# Patient Record
Sex: Male | Born: 1940 | Race: Asian | Hispanic: No | Marital: Married | Smoking: Never smoker
Health system: Southern US, Community
[De-identification: ages and names within clinical notes are randomized; demographics above are authoritative.]

## PROBLEM LIST (undated history)

## (undated) DIAGNOSIS — I10 Essential (primary) hypertension: Secondary | ICD-10-CM

---

## 2016-06-23 DIAGNOSIS — Z6821 Body mass index (BMI) 21.0-21.9, adult: Secondary | ICD-10-CM | POA: Diagnosis not present

## 2016-06-23 DIAGNOSIS — J309 Allergic rhinitis, unspecified: Secondary | ICD-10-CM | POA: Diagnosis not present

## 2016-06-23 DIAGNOSIS — E1165 Type 2 diabetes mellitus with hyperglycemia: Secondary | ICD-10-CM | POA: Diagnosis not present

## 2016-06-23 DIAGNOSIS — K219 Gastro-esophageal reflux disease without esophagitis: Secondary | ICD-10-CM | POA: Diagnosis not present

## 2016-06-23 DIAGNOSIS — I10 Essential (primary) hypertension: Secondary | ICD-10-CM | POA: Diagnosis not present

## 2016-06-26 DIAGNOSIS — Z1211 Encounter for screening for malignant neoplasm of colon: Secondary | ICD-10-CM | POA: Diagnosis not present

## 2016-06-26 DIAGNOSIS — Z6821 Body mass index (BMI) 21.0-21.9, adult: Secondary | ICD-10-CM | POA: Diagnosis not present

## 2016-06-26 DIAGNOSIS — Z789 Other specified health status: Secondary | ICD-10-CM | POA: Diagnosis not present

## 2016-06-26 DIAGNOSIS — Z7189 Other specified counseling: Secondary | ICD-10-CM | POA: Diagnosis not present

## 2016-06-26 DIAGNOSIS — R5383 Other fatigue: Secondary | ICD-10-CM | POA: Diagnosis not present

## 2016-06-26 DIAGNOSIS — Z0000 Encounter for general adult medical examination without abnormal findings: Secondary | ICD-10-CM | POA: Diagnosis not present

## 2016-06-26 DIAGNOSIS — Z1389 Encounter for screening for other disorder: Secondary | ICD-10-CM | POA: Diagnosis not present

## 2016-06-26 DIAGNOSIS — Z79899 Other long term (current) drug therapy: Secondary | ICD-10-CM | POA: Diagnosis not present

## 2016-06-26 DIAGNOSIS — E7800 Pure hypercholesterolemia, unspecified: Secondary | ICD-10-CM | POA: Diagnosis not present

## 2016-06-26 DIAGNOSIS — Z125 Encounter for screening for malignant neoplasm of prostate: Secondary | ICD-10-CM | POA: Diagnosis not present

## 2016-06-26 DIAGNOSIS — Z299 Encounter for prophylactic measures, unspecified: Secondary | ICD-10-CM | POA: Diagnosis not present

## 2016-12-28 DIAGNOSIS — K219 Gastro-esophageal reflux disease without esophagitis: Secondary | ICD-10-CM | POA: Diagnosis not present

## 2016-12-28 DIAGNOSIS — Z299 Encounter for prophylactic measures, unspecified: Secondary | ICD-10-CM | POA: Diagnosis not present

## 2016-12-28 DIAGNOSIS — I10 Essential (primary) hypertension: Secondary | ICD-10-CM | POA: Diagnosis not present

## 2016-12-28 DIAGNOSIS — D509 Iron deficiency anemia, unspecified: Secondary | ICD-10-CM | POA: Diagnosis not present

## 2016-12-28 DIAGNOSIS — E1165 Type 2 diabetes mellitus with hyperglycemia: Secondary | ICD-10-CM | POA: Diagnosis not present

## 2017-04-20 DIAGNOSIS — I10 Essential (primary) hypertension: Secondary | ICD-10-CM | POA: Diagnosis not present

## 2017-04-20 DIAGNOSIS — Z299 Encounter for prophylactic measures, unspecified: Secondary | ICD-10-CM | POA: Diagnosis not present

## 2017-04-20 DIAGNOSIS — E1165 Type 2 diabetes mellitus with hyperglycemia: Secondary | ICD-10-CM | POA: Diagnosis not present

## 2017-04-20 DIAGNOSIS — Z6822 Body mass index (BMI) 22.0-22.9, adult: Secondary | ICD-10-CM | POA: Diagnosis not present

## 2017-04-20 DIAGNOSIS — K219 Gastro-esophageal reflux disease without esophagitis: Secondary | ICD-10-CM | POA: Diagnosis not present

## 2017-04-20 DIAGNOSIS — E7800 Pure hypercholesterolemia, unspecified: Secondary | ICD-10-CM | POA: Diagnosis not present

## 2017-08-28 DIAGNOSIS — Z125 Encounter for screening for malignant neoplasm of prostate: Secondary | ICD-10-CM | POA: Diagnosis not present

## 2017-08-28 DIAGNOSIS — E1165 Type 2 diabetes mellitus with hyperglycemia: Secondary | ICD-10-CM | POA: Diagnosis not present

## 2017-08-28 DIAGNOSIS — Z299 Encounter for prophylactic measures, unspecified: Secondary | ICD-10-CM | POA: Diagnosis not present

## 2017-08-28 DIAGNOSIS — Z1211 Encounter for screening for malignant neoplasm of colon: Secondary | ICD-10-CM | POA: Diagnosis not present

## 2017-08-28 DIAGNOSIS — I10 Essential (primary) hypertension: Secondary | ICD-10-CM | POA: Diagnosis not present

## 2017-08-28 DIAGNOSIS — Z7189 Other specified counseling: Secondary | ICD-10-CM | POA: Diagnosis not present

## 2017-08-28 DIAGNOSIS — Z1389 Encounter for screening for other disorder: Secondary | ICD-10-CM | POA: Diagnosis not present

## 2017-08-28 DIAGNOSIS — Z0000 Encounter for general adult medical examination without abnormal findings: Secondary | ICD-10-CM | POA: Diagnosis not present

## 2017-08-28 DIAGNOSIS — R5383 Other fatigue: Secondary | ICD-10-CM | POA: Diagnosis not present

## 2017-08-28 DIAGNOSIS — Z79899 Other long term (current) drug therapy: Secondary | ICD-10-CM | POA: Diagnosis not present

## 2017-08-28 DIAGNOSIS — Z681 Body mass index (BMI) 19 or less, adult: Secondary | ICD-10-CM | POA: Diagnosis not present

## 2017-08-28 DIAGNOSIS — E7800 Pure hypercholesterolemia, unspecified: Secondary | ICD-10-CM | POA: Diagnosis not present

## 2018-04-19 DIAGNOSIS — Z299 Encounter for prophylactic measures, unspecified: Secondary | ICD-10-CM | POA: Diagnosis not present

## 2018-04-19 DIAGNOSIS — E1165 Type 2 diabetes mellitus with hyperglycemia: Secondary | ICD-10-CM | POA: Diagnosis not present

## 2018-04-19 DIAGNOSIS — Z681 Body mass index (BMI) 19 or less, adult: Secondary | ICD-10-CM | POA: Diagnosis not present

## 2018-04-19 DIAGNOSIS — Z789 Other specified health status: Secondary | ICD-10-CM | POA: Diagnosis not present

## 2018-04-19 DIAGNOSIS — I10 Essential (primary) hypertension: Secondary | ICD-10-CM | POA: Diagnosis not present

## 2018-04-19 DIAGNOSIS — J069 Acute upper respiratory infection, unspecified: Secondary | ICD-10-CM | POA: Diagnosis not present

## 2018-09-02 DIAGNOSIS — Z1211 Encounter for screening for malignant neoplasm of colon: Secondary | ICD-10-CM | POA: Diagnosis not present

## 2018-09-02 DIAGNOSIS — Z1331 Encounter for screening for depression: Secondary | ICD-10-CM | POA: Diagnosis not present

## 2018-09-02 DIAGNOSIS — Z299 Encounter for prophylactic measures, unspecified: Secondary | ICD-10-CM | POA: Diagnosis not present

## 2018-09-02 DIAGNOSIS — Z789 Other specified health status: Secondary | ICD-10-CM | POA: Diagnosis not present

## 2018-09-02 DIAGNOSIS — E7800 Pure hypercholesterolemia, unspecified: Secondary | ICD-10-CM | POA: Diagnosis not present

## 2018-09-02 DIAGNOSIS — Z1339 Encounter for screening examination for other mental health and behavioral disorders: Secondary | ICD-10-CM | POA: Diagnosis not present

## 2018-09-02 DIAGNOSIS — Z7189 Other specified counseling: Secondary | ICD-10-CM | POA: Diagnosis not present

## 2018-09-02 DIAGNOSIS — I10 Essential (primary) hypertension: Secondary | ICD-10-CM | POA: Diagnosis not present

## 2018-09-02 DIAGNOSIS — Z0000 Encounter for general adult medical examination without abnormal findings: Secondary | ICD-10-CM | POA: Diagnosis not present

## 2018-09-02 DIAGNOSIS — Z6821 Body mass index (BMI) 21.0-21.9, adult: Secondary | ICD-10-CM | POA: Diagnosis not present

## 2018-09-02 DIAGNOSIS — R5383 Other fatigue: Secondary | ICD-10-CM | POA: Diagnosis not present

## 2018-09-02 DIAGNOSIS — E1165 Type 2 diabetes mellitus with hyperglycemia: Secondary | ICD-10-CM | POA: Diagnosis not present

## 2018-09-04 DIAGNOSIS — Z299 Encounter for prophylactic measures, unspecified: Secondary | ICD-10-CM | POA: Diagnosis not present

## 2018-09-04 DIAGNOSIS — Z6820 Body mass index (BMI) 20.0-20.9, adult: Secondary | ICD-10-CM | POA: Diagnosis not present

## 2018-09-04 DIAGNOSIS — I10 Essential (primary) hypertension: Secondary | ICD-10-CM | POA: Diagnosis not present

## 2018-09-04 DIAGNOSIS — Z713 Dietary counseling and surveillance: Secondary | ICD-10-CM | POA: Diagnosis not present

## 2018-10-15 DIAGNOSIS — I10 Essential (primary) hypertension: Secondary | ICD-10-CM | POA: Diagnosis not present

## 2018-10-15 DIAGNOSIS — Z6820 Body mass index (BMI) 20.0-20.9, adult: Secondary | ICD-10-CM | POA: Diagnosis not present

## 2018-10-15 DIAGNOSIS — D509 Iron deficiency anemia, unspecified: Secondary | ICD-10-CM | POA: Diagnosis not present

## 2018-10-15 DIAGNOSIS — Z299 Encounter for prophylactic measures, unspecified: Secondary | ICD-10-CM | POA: Diagnosis not present

## 2018-10-15 DIAGNOSIS — E1165 Type 2 diabetes mellitus with hyperglycemia: Secondary | ICD-10-CM | POA: Diagnosis not present

## 2019-03-03 DIAGNOSIS — Z299 Encounter for prophylactic measures, unspecified: Secondary | ICD-10-CM | POA: Diagnosis not present

## 2019-03-03 DIAGNOSIS — R0600 Dyspnea, unspecified: Secondary | ICD-10-CM | POA: Diagnosis not present

## 2019-03-03 DIAGNOSIS — I10 Essential (primary) hypertension: Secondary | ICD-10-CM | POA: Diagnosis not present

## 2019-03-03 DIAGNOSIS — Z6821 Body mass index (BMI) 21.0-21.9, adult: Secondary | ICD-10-CM | POA: Diagnosis not present

## 2019-03-03 DIAGNOSIS — E1165 Type 2 diabetes mellitus with hyperglycemia: Secondary | ICD-10-CM | POA: Diagnosis not present

## 2019-03-03 DIAGNOSIS — R05 Cough: Secondary | ICD-10-CM | POA: Diagnosis not present

## 2019-03-10 DIAGNOSIS — R0602 Shortness of breath: Secondary | ICD-10-CM | POA: Diagnosis not present

## 2019-09-10 DIAGNOSIS — Z1339 Encounter for screening examination for other mental health and behavioral disorders: Secondary | ICD-10-CM | POA: Diagnosis not present

## 2019-09-10 DIAGNOSIS — I10 Essential (primary) hypertension: Secondary | ICD-10-CM | POA: Diagnosis not present

## 2019-09-10 DIAGNOSIS — Z0000 Encounter for general adult medical examination without abnormal findings: Secondary | ICD-10-CM | POA: Diagnosis not present

## 2019-09-10 DIAGNOSIS — R5383 Other fatigue: Secondary | ICD-10-CM | POA: Diagnosis not present

## 2019-09-10 DIAGNOSIS — Z7189 Other specified counseling: Secondary | ICD-10-CM | POA: Diagnosis not present

## 2019-09-10 DIAGNOSIS — Z6821 Body mass index (BMI) 21.0-21.9, adult: Secondary | ICD-10-CM | POA: Diagnosis not present

## 2019-09-10 DIAGNOSIS — Z125 Encounter for screening for malignant neoplasm of prostate: Secondary | ICD-10-CM | POA: Diagnosis not present

## 2019-09-10 DIAGNOSIS — E1165 Type 2 diabetes mellitus with hyperglycemia: Secondary | ICD-10-CM | POA: Diagnosis not present

## 2019-09-10 DIAGNOSIS — Z299 Encounter for prophylactic measures, unspecified: Secondary | ICD-10-CM | POA: Diagnosis not present

## 2019-09-10 DIAGNOSIS — E7800 Pure hypercholesterolemia, unspecified: Secondary | ICD-10-CM | POA: Diagnosis not present

## 2019-09-10 DIAGNOSIS — Z1331 Encounter for screening for depression: Secondary | ICD-10-CM | POA: Diagnosis not present

## 2019-09-10 DIAGNOSIS — Z1211 Encounter for screening for malignant neoplasm of colon: Secondary | ICD-10-CM | POA: Diagnosis not present

## 2019-09-10 DIAGNOSIS — Z79899 Other long term (current) drug therapy: Secondary | ICD-10-CM | POA: Diagnosis not present

## 2019-10-22 DIAGNOSIS — I10 Essential (primary) hypertension: Secondary | ICD-10-CM | POA: Diagnosis not present

## 2019-12-25 DIAGNOSIS — I10 Essential (primary) hypertension: Secondary | ICD-10-CM | POA: Diagnosis not present

## 2020-01-15 DIAGNOSIS — Z299 Encounter for prophylactic measures, unspecified: Secondary | ICD-10-CM | POA: Diagnosis not present

## 2020-01-15 DIAGNOSIS — R05 Cough: Secondary | ICD-10-CM | POA: Diagnosis not present

## 2020-01-15 DIAGNOSIS — Z6821 Body mass index (BMI) 21.0-21.9, adult: Secondary | ICD-10-CM | POA: Diagnosis not present

## 2020-01-15 DIAGNOSIS — Z20822 Contact with and (suspected) exposure to covid-19: Secondary | ICD-10-CM | POA: Diagnosis not present

## 2020-01-15 DIAGNOSIS — Z713 Dietary counseling and surveillance: Secondary | ICD-10-CM | POA: Diagnosis not present

## 2020-01-17 ENCOUNTER — Other Ambulatory Visit: Payer: Self-pay | Admitting: Physician Assistant

## 2020-01-17 ENCOUNTER — Telehealth: Payer: Self-pay | Admitting: Physician Assistant

## 2020-01-17 DIAGNOSIS — I10 Essential (primary) hypertension: Secondary | ICD-10-CM

## 2020-01-17 DIAGNOSIS — U071 COVID-19: Secondary | ICD-10-CM

## 2020-01-17 NOTE — Telephone Encounter (Signed)
  I connected by phone with Bruce Webb on 01/17/2020 at 3:22 PM to discuss the potential use of an new treatment for mild to moderate COVID-19 viral infection in non-hospitalized patients.  This patient is a 79 y.o. male that meets the FDA criteria for Emergency Use Authorization of bamlanivimab or casirivimab\imdevimab.  Has a (+) direct SARS-CoV-2 viral test result  Has mild or moderate COVID-19   Is ? 79 years of age and weighs ? 40 kg  Is NOT hospitalized due to COVID-19  Is NOT requiring oxygen therapy or requiring an increase in baseline oxygen flow rate due to COVID-19  Is within 10 days of symptom onset  Has at least one of the high risk factor(s) for progression to severe COVID-19 and/or hospitalization as defined in EUA.  Specific high risk criteria : >/= 78 yo  Hx of HTN   I have spoken and communicated the following to the patient or parent/caregiver:  1. FDA has authorized the emergency use of bamlanivimab and casirivimab\imdevimab for the treatment of mild to moderate COVID-19 in adults and pediatric patients with positive results of direct SARS-CoV-2 viral testing who are 14 years of age and older weighing at least 40 kg, and who are at high risk for progressing to severe COVID-19 and/or hospitalization.  2. The significant known and potential risks and benefits of bamlanivimab and casirivimab\imdevimab, and the extent to which such potential risks and benefits are unknown.  3. Information on available alternative treatments and the risks and benefits of those alternatives, including clinical trials.  4. Patients treated with bamlanivimab and casirivimab\imdevimab should continue to self-isolate and use infection control measures (e.g., wear mask, isolate, social distance, avoid sharing personal items, clean and disinfect "high touch" surfaces, and frequent handwashing) according to CDC guidelines.   5. The patient or parent/caregiver has the option to accept or  refuse bamlanivimab or casirivimab\imdevimab .  After reviewing this information with the patient, The patient agreed to proceed with receiving the bamlanimivab infusion and will be provided a copy of the Fact sheet prior to receiving the infusion.Bruce Webb 01/17/2020 3:22 PM

## 2020-01-19 ENCOUNTER — Ambulatory Visit (HOSPITAL_COMMUNITY)
Admission: RE | Admit: 2020-01-19 | Discharge: 2020-01-19 | Disposition: A | Discharge status: Home / Self Care | Payer: Medicare Other | Source: Ambulatory Visit | Attending: Pulmonary Disease | Admitting: Pulmonary Disease

## 2020-01-19 DIAGNOSIS — U071 COVID-19: Secondary | ICD-10-CM | POA: Insufficient documentation

## 2020-01-19 DIAGNOSIS — Z23 Encounter for immunization: Secondary | ICD-10-CM | POA: Insufficient documentation

## 2020-01-19 MED ORDER — ALBUTEROL SULFATE HFA 108 (90 BASE) MCG/ACT IN AERS: 2.0000 | INHALATION_SPRAY | Freq: Once | RESPIRATORY_TRACT | Status: DC | PRN

## 2020-01-19 MED ORDER — SODIUM CHLORIDE 0.9 % IV SOLN
700.0000 mg | Freq: Once | INTRAVENOUS | Status: AC
  Administered 2020-01-19: 700 mg via INTRAVENOUS
  Filled 2020-01-19: qty 20

## 2020-01-19 MED ORDER — FAMOTIDINE IN NACL 20-0.9 MG/50ML-% IV SOLN: 20.0000 mg | Freq: Once | INTRAVENOUS | Status: DC | PRN

## 2020-01-19 MED ORDER — SODIUM CHLORIDE 0.9 % IV SOLN
INTRAVENOUS | Status: DC | PRN
  Administered 2020-01-19: 250 mL via INTRAVENOUS

## 2020-01-19 MED ORDER — DIPHENHYDRAMINE HCL 50 MG/ML IJ SOLN: 50.0000 mg | Freq: Once | INTRAMUSCULAR | Status: DC | PRN

## 2020-01-19 MED ORDER — METHYLPREDNISOLONE SODIUM SUCC 125 MG IJ SOLR: 125.0000 mg | Freq: Once | INTRAMUSCULAR | Status: DC | PRN

## 2020-01-19 MED ORDER — EPINEPHRINE 0.3 MG/0.3ML IJ SOAJ: 0.3000 mg | Freq: Once | INTRAMUSCULAR | Status: DC | PRN

## 2020-01-19 NOTE — Progress Notes (Signed)
  Diagnosis: COVID-19  Physician: Dr. Delford Field  Procedure: [facility] Med: bamlanivimab infusion - Provided patient with bamlanimivab fact sheet for patients, parents and caregivers prior to infusion.  Complications: No immediate complications noted.  Discharge: Discharged home   Bruce Webb 01/19/2020

## 2020-01-19 NOTE — Discharge Instructions (Signed)
COVID-19 COVID-19 is a respiratory infection that is caused by a virus called severe acute respiratory syndrome coronavirus 2 (SARS-CoV-2). The disease is also known as coronavirus disease or novel coronavirus. In some people, the virus may not cause any symptoms. In others, it may cause a serious infection. The infection can get worse quickly and can lead to complications, such as:  Pneumonia, or infection of the lungs.  Acute respiratory distress syndrome or ARDS. This is a condition in which fluid build-up in the lungs prevents the lungs from filling with air and passing oxygen into the blood.  Acute respiratory failure. This is a condition in which there is not enough oxygen passing from the lungs to the body or when carbon dioxide is not passing from the lungs out of the body.  Sepsis or septic shock. This is a serious bodily reaction to an infection.  Blood clotting problems.  Secondary infections due to bacteria or fungus.  Organ failure. This is when your body's organs stop working. The virus that causes COVID-19 is contagious. This means that it can spread from person to person through droplets from coughs and sneezes (respiratory secretions). What are the causes? This illness is caused by a virus. You may catch the virus by:  Breathing in droplets from an infected person. Droplets can be spread by a person breathing, speaking, singing, coughing, or sneezing.  Touching something, like a table or a doorknob, that was exposed to the virus (contaminated) and then touching your mouth, nose, or eyes. What increases the risk? Risk for infection You are more likely to be infected with this virus if you:  Are within 6 feet (2 meters) of a person with COVID-19.  Provide care for or live with a person who is infected with COVID-19.  Spend time in crowded indoor spaces or live in shared housing. Risk for serious illness You are more likely to become seriously ill from the virus if  you:  Are 50 years of age or older. The higher your age, the more you are at risk for serious illness.  Live in a nursing home or long-term care facility.  Have cancer.  Have a long-term (chronic) disease such as: ? Chronic lung disease, including chronic obstructive pulmonary disease or asthma. ? A long-term disease that lowers your body's ability to fight infection (immunocompromised). ? Heart disease, including heart failure, a condition in which the arteries that lead to the heart become narrow or blocked (coronary artery disease), a disease which makes the heart muscle thick, weak, or stiff (cardiomyopathy). ? Diabetes. ? Chronic kidney disease. ? Sickle cell disease, a condition in which red blood cells have an abnormal "sickle" shape. ? Liver disease.  Are obese. What are the signs or symptoms? Symptoms of this condition can range from mild to severe. Symptoms may appear any time from 2 to 14 days after being exposed to the virus. They include:  A fever or chills.  A cough.  Difficulty breathing.  Headaches, body aches, or muscle aches.  Runny or stuffy (congested) nose.  A sore throat.  New loss of taste or smell. Some people may also have stomach problems, such as nausea, vomiting, or diarrhea. Other people may not have any symptoms of COVID-19. How is this diagnosed? This condition may be diagnosed based on:  Your signs and symptoms, especially if: ? You live in an area with a COVID-19 outbreak. ? You recently traveled to or from an area where the virus is common. ? You   provide care for or live with a person who was diagnosed with COVID-19. ? You were exposed to a person who was diagnosed with COVID-19.  A physical exam.  Lab tests, which may include: ? Taking a sample of fluid from the back of your nose and throat (nasopharyngeal fluid), your nose, or your throat using a swab. ? A sample of mucus from your lungs (sputum). ? Blood tests.  Imaging tests,  which may include, X-rays, CT scan, or ultrasound. How is this treated? At present, there is no medicine to treat COVID-19. Medicines that treat other diseases are being used on a trial basis to see if they are effective against COVID-19. Your health care provider will talk with you about ways to treat your symptoms. For most people, the infection is mild and can be managed at home with rest, fluids, and over-the-counter medicines. Treatment for a serious infection usually takes places in a hospital intensive care unit (ICU). It may include one or more of the following treatments. These treatments are given until your symptoms improve.  Receiving fluids and medicines through an IV.  Supplemental oxygen. Extra oxygen is given through a tube in the nose, a face mask, or a hood.  Positioning you to lie on your stomach (prone position). This makes it easier for oxygen to get into the lungs.  Continuous positive airway pressure (CPAP) or bi-level positive airway pressure (BPAP) machine. This treatment uses mild air pressure to keep the airways open. A tube that is connected to a motor delivers oxygen to the body.  Ventilator. This treatment moves air into and out of the lungs by using a tube that is placed in your windpipe.  Tracheostomy. This is a procedure to create a hole in the neck so that a breathing tube can be inserted.  Extracorporeal membrane oxygenation (ECMO). This procedure gives the lungs a chance to recover by taking over the functions of the heart and lungs. It supplies oxygen to the body and removes carbon dioxide. Follow these instructions at home: Lifestyle  If you are sick, stay home except to get medical care. Your health care provider will tell you how long to stay home. Call your health care provider before you go for medical care.  Rest at home as told by your health care provider.  Do not use any products that contain nicotine or tobacco, such as cigarettes,  e-cigarettes, and chewing tobacco. If you need help quitting, ask your health care provider.  Return to your normal activities as told by your health care provider. Ask your health care provider what activities are safe for you. General instructions  Take over-the-counter and prescription medicines only as told by your health care provider.  Drink enough fluid to keep your urine pale yellow.  Keep all follow-up visits as told by your health care provider. This is important. How is this prevented?  There is no vaccine to help prevent COVID-19 infection. However, there are steps you can take to protect yourself and others from this virus. To protect yourself:   Do not travel to areas where COVID-19 is a risk. The areas where COVID-19 is reported change often. To identify high-risk areas and travel restrictions, check the CDC travel website: wwwnc.cdc.gov/travel/notices  If you live in, or must travel to, an area where COVID-19 is a risk, take precautions to avoid infection. ? Stay away from people who are sick. ? Wash your hands often with soap and water for 20 seconds. If soap and water   are not available, use an alcohol-based hand sanitizer. ? Avoid touching your mouth, face, eyes, or nose. ? Avoid going out in public, follow guidance from your state and local health authorities. ? If you must go out in public, wear a cloth face covering or face mask. Make sure your mask covers your nose and mouth. ? Avoid crowded indoor spaces. Stay at least 6 feet (2 meters) away from others. ? Disinfect objects and surfaces that are frequently touched every day. This may include:  Counters and tables.  Doorknobs and light switches.  Sinks and faucets.  Electronics, such as phones, remote controls, keyboards, computers, and tablets. To protect others: If you have symptoms of COVID-19, take steps to prevent the virus from spreading to others.  If you think you have a COVID-19 infection, contact  your health care provider right away. Tell your health care team that you think you may have a COVID-19 infection.  Stay home. Leave your house only to seek medical care. Do not use public transport.  Do not travel while you are sick.  Wash your hands often with soap and water for 20 seconds. If soap and water are not available, use alcohol-based hand sanitizer.  Stay away from other members of your household. Let healthy household members care for children and pets, if possible. If you have to care for children or pets, wash your hands often and wear a mask. If possible, stay in your own room, separate from others. Use a different bathroom.  Make sure that all people in your household wash their hands well and often.  Cough or sneeze into a tissue or your sleeve or elbow. Do not cough or sneeze into your hand or into the air.  Wear a cloth face covering or face mask. Make sure your mask covers your nose and mouth. Where to find more information  Centers for Disease Control and Prevention: www.cdc.gov/coronavirus/2019-ncov/index.html  World Health Organization: www.who.int/health-topics/coronavirus Contact a health care provider if:  You live in or have traveled to an area where COVID-19 is a risk and you have symptoms of the infection.  You have had contact with someone who has COVID-19 and you have symptoms of the infection. Get help right away if:  You have trouble breathing.  You have pain or pressure in your chest.  You have confusion.  You have bluish lips and fingernails.  You have difficulty waking from sleep.  You have symptoms that get worse. These symptoms may represent a serious problem that is an emergency. Do not wait to see if the symptoms will go away. Get medical help right away. Call your local emergency services (911 in the [place] Do not drive yourself to the hospital. Let the emergency medical personnel know if you think you have  COVID-19. Summary  COVID-19 is a respiratory infection that is caused by a virus. It is also known as coronavirus disease or novel coronavirus. It can cause serious infections, such as pneumonia, acute respiratory distress syndrome, acute respiratory failure, or sepsis.  The virus that causes COVID-19 is contagious. This means that it can spread from person to person through droplets from breathing, speaking, singing, coughing, or sneezing.  You are more likely to develop a serious illness if you are 50 years of age or older, have a weak immune system, live in a nursing home, or have chronic disease.  There is no medicine to treat COVID-19. Your health care provider will talk with you about ways to treat your symptoms.    Take steps to protect yourself and others from infection. Wash your hands often and disinfect objects and surfaces that are frequently touched every day. Stay away from people who are sick and wear a mask if you are sick. This information is not intended to replace advice given to you by your health care provider. Make sure you discuss any questions you have with your health care provider. Document Revised: 10/10/2019 Document Reviewed: 01/16/2019 Elsevier Patient Education  2020 Elsevier Inc. What types of side effects do monoclonal antibody drugs cause?  Common side effects  In general, the more common side effects caused by monoclonal antibody drugs include: . Allergic reactions, such as hives or itching . Flu-like signs and symptoms, including chills, fatigue, fever, and muscle aches and pains . Nausea, vomiting . Diarrhea . Skin rashes . Low blood pressure   The CDC is recommending patients who receive monoclonal antibody treatments wait at least 90 days before being vaccinated.  Currently, there are no data on the safety and efficacy of mRNA COVID-19 vaccines in persons who received monoclonal antibodies or convalescent plasma as part of COVID-19 treatment. Based  on the estimated half-life of such therapies as well as evidence suggesting that reinfection is uncommon in the 90 days after initial infection, vaccination should be deferred for at least 90 days, as a precautionary measure until additional information becomes available, to avoid interference of the antibody treatment with vaccine-induced immune responses. 

## 2020-01-20 DIAGNOSIS — I10 Essential (primary) hypertension: Secondary | ICD-10-CM | POA: Diagnosis not present

## 2020-01-20 DIAGNOSIS — Z299 Encounter for prophylactic measures, unspecified: Secondary | ICD-10-CM | POA: Diagnosis not present

## 2020-01-20 DIAGNOSIS — U071 COVID-19: Secondary | ICD-10-CM | POA: Diagnosis not present

## 2020-01-20 DIAGNOSIS — Z6821 Body mass index (BMI) 21.0-21.9, adult: Secondary | ICD-10-CM | POA: Diagnosis not present

## 2020-01-20 DIAGNOSIS — E7800 Pure hypercholesterolemia, unspecified: Secondary | ICD-10-CM | POA: Diagnosis not present

## 2020-01-20 DIAGNOSIS — E1165 Type 2 diabetes mellitus with hyperglycemia: Secondary | ICD-10-CM | POA: Diagnosis not present

## 2020-01-21 ENCOUNTER — Ambulatory Visit (HOSPITAL_COMMUNITY): Payer: Medicare Other

## 2020-01-21 ENCOUNTER — Ambulatory Visit (HOSPITAL_COMMUNITY): Payer: Self-pay

## 2020-01-21 DIAGNOSIS — I10 Essential (primary) hypertension: Secondary | ICD-10-CM | POA: Diagnosis not present

## 2020-01-26 ENCOUNTER — Other Ambulatory Visit: Payer: Self-pay

## 2020-01-26 ENCOUNTER — Encounter (HOSPITAL_COMMUNITY): Payer: Self-pay

## 2020-01-26 ENCOUNTER — Emergency Department (HOSPITAL_COMMUNITY): Payer: Medicare Other

## 2020-01-26 ENCOUNTER — Inpatient Hospital Stay (HOSPITAL_COMMUNITY)
Admission: EM | Admit: 2020-01-26 | Discharge: 2020-01-31 | DRG: 177 | Disposition: A | Discharge status: Home / Self Care | Payer: Medicare Other | Attending: Internal Medicine | Admitting: Internal Medicine

## 2020-01-26 DIAGNOSIS — J9601 Acute respiratory failure with hypoxia: Secondary | ICD-10-CM

## 2020-01-26 DIAGNOSIS — U071 COVID-19: Secondary | ICD-10-CM | POA: Diagnosis not present

## 2020-01-26 DIAGNOSIS — E119 Type 2 diabetes mellitus without complications: Secondary | ICD-10-CM

## 2020-01-26 DIAGNOSIS — I10 Essential (primary) hypertension: Secondary | ICD-10-CM | POA: Diagnosis not present

## 2020-01-26 DIAGNOSIS — I2699 Other pulmonary embolism without acute cor pulmonale: Secondary | ICD-10-CM | POA: Diagnosis not present

## 2020-01-26 DIAGNOSIS — J1282 Pneumonia due to coronavirus disease 2019: Secondary | ICD-10-CM | POA: Diagnosis present

## 2020-01-26 DIAGNOSIS — K5900 Constipation, unspecified: Secondary | ICD-10-CM | POA: Diagnosis present

## 2020-01-26 DIAGNOSIS — Z79899 Other long term (current) drug therapy: Secondary | ICD-10-CM

## 2020-01-26 DIAGNOSIS — E611 Iron deficiency: Secondary | ICD-10-CM | POA: Diagnosis present

## 2020-01-26 DIAGNOSIS — E1165 Type 2 diabetes mellitus with hyperglycemia: Secondary | ICD-10-CM | POA: Diagnosis present

## 2020-01-26 DIAGNOSIS — D519 Vitamin B12 deficiency anemia, unspecified: Secondary | ICD-10-CM | POA: Diagnosis present

## 2020-01-26 DIAGNOSIS — R0602 Shortness of breath: Secondary | ICD-10-CM | POA: Diagnosis not present

## 2020-01-26 HISTORY — DX: Essential (primary) hypertension: I10

## 2020-01-26 LAB — CBC WITH DIFFERENTIAL/PLATELET
Abs Immature Granulocytes: 0.46 10*3/uL — ABNORMAL HIGH (ref 0.00–0.07)
Basophils Absolute: 0 10*3/uL (ref 0.0–0.1)
Basophils Relative: 0 %
Eosinophils Absolute: 0 10*3/uL (ref 0.0–0.5)
Eosinophils Relative: 0 %
HCT: 29.9 % — ABNORMAL LOW (ref 39.0–52.0)
Hemoglobin: 9.3 g/dL — ABNORMAL LOW (ref 13.0–17.0)
Immature Granulocytes: 3 %
Lymphocytes Relative: 4 %
Lymphs Abs: 0.6 10*3/uL — ABNORMAL LOW (ref 0.7–4.0)
MCH: 27.2 pg (ref 26.0–34.0)
MCHC: 31.1 g/dL (ref 30.0–36.0)
MCV: 87.4 fL (ref 80.0–100.0)
Monocytes Absolute: 1.1 10*3/uL — ABNORMAL HIGH (ref 0.1–1.0)
Monocytes Relative: 8 %
Neutro Abs: 11.9 10*3/uL — ABNORMAL HIGH (ref 1.7–7.7)
Neutrophils Relative %: 85 %
Platelets: 151 10*3/uL (ref 150–400)
RBC: 3.42 MIL/uL — ABNORMAL LOW (ref 4.22–5.81)
RDW: 14.3 % (ref 11.5–15.5)
WBC: 14.1 10*3/uL — ABNORMAL HIGH (ref 4.0–10.5)
nRBC: 0 % (ref 0.0–0.2)

## 2020-01-26 LAB — COMPREHENSIVE METABOLIC PANEL
ALT: 57 U/L — ABNORMAL HIGH (ref 0–44)
AST: 37 U/L (ref 15–41)
Albumin: 3 g/dL — ABNORMAL LOW (ref 3.5–5.0)
Alkaline Phosphatase: 79 U/L (ref 38–126)
Anion gap: 9 (ref 5–15)
BUN: 30 mg/dL — ABNORMAL HIGH (ref 8–23)
CO2: 24 mmol/L (ref 22–32)
Calcium: 8.6 mg/dL — ABNORMAL LOW (ref 8.9–10.3)
Chloride: 105 mmol/L (ref 98–111)
Creatinine, Ser: 1.07 mg/dL (ref 0.61–1.24)
GFR calc Af Amer: >60 mL/min (ref >60)
GFR calc non Af Amer: >60 mL/min (ref >60)
Glucose, Bld: 289 mg/dL — ABNORMAL HIGH (ref 70–99)
Potassium: 4.5 mmol/L (ref 3.5–5.1)
Sodium: 138 mmol/L (ref 135–145)
Total Bilirubin: 0.9 mg/dL (ref 0.3–1.2)
Total Protein: 7 g/dL (ref 6.5–8.1)

## 2020-01-26 LAB — FIBRINOGEN: Fibrinogen: 502 mg/dL — ABNORMAL HIGH (ref 210–475)

## 2020-01-26 LAB — POC SARS CORONAVIRUS 2 AG -  ED: SARS Coronavirus 2 Ag: POSITIVE — AB

## 2020-01-26 LAB — LACTATE DEHYDROGENASE: LDH: 314 U/L — ABNORMAL HIGH (ref 98–192)

## 2020-01-26 LAB — TRIGLYCERIDES: Triglycerides: 142 mg/dL (ref <150)

## 2020-01-26 MED ORDER — ACETAMINOPHEN 325 MG PO TABS
650.0000 mg | ORAL_TABLET | Freq: Once | ORAL | Status: DC
  Filled 2020-01-26 (×2): qty 2

## 2020-01-26 MED ORDER — DEXAMETHASONE SODIUM PHOSPHATE 10 MG/ML IJ SOLN
6.0000 mg | Freq: Once | INTRAMUSCULAR | Status: AC
  Administered 2020-01-26: 6 mg via INTRAVENOUS
  Filled 2020-01-26: qty 1

## 2020-01-26 MED ORDER — ALBUTEROL SULFATE HFA 108 (90 BASE) MCG/ACT IN AERS
2.0000 | INHALATION_SPRAY | Freq: Once | RESPIRATORY_TRACT | Status: AC
  Administered 2020-01-26: 2 via RESPIRATORY_TRACT
  Filled 2020-01-26: qty 6.7

## 2020-01-26 NOTE — ED Provider Notes (Signed)
[facility]-EMERGENCY DEPT Provider Note   CSN: [id number] Arrival date & time: 01/26/20  2025     History Chief Complaint  Patient presents with  . Shortness of Breath    Bruce Webb is a 79 y.o. male with a history of hypertension who was diagnosed with COVID 19 01/16/20 presents to the ED with complaints of worsening dyspnea. Patient states he has mostly had cough with fever & chills with his illness until the past few days when he has started to have progressively worsening dyspnea w/ generalized weakness. No alleviating/aggravating factors. 911 was called and noted patient to be hypoxic to 68% on RA improved w/ application of O2 via [place]. Patient denies chest pain, hemoptysis, vomiting, or diarrhea. Patient did receive monoclonal antibody infusion 01/19/2020. translator was utilized throughout Audiological scientist.  Patient son provided additional history. HPI     Past Medical History:  Diagnosis Date  . Hypertension     There are no problems to display for this patient.   History reviewed. No pertinent surgical history.     History reviewed. No pertinent family history.  Social History   Tobacco Use  . Smoking status: Not on file  Substance Use Topics  . Alcohol use: Not on file  . Drug use: Not on file    Home Medications Prior to Admission medications   Medication Sig Start Date End Date Taking? Authorizing Provider  acetaminophen (TYLENOL) 325 MG tablet Take 650 mg by mouth every 6 (six) hours as needed for mild pain or fever.   Yes [provider]  dexamethasone (DECADRON) 4 MG tablet Take 4 mg by mouth daily. x5days 01/23/20  Yes [provider]  HYDROcodone-homatropine (HYCODAN) 5-1.5 MG/5ML syrup Take 5 mLs by mouth 2 (two) times daily as needed for cough. 01/20/20  Yes [provider]  lisinopril (ZESTRIL) 10 MG tablet Take 10-20 mg by mouth See admin instructions. Take 20mg  in am & 10mg  in evening 11/25/19  Yes [provider]  metoprolol tartrate (LOPRESSOR) 50 MG tablet Take 50 mg by mouth daily.   Yes [provider]  omeprazole (PRILOSEC) 40 MG capsule Take 40 mg by mouth daily. 11/25/19  Yes [provider]    Allergies    Patient has no known allergies.  Review of Systems   Review of Systems  Constitutional: Positive for chills, fatigue and fever.  Respiratory: Positive for cough and shortness of breath.   Cardiovascular: Negative for chest pain.  Gastrointestinal: Negative for abdominal pain, diarrhea and vomiting.  Neurological: Positive for weakness (generalized).  All other systems reviewed and are negative.   Physical Exam Updated Vital Signs BP (!) 147/118   Pulse 73   Temp (!) 100.5 F (38.1 C) (Oral)   Resp (!) 21   Ht 5\' 2"  (1.575 m)   Wt 67.1 kg   SpO2 94%   BMI 27.07 kg/m   Physical Exam Vitals and nursing note reviewed.  Constitutional:      Appearance: He is ill-appearing. He is not toxic-appearing.  HENT:     Head: Normocephalic and atraumatic.  Eyes:     General:        Right eye: No discharge.        Left eye: No discharge.     Conjunctiva/sclera: Conjunctivae normal.  Cardiovascular:     Rate and Rhythm: Normal rate and regular rhythm.  Pulmonary:     Effort: Tachypnea present.     Breath sounds: No wheezing, rhonchi or  rales.     Comments: SpO2 88% on RA @ rest, improved w/ application of 2.5 L O2 via [place] Abdominal:     General: There is no distension.     Palpations: Abdomen is soft.     Tenderness: There is no abdominal tenderness.  Musculoskeletal:     Cervical back: Neck supple.     Right lower leg: No tenderness. No edema.     Left lower leg: No tenderness. No edema.  Skin:    General: Skin is warm and dry.     Findings: No rash.  Neurological:     Mental Status: He is alert.     Comments: Clear speech.   Psychiatric:        Behavior: Behavior normal.    ED Results / Procedures / Treatments   Labs (all labs  ordered are listed, but only abnormal results are displayed) Labs Reviewed  POC SARS CORONAVIRUS 2 AG -  ED - Abnormal; Notable for the following components:      Result Value   SARS Coronavirus 2 Ag POSITIVE (*)    All other components within normal limits  CULTURE, BLOOD (ROUTINE X 2)  CULTURE, BLOOD (ROUTINE X 2)  LACTIC ACID, PLASMA  LACTIC ACID, PLASMA  CBC WITH DIFFERENTIAL/PLATELET  COMPREHENSIVE METABOLIC PANEL  D-DIMER, QUANTITATIVE (NOT AT [facility])  PROCALCITONIN  LACTATE DEHYDROGENASE  FERRITIN  TRIGLYCERIDES  FIBRINOGEN  C-REACTIVE PROTEIN    EKG EKG Interpretation  Date/Time:  Monday January 26 2020 22:03:30 EST Ventricular Rate:  74 PR Interval:    QRS Duration: 93 QT Interval:  368 QTC Calculation: 409 R Axis:   17 Text Interpretation: Sinus rhythm RSR' in V1 or V2, right VCD or RVH Baseline wander in lead(s) V3 Confirmed by Virgel Manifold [phone]) on 01/26/2020 11:24:56 PM   Radiology DG Chest Port 1 View  Result Date: 01/26/2020 CLINICAL DATA:  79 year old male with shortness of breath. EXAM: PORTABLE CHEST 1 VIEW COMPARISON:  None. FINDINGS: Bilateral predominantly peripheral and subpleural interstitial coarsening and hazy densities concerning for infiltrate, possibly viral or atypical including COVID-19. Clinical correlation is recommended. No lobar consolidation, pleural effusion, or pneumothorax. The cardiac silhouette is within normal limits. No acute osseous pathology. IMPRESSION: Findings concerning for multifocal pneumonia, possibly viral or atypical in etiology. Clinical correlation and follow-up recommended. Electronically Signed   By: Anner Crete M.D.   On: 01/26/2020 22:17    Procedures .Critical Care Performed by: Amaryllis Dyke, PA-C Authorized by: Amaryllis Dyke, PA-C    CRITICAL CARE Performed by: Kennith Maes   Total critical care time: 35 minutes  Critical care time was exclusive of separately billable  procedures and treating other patients.  Critical care was necessary to treat or prevent imminent or life-threatening deterioration.  Critical care was time spent personally by me on the following activities: development of treatment plan with patient and/or surrogate as well as nursing, discussions with consultants, evaluation of patient's response to treatment, examination of patient, obtaining history from patient or surrogate, ordering and performing treatments and interventions, ordering and review of laboratory studies, ordering and review of radiographic studies, pulse oximetry and re-evaluation of patient's condition.    (including critical care time)  Medications Ordered in ED Medications  acetaminophen (TYLENOL) tablet 650 mg (0 mg Oral Hold 01/26/20 2154)  dexamethasone (DECADRON) injection 6 mg (6 mg Intravenous Given 01/26/20 2155)  albuterol (VENTOLIN HFA) 108 (90 Base) MCG/ACT inhaler 2 puff (2 puffs Inhalation Given 01/26/20 2157)  ED Course  I have reviewed the triage vital signs and the nursing notes.  Pertinent labs & imaging results that were available during my care of the patient were reviewed by me and considered in my medical decision making (see chart for details).    MDM Rules/Calculators/A&P                      Patient with known COVID-19 diagnosed 01/22 presents to the emergency department with worsening dyspnea.  He is ill-appearing, tachypneic, hypoxic on room air improved with 2.5 L of oxygen via nasal cannula,  febrile, BP somewhat elevated- doubt HTN emergency.  Lungs without obvious adventitious sounds.  Decadron, tylenol, albuterol ordered.   COVID test: Positive Chest x-ray: Multifocal pneumonia. EKG: No STEMI.  CBC: Leukocytosis @ 14.1. Anemia- no old labs on record for comparison, will need follow up.   CMP: Hyperglycemia w/o acidosis or anion gap elevation. Mildly elevated ALT. BUN elevated, creatinine WNL. Some inflammatory markers starting to  come back elevated.    Will consult hospitalist service for acute hypoxic respiratory failure in setting of COVID 19.  Patient & family updated on results & plan of care- in agreement.  Findings and plan of care discussed with supervising physician Dr. Juleen China who is in agreement.   00:18: CONSULT: Discussed with hospitalist Dr. Adela Glimpse- accepts admission.   Final Clinical Impression(s) / ED Diagnoses Final diagnoses:  COVID-19  Acute hypoxemic respiratory failure [facility])    Rx / DC Orders ED Discharge Orders    None       Desmond Lope 01/27/20 Maylon Peppers, MD 01/28/20 1510

## 2020-01-26 NOTE — ED Triage Notes (Signed)
Per EMS, patient from home and tested positive for covid 1/22 and his only symptom at the time was a cough. Covid symptoms progressed to fever, sob, however, cough has improved. Family states patient's o2 was 68%. EMS placed pt on 6L nasal cannula and o2 has been stable around 97%. Patient only speaks [place] - son was able to translate for EMS Bruce Webb [phone]).

## 2020-01-27 ENCOUNTER — Inpatient Hospital Stay (HOSPITAL_COMMUNITY): Payer: Medicare Other

## 2020-01-27 ENCOUNTER — Encounter (HOSPITAL_COMMUNITY): Payer: Self-pay | Admitting: Internal Medicine

## 2020-01-27 DIAGNOSIS — J1282 Pneumonia due to coronavirus disease 2019: Secondary | ICD-10-CM

## 2020-01-27 DIAGNOSIS — D519 Vitamin B12 deficiency anemia, unspecified: Secondary | ICD-10-CM | POA: Diagnosis present

## 2020-01-27 DIAGNOSIS — J9601 Acute respiratory failure with hypoxia: Secondary | ICD-10-CM | POA: Diagnosis present

## 2020-01-27 DIAGNOSIS — E119 Type 2 diabetes mellitus without complications: Secondary | ICD-10-CM

## 2020-01-27 DIAGNOSIS — U071 COVID-19: Principal | ICD-10-CM

## 2020-01-27 DIAGNOSIS — K5900 Constipation, unspecified: Secondary | ICD-10-CM | POA: Diagnosis present

## 2020-01-27 DIAGNOSIS — I10 Essential (primary) hypertension: Secondary | ICD-10-CM | POA: Diagnosis present

## 2020-01-27 DIAGNOSIS — J9600 Acute respiratory failure, unspecified whether with hypoxia or hypercapnia: Secondary | ICD-10-CM | POA: Diagnosis not present

## 2020-01-27 DIAGNOSIS — E1165 Type 2 diabetes mellitus with hyperglycemia: Secondary | ICD-10-CM | POA: Diagnosis present

## 2020-01-27 DIAGNOSIS — I2699 Other pulmonary embolism without acute cor pulmonale: Secondary | ICD-10-CM | POA: Diagnosis present

## 2020-01-27 DIAGNOSIS — E611 Iron deficiency: Secondary | ICD-10-CM | POA: Diagnosis present

## 2020-01-27 DIAGNOSIS — Z79899 Other long term (current) drug therapy: Secondary | ICD-10-CM | POA: Diagnosis not present

## 2020-01-27 LAB — COMPREHENSIVE METABOLIC PANEL
ALT: 45 U/L — ABNORMAL HIGH (ref 0–44)
AST: 29 U/L (ref 15–41)
Albumin: 2.6 g/dL — ABNORMAL LOW (ref 3.5–5.0)
Alkaline Phosphatase: 67 U/L (ref 38–126)
Anion gap: 9 (ref 5–15)
BUN: 29 mg/dL — ABNORMAL HIGH (ref 8–23)
CO2: 22 mmol/L (ref 22–32)
Calcium: 8 mg/dL — ABNORMAL LOW (ref 8.9–10.3)
Chloride: 104 mmol/L (ref 98–111)
Creatinine, Ser: 0.95 mg/dL (ref 0.61–1.24)
GFR calc Af Amer: >60 mL/min (ref >60)
GFR calc non Af Amer: >60 mL/min (ref >60)
Glucose, Bld: 283 mg/dL — ABNORMAL HIGH (ref 70–99)
Potassium: 4.4 mmol/L (ref 3.5–5.1)
Sodium: 135 mmol/L (ref 135–145)
Total Bilirubin: 0.5 mg/dL (ref 0.3–1.2)
Total Protein: 6.3 g/dL — ABNORMAL LOW (ref 6.5–8.1)

## 2020-01-27 LAB — TROPONIN I (HIGH SENSITIVITY)
Troponin I (High Sensitivity): 34 ng/L — ABNORMAL HIGH (ref <18)
Troponin I (High Sensitivity): 27 ng/L — ABNORMAL HIGH (ref <18)

## 2020-01-27 LAB — GLUCOSE, CAPILLARY
Glucose-Capillary: 258 mg/dL — ABNORMAL HIGH (ref 70–99)
Glucose-Capillary: 286 mg/dL — ABNORMAL HIGH (ref 70–99)
Glucose-Capillary: 318 mg/dL — ABNORMAL HIGH (ref 70–99)
Glucose-Capillary: 353 mg/dL — ABNORMAL HIGH (ref 70–99)

## 2020-01-27 LAB — RETICULOCYTES
Immature Retic Fract: 15.9 % (ref 2.3–15.9)
RBC.: 3.47 MIL/uL — ABNORMAL LOW (ref 4.22–5.81)
Retic Count, Absolute: 46.2 10*3/uL (ref 19.0–186.0)
Retic Ct Pct: 1.3 % (ref 0.4–3.1)

## 2020-01-27 LAB — CBC WITH DIFFERENTIAL/PLATELET
Abs Immature Granulocytes: 0.39 10*3/uL — ABNORMAL HIGH (ref 0.00–0.07)
Basophils Absolute: 0 10*3/uL (ref 0.0–0.1)
Basophils Relative: 0 %
Eosinophils Absolute: 0 10*3/uL (ref 0.0–0.5)
Eosinophils Relative: 0 %
HCT: 29.5 % — ABNORMAL LOW (ref 39.0–52.0)
Hemoglobin: 9.5 g/dL — ABNORMAL LOW (ref 13.0–17.0)
Immature Granulocytes: 3 %
Lymphocytes Relative: 2 %
Lymphs Abs: 0.3 10*3/uL — ABNORMAL LOW (ref 0.7–4.0)
MCH: 27.8 pg (ref 26.0–34.0)
MCHC: 32.2 g/dL (ref 30.0–36.0)
MCV: 86.3 fL (ref 80.0–100.0)
Monocytes Absolute: 0.4 10*3/uL (ref 0.1–1.0)
Monocytes Relative: 3 %
Neutro Abs: 11.3 10*3/uL — ABNORMAL HIGH (ref 1.7–7.7)
Neutrophils Relative %: 92 %
Platelets: 153 10*3/uL (ref 150–400)
RBC: 3.42 MIL/uL — ABNORMAL LOW (ref 4.22–5.81)
RDW: 14.6 % (ref 11.5–15.5)
WBC: 12.3 10*3/uL — ABNORMAL HIGH (ref 4.0–10.5)
nRBC: 0 % (ref 0.0–0.2)

## 2020-01-27 LAB — IRON AND TIBC
Iron: 17 ug/dL — ABNORMAL LOW (ref 45–182)
Saturation Ratios: 7 % — ABNORMAL LOW (ref 17.9–39.5)
TIBC: 242 ug/dL — ABNORMAL LOW (ref 250–450)
UIBC: 225 ug/dL

## 2020-01-27 LAB — LACTIC ACID, PLASMA
Lactic Acid, Venous: 2.1 mmol/L (ref 0.5–1.9)
Lactic Acid, Venous: 1.7 mmol/L (ref 0.5–1.9)

## 2020-01-27 LAB — D-DIMER, QUANTITATIVE
D-Dimer, Quant: >20 ug/mL-FEU — ABNORMAL HIGH (ref 0.00–0.50)
D-Dimer, Quant: >20 ug/mL-FEU — ABNORMAL HIGH (ref 0.00–0.50)

## 2020-01-27 LAB — PROCALCITONIN: Procalcitonin: <0.1 ng/mL

## 2020-01-27 LAB — VITAMIN B12: Vitamin B-12: 165 pg/mL — ABNORMAL LOW (ref 180–914)

## 2020-01-27 LAB — SAMPLE TO BLOOD BANK

## 2020-01-27 LAB — FERRITIN
Ferritin: 142 ng/mL (ref 24–336)
Ferritin: 134 ng/mL (ref 24–336)

## 2020-01-27 LAB — C-REACTIVE PROTEIN
CRP: 10.6 mg/dL — ABNORMAL HIGH (ref <1.0)
CRP: 14 mg/dL — ABNORMAL HIGH (ref <1.0)

## 2020-01-27 LAB — FOLATE: Folate: 13.5 ng/mL (ref >5.9)

## 2020-01-27 MED ORDER — SODIUM CHLORIDE (PF) 0.9 % IJ SOLN
INTRAMUSCULAR | Status: AC
  Filled 2020-01-27: qty 50

## 2020-01-27 MED ORDER — ENOXAPARIN SODIUM 40 MG/0.4ML ~~LOC~~ SOLN: 40.0000 mg | SUBCUTANEOUS | Status: DC

## 2020-01-27 MED ORDER — INSULIN DETEMIR 100 UNIT/ML ~~LOC~~ SOLN
0.0750 [IU]/kg | Freq: Two times a day (BID) | SUBCUTANEOUS | Status: DC
  Administered 2020-01-27 – 2020-01-31 (×9): 5 [IU] via SUBCUTANEOUS
  Filled 2020-01-27 (×10): qty 0.05

## 2020-01-27 MED ORDER — ENOXAPARIN SODIUM 80 MG/0.8ML ~~LOC~~ SOLN
65.0000 mg | Freq: Two times a day (BID) | SUBCUTANEOUS | Status: DC
  Administered 2020-01-27 – 2020-01-29 (×4): 65 mg via SUBCUTANEOUS
  Filled 2020-01-27 (×4): qty 0.8

## 2020-01-27 MED ORDER — INSULIN ASPART 100 UNIT/ML ~~LOC~~ SOLN
0.0000 [IU] | SUBCUTANEOUS | Status: DC
  Administered 2020-01-27: 09:00:00 8 [IU] via SUBCUTANEOUS
  Administered 2020-01-27: 19:00:00 11 [IU] via SUBCUTANEOUS
  Administered 2020-01-27: 12:00:00 8 [IU] via SUBCUTANEOUS
  Administered 2020-01-27: 11 [IU] via SUBCUTANEOUS
  Administered 2020-01-28: 8 [IU] via SUBCUTANEOUS
  Administered 2020-01-28: 2 [IU] via SUBCUTANEOUS
  Administered 2020-01-28: 11 [IU] via SUBCUTANEOUS
  Administered 2020-01-28 – 2020-01-29 (×2): 5 [IU] via SUBCUTANEOUS
  Administered 2020-01-29 – 2020-01-30 (×3): 8 [IU] via SUBCUTANEOUS
  Administered 2020-01-30: 5 [IU] via SUBCUTANEOUS
  Administered 2020-01-30: 01:00:00 2 [IU] via SUBCUTANEOUS
  Administered 2020-01-30: 12:00:00 5 [IU] via SUBCUTANEOUS
  Administered 2020-01-31: 13:00:00 11 [IU] via SUBCUTANEOUS

## 2020-01-27 MED ORDER — ONDANSETRON HCL 4 MG/2ML IJ SOLN: 4.0000 mg | Freq: Four times a day (QID) | INTRAMUSCULAR | Status: DC | PRN

## 2020-01-27 MED ORDER — SODIUM CHLORIDE 0.9 % IV SOLN: 100.0000 mg | Freq: Every day | INTRAVENOUS | Status: DC

## 2020-01-27 MED ORDER — ASCORBIC ACID 500 MG PO TABS
500.0000 mg | ORAL_TABLET | Freq: Every day | ORAL | Status: DC
  Administered 2020-01-27 – 2020-01-31 (×5): 500 mg via ORAL
  Filled 2020-01-27 (×5): qty 1

## 2020-01-27 MED ORDER — ENOXAPARIN SODIUM 80 MG/0.8ML ~~LOC~~ SOLN
1.0000 mg/kg | SUBCUTANEOUS | Status: AC
  Administered 2020-01-27: 09:00:00 65 mg via SUBCUTANEOUS
  Filled 2020-01-27: qty 0.8

## 2020-01-27 MED ORDER — HYDROCODONE-HOMATROPINE 5-1.5 MG/5ML PO SYRP
5.0000 mL | ORAL_SOLUTION | Freq: Two times a day (BID) | ORAL | Status: DC | PRN
  Administered 2020-01-28: 5 mL via ORAL
  Filled 2020-01-27: qty 5

## 2020-01-27 MED ORDER — ENOXAPARIN SODIUM 80 MG/0.8ML ~~LOC~~ SOLN: 65.0000 mg | SUBCUTANEOUS | Status: DC

## 2020-01-27 MED ORDER — LISINOPRIL 10 MG PO TABS
10.0000 mg | ORAL_TABLET | Freq: Every day | ORAL | Status: DC
  Administered 2020-01-27 – 2020-01-29 (×3): 10 mg via ORAL
  Filled 2020-01-27 (×5): qty 1

## 2020-01-27 MED ORDER — IOHEXOL 350 MG/ML SOLN
100.0000 mL | Freq: Once | INTRAVENOUS | Status: AC | PRN
  Administered 2020-01-27: 100 mL via INTRAVENOUS

## 2020-01-27 MED ORDER — METOPROLOL TARTRATE 25 MG PO TABS
12.5000 mg | ORAL_TABLET | Freq: Two times a day (BID) | ORAL | Status: DC
  Administered 2020-01-27 – 2020-01-30 (×7): 12.5 mg via ORAL
  Filled 2020-01-27 (×9): qty 1

## 2020-01-27 MED ORDER — HYDROCODONE-ACETAMINOPHEN 5-325 MG PO TABS: 1.0000 | ORAL_TABLET | ORAL | Status: DC | PRN

## 2020-01-27 MED ORDER — METHYLPREDNISOLONE SODIUM SUCC 40 MG IJ SOLR: 0.2500 mg/kg | Freq: Two times a day (BID) | INTRAMUSCULAR | Status: DC

## 2020-01-27 MED ORDER — SODIUM CHLORIDE 0.9 % IV SOLN: 200.0000 mg | Freq: Once | INTRAVENOUS | Status: DC

## 2020-01-27 MED ORDER — PANTOPRAZOLE SODIUM 40 MG PO TBEC
40.0000 mg | DELAYED_RELEASE_TABLET | Freq: Every day | ORAL | Status: DC
  Administered 2020-01-27 – 2020-01-31 (×5): 40 mg via ORAL
  Filled 2020-01-27 (×5): qty 1

## 2020-01-27 MED ORDER — SODIUM CHLORIDE 0.9 % IV SOLN
200.0000 mg | Freq: Once | INTRAVENOUS | Status: AC
  Administered 2020-01-27: 02:00:00 200 mg via INTRAVENOUS
  Filled 2020-01-27: qty 40

## 2020-01-27 MED ORDER — SODIUM CHLORIDE 0.9 % IV SOLN: 250.0000 mL | INTRAVENOUS | Status: DC | PRN

## 2020-01-27 MED ORDER — ZINC SULFATE 220 (50 ZN) MG PO CAPS
220.0000 mg | ORAL_CAPSULE | Freq: Every day | ORAL | Status: DC
  Administered 2020-01-27 – 2020-01-31 (×5): 220 mg via ORAL
  Filled 2020-01-27 (×5): qty 1

## 2020-01-27 MED ORDER — ACETAMINOPHEN 325 MG PO TABS: 650.0000 mg | ORAL_TABLET | Freq: Four times a day (QID) | ORAL | Status: DC | PRN

## 2020-01-27 MED ORDER — ONDANSETRON HCL 4 MG PO TABS: 4.0000 mg | ORAL_TABLET | Freq: Four times a day (QID) | ORAL | Status: DC | PRN

## 2020-01-27 MED ORDER — SODIUM CHLORIDE 0.9% FLUSH
3.0000 mL | Freq: Two times a day (BID) | INTRAVENOUS | Status: DC
  Administered 2020-01-27 – 2020-01-31 (×6): 3 mL via INTRAVENOUS

## 2020-01-27 MED ORDER — DEXAMETHASONE SODIUM PHOSPHATE 10 MG/ML IJ SOLN
6.0000 mg | Freq: Every day | INTRAMUSCULAR | Status: DC
  Administered 2020-01-27 – 2020-01-31 (×5): 6 mg via INTRAVENOUS
  Filled 2020-01-27 (×5): qty 1

## 2020-01-27 MED ORDER — SODIUM CHLORIDE 0.9% FLUSH: 3.0000 mL | INTRAVENOUS | Status: DC | PRN

## 2020-01-27 MED ORDER — CYANOCOBALAMIN 1000 MCG/ML IJ SOLN
1000.0000 ug | INTRAMUSCULAR | Status: DC
  Administered 2020-01-30: 1000 ug via INTRAMUSCULAR
  Filled 2020-01-27: qty 1

## 2020-01-27 MED ORDER — SODIUM CHLORIDE 0.9 % IV SOLN
100.0000 mg | Freq: Every day | INTRAVENOUS | Status: AC
  Administered 2020-01-28 – 2020-01-31 (×4): 100 mg via INTRAVENOUS
  Filled 2020-01-27 (×4): qty 20

## 2020-01-27 MED ORDER — SODIUM CHLORIDE 0.9% FLUSH
3.0000 mL | Freq: Two times a day (BID) | INTRAVENOUS | Status: DC
  Administered 2020-01-27 – 2020-01-31 (×9): 3 mL via INTRAVENOUS

## 2020-01-27 MED ORDER — LINAGLIPTIN 5 MG PO TABS
5.0000 mg | ORAL_TABLET | Freq: Every day | ORAL | Status: DC
  Administered 2020-01-27 – 2020-01-31 (×5): 5 mg via ORAL
  Filled 2020-01-27 (×5): qty 1

## 2020-01-27 MED ORDER — CYANOCOBALAMIN 1000 MCG/ML IJ SOLN
1000.0000 ug | Freq: Every day | INTRAMUSCULAR | Status: AC
  Administered 2020-01-27 – 2020-01-29 (×3): 1000 ug via INTRAMUSCULAR
  Filled 2020-01-27 (×3): qty 1

## 2020-01-27 NOTE — Plan of Care (Signed)
  Problem: Education: Goal: Knowledge of risk factors and measures for prevention of condition will improve Outcome: Progressing   

## 2020-01-27 NOTE — Progress Notes (Signed)
PROGRESS NOTE                                                                                                                                                                                                             Patient Demographics:    Bruce Webb, is a 79 y.o. male, DOB - 1941/10/24, EXB:[id number]  Admit date - 01/26/2020   Admitting Physician Therisa Doyne, MD  Outpatient Primary MD for the patient is Ignatius Specking, MD  LOS - 0   Chief Complaint  Patient presents with  . Shortness of Breath       Brief Narrative    This is a no charge note, it was seen and admitted earlier today by Dr. Adela Glimpse, chart, imaging, and labs were reviewed  Bruce Webb is a 79 y.o. male with medical history significant of recent Covid infection DM2, HTN, Presented with  Severe hypoxia, Initially diagnosed on 1/22 with  COVID , bamlanivimab infusion was given at [facility] on 01/23/2020 was started on steroids but continued to have worsening shortness of breath.    Noted to be hypoxic by EMS 68% on room air, was 80% on room air in ED, was admitted to [facility] for further management .   Subjective:    [name] today has complaints of some cough .    Assessment  & Plan :    Active Problems:   Pneumonia due to COVID-19 virus   Acute respiratory failure due to COVID-19 [facility])   Essential hypertension   DM (diabetes mellitus), type 2 (HCC)  Acute hypoxic respiratory failure due to COVID-19 pneumonia: -On 2.5 L nasal cannula, saturating 97%, initially 80% on room air.  Chest x-ray concerning for multifocal opacities. -Patient was encouraged with incentive spirometry, flutter valve, out of bed to chair, and to prone once back in bed. -Continue with IV Decadron. -Continue with IV remdesivir. -Patient received bamlanivimab infusion at [facility] on 01/23/2020(outpatient) -Far I see no indication for Actemra, or convalescent plasma, given his oxygen requirement, but  low threshold to give Actemra especially with elevated CRP at 14.  COVID-19 Labs  Recent Labs    01/26/20 2151 01/27/20 0419  DDIMER >20.00* >20.00*  FERRITIN 142 134  LDH 314*  --   CRP 10.6* 14.0*    No results found for: SARSCOV2NAA  Pulmonary embolism -It  was noted to have significantly elevated D-dimers, CTA chest significant for subacute PE, with no evidence of right heart strain, for now we will continue with Lovenox full treatment dose, will transition to DOAC once more stable.  Anemia/B12 deficiency -Secondary to B12 deficiency. -Noted on B12 supplements  Essential hypertension -Overall blood pressure is acceptable, I will go ahead resume on lower dose metoprolol 12.5 mg p.o. twice daily, and lower dose lisinopril as well.  Diabetes mellitus type II -Does not appear to be on any home medication, he started on Tradjenta given COVID-19 positive status, started on low-dose Levemir, and insulin sliding scale  Code Status : Full  Family Communication  : Cussed with his son, and his nephew(Bruce Webb [phone]), family want nephew to be point of contact for updates.  Disposition Plan  : Home when stable  Barriers For Discharge : he remains on IV steroids, Remdesivir, and still requiring Ixygen  Consults  :  None  Procedures  : None  DVT Prophylaxis  :  Lovenox full dose  Lab Results  Component Value Date   PLT 153 01/27/2020    Antibiotics  :    Anti-infectives (From admission, onward)   Start     Dose/Rate Route Frequency Ordered Stop   01/28/20 1000  remdesivir 100 mg in sodium chloride 0.9 % 100 mL IVPB  Status:  Discontinued     100 mg 200 mL/hr over 30 Minutes Intravenous Daily 01/27/20 0601 01/27/20 0611   01/28/20 1000  remdesivir 100 mg in sodium chloride 0.9 % 100 mL IVPB     100 mg 200 mL/hr over 30 Minutes Intravenous Daily 01/27/20 0048 02/01/20 0959   01/27/20 0601  remdesivir 200 mg in sodium chloride 0.9% 250 mL IVPB  Status:   Discontinued     200 mg 580 mL/hr over 30 Minutes Intravenous Once 01/27/20 0601 01/27/20 0611   01/27/20 0115  remdesivir 200 mg in sodium chloride 0.9% 250 mL IVPB     200 mg 580 mL/hr over 30 Minutes Intravenous Once 01/27/20 0048 01/27/20 0242        Objective:   Vitals:   01/27/20 0400 01/27/20 0500 01/27/20 0600 01/27/20 0718  BP: 134/73   (!) 146/68  Pulse: 61 (!) 49 (!) 49 89  Resp: 20 16 19 20   Temp:    97.8 F (36.6 C)  TempSrc:    Oral  SpO2: 96% 97% 96% 94%  Weight:      Height:        Wt Readings from Last 3 Encounters:  01/26/20 67.1 kg     Intake/Output Summary (Last 24 hours) at 01/27/2020 1141 Last data filed at 01/27/2020 1127 Gross per 24 hour  Intake 250 ml  Output 200 ml  Net 50 ml     Physical Exam  Awake Alert, Oriented X 3, No new F.N deficits, Normal affect Symmetrical Chest wall movement, Good air movement bilaterally, CTAB RRR,No Gallops,Rubs or new Murmurs, No Parasternal Heave +ve B.Sounds, Abd Soft, No tenderness, No rebound - guarding or rigidity. No Cyanosis, Clubbing or edema, No new Rash or bruise      Data Review:    CBC Recent Labs  Lab 01/26/20 2151 01/27/20 0419  WBC 14.1* 12.3*  HGB 9.3* 9.5*  HCT 29.9* 29.5*  PLT 151 153  MCV 87.4 86.3  MCH 27.2 27.8  MCHC 31.1 32.2  RDW 14.3 14.6  LYMPHSABS 0.6* 0.3*  MONOABS 1.1* 0.4  EOSABS 0.0 0.0  BASOSABS 0.0 0.0  Chemistries  Recent Labs  Lab 01/26/20 2151 01/27/20 0419  NA 138 135  K 4.5 4.4  CL 105 104  CO2 24 22  GLUCOSE 289* 283*  BUN 30* 29*  CREATININE 1.07 0.95  CALCIUM 8.6* 8.0*  AST 37 29  ALT 57* 45*  ALKPHOS 79 67  BILITOT 0.9 0.5   ------------------------------------------------------------------------------------------------------------------ Recent Labs    01/26/20 2151  TRIG 142    No results found for: HGBA1C ------------------------------------------------------------------------------------------------------------------ No  results for input(s): TSH, T4TOTAL, T3FREE, THYROIDAB in the last 72 hours.  Invalid input(s): FREET3 ------------------------------------------------------------------------------------------------------------------ Recent Labs    01/26/20 2151 01/27/20 0419  VITAMINB12  --  165*  FOLATE  --  13.5  FERRITIN 142 134  TIBC  --  242*  IRON  --  17*  RETICCTPCT  --  1.3    Coagulation profile No results for input(s): INR, PROTIME in the last 168 hours.  Recent Labs    01/26/20 2151 01/27/20 0419  DDIMER >20.00* >20.00*    Cardiac Enzymes No results for input(s): CKMB, TROPONINI, MYOGLOBIN in the last 168 hours.  Invalid input(s): CK ------------------------------------------------------------------------------------------------------------------ No results found for: BNP  Inpatient Medications  Scheduled Meds: . acetaminophen  650 mg Oral Once  . vitamin C  500 mg Oral Daily  . cyanocobalamin  1,000 mcg Intramuscular Daily   Followed by  . [START ON 01/30/2020] cyanocobalamin  1,000 mcg Intramuscular Weekly  . dexamethasone (DECADRON) injection  6 mg Intravenous Daily  . enoxaparin (LOVENOX) injection  65 mg Subcutaneous Q12H  . insulin aspart  0-15 Units Subcutaneous Q4H  . insulin detemir  0.075 Units/kg Subcutaneous BID  . linagliptin  5 mg Oral Daily  . pantoprazole  40 mg Oral Daily  . sodium chloride (PF)      . sodium chloride flush  3 mL Intravenous Q12H  . sodium chloride flush  3 mL Intravenous Q12H  . zinc sulfate  220 mg Oral Daily   Continuous Infusions: . sodium chloride    . [START ON 01/28/2020] remdesivir 100 mg in NS 100 mL     PRN Meds:.sodium chloride, acetaminophen, HYDROcodone-acetaminophen, HYDROcodone-homatropine, ondansetron **OR** ondansetron (ZOFRAN) IV, sodium chloride flush  Micro Results Recent Results (from the past 240 hour(s))  Blood Culture (routine x 2)     Status: None (Preliminary result)   Collection Time: 01/26/20  9:51 PM     Specimen: BLOOD  Result Value Ref Range Status   Specimen Description BLOOD RIGHT ANTECUBITAL  Final   Special Requests   Final    BOTTLES DRAWN AEROBIC AND ANAEROBIC Blood Culture adequate volume Performed at [facility], [address], [place], [place] [address]    Culture NO GROWTH < 12 HOURS  Final   Report Status PENDING  Incomplete  Blood Culture (routine x 2)     Status: None (Preliminary result)   Collection Time: 01/27/20  1:18 AM   Specimen: BLOOD  Result Value Ref Range Status   Specimen Description BLOOD BLOOD LEFT FOREARM  Final   Special Requests   Final    BOTTLES DRAWN AEROBIC AND ANAEROBIC Blood Culture results may not be optimal due to an excessive volume of blood received in culture bottles Performed at [facility], [address] [place], [place] [address]    Culture NO GROWTH <12 HOURS  Final   Report Status PENDING  Incomplete    Radiology Reports CT ANGIO CHEST PE W OR WO CONTRAST  Result Date: 01/27/2020  CLINICAL DATA:  COVID-19 positivity with increased cough EXAM: CT ANGIOGRAPHY CHEST WITH CONTRAST TECHNIQUE: Multidetector CT imaging of the chest was performed using the standard protocol during bolus administration of intravenous contrast. Multiplanar CT image reconstructions and MIPs were obtained to evaluate the vascular anatomy. CONTRAST:  OMNIPAQUE IOHEXOL 350 MG/ML SOLN COMPARISON:  None. FINDINGS: Cardiovascular: Thoracic aorta is within normal limits within normal branching pattern. No cardiac enlargement is seen. Scattered small filling defects are noted within the pulmonary artery on the right. No right heart strain is noted. 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 Mediastinum/Nodes: Thoracic inlet is within normal limits. No hilar or mediastinal adenopathy is noted. The esophagus as visualized is within limits. Lungs/Pleura: Lungs are well aerated bilaterally with predominantly  peripheral ground-glass opacities consistent with given clinical history of COVID-19 positivity trees no effusion is seen. No parenchymal nodules noted. Upper Abdomen: Visualized upper abdomen is within normal limits. Musculoskeletal: Degenerative changes of the thoracic spine are seen. No acute bony abnormality is noted. Review of the MIP images confirms the above findings. 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 No right heart strain is noted. Bilateral ground-glass opacities consistent with the given clinical history of COVID-19 positivity. Electronically Signed   By: Alcide Clever M.D.   On: 01/27/2020 02:14   DG Chest Port 1 View  Result Date: 01/26/2020 CLINICAL DATA:  79 year old male with shortness of breath. EXAM: PORTABLE CHEST 1 VIEW COMPARISON:  None. FINDINGS: Bilateral predominantly peripheral and subpleural interstitial coarsening and hazy densities concerning for infiltrate, possibly viral or atypical including COVID-19. Clinical correlation is recommended. No lobar consolidation, pleural effusion, or pneumothorax. The cardiac silhouette is within normal limits. No acute osseous pathology. IMPRESSION: Findings concerning for multifocal pneumonia, possibly viral or atypical in etiology. Clinical correlation and follow-up recommended. Electronically Signed   By: Elgie Collard M.D.   On: 01/26/2020 22:17     Huey Bienenstock M.D on 01/27/2020 at 11:41 AM  Between 7am to 7pm - Pager - [phone]  After 7pm go to [email or web address] - password [facility]  [facility] -  Office  [phone]

## 2020-01-27 NOTE — Progress Notes (Signed)
ANTICOAGULATION CONSULT NOTE - Follow Up Consult  Pharmacy Consult for Lovenox Indication: pulmonary embolus  No Known Allergies  Patient Measurements: Height: 5\' 2"  (157.5 cm) Weight: 148 lb (67.1 kg) IBW/kg (Calculated) : 54.6   Vital Signs: Temp: 97.8 F (36.6 C) (02/02 0718) Temp Source: Oral (02/02 0718) BP: 134/73 (02/02 0400) Pulse Rate: 49 (02/02 0600)  Labs: Recent Labs    01/26/20 2151 01/27/20 0118 01/27/20 0419  HGB 9.3*  --   --   HCT 29.9*  --   --   PLT 151  --   --   CREATININE 1.07  --   --   TROPONINIHS  --  34* 27*    Estimated Creatinine Clearance: 48.7 mL/min (by C-G formula based on SCr of 1.07 mg/dL).   Assessment: 79 year old male with COVID found to have bilateral small PEs  Goal of Therapy:  Anti-Xa level 0.6-1 units/ml 4hrs after LMWH dose given Monitor platelets by anticoagulation protocol: Yes   Plan:  Lovenox 65 mg sq Q 12 hours Watch CBC  Thank you 62, PharmD [phone]  01/27/2020,7:52 AM

## 2020-01-27 NOTE — H&P (Signed)
Bruce Webb CLE:[id number] DOB: 06/12/1941 DOA: 01/26/2020     PCP: Glenda Chroman, MD   Outpatient Specialists: NONE    Patient arrived to ER on 01/26/20 at 2025  Patient coming from: home Lives With family    Chief Complaint:  Chief Complaint  Patient presents with  . Shortness of Breath    HPI: Bruce Webb is a 79 y.o. male with medical history significant of recent Covid infection DM2, HTN    Presented with  Severe hypoxia Initially diagnosed on 1/22 with  COVID , initially on the symptoms was cough but progressed to fever shortness of breath monoclonal Ab on 1/25 bamlanivimab infusion was given at [facility] on 01/23/2020 was started on steroids but continued to have worsening shortness of breath.  Today  EMS was called.  According to EMS today oxygen saturation down to 68% on RA Was transported to [facility], ER In ER 80% on Ra started on 2L Patient only speaks Gujarati  Denies chest pain  For the past few days have had hard time ambulating His daughter and son-in law have been sick as well  Initial exposure was from the relative  Infectious risk factors:  Reports  fever, shortness of breath, dry cough  URI symptoms,        KNOWN COVID POSITIVE   In  ER  COVID TEST    POSITIVE,    No results found for: SARSCOV2NAA   Regarding pertinent Chronic problems:        HTN on lisinopril, lopressor   DM 2 -  diet controlled       While in ER: X-ray showing multifocal pneumonia inflammatory markers including D-dimer severely elevated    The following Work up has been ordered so far:  Orders Placed This Encounter  Procedures  . Blood Culture (routine x 2)  . DG Chest Port 1 View  . Lactic acid, plasma  . CBC WITH DIFFERENTIAL  . Comprehensive metabolic panel  . D-dimer, quantitative  . Procalcitonin  . Lactate dehydrogenase  . Ferritin  . Triglycerides  . Fibrinogen  . C-reactive protein  . Diet NPO time specified  . Cardiac monitoring  . Insert  peripheral IV x 2  . Initiate Carrier Fluid Protocol  . Place surgical mask on patient  . Patient to wear surgical mask during transportation  . Assess patient for ability to self-prone. If able (can move self in bed, ambulate) and stable (SpO2 and oxygen requirement):  . RN/NT - Document specific oxygen requirements in CHL  . Notify EDP if new oxygen requirements escalates > 4L per minute [place]  . RN to draw the following extra tubes:  . Consult to hospitalist  ALL PATIENTS BEING ADMITTED/HAVING PROCEDURES NEED COVID-19 SCREENING  . Airborne and Contact precautions  . Pulse oximetry, continuous  . POC SARS Coronavirus 2 Ag-ED - Nasal Swab (BD Veritor Kit)  . ED EKG 12-Lead  . EKG 12-Lead     Following Medications were ordered in ER: Medications  acetaminophen (TYLENOL) tablet 650 mg (0 mg Oral Hold 01/26/20 2154)  dexamethasone (DECADRON) injection 6 mg (6 mg Intravenous Given 01/26/20 2155)  albuterol (VENTOLIN HFA) 108 (90 Base) MCG/ACT inhaler 2 puff (2 puffs Inhalation Given 01/26/20 2157)        Consult Orders  (From admission, onward)         Start     Ordered   01/26/20 2350  Consult to hospitalist  ALL PATIENTS BEING ADMITTED/HAVING PROCEDURES NEED COVID-19  SCREENING  Once    Comments: ALL PATIENTS BEING ADMITTED/HAVING PROCEDURES NEED COVID-19 SCREENING  Provider:  (Not yet assigned)  Question Answer Comment  Place call to: [facility]   Reason for Consult Admit      01/26/20 2349           Significant initial  Findings: Abnormal Labs Reviewed  CBC WITH DIFFERENTIAL/PLATELET - Abnormal; Notable for the following components:      Result Value   WBC 14.1 (*)    RBC 3.42 (*)    Hemoglobin 9.3 (*)    HCT 29.9 (*)    Neutro Abs 11.9 (*)    Lymphs Abs 0.6 (*)    Monocytes Absolute 1.1 (*)    Abs Immature Granulocytes 0.46 (*)    All other components within normal limits  COMPREHENSIVE METABOLIC PANEL - Abnormal; Notable for the following components:    Glucose, Bld 289 (*)    BUN 30 (*)    Calcium 8.6 (*)    Albumin 3.0 (*)    ALT 57 (*)    All other components within normal limits  D-DIMER, QUANTITATIVE (NOT AT [facility]) - Abnormal; Notable for the following components:   D-Dimer, Quant >20.00 (*)    All other components within normal limits  LACTATE DEHYDROGENASE - Abnormal; Notable for the following components:   LDH 314 (*)    All other components within normal limits  FIBRINOGEN - Abnormal; Notable for the following components:   Fibrinogen 502 (*)    All other components within normal limits  C-REACTIVE PROTEIN - Abnormal; Notable for the following components:   CRP 10.6 (*)    All other components within normal limits  POC SARS CORONAVIRUS 2 AG -  ED - Abnormal; Notable for the following components:   SARS Coronavirus 2 Ag POSITIVE (*)    All other components within normal limits     Otherwise labs showing:    Recent Labs  Lab 01/26/20 2151  NA 138  K 4.5  CO2 24  GLUCOSE 289*  BUN 30*  CREATININE 1.07  CALCIUM 8.6*    Cr   stable,    Lab Results  Component Value Date   CREATININE 1.07 01/26/2020    Recent Labs  Lab 01/26/20 2151  AST 37  ALT 57*  ALKPHOS 79  BILITOT 0.9  PROT 7.0  ALBUMIN 3.0*   Lab Results  Component Value Date   CALCIUM 8.6 (L) 01/26/2020     WBC      Component Value Date/Time   WBC 14.1 (H) 01/26/2020 2151   ANC    Component Value Date/Time   NEUTROABS 11.9 (H) 01/26/2020 2151   ALC No components found for: LYMPHAB    Plt: Lab Results  Component Value Date   PLT 151 01/26/2020    Lactic Acid, Venous    Component Value Date/Time   LATICACIDVEN 2.1 (HH) 01/26/2020 2151    Procalcitonin <0.1   COVID-19 Labs  Recent Labs    01/26/20 2151  DDIMER >20.00*  FERRITIN 142  LDH 314*  CRP 10.6*    No results found for: SARSCOV2NAA   HG/HCT      Component Value Date/Time   HGB 9.3 (L) 01/26/2020 2151   HCT 29.9 (L) 01/26/2020 2151     Troponin    ordered Cardiac Panel (last 3 results) No results for input(s): CKTOTAL, CKMB, TROPONINI, RELINDX in the last 72 hours.     ECG: Ordered Personally reviewed by me showing:  HR : 74 Rhythm:  NSR   no evidence of ischemic changes QTC 409     CXR - multifocal infiltrates    CTA chest - Ordered      ED Triage Vitals  Enc Vitals Group     BP 01/26/20 2042 (!) 156/78     Pulse Rate 01/26/20 2042 76     Resp 01/26/20 2042 (!) 25     Temp 01/26/20 2042 (!) 100.5 F (38.1 C)     Temp Source 01/26/20 2042 Oral     SpO2 01/26/20 2029 97 %     Weight 01/26/20 2049 148 lb (67.1 kg)     Height 01/26/20 2049 '5\' 2"'$  (1.575 m)     Head Circumference --      Peak Flow --      Pain Score 01/26/20 2049 0     Pain Loc --      Pain Edu? --      Excl. in [place]? --   TMAX(24)@       Latest  Blood pressure (!) 142/72, pulse 73, temperature (!) 100.5 F (38.1 C), temperature source Oral, resp. rate (!) 25, height '5\' 2"'$  (1.575 m), weight 67.1 kg, SpO2 99 %.     Hospitalist was called for admission for COVID PNA  Review of Systems:    Pertinent positives include: Fevers, chills, fatigue dyspnea on exertion  non-productive cough,  Constitutional:  No weight loss, night sweats,  weight loss  HEENT:  No headaches, Difficulty swallowing,Tooth/dental problems,Sore throat,  No sneezing, itching, ear ache, nasal congestion, post nasal drip,  Cardio-vascular:  No chest pain, Orthopnea, PND, anasarca, dizziness, palpitations.no Bilateral lower extremity swelling  GI:  No heartburn, indigestion, abdominal pain, nausea, vomiting, diarrhea, change in bowel habits, loss of appetite, melena, blood in stool, hematemesis Resp:  no shortness of breath at rest. No, No excess mucus, no productive cough, No No coughing up of blood.No change in color of mucus.No wheezing. Skin:  no rash or lesions. No jaundice GU:  no dysuria, change in color of urine, no urgency or frequency. No straining to urinate.  No  flank pain.  Musculoskeletal:  No joint pain or no joint swelling. No decreased range of motion. No back pain.  Psych:  No change in mood or affect. No depression or anxiety. No memory loss.  Neuro: no localizing neurological complaints, no tingling, no weakness, no double vision, no gait abnormality, no slurred speech, no confusion  All systems reviewed and apart from [place] all are negative  Past Medical History:   Past Medical History:  Diagnosis Date  . Hypertension       History reviewed. No pertinent surgical history.  Social History:  Ambulatory   independently      reports that he has never smoked. He has never used smokeless tobacco. No history on file for alcohol and drug.   Family History:   Family History  Problem Relation Age of Onset  . Diabetes Neg Hx     Allergies: No Known Allergies   Prior to Admission medications   Medication Sig Start Date End Date Taking? Authorizing Provider  acetaminophen (TYLENOL) 325 MG tablet Take 650 mg by mouth every 6 (six) hours as needed for mild pain or fever.   Yes [provider]  dexamethasone (DECADRON) 4 MG tablet Take 4 mg by mouth daily. x5days 01/23/20  Yes [provider]  HYDROcodone-homatropine (HYCODAN) 5-1.5 MG/5ML syrup Take 5 mLs by mouth 2 (two) times daily  as needed for cough. 01/20/20  Yes [provider]  lisinopril (ZESTRIL) 10 MG tablet Take 10-20 mg by mouth See admin instructions. Take '20mg'$  in am & '10mg'$  in evening 11/25/19  Yes [provider]  metoprolol tartrate (LOPRESSOR) 50 MG tablet Take 50 mg by mouth daily.   Yes [provider]  omeprazole (PRILOSEC) 40 MG capsule Take 40 mg by mouth daily. 11/25/19  Yes [provider]   Physical Exam: Blood pressure (!) 142/72, pulse 73, temperature (!) 100.5 F (38.1 C), temperature source Oral, resp. rate (!) 25, height '5\' 2"'$  (1.575 m), weight 67.1 kg, SpO2 99 %. 1. General:  in   Acute distress increased  work of breathing   -appearing 2. Psychological: Alert and   Oriented 3. Head/ENT:    Dry Mucous Membranes                          Head Non traumatic, neck supple                           Poor Dentition 4. SKIN:   decreased Skin turgor,  Skin clean Dry and intact no rash 5. Heart: Regular rate and rhythm no  Murmur, no Rub or gallop 6. Lungs:  no wheezes or crackles   7. Abdomen: Soft,  non-tender, Non distended bowel sounds present 8. Lower extremities: no clubbing, cyanosis, no  edema 9. Neurologically Grossly intact, moving all 4 extremities equally  10. MSK: Normal range of motion   All other LABS:     Recent Labs  Lab 01/26/20 2151  WBC 14.1*  NEUTROABS 11.9*  HGB 9.3*  HCT 29.9*  MCV 87.4  PLT 151     Recent Labs  Lab 01/26/20 2151  NA 138  K 4.5  CL 105  CO2 24  GLUCOSE 289*  BUN 30*  CREATININE 1.07  CALCIUM 8.6*     Recent Labs  Lab 01/26/20 2151  AST 37  ALT 57*  ALKPHOS 79  BILITOT 0.9  PROT 7.0  ALBUMIN 3.0*       Cultures: No results found for: SDES, SPECREQUEST, CULT, REPTSTATUS   Radiological Exams on Admission: DG Chest Port 1 View  Result Date: 01/26/2020 CLINICAL DATA:  79 year old male with shortness of breath. EXAM: PORTABLE CHEST 1 VIEW COMPARISON:  None. FINDINGS: Bilateral predominantly peripheral and subpleural interstitial coarsening and hazy densities concerning for infiltrate, possibly viral or atypical including COVID-19. Clinical correlation is recommended. No lobar consolidation, pleural effusion, or pneumothorax. The cardiac silhouette is within normal limits. No acute osseous pathology. IMPRESSION: Findings concerning for multifocal pneumonia, possibly viral or atypical in etiology. Clinical correlation and follow-up recommended. Electronically Signed   By: Anner Crete M.D.   On: 01/26/2020 22:17    Chart has been reviewed    Assessment/Plan   79 y.o. male with medical history significant of recent Covid  infection DM2, HTN    Admitted for COVID PNEUMONIA  Present on Admission: . Pneumonia due to COVID-19 virus -  FROM HOME   WITH KNOWN HX OF COVID19 ER Novel Corona Virus testing:  Ordered 01/26/20 and is  positive   Following concerning LAB/ imaging findings:  CBC: leukopenia, lymphopenia   ANC/ALC ratio>3.5     CRP, LDH: increased  IL-6 and Ferritin increased  Procalcitonin: low   CXR: hazy bilateral peripheral opacities     -Following work-up initiated:  sputum cultures   Ordered 01/27/20, Blood cultures Ordered 01/26/20,   CTA Ordered 01/27/20,   Following complications noted:   acute respiratory failure with hypoxia - continue oxygen treatment  Plan of treatment: - Transfer to [facility] facility if   bed is available and meets criteria for transfer     -given severity of illness despite home dose steroids initiate solumedrol And pharmacy consult for remdesivir   - Will follow daily d.dimer - Assess for ability to prone  - Supportive management -Fluid sparing resuscitation  -Provide oxygen as needed currently on   SpO2: 99 % O2 Flow Rate (L/min): 2.5 L/min -initiate lovenox Order CTA if positive will need therapeutic dose  pt with anemia will hemoccult stool   Poor Prognostic factors  79 y.o.  Personal hx of  DM2 HTN,      ABS neutrophil to lymphocyte ratio >3.5     Will order Airborne and Contact precautions  Family/ patient prognosis discussion: I have discussed case with the family/ patient  who are aware of their prognosis At this point they would like   (patient) to be full code     The treatment plan and use of medications and known side effects were discussed with  family. It was clearly explained that there is no proven definitive treatment for COVID-19 infection yet. Any medications used here are based on case reports/anecdotal data which are not peer-reviewed and has not been studied using randomized control trials.  Complete risks and  long-term side effects are unknown, however in the best clinical judgment they seem to be of some clinical benefit rather than medical risks.   family agree with the treatment plan and want to receive these treatments as indicated.     . Acute respiratory failure due to COVID-19 [facility]) -continue with oxygen and re evaluate  . Essential hypertension - restart home meds if no evidence of PE  DM 2-  - Order Sensitive   SSI   -Levemir 5 units BID while on steroids,  -  check TSH and HgA1C    Other plan as per orders.  DVT prophylaxis:    Lovenox     Code Status:  FULL CODE  as per patient   I had personally discussed CODE STATUS with   Family     Family Communication:   Family not at  Bedside   Disposition Plan:   To home once workup is complete and patient is stable        Admission status:  ED Disposition    ED Disposition Condition Comment   Admit  The patient appears reasonably stabilized for admission considering the current resources, flow, and capabilities available in the ED at this time, and I doubt any other [facility] requiring further screening and/or treatment in the ED prior to admission is  present.        inpatient     Expect 2 midnight stay secondary to severity of patient's current illness including   hemodynamic instability despite optimal treatment  Hypoxia, )  Severe lab/radiological/exam abnormalities including:    COVID PNA   That are currently affecting medical management.   I expect  patient to be hospitalized for 2 midnights requiring inpatient medical care.  Patient is at high risk for adverse outcome (such as loss of life or disability) if not treated.  Indication for inpatient stay as follows:    New or worsening hypoxia  Need for IV antibiotics, IV fluids, IV rate controling medications, IV  antihypertensives, IV pain medications, IV anticoagulation    Level of care    tele  For  24H    Precautions: admitted as  covid positive Airborne and Contact  precautions    PPE: Used by the provider:   P100  eye Goggles,  Gloves  gown   Santanna Olenik 01/27/2020, 1:27 AM    [facility]     after 2 AM please page floor coverage PA If 7AM-7PM, please contact the day team taking care of the patient using [email or web address]   Patient was evaluated in the context of the global COVID-19 pandemic, which necessitated consideration that the patient might be at risk for infection with the SARS-CoV-2 virus that causes COVID-19. Institutional protocols and algorithms that pertain to the evaluation of patients at risk for COVID-19 are in a state of rapid change based on information released by regulatory bodies including the CDC and federal and state organizations. These policies and algorithms were followed during the patient's care.

## 2020-01-27 NOTE — Plan of Care (Signed)
Resting in bed, able to ambulate to restroom on room air, tolerated well.   Problem: Education: Goal: Knowledge of risk factors and measures for prevention of condition will improve Outcome: Progressing   Problem: Coping: Goal: Psychosocial and spiritual needs will be supported Outcome: Progressing   Problem: Respiratory: Goal: Will maintain a patent airway Outcome: Progressing Goal: Complications related to the disease process, condition or treatment will be avoided or minimized Outcome: Progressing   Problem: Education: Goal: Knowledge of General Education information will improve Description: Including pain rating scale, medication(s)/side effects and non-pharmacologic comfort measures Outcome: Progressing   Problem: Health Behavior/Discharge Planning: Goal: Ability to manage health-related needs will improve Outcome: Progressing   Problem: Clinical Measurements: Goal: Ability to maintain clinical measurements within normal limits will improve Outcome: Progressing Goal: Will remain free from infection Outcome: Progressing Goal: Diagnostic test results will improve Outcome: Progressing Goal: Respiratory complications will improve Outcome: Progressing Goal: Cardiovascular complication will be avoided Outcome: Progressing   Problem: Activity: Goal: Risk for activity intolerance will decrease Outcome: Progressing   Problem: Nutrition: Goal: Adequate nutrition will be maintained Outcome: Progressing   Problem: Coping: Goal: Level of anxiety will decrease Outcome: Progressing   Problem: Elimination: Goal: Will not experience complications related to bowel motility Outcome: Progressing Goal: Will not experience complications related to urinary retention Outcome: Progressing   Problem: Pain Managment: Goal: General experience of comfort will improve Outcome: Progressing   Problem: Safety: Goal: Ability to remain free from injury will improve Outcome:  Progressing   Problem: Skin Integrity: Goal: Risk for impaired skin integrity will decrease Outcome: Progressing   Problem: Education: Goal: Utilization of techniques to improve thought processes will improve Outcome: Progressing Goal: Knowledge of the prescribed therapeutic regimen will improve Outcome: Progressing   Problem: Activity: Goal: Interest or engagement in leisure activities will improve Outcome: Progressing Goal: Imbalance in normal sleep/wake cycle will improve Outcome: Progressing   Problem: Coping: Goal: Coping ability will improve Outcome: Progressing Goal: Will verbalize feelings Outcome: Progressing   Problem: Health Behavior/Discharge Planning: Goal: Ability to make decisions will improve Outcome: Progressing Goal: Compliance with therapeutic regimen will improve Outcome: Progressing   Problem: Role Relationship: Goal: Will demonstrate positive changes in social behaviors and relationships Outcome: Progressing   Problem: Safety: Goal: Ability to disclose and discuss suicidal ideas will improve Outcome: Progressing Goal: Ability to identify and utilize support systems that promote safety will improve Outcome: Progressing   Problem: Self-Concept: Goal: Will verbalize positive feelings about self Outcome: Progressing Goal: Level of anxiety will decrease Outcome: Progressing   Problem: Education: Goal: Ability to describe self-care measures that may prevent or decrease complications (Diabetes Survival Skills Education) will improve Outcome: Progressing Goal: Individualized Educational Video(s) Outcome: Progressing   Problem: Coping: Goal: Ability to adjust to condition or change in health will improve Outcome: Progressing   Problem: Fluid Volume: Goal: Ability to maintain a balanced intake and output will improve Outcome: Progressing   Problem: Health Behavior/Discharge Planning: Goal: Ability to identify and utilize available resources and  services will improve Outcome: Progressing Goal: Ability to manage health-related needs will improve Outcome: Progressing   Problem: Metabolic: Goal: Ability to maintain appropriate glucose levels will improve Outcome: Progressing   Problem: Nutritional: Goal: Maintenance of adequate nutrition will improve Outcome: Progressing Goal: Progress toward achieving an optimal weight will improve Outcome: Progressing   Problem: Skin Integrity: Goal: Risk for impaired skin integrity will decrease Outcome: Progressing   Problem: Tissue Perfusion: Goal: Adequacy of tissue perfusion will improve Outcome: Progressing

## 2020-01-27 NOTE — ED Notes (Signed)
This RN attempted to call report to [facility] x2 with no success.

## 2020-01-27 NOTE — ED Notes (Signed)
Patient transported to CT 

## 2020-01-27 NOTE — Plan of Care (Signed)
Patient arrived to unit, report given to another RN.  He is alert to self, disoriented to situation, translator service required. Spoke with son on the phone to complete patients admission. Vital signs are stable, patient on 2L O2. No complaints at this time. Will continue to monitor.  Problem: Education: Goal: Knowledge of risk factors and measures for prevention of condition will improve Outcome: Progressing   Problem: Coping: Goal: Psychosocial and spiritual needs will be supported Outcome: Progressing   Problem: Respiratory: Goal: Will maintain a patent airway Outcome: Progressing Goal: Complications related to the disease process, condition or treatment will be avoided or minimized Outcome: Progressing   Problem: Education: Goal: Knowledge of General Education information will improve Description: Including pain rating scale, medication(s)/side effects and non-pharmacologic comfort measures Outcome: Progressing   Problem: Health Behavior/Discharge Planning: Goal: Ability to manage health-related needs will improve Outcome: Progressing   Problem: Clinical Measurements: Goal: Ability to maintain clinical measurements within normal limits will improve Outcome: Progressing Goal: Will remain free from infection Outcome: Progressing Goal: Diagnostic test results will improve Outcome: Progressing Goal: Respiratory complications will improve Outcome: Progressing Goal: Cardiovascular complication will be avoided Outcome: Progressing   Problem: Activity: Goal: Risk for activity intolerance will decrease Outcome: Progressing   Problem: Nutrition: Goal: Adequate nutrition will be maintained Outcome: Progressing   Problem: Coping: Goal: Level of anxiety will decrease Outcome: Progressing   Problem: Elimination: Goal: Will not experience complications related to bowel motility Outcome: Progressing Goal: Will not experience complications related to urinary  retention Outcome: Progressing   Problem: Pain Managment: Goal: General experience of comfort will improve Outcome: Progressing   Problem: Safety: Goal: Ability to remain free from injury will improve Outcome: Progressing   Problem: Skin Integrity: Goal: Risk for impaired skin integrity will decrease Outcome: Progressing   Problem: Education: Goal: Utilization of techniques to improve thought processes will improve Outcome: Progressing Goal: Knowledge of the prescribed therapeutic regimen will improve Outcome: Progressing   Problem: Activity: Goal: Interest or engagement in leisure activities will improve Outcome: Progressing Goal: Imbalance in normal sleep/wake cycle will improve Outcome: Progressing   Problem: Coping: Goal: Coping ability will improve Outcome: Progressing Goal: Will verbalize feelings Outcome: Progressing   Problem: Health Behavior/Discharge Planning: Goal: Ability to make decisions will improve Outcome: Progressing Goal: Compliance with therapeutic regimen will improve Outcome: Progressing   Problem: Role Relationship: Goal: Will demonstrate positive changes in social behaviors and relationships Outcome: Progressing   Problem: Safety: Goal: Ability to disclose and discuss suicidal ideas will improve Outcome: Progressing Goal: Ability to identify and utilize support systems that promote safety will improve Outcome: Progressing   Problem: Self-Concept: Goal: Will verbalize positive feelings about self Outcome: Progressing Goal: Level of anxiety will decrease Outcome: Progressing   Problem: Education: Goal: Ability to describe self-care measures that may prevent or decrease complications (Diabetes Survival Skills Education) will improve Outcome: Progressing Goal: Individualized Educational Video(s) Outcome: Progressing   Problem: Coping: Goal: Ability to adjust to condition or change in health will improve Outcome: Progressing    Problem: Fluid Volume: Goal: Ability to maintain a balanced intake and output will improve Outcome: Progressing   Problem: Health Behavior/Discharge Planning: Goal: Ability to identify and utilize available resources and services will improve Outcome: Progressing Goal: Ability to manage health-related needs will improve Outcome: Progressing   Problem: Metabolic: Goal: Ability to maintain appropriate glucose levels will improve Outcome: Progressing   Problem: Nutritional: Goal: Maintenance of adequate nutrition will improve Outcome: Progressing Goal: Progress toward achieving an optimal  weight will improve Outcome: Progressing   Problem: Skin Integrity: Goal: Risk for impaired skin integrity will decrease Outcome: Progressing   Problem: Tissue Perfusion: Goal: Adequacy of tissue perfusion will improve Outcome: Progressing

## 2020-01-28 DIAGNOSIS — I2699 Other pulmonary embolism without acute cor pulmonale: Secondary | ICD-10-CM

## 2020-01-28 LAB — COMPREHENSIVE METABOLIC PANEL
ALT: 50 U/L — ABNORMAL HIGH (ref 0–44)
AST: 36 U/L (ref 15–41)
Albumin: 2.2 g/dL — ABNORMAL LOW (ref 3.5–5.0)
Alkaline Phosphatase: 69 U/L (ref 38–126)
Anion gap: 7 (ref 5–15)
BUN: 39 mg/dL — ABNORMAL HIGH (ref 8–23)
CO2: 23 mmol/L (ref 22–32)
Calcium: 8.3 mg/dL — ABNORMAL LOW (ref 8.9–10.3)
Chloride: 106 mmol/L (ref 98–111)
Creatinine, Ser: 0.81 mg/dL (ref 0.61–1.24)
GFR calc Af Amer: >60 mL/min (ref >60)
GFR calc non Af Amer: >60 mL/min (ref >60)
Glucose, Bld: 162 mg/dL — ABNORMAL HIGH (ref 70–99)
Potassium: 4.3 mmol/L (ref 3.5–5.1)
Sodium: 136 mmol/L (ref 135–145)
Total Bilirubin: 0.5 mg/dL (ref 0.3–1.2)
Total Protein: 5.5 g/dL — ABNORMAL LOW (ref 6.5–8.1)

## 2020-01-28 LAB — CBC WITH DIFFERENTIAL/PLATELET
Abs Immature Granulocytes: 0.57 10*3/uL — ABNORMAL HIGH (ref 0.00–0.07)
Basophils Absolute: 0 10*3/uL (ref 0.0–0.1)
Basophils Relative: 0 %
Eosinophils Absolute: 0 10*3/uL (ref 0.0–0.5)
Eosinophils Relative: 0 %
HCT: 27.1 % — ABNORMAL LOW (ref 39.0–52.0)
Hemoglobin: 8.8 g/dL — ABNORMAL LOW (ref 13.0–17.0)
Immature Granulocytes: 2 %
Lymphocytes Relative: 4 %
Lymphs Abs: 0.8 10*3/uL (ref 0.7–4.0)
MCH: 27.5 pg (ref 26.0–34.0)
MCHC: 32.5 g/dL (ref 30.0–36.0)
MCV: 84.7 fL (ref 80.0–100.0)
Monocytes Absolute: 1.3 10*3/uL — ABNORMAL HIGH (ref 0.1–1.0)
Monocytes Relative: 6 %
Neutro Abs: 20.7 10*3/uL — ABNORMAL HIGH (ref 1.7–7.7)
Neutrophils Relative %: 88 %
Platelets: 174 10*3/uL (ref 150–400)
RBC: 3.2 MIL/uL — ABNORMAL LOW (ref 4.22–5.81)
RDW: 14.4 % (ref 11.5–15.5)
WBC: 23.4 10*3/uL — ABNORMAL HIGH (ref 4.0–10.5)
nRBC: 0 % (ref 0.0–0.2)

## 2020-01-28 LAB — GLUCOSE, CAPILLARY
Glucose-Capillary: 145 mg/dL — ABNORMAL HIGH (ref 70–99)
Glucose-Capillary: 227 mg/dL — ABNORMAL HIGH (ref 70–99)
Glucose-Capillary: 315 mg/dL — ABNORMAL HIGH (ref 70–99)
Glucose-Capillary: 285 mg/dL — ABNORMAL HIGH (ref 70–99)

## 2020-01-28 LAB — C-REACTIVE PROTEIN: CRP: 9.7 mg/dL — ABNORMAL HIGH (ref <1.0)

## 2020-01-28 LAB — D-DIMER, QUANTITATIVE: D-Dimer, Quant: >20 ug/mL-FEU — ABNORMAL HIGH (ref 0.00–0.50)

## 2020-01-28 LAB — ABO/RH: ABO/RH(D): O POS

## 2020-01-28 MED ORDER — INSULIN ASPART 100 UNIT/ML ~~LOC~~ SOLN
6.0000 [IU] | Freq: Three times a day (TID) | SUBCUTANEOUS | Status: DC
  Administered 2020-01-28 – 2020-01-31 (×9): 6 [IU] via SUBCUTANEOUS

## 2020-01-28 MED ORDER — SENNOSIDES-DOCUSATE SODIUM 8.6-50 MG PO TABS
2.0000 | ORAL_TABLET | Freq: Every evening | ORAL | Status: DC | PRN
  Administered 2020-01-28: 2 via ORAL
  Filled 2020-01-28 (×2): qty 2

## 2020-01-28 MED ORDER — POLYETHYLENE GLYCOL 3350 17 G PO PACK
17.0000 g | PACK | Freq: Every day | ORAL | Status: DC | PRN
  Administered 2020-01-28 – 2020-01-29 (×2): 17 g via ORAL
  Filled 2020-01-28 (×2): qty 1

## 2020-01-28 MED ORDER — IPRATROPIUM-ALBUTEROL 20-100 MCG/ACT IN AERS
1.0000 | INHALATION_SPRAY | Freq: Four times a day (QID) | RESPIRATORY_TRACT | Status: DC | PRN
  Filled 2020-01-28: qty 4

## 2020-01-28 NOTE — Progress Notes (Signed)
PROGRESS NOTE    Bruce Webb  JGG:[id number] DOB: January 10, 1941 DOA: 01/26/2020 PCP: Ignatius Specking, MD   Brief Narrative:  79 y.o.malewith medical history significant of recent Covid infection DM2, HTN, Presented withSevere hypoxia, Initially diagnosedon 1/22 with COVID , [facility]lanivimab infusionwas given at [facility] on 01/[facility]/2020 was started on steroids butcontinued to have worsening shortness of breath.  Noted to be hypoxic by EMS 68% on room air, was 80% on room air in ED, was admitted to [facility] for further management .  Upon admission patient was started on Decadron and remdesivir.  Also received outpatient infusion for bamlanivimab.    Assessment & Plan:   Active Problems:   Pneumonia due to COVID-19 virus   Acute respiratory failure due to COVID-19 [facility])   Essential hypertension   DM (diabetes mellitus), type 2 (HCC)  Acute hypoxic respiratory distress secondary to COVID-19 pneumonia -Oxygen levels-4 L nasal cannula, saturating 95% -Remdesivir-day 2 -Decadrone-day 2 -Routine: Labs have been reviewed including ferritin, LDH, CRP, d-dimer, fibrinogen.  Will need to trend this lab daily. -Vitamin C & Zinc. Prone >16hrs/day.  -procalcitonin-negative -Chest x-ray-multifocal pneumonia -Supportive care-antitussive, inhalers, I-S/flutter -CODE STATUS confirmed  Subacute pulmonary embolism without cor pulmonale -On Lovenox twice daily.  Will eventually transition to p.o.  Essential hypertension -Metoprolol 12.5 mg twice daily, lisinopril  Diabetes mellitus type 2 -Accu-Chek sliding scale -Tradjenta  Vitamin B12 deficiency anemia -Supplements  DVT prophylaxis: Treatment dose Lovenox Code Status: Full code Family Communication: Spoke with his son Raliegh Ip Disposition Plan:   Patient From= home  Patient Anticipated D/C place= Home  Barriers= medical improvement, still requiring 4 L nasal cannula.  Not on any home oxygen  Consultants:   None  Procedures:    None  Antimicrobials:   None   Subjective: Tells me he feels well better than yesterday but still has nonproductive cough.  Some exertional dyspnea.  Review of Systems Otherwise negative except as per HPI, including: General: Denies fever, chills, night sweats or unintended weight loss. Resp: Denies hemoptysis Cardiac: Denies chest pain, palpitations, orthopnea, paroxysmal nocturnal dyspnea. GI: Denies abdominal pain, nausea, vomiting, diarrhea or constipation GU: Denies dysuria, frequency, hesitancy or incontinence MS: Denies muscle aches, joint pain or swelling Neuro: Denies headache, neurologic deficits (focal weakness, numbness, tingling), abnormal gait Psych: Denies anxiety, depression, SI/HI/AVH Skin: Denies new rashes or lesions ID: Denies sick contacts, exotic exposures, travel  Examination:  General exam: Appears calm and comfortable, 4 L nasal cannula Respiratory system: Bilateral rhonchi Cardiovascular system: S1 & S2 heard, RRR. No JVD, murmurs, rubs, gallops or clicks. No pedal edema. Gastrointestinal system: Abdomen is nondistended, soft and nontender. No organomegaly or masses felt. Normal bowel sounds heard. Central nervous system: Alert and oriented. No focal neurological deficits. Extremities: Symmetric 5 x 5 power. Skin: No rashes, lesions or ulcers Psychiatry: Judgement and insight appear normal. Mood & affect appropriate.     Objective: Vitals:   01/28/20 0415 01/28/20 0416 01/28/20 0742 01/28/20 0747  BP:  123/64 120/71   Pulse:  60 (!) 104 65  Resp:  (!) 21 (!) 24 20  Temp: 98.3 F (36.8 C) 98.3 F (36.8 C) 99.6 F (37.6 C)   TempSrc: Oral Oral Oral   SpO2:  90% 92% (!) 87%  Weight:      Height:        Intake/Output Summary (Last 24 hours) at 01/28/2020 1020 Last data filed at 01/28/2020 0756 Gross per 24 hour  Intake --  Output 380 ml  Net -380  ml   Filed Weights   01/26/20 2049  Weight: 67.1 kg     Data Reviewed:    CBC: Recent Labs  Lab 01/26/20 2151 01/27/20 0419 01/28/20 0303  WBC 14.1* 12.3* 23.4*  NEUTROABS 11.9* 11.3* 20.7*  HGB 9.3* 9.5* 8.8*  HCT 29.9* 29.5* 27.1*  MCV 87.4 86.3 84.7  PLT 151 153 174   Basic Metabolic Panel: Recent Labs  Lab 01/26/20 2151 01/27/20 0419 01/28/20 0303  NA 138 135 136  K 4.5 4.4 4.3  CL 105 104 106  CO2 24 22 23   GLUCOSE 289* 283* 162*  BUN 30* 29* 39*  CREATININE 1.07 0.95 0.81  CALCIUM 8.6* 8.0* 8.3*   GFR: Estimated Creatinine Clearance: 64.4 mL/min (by C-G formula based on SCr of 0.81 mg/dL). Liver Function Tests: Recent Labs  Lab 01/26/20 2151 01/27/20 0419 01/28/20 0303  AST 37 29 36  ALT 57* 45* 50*  ALKPHOS 79 67 69  BILITOT 0.9 0.5 0.5  PROT 7.0 6.3* 5.5*  ALBUMIN 3.0* 2.6* 2.2*   No results for input(s): LIPASE, AMYLASE in the last 168 hours. No results for input(s): AMMONIA in the last 168 hours. Coagulation Profile: No results for input(s): INR, PROTIME in the last 168 hours. Cardiac Enzymes: No results for input(s): CKTOTAL, CKMB, CKMBINDEX, TROPONINI in the last 168 hours. BNP (last 3 results) No results for input(s): PROBNP in the last 8760 hours. HbA1C: No results for input(s): HGBA1C in the last 72 hours. CBG: Recent Labs  Lab 01/27/20 0716 01/27/20 1131 01/27/20 1613 01/27/20 1912 01/28/20 0741  GLUCAP 258* 286* 318* 353* 145*   Lipid Profile: Recent Labs    01/26/20 2151  TRIG 142   Thyroid Function Tests: No results for input(s): TSH, T4TOTAL, FREET4, T3FREE, THYROIDAB in the last 72 hours. Anemia Panel: Recent Labs    01/26/20 2151 01/27/20 0419  VITAMINB12  --  165*  FOLATE  --  13.5  FERRITIN 142 134  TIBC  --  242*  IRON  --  17*  RETICCTPCT  --  1.3   Sepsis Labs: Recent Labs  Lab 01/26/20 2151 01/27/20 0118  PROCALCITON <0.10  --   LATICACIDVEN 2.1* 1.7    Recent Results (from the past 240 hour(s))  Blood Culture (routine x 2)     Status: None (Preliminary result)    Collection Time: 01/26/20  9:51 PM   Specimen: BLOOD  Result Value Ref Range Status   Specimen Description BLOOD RIGHT ANTECUBITAL  Final   Special Requests   Final    BOTTLES DRAWN AEROBIC AND ANAEROBIC Blood Culture adequate volume Performed at [facility], [address] [place], [place]    Culture NO GROWTH < 12 HOURS  Final   Report Status PENDING  Incomplete  Blood Culture (routine x 2)     Status: None (Preliminary result)   Collection Time: 01/27/20  1:18 AM   Specimen: BLOOD  Result Value Ref Range Status   Specimen Description BLOOD BLOOD LEFT FOREARM  Final   Special Requests   Final    BOTTLES DRAWN AEROBIC AND ANAEROBIC Blood Culture results may not be optimal due to an excessive volume of blood received in culture bottles Performed at [facility], [address] [place], [place]    Culture NO GROWTH <12 HOURS  Final   Report Status PENDING  Incomplete         Radiology Studies: CT ANGIO CHEST PE W OR WO  CONTRAST  Result Date: 01/27/2020 CLINICAL DATA:  COVID-19 positivity with increased cough EXAM: CT ANGIOGRAPHY CHEST WITH CONTRAST TECHNIQUE: Multidetector CT imaging of the chest was performed using the standard protocol during bolus administration of intravenous contrast. Multiplanar CT image reconstructions and MIPs were obtained to evaluate the vascular anatomy. CONTRAST:  168mL OMNIPAQUE IOHEXOL 350 MG/ML SOLN COMPARISON:  None. FINDINGS: Cardiovascular: Thoracic aorta is within normal limits within normal branching pattern. No cardiac enlargement is seen. Scattered small filling defects are noted within the pulmonary artery on the right. No right heart strain is noted. 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 Mediastinum/Nodes: Thoracic inlet is within normal limits. No hilar or mediastinal adenopathy is noted. The esophagus as visualized is within limits. Lungs/Pleura: Lungs  are well aerated bilaterally with predominantly peripheral ground-glass opacities consistent with given clinical history of COVID-19 positivity trees no effusion is seen. No parenchymal nodules noted. Upper Abdomen: Visualized upper abdomen is within normal limits. Musculoskeletal: Degenerative changes of the thoracic spine are seen. No acute bony abnormality is noted. Review of the MIP images confirms the above findings. 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 No right heart strain is noted. Bilateral ground-glass opacities consistent with the given clinical history of COVID-19 positivity. Electronically Signed   By: Inez Catalina M.D.   On: 01/27/2020 02:14   DG Chest Port 1 View  Result Date: 01/26/2020 CLINICAL DATA:  79 year old male with shortness of breath. EXAM: PORTABLE CHEST 1 VIEW COMPARISON:  None. FINDINGS: Bilateral predominantly peripheral and subpleural interstitial coarsening and hazy densities concerning for infiltrate, possibly viral or atypical including COVID-19. Clinical correlation is recommended. No lobar consolidation, pleural effusion, or pneumothorax. The cardiac silhouette is within normal limits. No acute osseous pathology. IMPRESSION: Findings concerning for multifocal pneumonia, possibly viral or atypical in etiology. Clinical correlation and follow-up recommended. Electronically Signed   By: Anner Crete M.D.   On: 01/26/2020 22:17        Scheduled Meds: . acetaminophen  650 mg Oral Once  . vitamin C  500 mg Oral Daily  . cyanocobalamin  1,000 mcg Intramuscular Daily   Followed by  . [START ON 01/30/2020] cyanocobalamin  1,000 mcg Intramuscular Weekly  . dexamethasone (DECADRON) injection  6 mg Intravenous Daily  . enoxaparin (LOVENOX) injection  65 mg Subcutaneous Q12H  . insulin aspart  0-15 Units Subcutaneous Q4H  . insulin detemir  0.075 Units/kg Subcutaneous BID  . linagliptin  5 mg Oral Daily  . lisinopril   10 mg Oral Daily  . metoprolol tartrate  12.5 mg Oral BID  . pantoprazole  40 mg Oral Daily  . sodium chloride flush  3 mL Intravenous Q12H  . sodium chloride flush  3 mL Intravenous Q12H  . zinc sulfate  220 mg Oral Daily   Continuous Infusions: . sodium chloride    . remdesivir 100 mg in NS 100 mL 100 mg (01/28/20 0849)     LOS: 1 day   Time spent= 35 mins    Rosella Crandell Arsenio Loader, MD [facility]  If 7PM-7AM, please contact night-coverage  01/28/2020, 10:20 AM

## 2020-01-28 NOTE — Plan of Care (Signed)
  Problem: Education: Goal: Knowledge of risk factors and measures for prevention of condition will improve Outcome: Progressing   

## 2020-01-28 NOTE — Progress Notes (Signed)
Report given to Linda, RN.

## 2020-01-28 NOTE — Progress Notes (Signed)
Inpatient Diabetes Program Recommendations  AACE/ADA: New Consensus Statement on Inpatient Glycemic Control   Target Ranges:  Prepandial:   less than 140 mg/dL      Peak postprandial:   less than 180 mg/dL (1-2 hours)      Critically ill patients:  140 - 180 mg/dL   Results for Bruce Webb, Bruce Webb (MRN [id number]) as of 01/28/2020 12:30  Ref. Range 01/27/2020 07:16 01/27/2020 11:31 01/27/2020 16:13 01/27/2020 19:12 01/28/2020 07:41 01/28/2020 11:41  Glucose-Capillary Latest Ref Range: 70 - 99 mg/dL 701 (H) 410 (H) 301 (H) 353 (H) 145 (H) 227 (H)   Review of Glycemic Control  Diabetes history: DM2 Outpatient Diabetes medications: None; diet controlled Current orders for Inpatient glycemic control: Levemir 5 units BID, Novolog 0-15 units Q4H, Tradjenta 5 mg daily; Decadron 6 mg Q24H  Inpatient Diabetes Program Recommendations:   Insulin - Meal Coverage: IF steroid are continued as ordered, please consider ordering Novolog 6 units TID with meals for meal coverage if patient eats at least 50% of meals.  Thanks, Orlando Penner, RN, MSN, CDE Diabetes Coordinator Inpatient Diabetes Program [phone] (Team Pager from 8am to 5pm)

## 2020-01-28 NOTE — Progress Notes (Signed)
Resumed care of this patient at 1525. Assessment completed with the language interpreter.

## 2020-01-29 LAB — GLUCOSE, CAPILLARY
Glucose-Capillary: 103 mg/dL — ABNORMAL HIGH (ref 70–99)
Glucose-Capillary: 114 mg/dL — ABNORMAL HIGH (ref 70–99)
Glucose-Capillary: 114 mg/dL — ABNORMAL HIGH (ref 70–99)
Glucose-Capillary: 214 mg/dL — ABNORMAL HIGH (ref 70–99)
Glucose-Capillary: 272 mg/dL — ABNORMAL HIGH (ref 70–99)
Glucose-Capillary: 298 mg/dL — ABNORMAL HIGH (ref 70–99)

## 2020-01-29 LAB — COMPREHENSIVE METABOLIC PANEL
ALT: 65 U/L — ABNORMAL HIGH (ref 0–44)
AST: 47 U/L — ABNORMAL HIGH (ref 15–41)
Albumin: 2.6 g/dL — ABNORMAL LOW (ref 3.5–5.0)
Alkaline Phosphatase: 73 U/L (ref 38–126)
Anion gap: 9 (ref 5–15)
BUN: 44 mg/dL — ABNORMAL HIGH (ref 8–23)
CO2: 25 mmol/L (ref 22–32)
Calcium: 8.4 mg/dL — ABNORMAL LOW (ref 8.9–10.3)
Chloride: 101 mmol/L (ref 98–111)
Creatinine, Ser: 0.9 mg/dL (ref 0.61–1.24)
GFR calc Af Amer: >60 mL/min (ref >60)
GFR calc non Af Amer: >60 mL/min (ref >60)
Glucose, Bld: 98 mg/dL (ref 70–99)
Potassium: 3.9 mmol/L (ref 3.5–5.1)
Sodium: 135 mmol/L (ref 135–145)
Total Bilirubin: 0.8 mg/dL (ref 0.3–1.2)
Total Protein: 6 g/dL — ABNORMAL LOW (ref 6.5–8.1)

## 2020-01-29 LAB — CBC WITH DIFFERENTIAL/PLATELET
Abs Immature Granulocytes: 0.63 10*3/uL — ABNORMAL HIGH (ref 0.00–0.07)
Basophils Absolute: 0.1 10*3/uL (ref 0.0–0.1)
Basophils Relative: 0 %
Eosinophils Absolute: 0 10*3/uL (ref 0.0–0.5)
Eosinophils Relative: 0 %
HCT: 29.7 % — ABNORMAL LOW (ref 39.0–52.0)
Hemoglobin: 9.5 g/dL — ABNORMAL LOW (ref 13.0–17.0)
Immature Granulocytes: 3 %
Lymphocytes Relative: 3 %
Lymphs Abs: 0.8 10*3/uL (ref 0.7–4.0)
MCH: 27.2 pg (ref 26.0–34.0)
MCHC: 32 g/dL (ref 30.0–36.0)
MCV: 85.1 fL (ref 80.0–100.0)
Monocytes Absolute: 1.3 10*3/uL — ABNORMAL HIGH (ref 0.1–1.0)
Monocytes Relative: 5 %
Neutro Abs: 22.9 10*3/uL — ABNORMAL HIGH (ref 1.7–7.7)
Neutrophils Relative %: 89 %
Platelets: 229 10*3/uL (ref 150–400)
RBC: 3.49 MIL/uL — ABNORMAL LOW (ref 4.22–5.81)
RDW: 14.6 % (ref 11.5–15.5)
WBC: 25.7 10*3/uL — ABNORMAL HIGH (ref 4.0–10.5)
nRBC: 0 % (ref 0.0–0.2)

## 2020-01-29 LAB — MAGNESIUM: Magnesium: 2 mg/dL (ref 1.7–2.4)

## 2020-01-29 LAB — D-DIMER, QUANTITATIVE: D-Dimer, Quant: 14.53 ug/mL-FEU — ABNORMAL HIGH (ref 0.00–0.50)

## 2020-01-29 LAB — C-REACTIVE PROTEIN: CRP: 7 mg/dL — ABNORMAL HIGH (ref <1.0)

## 2020-01-29 MED ORDER — FERROUS SULFATE 325 (65 FE) MG PO TABS
325.0000 mg | ORAL_TABLET | Freq: Two times a day (BID) | ORAL | Status: DC
  Administered 2020-01-29 – 2020-01-31 (×5): 325 mg via ORAL
  Filled 2020-01-29 (×5): qty 1

## 2020-01-29 MED ORDER — APIXABAN 5 MG PO TABS
10.0000 mg | ORAL_TABLET | Freq: Two times a day (BID) | ORAL | Status: DC
  Administered 2020-01-29 – 2020-01-31 (×4): 10 mg via ORAL
  Filled 2020-01-29 (×4): qty 2

## 2020-01-29 NOTE — Progress Notes (Addendum)
ANTICOAGULATION CONSULT NOTE - Follow Up Consult  Pharmacy Consult for Lovenox to Eliquis Indication: pulmonary embolus  No Known Allergies  Patient Measurements: Height: 5\' 2"  (157.5 cm) Weight: 148 lb (67.1 kg) IBW/kg (Calculated) : 54.6   Vital Signs: Temp: 98.7 F (37.1 C) (02/04 0753) Temp Source: Oral (02/04 0753) BP: 145/68 (02/04 0923) Pulse Rate: 67 (02/04 0923)  Labs: Recent Labs    01/26/20 2151 01/27/20 0118 01/27/20 0419 01/27/20 0419 01/28/20 0303 01/29/20 0350  HGB   < >  --  9.5*   < > 8.8* 9.5*  HCT   < >  --  29.5*  --  27.1* 29.7*  PLT   < >  --  153  --  174 229  CREATININE   < >  --  0.95  --  0.81 0.90  TROPONINIHS  --  34* 27*  --   --   --    < > = values in this interval not displayed.    Estimated Creatinine Clearance: 57.9 mL/min (by C-G formula based on SCr of 0.9 mg/dL).   Assessment: 79 year old male with COVID found to have bilateral small PEs Last dose of Lovenox this AM  Goal of Therapy:  Anti-Xa level 0.6-1 units/ml 4hrs after LMWH dose given Monitor platelets by anticoagulation protocol: Yes   Plan:  Eliquis 10 mg po BID x 7 days starting tonight then 5 mg po BID Watch CBC  Thank you 62, PharmD [phone]  01/29/2020,10:57 AM

## 2020-01-29 NOTE — Progress Notes (Addendum)
Patient A&Ox4, currently on 5L HFNC SpO2 >93%. Patient dangle bedside when RN entered room. No complaints this morning. Language interpreter used for assessment, medication administration and plan of care update.   Patient up to bathroom with standby assist to wash and brushteeth. Steady on feet. 50% of breakfast eaten. No problems swallowing medication. Patient sitting in chair.   Will wean oxygen as appropriate. Call light in reach. Chair alarm on. Will continue to monitor.  1103: Spoke with patients son with an update. Will call back around 1130 so patient and wife can speak.

## 2020-01-29 NOTE — Progress Notes (Addendum)
PROGRESS NOTE    Bruce Webb  FGH:[id number] DOB: 03-27-1941 DOA: 01/26/2020 PCP: Ignatius Specking, MD   Brief Narrative:  79 y.o.malewith medical history significant of recent Covid infection DM2, HTN, Presented withSevere hypoxia, Initially diagnosedon 1/22 with COVID , bamlanivimab infusionwas given at [facility] on 01/23/2020 was started on steroids butcontinued to have worsening shortness of breath.  Noted to be hypoxic by EMS 68% on room air, was 80% on room air in ED, was admitted to [facility] for further management .  Upon admission patient was started on Decadron and remdesivir.  Also received outpatient infusion for bamlanivimab.    Assessment & Plan:   Principal Problem:   Acute respiratory failure due to COVID-19 [facility]) Active Problems:   Pneumonia due to COVID-19 virus   Essential hypertension   DM (diabetes mellitus), type 2 (HCC)   Acute pulmonary embolism without acute cor pulmonale (HCC)  Acute hypoxic respiratory distress secondary to COVID-19 pneumonia -Oxygen levels-3-4 L nasal cannula, Not on any home Oxygen. Out of bed to chair.  -Remdesivir-day 3 -Decadrone-day 3 -Routine: Labs have been reviewed including ferritin, LDH, CRP, d-dimer, fibrinogen.  Will need to trend this lab daily. -Vitamin C & Zinc. Prone >16hrs/day.  -procalcitonin-negative -Chest x-ray-multifocal pneumonia -Supportive care-antitussive, inhalers, I-S/flutter -CODE STATUS confirmed  Subacute pulmonary embolism without cor pulmonale -Transition Lovenox to Eliquis  Essential hypertension -Metoprolol 12.5 mg twice daily, lisinopril  Diabetes mellitus type 2 -Accu-Chek sliding scale -Tradjenta  Vitamin B12 deficiency anemia Iron Def -Supplements, Bowel Regimen   Constipation - Bowel regimen.   PT/OT  DVT prophylaxis: Treatment dose Lovenox, transition to Eliquis Code Status: Full code Family Communication: Spoke with his son Raliegh Ip again today.  Disposition Plan:   Patient From=  home  Patient Anticipated D/C place= Home  Barriers= Still needing O2 with dyspnea with minimal exertion. Not at baseline. Unsafe for D/C  Consultants:   None  Procedures:   None  Antimicrobials:   None   Subjective: Out of bed to chair this morning. Still has coughing and exertional dyspnea. Also reports of constipation.   Review of Systems Otherwise negative except as per HPI, including: General = no fevers, chills, dizziness,  fatigue HEENT/EYES = negative for loss of vision, double vision, blurred vision,  sore throa Cardiovascular= negative for chest pain, palpitation Respiratory/lungs= negative for shortness of breath, cough, wheezing; hemoptysis,  Gastrointestinal= negative for nausea, vomiting, abdominal pain Genitourinary= negative for Dysuria MSK = Negative for arthralgia, myalgias Neurology= Negative for headache, numbness, tingling  Psychiatry= Negative for suicidal and homocidal ideation Skin= Negative for Rash   Examination:  Constitutional: Not in acute distress; 3-4 L Respiratory: Clear to auscultation bilaterally Cardiovascular: Normal sinus rhythm, no rubs Abdomen: Nontender nondistended good bowel sounds Musculoskeletal: No edema noted Skin: No rashes seen Neurologic: CN 2-12 grossly intact.  And nonfocal Psychiatric: Normal judgment and insight. Alert and oriented x 3. Normal mood.     Objective: Vitals:   01/29/20 0001 01/29/20 0326 01/29/20 0753 01/29/20 0923  BP: 129/73 125/69 (!) 141/67 (!) 145/68  Pulse: 63 (!) 58 62 67  Resp: 19 20 17    Temp: 98.8 F (37.1 C) 98.8 F (37.1 C) 98.7 F (37.1 C)   TempSrc: Oral Oral Oral   SpO2: 94% 90% (!) 88%   Weight:      Height:        Intake/Output Summary (Last 24 hours) at 01/29/2020 1032 Last data filed at 01/29/2020 0900 Gross per 24 hour  Intake 230 ml  Output 540 ml  Net -310 ml   Filed Weights   01/26/20 2049  Weight: 67.1 kg     Data Reviewed:   CBC: Recent Labs  Lab  01/26/20 2151 01/27/20 0419 01/28/20 0303 01/29/20 0350  WBC 14.1* 12.3* 23.4* 25.7*  NEUTROABS 11.9* 11.3* 20.7* 22.9*  HGB 9.3* 9.5* 8.8* 9.5*  HCT 29.9* 29.5* 27.1* 29.7*  MCV 87.4 86.3 84.7 85.1  PLT 151 153 174 229   Basic Metabolic Panel: Recent Labs  Lab 01/26/20 2151 01/27/20 0419 01/28/20 0303 01/29/20 0350  NA 138 135 136 135  K 4.5 4.4 4.3 3.9  CL 105 104 106 101  CO2 24 22 23 25   GLUCOSE 289* 283* 162* 98  BUN 30* 29* 39* 44*  CREATININE 1.07 0.95 0.81 0.90  CALCIUM 8.6* 8.0* 8.3* 8.4*  MG  --   --   --  2.0   GFR: Estimated Creatinine Clearance: 57.9 mL/min (by C-G formula based on SCr of 0.9 mg/dL). Liver Function Tests: Recent Labs  Lab 01/26/20 2151 01/27/20 0419 01/28/20 0303 01/29/20 0350  AST 37 29 36 47*  ALT 57* 45* 50* 65*  ALKPHOS 79 67 69 73  BILITOT 0.9 0.5 0.5 0.8  PROT 7.0 6.3* 5.5* 6.0*  ALBUMIN 3.0* 2.6* 2.2* 2.6*   No results for input(s): LIPASE, AMYLASE in the last 168 hours. No results for input(s): AMMONIA in the last 168 hours. Coagulation Profile: No results for input(s): INR, PROTIME in the last 168 hours. Cardiac Enzymes: No results for input(s): CKTOTAL, CKMB, CKMBINDEX, TROPONINI in the last 168 hours. BNP (last 3 results) No results for input(s): PROBNP in the last 8760 hours. HbA1C: No results for input(s): HGBA1C in the last 72 hours. CBG: Recent Labs  Lab 01/28/20 1615 01/28/20 2003 01/28/20 2359 01/29/20 0323 01/29/20 0811  GLUCAP 315* 285* 114* 114* 103*   Lipid Profile: Recent Labs    01/26/20 2151  TRIG 142   Thyroid Function Tests: No results for input(s): TSH, T4TOTAL, FREET4, T3FREE, THYROIDAB in the last 72 hours. Anemia Panel: Recent Labs    01/26/20 2151 01/27/20 0419  VITAMINB12  --  165*  FOLATE  --  13.5  FERRITIN 142 134  TIBC  --  242*  IRON  --  17*  RETICCTPCT  --  1.3   Sepsis Labs: Recent Labs  Lab 01/26/20 2151 01/27/20 0118  PROCALCITON <0.10  --   LATICACIDVEN  2.1* 1.7    Recent Results (from the past 240 hour(s))  Blood Culture (routine x 2)     Status: None (Preliminary result)   Collection Time: 01/26/20  9:51 PM   Specimen: BLOOD  Result Value Ref Range Status   Specimen Description   Final    BLOOD RIGHT ANTECUBITAL Performed at [facility], [address], [place], [place]    Special Requests   Final    BOTTLES DRAWN AEROBIC AND ANAEROBIC Blood Culture adequate volume Performed at [facility], [address] [place], [place]    Culture   Final    NO GROWTH 1 DAY Performed at [facility], [address] [place], [place]    Report Status PENDING  Incomplete  Blood Culture (routine x 2)     Status: None (Preliminary result)   Collection Time: 01/27/20  1:18 AM   Specimen: BLOOD  Result Value Ref Range Status   Specimen Description   Final    BLOOD  BLOOD LEFT FOREARM Performed at [facility] [address] [place], [place] [address]    Special Requests   Final    BOTTLES DRAWN AEROBIC AND ANAEROBIC Blood Culture results may not be optimal due to an excessive volume of blood received in culture bottles Performed at [facility] [address] [place], [place] [address]    Culture   Final    NO GROWTH 1 DAY Performed at [facility], [place] [address], [place], [place] [address]    Report Status PENDING  Incomplete         Radiology Studies: No results found.      Scheduled Meds: . acetaminophen  650 mg Oral Once  . vitamin C  500 mg Oral Daily  . [START ON 01/30/2020] cyanocobalamin  1,000 mcg Intramuscular Weekly  . dexamethasone (DECADRON) injection  6 mg Intravenous Daily  . enoxaparin (LOVENOX) injection  65 mg Subcutaneous Q12H  . ferrous sulfate  325 mg Oral BID WC  . insulin aspart  0-15 Units Subcutaneous Q4H  . insulin aspart  6 Units Subcutaneous TID WC  . insulin detemir  0.075  Units/kg Subcutaneous BID  . linagliptin  5 mg Oral Daily  . lisinopril  10 mg Oral Daily  . metoprolol tartrate  12.5 mg Oral BID  . pantoprazole  40 mg Oral Daily  . sodium chloride flush  3 mL Intravenous Q12H  . sodium chloride flush  3 mL Intravenous Q12H  . zinc sulfate  220 mg Oral Daily   Continuous Infusions: . sodium chloride    . remdesivir 100 mg in NS 100 mL 100 mg (01/29/20 0939)     LOS: 2 days   Time spent= 35 mins    Cross Jorge Arsenio Loader, MD [facility]  If 7PM-7AM, please contact night-coverage  01/29/2020, 10:32 AM

## 2020-01-30 LAB — CBC WITH DIFFERENTIAL/PLATELET
Abs Immature Granulocytes: 0.33 10*3/uL — ABNORMAL HIGH (ref 0.00–0.07)
Basophils Absolute: 0 10*3/uL (ref 0.0–0.1)
Basophils Relative: 0 %
Eosinophils Absolute: 0 10*3/uL (ref 0.0–0.5)
Eosinophils Relative: 0 %
HCT: 27.8 % — ABNORMAL LOW (ref 39.0–52.0)
Hemoglobin: 9 g/dL — ABNORMAL LOW (ref 13.0–17.0)
Immature Granulocytes: 2 %
Lymphocytes Relative: 3 %
Lymphs Abs: 0.6 10*3/uL — ABNORMAL LOW (ref 0.7–4.0)
MCH: 27.4 pg (ref 26.0–34.0)
MCHC: 32.4 g/dL (ref 30.0–36.0)
MCV: 84.5 fL (ref 80.0–100.0)
Monocytes Absolute: 1.2 10*3/uL — ABNORMAL HIGH (ref 0.1–1.0)
Monocytes Relative: 6 %
Neutro Abs: 19 10*3/uL — ABNORMAL HIGH (ref 1.7–7.7)
Neutrophils Relative %: 89 %
Platelets: 145 10*3/uL — ABNORMAL LOW (ref 150–400)
RBC: 3.29 MIL/uL — ABNORMAL LOW (ref 4.22–5.81)
RDW: 14.7 % (ref 11.5–15.5)
WBC: 21.1 10*3/uL — ABNORMAL HIGH (ref 4.0–10.5)
nRBC: 0 % (ref 0.0–0.2)

## 2020-01-30 LAB — COMPREHENSIVE METABOLIC PANEL
ALT: 76 U/L — ABNORMAL HIGH (ref 0–44)
AST: 48 U/L — ABNORMAL HIGH (ref 15–41)
Albumin: 2.4 g/dL — ABNORMAL LOW (ref 3.5–5.0)
Alkaline Phosphatase: 83 U/L (ref 38–126)
Anion gap: 6 (ref 5–15)
BUN: 42 mg/dL — ABNORMAL HIGH (ref 8–23)
CO2: 25 mmol/L (ref 22–32)
Calcium: 8.2 mg/dL — ABNORMAL LOW (ref 8.9–10.3)
Chloride: 105 mmol/L (ref 98–111)
Creatinine, Ser: 0.95 mg/dL (ref 0.61–1.24)
GFR calc Af Amer: >60 mL/min (ref >60)
GFR calc non Af Amer: >60 mL/min (ref >60)
Glucose, Bld: 98 mg/dL (ref 70–99)
Potassium: 4.6 mmol/L (ref 3.5–5.1)
Sodium: 136 mmol/L (ref 135–145)
Total Bilirubin: 0.5 mg/dL (ref 0.3–1.2)
Total Protein: 5.6 g/dL — ABNORMAL LOW (ref 6.5–8.1)

## 2020-01-30 LAB — C-REACTIVE PROTEIN: CRP: 7.1 mg/dL — ABNORMAL HIGH (ref <1.0)

## 2020-01-30 LAB — GLUCOSE, CAPILLARY
Glucose-Capillary: 124 mg/dL — ABNORMAL HIGH (ref 70–99)
Glucose-Capillary: 202 mg/dL — ABNORMAL HIGH (ref 70–99)
Glucose-Capillary: 218 mg/dL — ABNORMAL HIGH (ref 70–99)
Glucose-Capillary: 267 mg/dL — ABNORMAL HIGH (ref 70–99)
Glucose-Capillary: 83 mg/dL (ref 70–99)
Glucose-Capillary: 94 mg/dL (ref 70–99)

## 2020-01-30 LAB — MAGNESIUM: Magnesium: 2 mg/dL (ref 1.7–2.4)

## 2020-01-30 LAB — D-DIMER, QUANTITATIVE: D-Dimer, Quant: >20 ug/mL-FEU — ABNORMAL HIGH (ref 0.00–0.50)

## 2020-01-30 NOTE — Progress Notes (Signed)
Evening care performed and medications administered with assistance of translator. Patient in no distress.  Oxygen saturations remaining in low 90s on 3L/Keith.  Patient denies needs at this time.

## 2020-01-30 NOTE — Progress Notes (Signed)
Occupational Therapy Evaluation Patient Details Name: Bruce Webb MRN: [id number] DOB: [birth year] Today's Date: 01/30/2020    History of Present Illness 79 y.o. male with medical history significant of recent Covid infection DM2, HTN, Presented with  Severe hypoxia, Initially diagnosed on 1/22 with  COVID   Clinical Impression   Patient is very kind, he speaks [place] so interpreter service was utilized during evaluation.  Patient lives at home with wife and children and is independent with ADLs at prior leve.  He typically walks with a cane.  Patient remained on 3L during session. Patient able to transfer and ambulate with min guard.  SpO2 93 at rest and desat to 84 after 50 ft mobility and he stated he felt shortness of breath by the end (1/4 DS).  Completed flutter valve and incentive spirometer education, able to pull 750.  Will continue with OT acutely to address the deficits listed below.  Anticipate discharge to home.     Follow Up Recommendations  No OT follow up    Equipment Recommendations  3 in 1 bedside commode    Recommendations for Other Services       Precautions / Restrictions Precautions Precautions: None Restrictions Weight Bearing Restrictions: No      Mobility Bed Mobility Overal bed mobility: Needs Assistance Bed Mobility: Supine to Sit     Supine to sit: Supervision        Transfers Overall transfer level: Needs assistance Equipment used: None                  Balance Overall balance assessment: Mild deficits observed, not formally tested                                         ADL either performed or assessed with clinical judgement   ADL Overall ADL's : Needs assistance/impaired Eating/Feeding: Independent;Sitting   Grooming: Min guard;Standing   Upper Body Bathing: Supervision/ safety;Sitting   Lower Body Bathing: Min guard;Sit to/from stand   Upper Body Dressing : Set up;Sitting   Lower Body Dressing: Sit  to/from stand;Min guard   Toilet Transfer: Min guard;Ambulation   Toileting- Clothing Manipulation and Hygiene: Supervision/safety;Sitting/lateral lean       Functional mobility during ADLs: Min guard       Vision         Perception     Praxis      Pertinent Vitals/Pain Pain Assessment: No/denies pain     Hand Dominance     Extremity/Trunk Assessment Upper Extremity Assessment Upper Extremity Assessment: Generalized weakness           Communication Communication Communication: Prefers language other than English   Cognition Arousal/Alertness: Awake/alert Behavior During Therapy: WFL for tasks assessed/performed Overall Cognitive Status: Within Functional Limits for tasks assessed                                     General Comments       Exercises Exercises: Other exercises Other Exercises Other Exercises: x10 flutter valve Other Exercises: x10 incentive spirometer   Shoulder Instructions      Home Living Family/patient expects to be discharged to:: Private residence Living Arrangements: Spouse/significant other;Children Available Help at Discharge: Family Type of Home: House  Home Equipment: Gilmer Mor - single point   Additional Comments: 5-10 stairs, unclear if there are in the home or stairs to the enterance       Prior Functioning/Environment Level of Independence: Independent with assistive device(s)        Comments: Walks with cane most of time        OT Problem List: Decreased strength;Decreased activity tolerance;Impaired balance (sitting and/or standing);Cardiopulmonary status limiting activity      OT Treatment/Interventions: Self-care/ADL training;Therapeutic exercise;Energy conservation;Therapeutic activities;Balance training;Patient/family education    OT Goals(Current goals can be found in the care plan section) Acute Rehab OT Goals Patient Stated Goal: Go home to family OT Goal  Formulation: With patient Time For Goal Achievement: 02/13/20 Potential to Achieve Goals: Good  OT Frequency: Min 3X/week   Barriers to D/C:            Co-evaluation              AM-PAC OT "6 Clicks" Daily Activity     Outcome Measure Help from another person eating meals?: None Help from another person taking care of personal grooming?: A Little Help from another person toileting, which includes using toliet, bedpan, or urinal?: A Little Help from another person bathing (including washing, rinsing, drying)?: A Little Help from another person to put on and taking off regular upper body clothing?: A Little Help from another person to put on and taking off regular lower body clothing?: A Little 6 Click Score: 19   End of Session Equipment Utilized During Treatment: Gait belt;Oxygen Nurse Communication: Mobility status  Activity Tolerance: Patient tolerated treatment well Patient left: in chair;with call bell/phone within reach;with chair alarm set  OT Visit Diagnosis: Unsteadiness on feet (R26.81);Muscle weakness (generalized) (M62.81);Other (comment)(Decreased activity tolerance)                Time: 1005-1058 OT Time Calculation (min): 53 min Charges:  OT General Charges $OT Visit: 1 Visit OT Evaluation $OT Eval Moderate Complexity: 1 Mod OT Treatments $Self Care/Home Management : 38-52 mins   Barbie Banner, OTR/L   Adella Hare 01/30/2020, 1:07 PM

## 2020-01-30 NOTE — Discharge Instructions (Addendum)
Information on my medicine - ELIQUIS (apixaban)  Why was Eliquis prescribed for you? Eliquis was prescribed to treat blood clots that may have been found in the veins of your legs (deep vein thrombosis) or in your lungs (pulmonary embolism) and to reduce the risk of them occurring again.  What do You need to know about Eliquis ? The starting dose is 10 mg (two 5 mg tablets) taken TWICE daily for the FIRST SEVEN (7) DAYS, then on 02/06/20  the dose is reduced to ONE 5 mg tablet taken TWICE daily.  Eliquis may be taken with or without food.   Try to take the dose about the same time in the morning and in the evening. If you have difficulty swallowing the tablet whole please discuss with your pharmacist how to take the medication safely.     COVID-19 Frequently Asked Questions COVID-19 (coronavirus disease) is an infection that is caused by a large family of viruses. Some viruses cause illness in people and others cause illness in animals like camels, cats, and bats. In some cases, the viruses that cause illness in animals can spread to humans. Where did the coronavirus come from? In December 2019, [place] told the [employer] [facility]) of several cases of lung disease (human respiratory illness). These cases were linked to an open seafood and livestock market in the city of [place]. The link to the seafood and livestock market suggests that the virus may have spread from animals to humans. However, since that first outbreak in December, the virus has also been shown to spread from person to person. What is the name of the disease and the virus? Disease name Early on, this disease was called novel coronavirus. This is because scientists determined that the disease was caused by a new (novel) respiratory virus. The World Health Organization [facility]) has now named the disease COVID-19, or coronavirus disease. Virus name The virus that causes the disease is called severe acute respiratory  syndrome coronavirus 2 (SARS-CoV-2). More information on disease and virus naming World Health Organization [facility]): www.who.int/emergencies/diseases/novel-coronavirus-2019/technical-guidance/naming-the-coronavirus-disease-(covid-2019)-and-the-virus-that-causes-it Who is at risk for complications from coronavirus disease? Some people may be at higher risk for complications from coronavirus disease. This includes older adults and people who have chronic diseases, such as heart disease, diabetes, and lung disease. If you are at higher risk for complications, take these extra precautions:  Stay home as much as possible.  Avoid social gatherings and travel.  Avoid close contact with others. Stay at least 6 ft (2 m) away from others, if possible.  Wash your hands often with soap and water for at least 20 seconds.  Avoid touching your face, mouth, nose, or eyes.  Keep supplies on hand at home, such as food, medicine, and cleaning supplies.  If you must go out in public, wear a cloth face covering or face mask. Make sure your mask covers your nose and mouth. How does coronavirus disease spread? The virus that causes coronavirus disease spreads easily from person to person (is contagious). You may catch the virus by:  Breathing in droplets from an infected person. Droplets can be spread by a person breathing, speaking, singing, coughing, or sneezing.  Touching something, like a table or a doorknob, that was exposed to the virus (contaminated) and then touching your mouth, nose, or eyes. Can I get the virus from touching surfaces or objects? There is still a lot that we do not know about the virus that causes coronavirus disease. Scientists are basing a lot  of information on what they know about similar viruses, such as:  Viruses cannot generally survive on surfaces for long. They need a human body (host) to survive.  It is more likely that the virus is spread by close contact with people who are  sick (direct contact), such as through: ? Shaking hands or hugging. ? Breathing in respiratory droplets that travel through the air. Droplets can be spread by a person breathing, speaking, singing, coughing, or sneezing.  It is less likely that the virus is spread when a person touches a surface or object that has the virus on it (indirect contact). The virus may be able to enter the body if the person touches a surface or object and then touches his or her face, eyes, nose, or mouth. Can a person spread the virus without having symptoms of the disease? It may be possible for the virus to spread before a person has symptoms of the disease, but this is most likely not the main way the virus is spreading. It is more likely for the virus to spread by being in close contact with people who are sick and breathing in the respiratory droplets spread by a person breathing, speaking, singing, coughing, or sneezing. What are the symptoms of coronavirus disease? Symptoms vary from person to person and can range from mild to severe. Symptoms may include:  Fever or chills.  Cough.  Difficulty breathing or feeling short of breath.  Headaches, body aches, or muscle aches.  Runny or stuffy (congested) nose.  Sore throat.  New loss of taste or smell.  Nausea, vomiting, or diarrhea. These symptoms can appear anywhere from 2 to 14 days after you have been exposed to the virus. Some people may not have any symptoms. If you develop symptoms, call your health care provider. People with severe symptoms may need hospital care. Should I be tested for this virus? Your health care provider will decide whether to test you based on your symptoms, history of exposure, and your risk factors. How does a health care provider test for this virus? Health care providers will collect samples to send for testing. Samples may include:  Taking a swab of fluid from the back of your nose and throat, your nose, or your  throat.  Taking fluid from the lungs by having you cough up mucus (sputum) into a sterile cup.  Taking a blood sample. Is there a treatment or vaccine for this virus? Currently, there is no vaccine to prevent coronavirus disease. Also, there are no medicines like antibiotics or antivirals to treat the virus. A person who becomes sick is given supportive care, which means rest and fluids. A person may also relieve his or her symptoms by using over-the-counter medicines that treat sneezing, coughing, and runny nose. These are the same medicines that a person takes for the common cold. If you develop symptoms, call your health care provider. People with severe symptoms may need hospital care. What can I do to protect myself and my family from this virus?     You can protect yourself and your family by taking the same actions that you would take to prevent the spread of other viruses. Take the following actions:  Wash your hands often with soap and water for at least 20 seconds. If soap and water are not available, use alcohol-based hand sanitizer.  Avoid touching your face, mouth, nose, or eyes.  Cough or sneeze into a tissue, sleeve, or elbow. Do not cough or sneeze into  your hand or the air. ? If you cough or sneeze into a tissue, throw it away immediately and wash your hands.  Disinfect objects and surfaces that you frequently touch every day.  Stay away from people who are sick.  Avoid going out in public, follow guidance from your state and local health authorities.  Avoid crowded indoor spaces. Stay at least 6 ft (2 m) away from others.  If you must go out in public, wear a cloth face covering or face mask. Make sure your mask covers your nose and mouth.  Stay home if you are sick, except to get medical care. Call your health care provider before you get medical care. Your health care provider will tell you how long to stay home.  Make sure your vaccines are up to date. Ask your  health care provider what vaccines you need. What should I do if I need to travel? Follow travel recommendations from your local health authority, the CDC, and WHO. Travel information and advice  Centers for Disease Control and Prevention (CDC): BodyEditor.hu  World Health Organization [facility]): ThirdIncome.ca Know the risks and take action to protect your health  You are at higher risk of getting coronavirus disease if you are traveling to areas with an outbreak or if you are exposed to travelers from areas with an outbreak.  Wash your hands often and practice good hygiene to lower the risk of catching or spreading the virus. What should I do if I am sick? General instructions to stop the spread of infection  Wash your hands often with soap and water for at least 20 seconds. If soap and water are not available, use alcohol-based hand sanitizer.  Cough or sneeze into a tissue, sleeve, or elbow. Do not cough or sneeze into your hand or the air.  If you cough or sneeze into a tissue, throw it away immediately and wash your hands.  Stay home unless you must get medical care. Call your health care provider or local health authority before you get medical care.  Avoid public areas. Do not take public transportation, if possible.  If you can, wear a mask if you must go out of the house or if you are in close contact with someone who is not sick. Make sure your mask covers your nose and mouth. Keep your home clean  Disinfect objects and surfaces that are frequently touched every day. This may include: ? Counters and tables. ? Doorknobs and light switches. ? Sinks and faucets. ? Electronics such as phones, remote controls, keyboards, computers, and tablets.  Wash dishes in hot, soapy water or use a dishwasher. Air-dry your dishes.  Wash laundry in hot water. Prevent infecting other household  members  Let healthy household members care for children and pets, if possible. If you have to care for children or pets, wash your hands often and wear a mask.  Sleep in a different bedroom or bed, if possible.  Do not share personal items, such as razors, toothbrushes, deodorant, combs, brushes, towels, and washcloths. Where to find more information Centers for Disease Control and Prevention (CDC)  Information and news updates: https://www.butler-gonzalez.com/ World Health Organization [facility])  Information and news updates: MissExecutive.com.ee  Coronavirus health topic: https://www.castaneda.info/  Questions and answers on COVID-19: OpportunityDebt.at  Global tracker: who.sprinklr.com American Academy of Pediatrics (AAP)  Information for families: www.healthychildren.org/English/health-issues/conditions/chest-lungs/Pages/2019-Novel-Coronavirus.aspx The coronavirus situation is changing rapidly. Check your local health authority website or the CDC and [facility] websites for updates and news. When should  I contact a health care provider?  Contact your health care provider if you have symptoms of an infection, such as fever or cough, and you: ? Have been near anyone who is known to have coronavirus disease. ? Have come into contact with a person who is suspected to have coronavirus disease. ? Have traveled to an area where there is an outbreak of COVID-19. When should I get emergency medical care?  Get help right away by calling your local emergency services (911 in the [place] if you have: ? Trouble breathing. ? Pain or pressure in your chest. ? Confusion. ? Blue-tinged lips and fingernails. ? Difficulty waking from sleep. ? Symptoms that get worse. Let the emergency medical personnel know if you think you have coronavirus disease. Summary  A new respiratory virus is spreading from person to person and  causing COVID-19 (coronavirus disease).  The virus that causes COVID-19 appears to spread easily. It spreads from one person to another through droplets from breathing, speaking, singing, coughing, or sneezing.  Older adults and those with chronic diseases are at higher risk of disease. If you are at higher risk for complications, take extra precautions.  There is currently no vaccine to prevent coronavirus disease. There are no medicines, such as antibiotics or antivirals, to treat the virus.  You can protect yourself and your family by washing your hands often, avoiding touching your face, and covering your coughs and sneezes. This information is not intended to replace advice given to you by your health care provider. Make sure you discuss any questions you have with your health care provider. Document Revised: 10/10/2019 Document Reviewed: 04/08/2019 Elsevier Patient Education  2020 Elsevier Inc.  Take Eliquis exactly as prescribed and DO NOT stop taking Eliquis without talking to the doctor who prescribed the medication.  Stopping may increase your risk of developing a new blood clot.  Refill your prescription before you run out.  After discharge, you should have regular check-up appointments with your healthcare provider that is prescribing your Eliquis.    What do you do if you miss a dose? If a dose of ELIQUIS is not taken at the scheduled time, take it as soon as possible on the same day and twice-daily administration should be resumed. The dose should not be doubled to make up for a missed dose.  Important Safety Information A possible side effect of Eliquis is bleeding. You should call your healthcare provider right away if you experience any of the following: ? Bleeding from an injury or your nose that does not stop. ? Unusual colored urine (red or dark brown) or unusual colored stools (red or black). ? Unusual bruising for unknown reasons. ? A serious fall or if you hit  your head (even if there is no bleeding).  Some medicines may interact with Eliquis and might increase your risk of bleeding or clotting while on Eliquis. To help avoid this, consult your healthcare provider or pharmacist prior to using any new prescription or non-prescription medications, including herbals, vitamins, non-steroidal anti-inflammatory drugs (NSAIDs) and supplements.  This website has more information on Eliquis (apixaban): http://www.eliquis.com/eliquis/home

## 2020-01-30 NOTE — Evaluation (Signed)
Physical Therapy Evaluation Patient Details Name: Bruce Webb MRN: [id number] DOB: 05/22/1941 Today's Date: 01/30/2020   History of Present Illness  79 y.o. male with medical history significant of recent Covid infection DM2, HTN, Presented with  Severe hypoxia, Initially diagnosed on 1/22 with  COVID  Clinical Impression   Pt admitted with above dx and above hx. Pt states(via interpreter) that he was living home with family, he was able to ambulate on his own was able to bathe and dress himself and even fix himself some meals. This pm he seems slightly confused, he was found in rest room and therapist attempted to have interpreter help with hx and cues but pt seemed to be having two conversations one with therapist and one with interpreter. Functionally he did well with mobility able to use commode and complete peri care with stand by assist. Pt ambulated approx 86ft in room with no AD and also stand by assist. Once seated it was noted that pt had desat to 75% therapist attempted to educate on pursed lip breathing but had great difficulty with understanding cues to complete, therapist replaced 02 at 2L/min and pt minimally able to get up to high 80s but continued to fluctuate. Therapist titrated 02 to 6L to get to 90s and maintain sats in 90s. Pt needed stern cues to work on breathing first rather than attempting to find his clothes and trying to pack them in clothing bag. Pt will greatly benefit from continue PT tx while in hospital to address deficits noted in overall activity tolerance, independence and also safety with mobility. At d/c pt will need some supervision with mobility to monitor oxygenation as he shows quite poor awareness of this.      Follow Up Recommendations No PT follow up;Supervision for mobility/OOB;Supervision - Intermittent    Equipment Recommendations  None recommended by PT    Recommendations for Other Services       Precautions / Restrictions  Precautions Precautions: Fall Restrictions Weight Bearing Restrictions: No      Mobility  Bed Mobility Overal bed mobility: Needs Assistance       Supine to sit: Supervision        Transfers Overall transfer level: Needs assistance Equipment used: None Transfers: Sit to/from Stand;Stand Pivot Transfers Sit to Stand: Supervision Stand pivot transfers: Supervision          Ambulation/Gait Ambulation/Gait assistance: Supervision Gait Distance (Feet): 42 Feet Assistive device: None Gait Pattern/deviations: Step-through pattern;Wide base of support        Stairs            Wheelchair Mobility    Modified Rankin (Stroke Patients Only)       Balance Overall balance assessment: Mild deficits observed, not formally tested                                           Pertinent Vitals/Pain Pain Assessment: No/denies pain    Home Living Family/patient expects to be discharged to:: Private residence Living Arrangements: Spouse/significant other;Children Available Help at Discharge: Family Type of Home: House         Home Equipment: Gilmer Mor - single point Additional Comments: 5-10 stairs, unclear if there are in the home or stairs to the enterance     Prior Function Level of Independence: Independent with assistive device(s)         Comments: Walks with cane most of  time     Hand Dominance        Extremity/Trunk Assessment   Upper Extremity Assessment Upper Extremity Assessment: Generalized weakness    Lower Extremity Assessment Lower Extremity Assessment: Generalized weakness    Cervical / Trunk Assessment Cervical / Trunk Assessment: Normal  Communication   Communication: Prefers language other than English  Cognition Arousal/Alertness: Awake/alert Behavior During Therapy: WFL for tasks assessed/performed Overall Cognitive Status: No family/caregiver present to determine baseline cognitive functioning                                  General Comments: has diffiuclty with following interpreter seems to be attempting to hold two conversations one with therapist in [place] then one with interpreter      General Comments      Exercises     Assessment/Plan    PT Assessment Patient needs continued PT services  PT Problem List Decreased activity tolerance;Decreased balance;Decreased mobility;Decreased coordination;Decreased knowledge of use of DME;Decreased safety awareness       PT Treatment Interventions Gait training;Stair training;Functional mobility training;Therapeutic activities;Therapeutic exercise;Balance training;Neuromuscular re-education;Patient/family education    PT Goals (Current goals can be found in the Care Plan section)  Acute Rehab PT Goals Patient Stated Goal: Go home to family PT Goal Formulation: With patient Time For Goal Achievement: 02/13/20 Potential to Achieve Goals: Good    Frequency Min 3X/week   Barriers to discharge        Co-evaluation               AM-PAC PT "6 Clicks" Mobility  Outcome Measure Help needed turning from your back to your side while in a flat bed without using bedrails?: None Help needed moving from lying on your back to sitting on the side of a flat bed without using bedrails?: None Help needed moving to and from a bed to a chair (including a wheelchair)?: A Little Help needed standing up from a chair using your arms (e.g., wheelchair or bedside chair)?: A Little Help needed to walk in hospital room?: A Little Help needed climbing 3-5 steps with a railing? : A Little 6 Click Score: 20    End of Session Equipment Utilized During Treatment: Oxygen Activity Tolerance: Patient limited by fatigue;Patient limited by lethargy;Treatment limited secondary to medical complications (Comment) Patient left: in chair;with call bell/phone within reach;with chair alarm set Nurse Communication: Mobility status PT Visit Diagnosis:  Unsteadiness on feet (R26.81);Other abnormalities of gait and mobility (R26.89)    Time: 5974-1638 PT Time Calculation (min) (ACUTE ONLY): 22 min   Charges:   PT Evaluation $PT Eval Moderate Complexity: 1 Mod PT Treatments $Therapeutic Activity: 8-22 mins        Horald Chestnut, PT   Delford Field 01/30/2020, 5:12 PM

## 2020-01-30 NOTE — Progress Notes (Signed)
Son, Arlis Yale, updated on pt condition. O2 weaned to 3-2 liters [place] with spo2 varying 82-94%. Up to recliner with physical therapy assist. Gujarati, per [place] language, interpreter used for translation.

## 2020-01-30 NOTE — Progress Notes (Signed)
PROGRESS NOTE    Bruce Webb  OZD:[id number] DOB: 1941-06-05 DOA: 01/26/2020 PCP: Ignatius Specking, MD   Brief Narrative:  79 y.o.malewith medical history significant of recent Covid infection DM2, HTN, Presented withSevere hypoxia, Initially diagnosedon 1/22 with COVID , bamlanivimab infusionwas given at [facility] on 01/23/2020 was started on steroids butcontinued to have worsening shortness of breath.  Noted to be hypoxic by EMS 68% on room air, was 80% on room air in ED, was admitted to [facility] for further management .  Upon admission patient was started on Decadron and remdesivir.  Also received outpatient infusion for bamlanivimab.    Assessment & Plan:   Principal Problem:   Acute respiratory failure due to COVID-19 [facility]) Active Problems:   Pneumonia due to COVID-19 virus   Essential hypertension   DM (diabetes mellitus), type 2 (HCC)   Acute pulmonary embolism without acute cor pulmonale (HCC)  Acute hypoxic respiratory distress secondary to COVID-19 pneumonia -Oxygen levels-oxygen fluctuating between 88-95%.  Requiring 2-4 L nasal cannula.  Not on any home oxygen.  Slowly improving. -Remdesivir-day 4 -Decadrone-day 4 -Routine: Labs have been reviewed including ferritin, LDH, CRP, d-dimer, fibrinogen.  Will need to trend this lab daily. -Vitamin C & Zinc. Prone >16hrs/day.  -procalcitonin-negative -Chest x-ray-multifocal pneumonia -Supportive care-antitussive, inhalers, I-S/flutter -CODE STATUS confirmed  Subacute pulmonary embolism without cor pulmonale -Daily Eliquis  Essential hypertension -Metoprolol 12.5 mg twice daily, lisinopril  Diabetes mellitus type 2 -Accu-Chek sliding scale -Tradjenta  Vitamin B12 deficiency anemia Iron Def -Supplements, Bowel Regimen   Constipation - Bowel regimen.   PT/OT-pending  DVT prophylaxis: Treatment dose Lovenox, transition to Eliquis Code Status: Full code Family Communication: Spoke with his son Raliegh Ip again today.   Disposition Plan:   Patient From= home  Patient Anticipated D/C place= Home  Barriers= still has slight dyspnea with oxygen requirement of 2-4 L.  Not any home oxygen.  1 more day of remdesivir left tomorrow, likely discharge thereafter.  Consultants:   None  Procedures:   None  Antimicrobials:   None   Subjective: Seen and examined at bedside, no complaints overall feels okay.  Still has some exertional dyspnea and none 2-4 L nasal cannula with occasional desaturation down to 85%.  Review of Systems Otherwise negative except as per HPI, including: General = no fevers, chills, dizziness,  fatigue HEENT/EYES = negative for loss of vision, double vision, blurred vision,  sore throa Cardiovascular= negative for chest pain, palpitation Respiratory/lungs= negative for shortness of breath, cough, wheezing; hemoptysis,  Gastrointestinal= negative for nausea, vomiting, abdominal pain Genitourinary= negative for Dysuria MSK = Negative for arthralgia, myalgias Neurology= Negative for headache, numbness, tingling  Psychiatry= Negative for suicidal and homocidal ideation Skin= Negative for Rash  Examination: Constitutional: Not in acute distress, 2 L nasal cannula Respiratory: Clear to auscultation bilaterally Cardiovascular: Normal sinus rhythm, no rubs Abdomen: Nontender nondistended good bowel sounds Musculoskeletal: No edema noted Skin: No rashes seen Neurologic: CN 2-12 grossly intact.  And nonfocal Psychiatric: Normal judgment and insight. Alert and oriented x 3. Normal mood.   Objective: Vitals:   01/30/20 0000 01/30/20 0438 01/30/20 0803 01/30/20 0805  BP: (!) 145/77 126/72 (!) 99/54 (!) 99/54  Pulse: 70 67 67 62  Resp: 15 20 19 19   Temp: 98.7 F (37.1 C) 97.8 F (36.6 C) 98.7 F (37.1 C)   TempSrc: Oral Oral Oral   SpO2: 100% 100% 97% 93%  Weight:      Height:        Intake/Output  Summary (Last 24 hours) at 01/30/2020 1036 Last data filed at 01/30/2020  0913 Gross per 24 hour  Intake 485 ml  Output 780 ml  Net -295 ml   Filed Weights   01/26/20 2049  Weight: 67.1 kg     Data Reviewed:   CBC: Recent Labs  Lab 01/26/20 2151 01/27/20 0419 01/28/20 0303 01/29/20 0350 01/30/20 0114  WBC 14.1* 12.3* 23.4* 25.7* 21.1*  NEUTROABS 11.9* 11.3* 20.7* 22.9* 19.0*  HGB 9.3* 9.5* 8.8* 9.5* 9.0*  HCT 29.9* 29.5* 27.1* 29.7* 27.8*  MCV 87.4 86.3 84.7 85.1 84.5  PLT 151 153 174 229 063*   Basic Metabolic Panel: Recent Labs  Lab 01/26/20 2151 01/27/20 0419 01/28/20 0303 01/29/20 0350 01/30/20 0114  NA 138 135 136 135 136  K 4.5 4.4 4.3 3.9 4.6  CL 105 104 106 101 105  CO2 24 22 23 25 25   GLUCOSE 289* 283* 162* 98 98  BUN 30* 29* 39* 44* 42*  CREATININE 1.07 0.95 0.81 0.90 0.95  CALCIUM 8.6* 8.0* 8.3* 8.4* 8.2*  MG  --   --   --  2.0 2.0   GFR: Estimated Creatinine Clearance: 54.9 mL/min (by C-G formula based on SCr of 0.95 mg/dL). Liver Function Tests: Recent Labs  Lab 01/26/20 2151 01/27/20 0419 01/28/20 0303 01/29/20 0350 01/30/20 0114  AST 37 29 36 47* 48*  ALT 57* 45* 50* 65* 76*  ALKPHOS 79 67 69 73 83  BILITOT 0.9 0.5 0.5 0.8 0.5  PROT 7.0 6.3* 5.5* 6.0* 5.6*  ALBUMIN 3.0* 2.6* 2.2* 2.6* 2.4*   No results for input(s): LIPASE, AMYLASE in the last 168 hours. No results for input(s): AMMONIA in the last 168 hours. Coagulation Profile: No results for input(s): INR, PROTIME in the last 168 hours. Cardiac Enzymes: No results for input(s): CKTOTAL, CKMB, CKMBINDEX, TROPONINI in the last 168 hours. BNP (last 3 results) No results for input(s): PROBNP in the last 8760 hours. HbA1C: No results for input(s): HGBA1C in the last 72 hours. CBG: Recent Labs  Lab 01/29/20 1622 01/29/20 1940 01/30/20 0002 01/30/20 0439 01/30/20 0749  GLUCAP 272* 214* 124* 94 83   Lipid Profile: No results for input(s): CHOL, HDL, LDLCALC, TRIG, CHOLHDL, LDLDIRECT in the last 72 hours. Thyroid Function Tests: No results for  input(s): TSH, T4TOTAL, FREET4, T3FREE, THYROIDAB in the last 72 hours. Anemia Panel: No results for input(s): VITAMINB12, FOLATE, FERRITIN, TIBC, IRON, RETICCTPCT in the last 72 hours. Sepsis Labs: Recent Labs  Lab 01/26/20 2151 01/27/20 0118  PROCALCITON <0.10  --   LATICACIDVEN 2.1* 1.7    Recent Results (from the past 240 hour(s))  Blood Culture (routine x 2)     Status: None (Preliminary result)   Collection Time: 01/26/20  9:51 PM   Specimen: BLOOD  Result Value Ref Range Status   Specimen Description   Final    BLOOD RIGHT ANTECUBITAL Performed at [facility], [place] [address], [place], [place] [address]    Special Requests   Final    BOTTLES DRAWN AEROBIC AND ANAEROBIC Blood Culture adequate volume Performed at [facility] [address], [place], [place] [address]    Culture   Final    NO GROWTH 2 DAYS Performed at [facility] [address], [place], [place] [address]    Report Status PENDING  Incomplete  Blood Culture (routine x 2)     Status: None (Preliminary result)   Collection Time: 01/27/20  1:18  AM   Specimen: BLOOD  Result Value Ref Range Status   Specimen Description   Final    BLOOD BLOOD LEFT FOREARM Performed at [facility], [address], [place], [place] [address]    Special Requests   Final    BOTTLES DRAWN AEROBIC AND ANAEROBIC Blood Culture results may not be optimal due to an excessive volume of blood received in culture bottles Performed at [facility], [address] [place], [place] [address]    Culture   Final    NO GROWTH 2 DAYS Performed at [facility], [address], [place], [place] [address]    Report Status PENDING  Incomplete         Radiology Studies: No results found.      Scheduled Meds: . acetaminophen  650 mg Oral Once  . apixaban  10 mg Oral BID  . vitamin C  500 mg Oral Daily  . cyanocobalamin  1,000 mcg  Intramuscular Weekly  . dexamethasone (DECADRON) injection  6 mg Intravenous Daily  . ferrous sulfate  325 mg Oral BID WC  . insulin aspart  0-15 Units Subcutaneous Q4H  . insulin aspart  6 Units Subcutaneous TID WC  . insulin detemir  0.075 Units/kg Subcutaneous BID  . linagliptin  5 mg Oral Daily  . lisinopril  10 mg Oral Daily  . metoprolol tartrate  12.5 mg Oral BID  . pantoprazole  40 mg Oral Daily  . sodium chloride flush  3 mL Intravenous Q12H  . sodium chloride flush  3 mL Intravenous Q12H  . zinc sulfate  220 mg Oral Daily   Continuous Infusions: . sodium chloride    . remdesivir 100 mg in NS 100 mL Stopped (01/30/20 0913)     LOS: 3 days   Time spent= 25 mins    Ruffin Lada Joline Maxcy, MD [facility]  If 7PM-7AM, please contact night-coverage  01/30/2020, 10:36 AM

## 2020-01-31 LAB — CBC WITH DIFFERENTIAL/PLATELET
Abs Immature Granulocytes: 0.43 10*3/uL — ABNORMAL HIGH (ref 0.00–0.07)
Basophils Absolute: 0 10*3/uL (ref 0.0–0.1)
Basophils Relative: 0 %
Eosinophils Absolute: 0 10*3/uL (ref 0.0–0.5)
Eosinophils Relative: 0 %
HCT: 27.5 % — ABNORMAL LOW (ref 39.0–52.0)
Hemoglobin: 8.8 g/dL — ABNORMAL LOW (ref 13.0–17.0)
Immature Granulocytes: 2 %
Lymphocytes Relative: 3 %
Lymphs Abs: 0.7 10*3/uL (ref 0.7–4.0)
MCH: 27.2 pg (ref 26.0–34.0)
MCHC: 32 g/dL (ref 30.0–36.0)
MCV: 85.1 fL (ref 80.0–100.0)
Monocytes Absolute: 1.3 10*3/uL — ABNORMAL HIGH (ref 0.1–1.0)
Monocytes Relative: 6 %
Neutro Abs: 18.2 10*3/uL — ABNORMAL HIGH (ref 1.7–7.7)
Neutrophils Relative %: 89 %
Platelets: 123 10*3/uL — ABNORMAL LOW (ref 150–400)
RBC: 3.23 MIL/uL — ABNORMAL LOW (ref 4.22–5.81)
RDW: 14.8 % (ref 11.5–15.5)
WBC: 20.7 10*3/uL — ABNORMAL HIGH (ref 4.0–10.5)
nRBC: 0 % (ref 0.0–0.2)

## 2020-01-31 LAB — D-DIMER, QUANTITATIVE: D-Dimer, Quant: >20 ug/mL-FEU — ABNORMAL HIGH (ref 0.00–0.50)

## 2020-01-31 LAB — COMPREHENSIVE METABOLIC PANEL
ALT: 63 U/L — ABNORMAL HIGH (ref 0–44)
AST: 33 U/L (ref 15–41)
Albumin: 2.4 g/dL — ABNORMAL LOW (ref 3.5–5.0)
Alkaline Phosphatase: 80 U/L (ref 38–126)
Anion gap: 8 (ref 5–15)
BUN: 40 mg/dL — ABNORMAL HIGH (ref 8–23)
CO2: 24 mmol/L (ref 22–32)
Calcium: 8.1 mg/dL — ABNORMAL LOW (ref 8.9–10.3)
Chloride: 100 mmol/L (ref 98–111)
Creatinine, Ser: 0.89 mg/dL (ref 0.61–1.24)
GFR calc Af Amer: >60 mL/min (ref >60)
GFR calc non Af Amer: >60 mL/min (ref >60)
Glucose, Bld: 98 mg/dL (ref 70–99)
Potassium: 4.5 mmol/L (ref 3.5–5.1)
Sodium: 132 mmol/L — ABNORMAL LOW (ref 135–145)
Total Bilirubin: 0.9 mg/dL (ref 0.3–1.2)
Total Protein: 5.5 g/dL — ABNORMAL LOW (ref 6.5–8.1)

## 2020-01-31 LAB — MAGNESIUM: Magnesium: 2 mg/dL (ref 1.7–2.4)

## 2020-01-31 LAB — C-REACTIVE PROTEIN: CRP: 7.7 mg/dL — ABNORMAL HIGH (ref <1.0)

## 2020-01-31 LAB — GLUCOSE, CAPILLARY
Glucose-Capillary: 99 mg/dL (ref 70–99)
Glucose-Capillary: 333 mg/dL — ABNORMAL HIGH (ref 70–99)

## 2020-01-31 MED ORDER — POLYETHYLENE GLYCOL 3350 17 G PO PACK: 17.0000 g | PACK | Freq: Every day | ORAL | 0 refills | Status: DC | PRN

## 2020-01-31 MED ORDER — IPRATROPIUM-ALBUTEROL 20-100 MCG/ACT IN AERS: 1.0000 | INHALATION_SPRAY | Freq: Four times a day (QID) | RESPIRATORY_TRACT | 1 refills | Status: DC | PRN

## 2020-01-31 MED ORDER — APIXABAN 5 MG PO TABS: ORAL_TABLET | ORAL | 0 refills | Status: DC

## 2020-01-31 MED ORDER — APIXABAN 5 MG PO TABS: 5.0000 mg | ORAL_TABLET | Freq: Two times a day (BID) | ORAL | Status: DC

## 2020-01-31 MED ORDER — DEXAMETHASONE 4 MG PO TABS: 4.0000 mg | ORAL_TABLET | Freq: Every day | ORAL | 0 refills | Status: AC

## 2020-01-31 MED ORDER — SENNOSIDES-DOCUSATE SODIUM 8.6-50 MG PO TABS: 2.0000 | ORAL_TABLET | Freq: Every evening | ORAL | 1 refills | Status: DC | PRN

## 2020-01-31 MED ORDER — DEXAMETHASONE 6 MG PO TABS: 6.0000 mg | ORAL_TABLET | Freq: Every day | ORAL | 0 refills | Status: DC

## 2020-01-31 MED ORDER — FERROUS SULFATE 325 (65 FE) MG PO TABS: 325.0000 mg | ORAL_TABLET | Freq: Two times a day (BID) | ORAL | 1 refills | Status: DC

## 2020-01-31 NOTE — Plan of Care (Signed)
Had a good night.  Tolerating oxygen at 2.5L with no difficulty.  Problem: Respiratory: Goal: Will maintain a patent airway Outcome: Progressing

## 2020-01-31 NOTE — Progress Notes (Signed)
Patient discharged, instructions, f/u appts, prescription information and at home Covid guidelines given to to patient's grandson, Cato Mulligan. Grandson verbalizes understanding and agrees to follow Covid guidelines. Patient in room awaiting pick up.

## 2020-01-31 NOTE — Discharge Summary (Signed)
Physician Discharge Summary  Lindley Hiney [facility]:[id number] DOB: [birth year] DOA: 01/26/2020  PCP: Ignatius Specking, MD  Admit date: 01/26/2020 Discharge date: 01/31/2020  Admitted From: Home Disposition: Home  Recommendations for Outpatient Follow-up:  1. Follow up with PCP in 1-2 weeks 2. Please obtain BMP/CBC in one week your next doctors visit.  3. Eliquis 10 mg twice daily for 5 days thereafter 5 mg twice daily 4. Iron supplements with bowel regimen 5. 5 days of steroids.  Bronchodilator prescribed.  Home Health: None Equipment/Devices: Home oxygen 2 L Discharge Condition: Stable CODE STATUS: Full code Diet recommendation: Diabetic/2 g salt  Brief/Interim Summary: 79 y.o.malewith medical history significant of recent Covid infection DM2, HTN,Presented withSevere hypoxia,Initially diagnosedon 1/22 with COVID , b[facility]lanivimab infusionwas given at [facility] on 01/23/2020 was started on steroids butcontinued to have worsening shortness of breath.Noted to be hypoxic by EMS 68% on room air, was 80% on room air in ED, was admitted to [facility] for further management .  Upon admission patient was started on Decadron and remdesivir.  Also received outpatient infusion for bamlanivimab.  During the hospitalization initially required 4-6 L nasal cannula which was slowly weaned off.  CTA chest was positive for subacute pulmonary embolism without cor pulmonale, initially on Lovenox then transition to Eliquis.  Over the course of 5 days he significantly improved in the hospital.  On the day of discharge he was requiring 2 L of nasal cannula therefore arrangements were made.  He was seen by physical therapy and did well with them. Family has been updated.  Stable for discharge today.   Acute hypoxic respiratory distress secondary to COVID-19 pneumonia -This is pretty much resolved.  Now on 2 L nasal cannula especially with ambulation therefore arrangements for home oxygen will be made. -Bronchodilators  prescribed.  5 more days of oral steroids.  Completed remdesivir in the hospital. -Supplemental oxygen given at home 2 L nasal cannula. -Encouraged to continue using incentive spirometer and flutter valve at home.  Subacute pulmonary embolism without cor pulmonale -Daily Eliquis 10 mg twice daily for 5 more days then transition to 5 mg twice daily.  Essential hypertension -Metoprolol 12.5 mg twice daily, lisinopril  Diabetes mellitus type 2 -Diet controlled  Vitamin B12 deficiency anemia Iron Def -Supplements, Bowel Regimen   Constipation - Bowel regimen.   Seen by PT-recommends no follow-up necessary Care discussed very detail with the family member.  All the questions answered.  Discharge Diagnoses:  Principal Problem:   Acute respiratory failure due to COVID-19 [facility]) Active Problems:   Pneumonia due to COVID-19 virus   Essential hypertension   DM (diabetes mellitus), type 2 (HCC)   Acute pulmonary embolism without acute cor pulmonale (HCC)    Consultations:  None  Subjective: Feels better, no complaints.  Discharge Exam: Vitals:   01/31/20 0427 01/31/20 0749  BP: (!) 106/53 (!) 91/52  Pulse: 60 66  Resp: 20 19  Temp: 99 F (37.2 C) 98.8 F (37.1 C)  SpO2: 95% 91%   Vitals:   01/30/20 1945 01/31/20 0000 01/31/20 0427 01/31/20 0749  BP: (!) 116/58 (!) 129/58 (!) 106/53 (!) 91/52  Pulse: 73  60 66  Resp: 20  20 19   Temp: 98.8 F (37.1 C) (!) 97.1 F (36.2 C) 99 F (37.2 C) 98.8 F (37.1 C)  TempSrc: Oral Oral Oral Oral  SpO2: 96%  95% 91%  Weight:      Height:        General: Pt is alert, awake,  not in acute distress, 2 L nasal cannula Cardiovascular: RRR, S1/S2 +, no rubs, no gallops Respiratory: CTA bilaterally, no wheezing, no rhonchi Abdominal: Soft, NT, ND, bowel sounds + Extremities: no edema, no cyanosis  Discharge Instructions  Discharge Instructions    Diet - low sodium heart healthy   Complete by: As directed    Discharge  instructions   Complete by: As directed    You were cared for by a hospitalist during your hospital stay. If you have any questions about your discharge medications or the care you received while you were in the hospital after you are discharged, you can call the unit and asked to speak with the hospitalist on call if the hospitalist that took care of you is not available. Once you are discharged, your primary care physician will handle any further medical issues. Please note that NO REFILLS for any discharge medications will be authorized once you are discharged, as it is imperative that you return to your primary care physician (or establish a relationship with a primary care physician if you do not have one) for your aftercare needs so that they can reassess your need for medications and monitor your lab values.  Please request your Prim.MD to go over all Hospital Tests and Procedure/Radiological results at the follow up, please get all Hospital records sent to your Prim MD by signing hospital release before you go home.  Get CBC, CMP, 2 view Chest X ray checked  by Primary MD during your next visit or SNF MD in 5-7 days ( we routinely change or add medications that can affect your baseline labs and fluid status, therefore we recommend that you get the mentioned basic workup next visit with your PCP, your PCP may decide not to get them or add new tests based on their clinical decision)  On your next visit with your primary care physician please Get Medicines reviewed and adjusted.  If you experience worsening of your admission symptoms, develop shortness of breath, life threatening emergency, suicidal or homicidal thoughts you must seek medical attention immediately by calling 911 or calling your MD immediately  if symptoms less severe.  You Must read complete instructions/literature along with all the possible adverse reactions/side effects for all the Medicines you take and that have been prescribed  to you. Take any new Medicines after you have completely understood and accpet all the possible adverse reactions/side effects.   Do not drive, operate heavy machinery, perform activities at heights, swimming or participation in water activities or provide baby sitting services if your were admitted for syncope or siezures until you have seen by Primary MD or a Neurologist and advised to do so again.  Do not drive when taking Pain medications.   Increase activity slowly   Complete by: As directed      Allergies as of 01/31/2020   No Known Allergies     Medication List    TAKE these medications   acetaminophen 325 MG tablet Commonly known as: TYLENOL Take 650 mg by mouth every 6 (six) hours as needed for mild pain or fever.   apixaban 5 MG Tabs tablet Commonly known as: ELIQUIS Take 2 tablets (10 mg total) by mouth 2 (two) times daily for 5 days, THEN 1 tablet (5 mg total) 2 (two) times daily for 25 days. Start taking on: January 31, 2020   dexamethasone 4 MG tablet Commonly known as: DECADRON Take 4 mg by mouth daily. [id number] What changed: Another medication with the  same name was added. Make sure you understand how and when to take each.   dexamethasone 6 MG tablet Commonly known as: DECADRON Take 1 tablet (6 mg total) by mouth daily for 5 days. What changed: You were already taking a medication with the same name, and this prescription was added. Make sure you understand how and when to take each.   ferrous sulfate 325 (65 FE) MG tablet Take 1 tablet (325 mg total) by mouth 2 (two) times daily with a meal.   HYDROcodone-homatropine 5-1.5 MG/5ML syrup Commonly known as: HYCODAN Take 5 mLs by mouth 2 (two) times daily as needed for cough.   Ipratropium-Albuterol 20-100 MCG/ACT Aers respimat Commonly known as: COMBIVENT Inhale 1 puff into the lungs every 6 (six) hours as needed for wheezing.   lisinopril 10 MG tablet Commonly known as: ZESTRIL Take 10-20 mg by mouth See  admin instructions. Take 20mg  in am & 10mg  in evening   metoprolol tartrate 50 MG tablet Commonly known as: LOPRESSOR Take 50 mg by mouth daily.   omeprazole 40 MG capsule Commonly known as: PRILOSEC Take 40 mg by mouth daily.   polyethylene glycol 17 g packet Commonly known as: MIRALAX / GLYCOLAX Take 17 g by mouth daily as needed for moderate constipation or severe constipation.   senna-docusate 8.6-50 MG tablet Commonly known as: Senokot-S Take 2 tablets by mouth at bedtime as needed for mild constipation or moderate constipation.            Durable Medical Equipment  (From admission, onward)         Start     Ordered   01/31/20 0943  DME Oxygen  Once    Question Answer Comment  Length of Need 6 Months   Mode or (Route) Nasal cannula   Liters per Minute 2   Frequency Continuous (stationary and portable oxygen unit needed)   Oxygen conserving device No   Oxygen delivery system Gas      01/31/20 0943         Follow-up Information    Doreen BeamVyas, Dhruv B, MD. Schedule an appointment as soon as possible for a visit in 2 week(s).   Specialty: Internal Medicine Contact information: [address] [place] [id number] [phone]          No Known Allergies  You were cared for by a hospitalist during your hospital stay. If you have any questions about your discharge medications or the care you received while you were in the hospital after you are discharged, you can call the unit and asked to speak with the hospitalist on call if the hospitalist that took care of you is not available. Once you are discharged, your primary care physician will handle any further medical issues. Please note that no refills for any discharge medications will be authorized once you are discharged, as it is imperative that you return to your primary care physician (or establish a relationship with a primary care physician if you do not have one) for your aftercare needs so that they can reassess  your need for medications and monitor your lab values.   Procedures/Studies: CT ANGIO CHEST PE W OR WO CONTRAST  Result Date: 01/27/2020 CLINICAL DATA:  COVID-19 positivity with increased cough EXAM: CT ANGIOGRAPHY CHEST WITH CONTRAST TECHNIQUE: Multidetector CT imaging of the chest was performed using the standard protocol during bolus administration of intravenous contrast. Multiplanar CT image reconstructions and MIPs were obtained to evaluate the vascular anatomy. CONTRAST:  100mL  OMNIPAQUE IOHEXOL 350 MG/ML SOLN COMPARISON:  None. FINDINGS: Cardiovascular: Thoracic aorta is within normal limits within normal branching pattern. No cardiac enlargement is seen. Scattered small filling defects are noted within the pulmonary artery on the right. No right heart strain is noted. 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 Mediastinum/Nodes: Thoracic inlet is within normal limits. No hilar or mediastinal adenopathy is noted. The esophagus as visualized is within limits. Lungs/Pleura: Lungs are well aerated bilaterally with predominantly peripheral ground-glass opacities consistent with given clinical history of COVID-19 positivity trees no effusion is seen. No parenchymal nodules noted. Upper Abdomen: Visualized upper abdomen is within normal limits. Musculoskeletal: Degenerative changes of the thoracic spine are seen. No acute bony abnormality is noted. Review of the MIP images confirms the above findings. 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 No right heart strain is noted. Bilateral ground-glass opacities consistent with the given clinical history of COVID-19 positivity. Electronically Signed   By: Alcide Clever M.D.   On: 01/27/2020 02:14   DG Chest Port 1 View  Result Date: 01/26/2020 CLINICAL DATA:  79 year old male with shortness of breath. EXAM: PORTABLE CHEST 1 VIEW COMPARISON:  None. FINDINGS: Bilateral predominantly  peripheral and subpleural interstitial coarsening and hazy densities concerning for infiltrate, possibly viral or atypical including COVID-19. Clinical correlation is recommended. No lobar consolidation, pleural effusion, or pneumothorax. The cardiac silhouette is within normal limits. No acute osseous pathology. IMPRESSION: Findings concerning for multifocal pneumonia, possibly viral or atypical in etiology. Clinical correlation and follow-up recommended. Electronically Signed   By: Elgie Collard M.D.   On: 01/26/2020 22:17      The results of significant diagnostics from this hospitalization (including imaging, microbiology, ancillary and laboratory) are listed below for reference.     Microbiology: Recent Results (from the past 240 hour(s))  Blood Culture (routine x 2)     Status: None (Preliminary result)   Collection Time: 01/26/20  9:51 PM   Specimen: BLOOD  Result Value Ref Range Status   Specimen Description   Final    BLOOD RIGHT ANTECUBITAL Performed at [facility], [address], [place], [place] [address]    Special Requests   Final    BOTTLES DRAWN AEROBIC AND ANAEROBIC Blood Culture adequate volume Performed at [facility], [address], [place], [place] [address]    Culture   Final    NO GROWTH 4 DAYS Performed at [facility], [address], [place], [place] [address]    Report Status PENDING  Incomplete  Blood Culture (routine x 2)     Status: None (Preliminary result)   Collection Time: 01/27/20  1:18 AM   Specimen: BLOOD  Result Value Ref Range Status   Specimen Description   Final    BLOOD BLOOD LEFT FOREARM Performed at [facility], [address], [place], [place] [address]    Special Requests   Final    BOTTLES DRAWN AEROBIC AND ANAEROBIC Blood Culture results may not be optimal due to an excessive volume of blood received in culture bottles Performed at [facility],  [address] [place], [place] [address]    Culture   Final    NO GROWTH 4 DAYS Performed at [facility], [address] [place], [place] [address]    Report Status PENDING  Incomplete     Labs: BNP (last 3 results) No results for  input(s): BNP in the last 8760 hours. Basic Metabolic Panel: Recent Labs  Lab 01/27/20 0419 01/28/20 0303 01/29/20 0350 01/30/20 0114 01/31/20 0107  NA 135 136 135 136 132*  K 4.4 4.3 3.9 4.6 4.5  CL 104 106 101 105 100  CO2 22 23 25 25 24   GLUCOSE 283* 162* 98 98 98  BUN 29* 39* 44* 42* 40*  CREATININE 0.95 0.81 0.90 0.95 0.89  CALCIUM 8.0* 8.3* 8.4* 8.2* 8.1*  MG  --   --  2.0 2.0 2.0   Liver Function Tests: Recent Labs  Lab 01/27/20 0419 01/28/20 0303 01/29/20 0350 01/30/20 0114 01/31/20 0107  AST 29 36 47* 48* 33  ALT 45* 50* 65* 76* 63*  ALKPHOS 67 69 73 83 80  BILITOT 0.5 0.5 0.8 0.5 0.9  PROT 6.3* 5.5* 6.0* 5.6* 5.5*  ALBUMIN 2.6* 2.2* 2.6* 2.4* 2.4*   No results for input(s): LIPASE, AMYLASE in the last 168 hours. No results for input(s): AMMONIA in the last 168 hours. CBC: Recent Labs  Lab 01/27/20 0419 01/28/20 0303 01/29/20 0350 01/30/20 0114 01/31/20 0107  WBC 12.3* 23.4* 25.7* 21.1* 20.7*  NEUTROABS 11.3* 20.7* 22.9* 19.0* 18.2*  HGB 9.5* 8.8* 9.5* 9.0* 8.8*  HCT 29.5* 27.1* 29.7* 27.8* 27.5*  MCV 86.3 84.7 85.1 84.5 85.1  PLT 153 174 229 145* 123*   Cardiac Enzymes: No results for input(s): CKTOTAL, CKMB, CKMBINDEX, TROPONINI in the last 168 hours. BNP: Invalid input(s): POCBNP CBG: Recent Labs  Lab 01/30/20 0749 01/30/20 1215 01/30/20 1628 01/30/20 1947 01/31/20 0750  GLUCAP 83 202* 267* 218* 99   D-Dimer Recent Labs    01/30/20 0114 01/31/20 0107  DDIMER >20.00* >20.00*   Hgb A1c No results for input(s): HGBA1C in the last 72 hours. Lipid Profile No results for input(s): CHOL, HDL, LDLCALC, TRIG, CHOLHDL, LDLDIRECT in the last 72 hours. Thyroid function studies No results for  input(s): TSH, T4TOTAL, T3FREE, THYROIDAB in the last 72 hours.  Invalid input(s): FREET3 Anemia work up No results for input(s): VITAMINB12, FOLATE, FERRITIN, TIBC, IRON, RETICCTPCT in the last 72 hours. Urinalysis No results found for: COLORURINE, APPEARANCEUR, LABSPEC, PHURINE, GLUCOSEU, HGBUR, BILIRUBINUR, KETONESUR, PROTEINUR, UROBILINOGEN, NITRITE, LEUKOCYTESUR Sepsis Labs Invalid input(s): PROCALCITONIN,  WBC,  LACTICIDVEN Microbiology Recent Results (from the past 240 hour(s))  Blood Culture (routine x 2)     Status: None (Preliminary result)   Collection Time: 01/26/20  9:51 PM   Specimen: BLOOD  Result Value Ref Range Status   Specimen Description   Final    BLOOD RIGHT ANTECUBITAL Performed at [facility], [address] [place], [place]    Special Requests   Final    BOTTLES DRAWN AEROBIC AND ANAEROBIC Blood Culture adequate volume Performed at [facility], [address] [place], [place]    Culture   Final    NO GROWTH 4 DAYS Performed at [facility], [address], [place], [place]    Report Status PENDING  Incomplete  Blood Culture (routine x 2)     Status: None (Preliminary result)   Collection Time: 01/27/20  1:18 AM   Specimen: BLOOD  Result Value Ref Range Status   Specimen Description   Final    BLOOD BLOOD LEFT FOREARM Performed at [facility], [address], [place], [place]    Special Requests   Final    BOTTLES DRAWN AEROBIC AND ANAEROBIC Blood Culture results may  not be optimal due to an excessive volume of blood received in culture bottles Performed at [place] [address] [place], [place] [address]    Culture   Final    NO GROWTH 4 DAYS Performed at [facility], [place] [address] [place], [place] [address]    Report Status PENDING  Incomplete     Time coordinating discharge:  I have spent 35 minutes  face to face with the patient and on the ward discussing the patients care, assessment, plan and disposition with other care givers. >50% of the time was devoted counseling the patient about the risks and benefits of treatment/Discharge disposition and coordinating care.   SIGNED:   Damita Lack, MD  [facility] 01/31/2020, 9:47 AM   If 7PM-7AM, please contact night-coverage

## 2020-01-31 NOTE — Progress Notes (Signed)
SATURATION QUALIFICATIONS: (This note is used to comply with regulatory documentation for home oxygen)  Patient Saturations on Room Air at Rest = 88%  Patient Saturations on Room Air while Ambulating = 84%  Patient Saturations on 2.5 Liters of oxygen while Ambulating = 90%  Please briefly explain why patient needs home oxygen:

## 2020-01-31 NOTE — TOC Transition Note (Signed)
Transition of Care [facility]) - CM/SW Discharge Note   Patient Details  Name: Bruce Webb MRN: [id number] Date of Birth: 09/15/1941  Transition of Care [facility] CM/SW Contact:  Truddie Hidden, LCSW Phone Number: 01/31/2020, 11:25 AM   Clinical Narrative:     Discharged home with oxygen. Portable tank and concentrator will delivered to patient's home. Eliane Decree verified that he will accept delivery.   Patient's grandson will provide transport. Patient aware and agreeable to this plan. No other needs at this time. Case closed to this CSW.   Final next level of care: Home/Self Care Barriers to Discharge: Barriers Resolved   Patient Goals and CMS Choice   CMS Medicare.gov Compare Post Acute Care list provided to:: Patient Represenative (must comment)(S. Allena Katz) Choice offered to / list presented to : Adult Children  Discharge Placement                       Discharge Plan and Services                DME Arranged: Oxygen DME Agency: [facility] Date DME Agency Contacted: 01/31/20 Time DME Agency Contacted: 1124 Representative spoke with at DME Agency: [place] driver            Social Determinants of Health (SDOH) Interventions     Readmission Risk Interventions No flowsheet data found.

## 2020-01-31 NOTE — Plan of Care (Signed)
Problem: Education: Goal: Knowledge of risk factors and measures for prevention of condition will improve Outcome: Adequate for Discharge   Problem: Coping: Goal: Psychosocial and spiritual needs will be supported Outcome: Adequate for Discharge   Problem: Respiratory: Goal: Will maintain a patent airway Outcome: Adequate for Discharge Goal: Complications related to the disease process, condition or treatment will be avoided or minimized Outcome: Adequate for Discharge   Problem: Education: Goal: Knowledge of General Education information will improve Description: Including pain rating scale, medication(s)/side effects and non-pharmacologic comfort measures Outcome: Adequate for Discharge   Problem: Health Behavior/Discharge Planning: Goal: Ability to manage health-related needs will improve Outcome: Adequate for Discharge   Problem: Clinical Measurements: Goal: Ability to maintain clinical measurements within normal limits will improve Outcome: Adequate for Discharge Goal: Will remain free from infection Outcome: Adequate for Discharge Goal: Diagnostic test results will improve Outcome: Adequate for Discharge Goal: Respiratory complications will improve Outcome: Adequate for Discharge Goal: Cardiovascular complication will be avoided Outcome: Adequate for Discharge   Problem: Activity: Goal: Risk for activity intolerance will decrease Outcome: Adequate for Discharge   Problem: Nutrition: Goal: Adequate nutrition will be maintained Outcome: Adequate for Discharge   Problem: Coping: Goal: Level of anxiety will decrease Outcome: Adequate for Discharge   Problem: Elimination: Goal: Will not experience complications related to bowel motility Outcome: Adequate for Discharge Goal: Will not experience complications related to urinary retention Outcome: Adequate for Discharge   Problem: Pain Managment: Goal: General experience of comfort will improve Outcome: Adequate  for Discharge   Problem: Safety: Goal: Ability to remain free from injury will improve Outcome: Adequate for Discharge   Problem: Skin Integrity: Goal: Risk for impaired skin integrity will decrease Outcome: Adequate for Discharge   Problem: Education: Goal: Utilization of techniques to improve thought processes will improve Outcome: Adequate for Discharge Goal: Knowledge of the prescribed therapeutic regimen will improve Outcome: Adequate for Discharge   Problem: Activity: Goal: Interest or engagement in leisure activities will improve Outcome: Adequate for Discharge Goal: Imbalance in normal sleep/wake cycle will improve Outcome: Adequate for Discharge   Problem: Coping: Goal: Coping ability will improve Outcome: Adequate for Discharge Goal: Will verbalize feelings Outcome: Adequate for Discharge   Problem: Health Behavior/Discharge Planning: Goal: Ability to make decisions will improve Outcome: Adequate for Discharge Goal: Compliance with therapeutic regimen will improve Outcome: Adequate for Discharge   Problem: Role Relationship: Goal: Will demonstrate positive changes in social behaviors and relationships Outcome: Adequate for Discharge   Problem: Safety: Goal: Ability to disclose and discuss suicidal ideas will improve Outcome: Adequate for Discharge Goal: Ability to identify and utilize support systems that promote safety will improve Outcome: Adequate for Discharge   Problem: Self-Concept: Goal: Will verbalize positive feelings about self Outcome: Adequate for Discharge Goal: Level of anxiety will decrease Outcome: Adequate for Discharge   Problem: Education: Goal: Ability to describe self-care measures that may prevent or decrease complications (Diabetes Survival Skills Education) will improve Outcome: Adequate for Discharge Goal: Individualized Educational Video(s) Outcome: Adequate for Discharge   Problem: Coping: Goal: Ability to adjust to  condition or change in health will improve Outcome: Adequate for Discharge   Problem: Fluid Volume: Goal: Ability to maintain a balanced intake and output will improve Outcome: Adequate for Discharge   Problem: Health Behavior/Discharge Planning: Goal: Ability to identify and utilize available resources and services will improve Outcome: Adequate for Discharge Goal: Ability to manage health-related needs will improve Outcome: Adequate for Discharge   Problem: Metabolic: Goal: Ability to  maintain appropriate glucose levels will improve Outcome: Adequate for Discharge   Problem: Nutritional: Goal: Maintenance of adequate nutrition will improve Outcome: Adequate for Discharge Goal: Progress toward achieving an optimal weight will improve Outcome: Adequate for Discharge   Problem: Skin Integrity: Goal: Risk for impaired skin integrity will decrease Outcome: Adequate for Discharge   Problem: Tissue Perfusion: Goal: Adequacy of tissue perfusion will improve Outcome: Adequate for Discharge

## 2020-02-01 LAB — CULTURE, BLOOD (ROUTINE X 2)
Culture: NO GROWTH
Culture: NO GROWTH
Special Requests: ADEQUATE

## 2020-02-02 DIAGNOSIS — Z6821 Body mass index (BMI) 21.0-21.9, adult: Secondary | ICD-10-CM | POA: Diagnosis not present

## 2020-02-02 DIAGNOSIS — J1282 Pneumonia due to coronavirus disease 2019: Secondary | ICD-10-CM | POA: Diagnosis not present

## 2020-02-02 DIAGNOSIS — Z09 Encounter for follow-up examination after completed treatment for conditions other than malignant neoplasm: Secondary | ICD-10-CM | POA: Diagnosis not present

## 2020-02-02 DIAGNOSIS — J9610 Chronic respiratory failure, unspecified whether with hypoxia or hypercapnia: Secondary | ICD-10-CM | POA: Diagnosis not present

## 2020-02-02 DIAGNOSIS — U071 COVID-19: Secondary | ICD-10-CM | POA: Diagnosis not present

## 2020-02-09 DIAGNOSIS — R05 Cough: Secondary | ICD-10-CM | POA: Diagnosis not present

## 2020-02-09 DIAGNOSIS — H409 Unspecified glaucoma: Secondary | ICD-10-CM | POA: Diagnosis not present

## 2020-02-09 DIAGNOSIS — I2699 Other pulmonary embolism without acute cor pulmonale: Secondary | ICD-10-CM | POA: Diagnosis not present

## 2020-02-09 DIAGNOSIS — J9610 Chronic respiratory failure, unspecified whether with hypoxia or hypercapnia: Secondary | ICD-10-CM | POA: Diagnosis not present

## 2020-02-09 DIAGNOSIS — Z299 Encounter for prophylactic measures, unspecified: Secondary | ICD-10-CM | POA: Diagnosis not present

## 2020-02-09 DIAGNOSIS — Z6821 Body mass index (BMI) 21.0-21.9, adult: Secondary | ICD-10-CM | POA: Diagnosis not present

## 2020-02-10 DIAGNOSIS — H2512 Age-related nuclear cataract, left eye: Secondary | ICD-10-CM | POA: Diagnosis not present

## 2020-02-10 DIAGNOSIS — E119 Type 2 diabetes mellitus without complications: Secondary | ICD-10-CM | POA: Diagnosis not present

## 2020-02-10 DIAGNOSIS — H401133 Primary open-angle glaucoma, bilateral, severe stage: Secondary | ICD-10-CM | POA: Diagnosis not present

## 2020-02-10 DIAGNOSIS — H26101 Unspecified traumatic cataract, right eye: Secondary | ICD-10-CM | POA: Diagnosis not present

## 2020-02-15 ENCOUNTER — Emergency Department (HOSPITAL_COMMUNITY): Payer: Medicare Other

## 2020-02-15 ENCOUNTER — Other Ambulatory Visit: Payer: Self-pay

## 2020-02-15 ENCOUNTER — Encounter (HOSPITAL_COMMUNITY): Payer: Self-pay

## 2020-02-15 ENCOUNTER — Inpatient Hospital Stay (HOSPITAL_COMMUNITY)
Admission: EM | Admit: 2020-02-15 | Discharge: 2020-03-15 | DRG: 004 | Disposition: A | Discharge status: Other Acute Inpatient Hospital | Payer: Medicare Other | Attending: Pulmonary Disease | Admitting: Pulmonary Disease

## 2020-02-15 DIAGNOSIS — Y95 Nosocomial condition: Secondary | ICD-10-CM | POA: Diagnosis present

## 2020-02-15 DIAGNOSIS — R748 Abnormal levels of other serum enzymes: Secondary | ICD-10-CM | POA: Diagnosis not present

## 2020-02-15 DIAGNOSIS — A419 Sepsis, unspecified organism: Secondary | ICD-10-CM | POA: Diagnosis not present

## 2020-02-15 DIAGNOSIS — R0602 Shortness of breath: Secondary | ICD-10-CM | POA: Diagnosis not present

## 2020-02-15 DIAGNOSIS — E874 Mixed disorder of acid-base balance: Secondary | ICD-10-CM | POA: Diagnosis not present

## 2020-02-15 DIAGNOSIS — J9600 Acute respiratory failure, unspecified whether with hypoxia or hypercapnia: Secondary | ICD-10-CM | POA: Diagnosis not present

## 2020-02-15 DIAGNOSIS — R7401 Elevation of levels of liver transaminase levels: Secondary | ICD-10-CM | POA: Diagnosis not present

## 2020-02-15 DIAGNOSIS — B948 Sequelae of other specified infectious and parasitic diseases: Secondary | ICD-10-CM

## 2020-02-15 DIAGNOSIS — R1310 Dysphagia, unspecified: Secondary | ICD-10-CM

## 2020-02-15 DIAGNOSIS — E46 Unspecified protein-calorie malnutrition: Secondary | ICD-10-CM | POA: Diagnosis not present

## 2020-02-15 DIAGNOSIS — Z9981 Dependence on supplemental oxygen: Secondary | ICD-10-CM

## 2020-02-15 DIAGNOSIS — J9601 Acute respiratory failure with hypoxia: Secondary | ICD-10-CM | POA: Diagnosis not present

## 2020-02-15 DIAGNOSIS — H5461 Unqualified visual loss, right eye, normal vision left eye: Secondary | ICD-10-CM

## 2020-02-15 DIAGNOSIS — I2782 Chronic pulmonary embolism: Secondary | ICD-10-CM | POA: Diagnosis present

## 2020-02-15 DIAGNOSIS — Z7189 Other specified counseling: Secondary | ICD-10-CM

## 2020-02-15 DIAGNOSIS — J8410 Pulmonary fibrosis, unspecified: Secondary | ICD-10-CM | POA: Diagnosis present

## 2020-02-15 DIAGNOSIS — J850 Gangrene and necrosis of lung: Secondary | ICD-10-CM | POA: Diagnosis not present

## 2020-02-15 DIAGNOSIS — E43 Unspecified severe protein-calorie malnutrition: Secondary | ICD-10-CM | POA: Diagnosis present

## 2020-02-15 DIAGNOSIS — R579 Shock, unspecified: Secondary | ICD-10-CM | POA: Diagnosis not present

## 2020-02-15 DIAGNOSIS — Z794 Long term (current) use of insulin: Secondary | ICD-10-CM | POA: Diagnosis not present

## 2020-02-15 DIAGNOSIS — D509 Iron deficiency anemia, unspecified: Secondary | ICD-10-CM | POA: Diagnosis not present

## 2020-02-15 DIAGNOSIS — R918 Other nonspecific abnormal finding of lung field: Secondary | ICD-10-CM | POA: Diagnosis not present

## 2020-02-15 DIAGNOSIS — R739 Hyperglycemia, unspecified: Secondary | ICD-10-CM | POA: Diagnosis present

## 2020-02-15 DIAGNOSIS — Z86711 Personal history of pulmonary embolism: Secondary | ICD-10-CM | POA: Diagnosis not present

## 2020-02-15 DIAGNOSIS — E876 Hypokalemia: Secondary | ICD-10-CM | POA: Diagnosis not present

## 2020-02-15 DIAGNOSIS — Z4659 Encounter for fitting and adjustment of other gastrointestinal appliance and device: Secondary | ICD-10-CM

## 2020-02-15 DIAGNOSIS — Z8701 Personal history of pneumonia (recurrent): Secondary | ICD-10-CM | POA: Diagnosis not present

## 2020-02-15 DIAGNOSIS — D696 Thrombocytopenia, unspecified: Secondary | ICD-10-CM | POA: Diagnosis not present

## 2020-02-15 DIAGNOSIS — I2694 Multiple subsegmental pulmonary emboli without acute cor pulmonale: Secondary | ICD-10-CM | POA: Diagnosis not present

## 2020-02-15 DIAGNOSIS — D649 Anemia, unspecified: Secondary | ICD-10-CM | POA: Diagnosis not present

## 2020-02-15 DIAGNOSIS — E861 Hypovolemia: Secondary | ICD-10-CM | POA: Diagnosis not present

## 2020-02-15 DIAGNOSIS — Z9689 Presence of other specified functional implants: Secondary | ICD-10-CM

## 2020-02-15 DIAGNOSIS — Z978 Presence of other specified devices: Secondary | ICD-10-CM

## 2020-02-15 DIAGNOSIS — J851 Abscess of lung with pneumonia: Secondary | ICD-10-CM | POA: Diagnosis not present

## 2020-02-15 DIAGNOSIS — T4275XA Adverse effect of unspecified antiepileptic and sedative-hypnotic drugs, initial encounter: Secondary | ICD-10-CM | POA: Diagnosis not present

## 2020-02-15 DIAGNOSIS — R0600 Dyspnea, unspecified: Secondary | ICD-10-CM | POA: Diagnosis not present

## 2020-02-15 DIAGNOSIS — E875 Hyperkalemia: Secondary | ICD-10-CM | POA: Diagnosis not present

## 2020-02-15 DIAGNOSIS — R6521 Severe sepsis with septic shock: Secondary | ICD-10-CM | POA: Diagnosis not present

## 2020-02-15 DIAGNOSIS — L8990 Pressure ulcer of unspecified site, unspecified stage: Secondary | ICD-10-CM | POA: Insufficient documentation

## 2020-02-15 DIAGNOSIS — E1165 Type 2 diabetes mellitus with hyperglycemia: Secondary | ICD-10-CM | POA: Diagnosis not present

## 2020-02-15 DIAGNOSIS — Z431 Encounter for attention to gastrostomy: Secondary | ICD-10-CM | POA: Diagnosis not present

## 2020-02-15 DIAGNOSIS — D62 Acute posthemorrhagic anemia: Secondary | ICD-10-CM | POA: Diagnosis present

## 2020-02-15 DIAGNOSIS — Z8616 Personal history of COVID-19: Secondary | ICD-10-CM | POA: Diagnosis not present

## 2020-02-15 DIAGNOSIS — E870 Hyperosmolality and hypernatremia: Secondary | ICD-10-CM | POA: Diagnosis not present

## 2020-02-15 DIAGNOSIS — Z930 Tracheostomy status: Secondary | ICD-10-CM

## 2020-02-15 DIAGNOSIS — J1282 Pneumonia due to coronavirus disease 2019: Secondary | ICD-10-CM | POA: Diagnosis not present

## 2020-02-15 DIAGNOSIS — I5033 Acute on chronic diastolic (congestive) heart failure: Secondary | ICD-10-CM | POA: Diagnosis not present

## 2020-02-15 DIAGNOSIS — I9589 Other hypotension: Secondary | ICD-10-CM | POA: Diagnosis not present

## 2020-02-15 DIAGNOSIS — B49 Unspecified mycosis: Secondary | ICD-10-CM | POA: Diagnosis not present

## 2020-02-15 DIAGNOSIS — Z9911 Dependence on respirator [ventilator] status: Secondary | ICD-10-CM

## 2020-02-15 DIAGNOSIS — I2699 Other pulmonary embolism without acute cor pulmonale: Secondary | ICD-10-CM | POA: Diagnosis not present

## 2020-02-15 DIAGNOSIS — G9341 Metabolic encephalopathy: Secondary | ICD-10-CM | POA: Diagnosis not present

## 2020-02-15 DIAGNOSIS — J189 Pneumonia, unspecified organism: Secondary | ICD-10-CM

## 2020-02-15 DIAGNOSIS — H409 Unspecified glaucoma: Secondary | ICD-10-CM | POA: Diagnosis present

## 2020-02-15 DIAGNOSIS — I959 Hypotension, unspecified: Secondary | ICD-10-CM | POA: Diagnosis not present

## 2020-02-15 DIAGNOSIS — J690 Pneumonitis due to inhalation of food and vomit: Secondary | ICD-10-CM | POA: Diagnosis not present

## 2020-02-15 DIAGNOSIS — J1289 Other viral pneumonia: Secondary | ICD-10-CM | POA: Diagnosis not present

## 2020-02-15 DIAGNOSIS — E781 Pure hyperglyceridemia: Secondary | ICD-10-CM | POA: Diagnosis present

## 2020-02-15 DIAGNOSIS — J9690 Respiratory failure, unspecified, unspecified whether with hypoxia or hypercapnia: Secondary | ICD-10-CM | POA: Diagnosis not present

## 2020-02-15 DIAGNOSIS — E872 Acidosis: Secondary | ICD-10-CM | POA: Diagnosis not present

## 2020-02-15 DIAGNOSIS — Z515 Encounter for palliative care: Secondary | ICD-10-CM | POA: Diagnosis not present

## 2020-02-15 DIAGNOSIS — T502X5A Adverse effect of carbonic-anhydrase inhibitors, benzothiadiazides and other diuretics, initial encounter: Secondary | ICD-10-CM | POA: Diagnosis not present

## 2020-02-15 DIAGNOSIS — E1139 Type 2 diabetes mellitus with other diabetic ophthalmic complication: Secondary | ICD-10-CM | POA: Diagnosis not present

## 2020-02-15 DIAGNOSIS — U071 COVID-19: Secondary | ICD-10-CM | POA: Diagnosis not present

## 2020-02-15 DIAGNOSIS — I952 Hypotension due to drugs: Secondary | ICD-10-CM | POA: Diagnosis not present

## 2020-02-15 DIAGNOSIS — J984 Other disorders of lung: Secondary | ICD-10-CM | POA: Diagnosis not present

## 2020-02-15 DIAGNOSIS — R0902 Hypoxemia: Secondary | ICD-10-CM | POA: Diagnosis not present

## 2020-02-15 DIAGNOSIS — J159 Unspecified bacterial pneumonia: Secondary | ICD-10-CM | POA: Diagnosis present

## 2020-02-15 DIAGNOSIS — Z0189 Encounter for other specified special examinations: Secondary | ICD-10-CM

## 2020-02-15 DIAGNOSIS — J90 Pleural effusion, not elsewhere classified: Secondary | ICD-10-CM | POA: Diagnosis not present

## 2020-02-15 DIAGNOSIS — J17 Pneumonia in diseases classified elsewhere: Secondary | ICD-10-CM | POA: Diagnosis not present

## 2020-02-15 DIAGNOSIS — K922 Gastrointestinal hemorrhage, unspecified: Secondary | ICD-10-CM | POA: Diagnosis not present

## 2020-02-15 DIAGNOSIS — R945 Abnormal results of liver function studies: Secondary | ICD-10-CM | POA: Diagnosis not present

## 2020-02-15 DIAGNOSIS — B449 Aspergillosis, unspecified: Secondary | ICD-10-CM | POA: Diagnosis present

## 2020-02-15 DIAGNOSIS — Z6820 Body mass index (BMI) 20.0-20.9, adult: Secondary | ICD-10-CM

## 2020-02-15 DIAGNOSIS — E871 Hypo-osmolality and hyponatremia: Secondary | ICD-10-CM | POA: Diagnosis present

## 2020-02-15 DIAGNOSIS — J181 Lobar pneumonia, unspecified organism: Secondary | ICD-10-CM | POA: Diagnosis not present

## 2020-02-15 DIAGNOSIS — R5383 Other fatigue: Secondary | ICD-10-CM | POA: Diagnosis not present

## 2020-02-15 DIAGNOSIS — K921 Melena: Secondary | ICD-10-CM | POA: Diagnosis present

## 2020-02-15 DIAGNOSIS — J432 Centrilobular emphysema: Secondary | ICD-10-CM | POA: Diagnosis not present

## 2020-02-15 DIAGNOSIS — R9082 White matter disease, unspecified: Secondary | ICD-10-CM | POA: Diagnosis not present

## 2020-02-15 DIAGNOSIS — R64 Cachexia: Secondary | ICD-10-CM | POA: Diagnosis present

## 2020-02-15 DIAGNOSIS — I129 Hypertensive chronic kidney disease with stage 1 through stage 4 chronic kidney disease, or unspecified chronic kidney disease: Secondary | ICD-10-CM | POA: Diagnosis not present

## 2020-02-15 DIAGNOSIS — Z781 Physical restraint status: Secondary | ICD-10-CM

## 2020-02-15 DIAGNOSIS — I110 Hypertensive heart disease with heart failure: Secondary | ICD-10-CM | POA: Diagnosis present

## 2020-02-15 DIAGNOSIS — H547 Unspecified visual loss: Secondary | ICD-10-CM | POA: Diagnosis not present

## 2020-02-15 DIAGNOSIS — E878 Other disorders of electrolyte and fluid balance, not elsewhere classified: Secondary | ICD-10-CM | POA: Diagnosis not present

## 2020-02-15 DIAGNOSIS — J9311 Primary spontaneous pneumothorax: Secondary | ICD-10-CM | POA: Diagnosis not present

## 2020-02-15 DIAGNOSIS — E1122 Type 2 diabetes mellitus with diabetic chronic kidney disease: Secondary | ICD-10-CM | POA: Diagnosis not present

## 2020-02-15 DIAGNOSIS — J9602 Acute respiratory failure with hypercapnia: Secondary | ICD-10-CM | POA: Diagnosis not present

## 2020-02-15 DIAGNOSIS — N179 Acute kidney failure, unspecified: Secondary | ICD-10-CM | POA: Diagnosis not present

## 2020-02-15 DIAGNOSIS — N3289 Other specified disorders of bladder: Secondary | ICD-10-CM | POA: Diagnosis not present

## 2020-02-15 DIAGNOSIS — E8809 Other disorders of plasma-protein metabolism, not elsewhere classified: Secondary | ICD-10-CM | POA: Diagnosis present

## 2020-02-15 DIAGNOSIS — R339 Retention of urine, unspecified: Secondary | ICD-10-CM | POA: Diagnosis not present

## 2020-02-15 DIAGNOSIS — F419 Anxiety disorder, unspecified: Secondary | ICD-10-CM | POA: Diagnosis not present

## 2020-02-15 DIAGNOSIS — I10 Essential (primary) hypertension: Secondary | ICD-10-CM | POA: Diagnosis present

## 2020-02-15 DIAGNOSIS — G9340 Encephalopathy, unspecified: Secondary | ICD-10-CM | POA: Diagnosis not present

## 2020-02-15 DIAGNOSIS — J939 Pneumothorax, unspecified: Secondary | ICD-10-CM | POA: Diagnosis not present

## 2020-02-15 DIAGNOSIS — J80 Acute respiratory distress syndrome: Secondary | ICD-10-CM | POA: Diagnosis present

## 2020-02-15 DIAGNOSIS — R29898 Other symptoms and signs involving the musculoskeletal system: Secondary | ICD-10-CM | POA: Diagnosis not present

## 2020-02-15 DIAGNOSIS — K5900 Constipation, unspecified: Secondary | ICD-10-CM | POA: Diagnosis not present

## 2020-02-15 DIAGNOSIS — J9621 Acute and chronic respiratory failure with hypoxia: Secondary | ICD-10-CM | POA: Diagnosis not present

## 2020-02-15 DIAGNOSIS — Z4682 Encounter for fitting and adjustment of non-vascular catheter: Secondary | ICD-10-CM | POA: Diagnosis not present

## 2020-02-15 LAB — BASIC METABOLIC PANEL WITH GFR
Anion gap: 9 (ref 5–15)
BUN: 21 mg/dL (ref 8–23)
CO2: 20 mmol/L — ABNORMAL LOW (ref 22–32)
Calcium: 7.4 mg/dL — ABNORMAL LOW (ref 8.9–10.3)
Chloride: 98 mmol/L (ref 98–111)
Creatinine, Ser: 1.11 mg/dL (ref 0.61–1.24)
GFR calc Af Amer: >60 mL/min
GFR calc non Af Amer: >60 mL/min
Glucose, Bld: 216 mg/dL — ABNORMAL HIGH (ref 70–99)
Potassium: 4 mmol/L (ref 3.5–5.1)
Sodium: 127 mmol/L — ABNORMAL LOW (ref 135–145)

## 2020-02-15 LAB — PREPARE RBC (CROSSMATCH)

## 2020-02-15 LAB — CBC WITH DIFFERENTIAL/PLATELET
Abs Immature Granulocytes: 1.36 10*3/uL — ABNORMAL HIGH (ref 0.00–0.07)
Basophils Absolute: 0 10*3/uL (ref 0.0–0.1)
Basophils Relative: 0 %
Eosinophils Absolute: 0.2 10*3/uL (ref 0.0–0.5)
Eosinophils Relative: 1 %
HCT: 19 % — ABNORMAL LOW (ref 39.0–52.0)
Hemoglobin: 6 g/dL — CL (ref 13.0–17.0)
Immature Granulocytes: 5 %
Lymphocytes Relative: 4 %
Lymphs Abs: 1.2 10*3/uL (ref 0.7–4.0)
MCH: 27.5 pg (ref 26.0–34.0)
MCHC: 31.6 g/dL (ref 30.0–36.0)
MCV: 87.2 fL (ref 80.0–100.0)
Monocytes Absolute: 2.1 10*3/uL — ABNORMAL HIGH (ref 0.1–1.0)
Monocytes Relative: 8 %
Neutro Abs: 23.2 10*3/uL — ABNORMAL HIGH (ref 1.7–7.7)
Neutrophils Relative %: 82 %
Platelets: 266 10*3/uL (ref 150–400)
RBC: 2.18 MIL/uL — ABNORMAL LOW (ref 4.22–5.81)
RDW: 15.8 % — ABNORMAL HIGH (ref 11.5–15.5)
WBC: 28.1 10*3/uL — ABNORMAL HIGH (ref 4.0–10.5)
nRBC: 0 % (ref 0.0–0.2)

## 2020-02-15 LAB — PROTIME-INR
INR: 1.7 — ABNORMAL HIGH (ref 0.8–1.2)
Prothrombin Time: 20.3 seconds — ABNORMAL HIGH (ref 11.4–15.2)

## 2020-02-15 LAB — BRAIN NATRIURETIC PEPTIDE: B Natriuretic Peptide: 234.8 pg/mL — ABNORMAL HIGH (ref 0.0–100.0)

## 2020-02-15 LAB — LACTIC ACID, PLASMA: Lactic Acid, Venous: 1.8 mmol/L (ref 0.5–1.9)

## 2020-02-15 LAB — CBG MONITORING, ED: Glucose-Capillary: 135 mg/dL — ABNORMAL HIGH (ref 70–99)

## 2020-02-15 LAB — ABO/RH: ABO/RH(D): O POS

## 2020-02-15 LAB — TROPONIN I (HIGH SENSITIVITY)
Troponin I (High Sensitivity): 12 ng/L (ref <18)
Troponin I (High Sensitivity): 7 ng/L

## 2020-02-15 LAB — POC OCCULT BLOOD, ED: Fecal Occult Bld: POSITIVE — AB

## 2020-02-15 MED ORDER — SODIUM CHLORIDE 0.9 % IV SOLN
80.0000 mg | Freq: Once | INTRAVENOUS | Status: AC
  Administered 2020-02-15: 80 mg via INTRAVENOUS
  Filled 2020-02-15: qty 80

## 2020-02-15 MED ORDER — TETRACAINE HCL 0.5 % OP SOLN
2.0000 [drp] | Freq: Once | OPHTHALMIC | Status: AC
  Administered 2020-02-15: 2 [drp] via OPHTHALMIC
  Filled 2020-02-15: qty 4

## 2020-02-15 MED ORDER — SODIUM CHLORIDE 0.9 % IV SOLN
8.0000 mg/h | INTRAVENOUS | Status: DC
  Administered 2020-02-15 – 2020-02-17 (×4): 8 mg/h via INTRAVENOUS
  Filled 2020-02-15 (×7): qty 80

## 2020-02-15 MED ORDER — ONDANSETRON HCL 4 MG/2ML IJ SOLN: 4.0000 mg | Freq: Four times a day (QID) | INTRAMUSCULAR | Status: DC | PRN

## 2020-02-15 MED ORDER — ACETAMINOPHEN 650 MG RE SUPP: 650.0000 mg | Freq: Four times a day (QID) | RECTAL | Status: DC | PRN

## 2020-02-15 MED ORDER — ACETAMINOPHEN 325 MG PO TABS
650.0000 mg | ORAL_TABLET | Freq: Four times a day (QID) | ORAL | Status: DC | PRN
  Administered 2020-02-19: 650 mg via ORAL
  Filled 2020-02-15: qty 2

## 2020-02-15 MED ORDER — INSULIN ASPART 100 UNIT/ML ~~LOC~~ SOLN
0.0000 [IU] | SUBCUTANEOUS | Status: DC
  Administered 2020-02-16: 04:00:00 2 [IU] via SUBCUTANEOUS
  Administered 2020-02-16 (×2): 1 [IU] via SUBCUTANEOUS
  Administered 2020-02-16: 2 [IU] via SUBCUTANEOUS
  Administered 2020-02-16: 21:00:00 3 [IU] via SUBCUTANEOUS
  Administered 2020-02-17 (×3): 1 [IU] via SUBCUTANEOUS
  Administered 2020-02-17: 17:00:00 3 [IU] via SUBCUTANEOUS
  Administered 2020-02-18: 12:00:00 5 [IU] via SUBCUTANEOUS
  Administered 2020-02-18: 3 [IU] via SUBCUTANEOUS
  Administered 2020-02-18 (×3): 2 [IU] via SUBCUTANEOUS
  Administered 2020-02-18: 08:00:00 1 [IU] via SUBCUTANEOUS
  Administered 2020-02-19: 12:00:00 3 [IU] via SUBCUTANEOUS
  Administered 2020-02-19: 21:00:00 2 [IU] via SUBCUTANEOUS
  Administered 2020-02-19: 09:00:00 1 [IU] via SUBCUTANEOUS
  Administered 2020-02-19: 17:00:00 3 [IU] via SUBCUTANEOUS
  Administered 2020-02-19 – 2020-02-20 (×3): 2 [IU] via SUBCUTANEOUS
  Administered 2020-02-20: 21:00:00 3 [IU] via SUBCUTANEOUS
  Administered 2020-02-20 – 2020-02-21 (×4): 2 [IU] via SUBCUTANEOUS
  Administered 2020-02-21 (×2): 3 [IU] via SUBCUTANEOUS
  Administered 2020-02-21 (×2): 2 [IU] via SUBCUTANEOUS
  Administered 2020-02-21 – 2020-02-22 (×2): 3 [IU] via SUBCUTANEOUS
  Administered 2020-02-22 (×2): 2 [IU] via SUBCUTANEOUS
  Administered 2020-02-22: 18:00:00 3 [IU] via SUBCUTANEOUS
  Administered 2020-02-22: 04:00:00 1 [IU] via SUBCUTANEOUS
  Administered 2020-02-22: 20:00:00 5 [IU] via SUBCUTANEOUS
  Administered 2020-02-23: 1 [IU] via SUBCUTANEOUS
  Administered 2020-02-23: 5 [IU] via SUBCUTANEOUS
  Administered 2020-02-23: 2 [IU] via SUBCUTANEOUS
  Administered 2020-02-23 (×2): 1 [IU] via SUBCUTANEOUS
  Administered 2020-02-23: 3 [IU] via SUBCUTANEOUS
  Administered 2020-02-24: 5 [IU] via SUBCUTANEOUS
  Administered 2020-02-24: 9 [IU] via SUBCUTANEOUS

## 2020-02-15 MED ORDER — SODIUM CHLORIDE 0.9 % IV SOLN
1.0000 g | Freq: Once | INTRAVENOUS | Status: AC
  Administered 2020-02-15: 1 g via INTRAVENOUS
  Filled 2020-02-15: qty 10

## 2020-02-15 MED ORDER — ONDANSETRON HCL 4 MG PO TABS: 4.0000 mg | ORAL_TABLET | Freq: Four times a day (QID) | ORAL | Status: DC | PRN

## 2020-02-15 MED ORDER — SODIUM CHLORIDE 0.9 % IV SOLN
500.0000 mg | Freq: Once | INTRAVENOUS | Status: AC
  Administered 2020-02-16: 500 mg via INTRAVENOUS
  Filled 2020-02-15: qty 500

## 2020-02-15 MED ORDER — SODIUM CHLORIDE 0.9% IV SOLUTION: Freq: Once | INTRAVENOUS | Status: AC

## 2020-02-15 MED ORDER — CYCLOPENTOLATE HCL 1 % OP SOLN
2.0000 [drp] | Freq: Once | OPHTHALMIC | Status: AC
  Administered 2020-02-15: 2 [drp] via OPHTHALMIC
  Filled 2020-02-15: qty 2

## 2020-02-15 NOTE — ED Triage Notes (Signed)
Pt arrives from home [facility] from home. Per EMS report, the pt was D/c [facility] on the 6th, since then has had exertional shortness of breath, saturations in the 80's today. Sent home on 2L .  He is on abx. On eliquis for PE. 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 Pt had a eye doctor this week and was told that he may have a rapid vision loss in the same eye. Pt is blind in the left eye. Initially SBP 90's, CBG 450, Capnography 17-24, RR 24-26, HR 60's, saturations 80% on exertion, has been on 2L saturations in the 90's. Bilateral 18 g IV's placed en route to ED. NS given en route, last BP 118/68.    Pt is non english speaking (speaks [place])    Family can be reached at   Bruce Webb (grandson) [phone] Pt son [phone]

## 2020-02-15 NOTE — ED Provider Notes (Signed)
[facility] EMERGENCY DEPARTMENT Provider Note   CSN: [id number] Arrival date & time: 02/15/20  1631     History Chief Complaint  Patient presents with  . Loss of Vision  . Shortness of Breath    Bruce Webb is a 79 y.o. male.  Pt presents to the ED today with several complaints.  The pt was diagnosed with Covid on 1/22.  The pt was initially asymptomatic, but then became very sob.  He was admitted for hypoxia due to Covid from 2/1 to 2/6.  He was d/c home on 2L oxygen.  Pt speaks [place] and information is obtained via Nurse, learning disability and via grandson.  Pt has been very sob with ambulation.  His O2 sats drop into the 80s with any exertion.  He also has been having vision problems.  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  The right eye started getting worse while he was hospitalized.  He saw Dr. Dione Booze this week and was told the right eye has glaucoma and he will lose vision.  Pt woke up from a nap today and could not see anything out of his right eye.  Pt had been able to see colors and faces.  Now, it is black.        Past Medical History:  Diagnosis Date  . Hypertension     Patient Active Problem List   Diagnosis Date Noted  . Acute GI bleeding 02/15/2020  . Acute pulmonary embolism without acute cor pulmonale (HCC) 01/28/2020  . Pneumonia due to COVID-19 virus 01/27/2020  . Acute respiratory failure due to COVID-19 (HCC) 01/27/2020  . Essential hypertension 01/27/2020  . DM (diabetes mellitus), type 2 (HCC) 01/27/2020    History reviewed. No pertinent surgical history.     Family History  Problem Relation Age of Onset  . Diabetes Neg Hx     Social History   Tobacco Use  . Smoking status: Never Smoker  . Smokeless tobacco: Never Used  Substance Use Topics  . Alcohol use: Not on file  . Drug use: Not on file    Home Medications Prior to Admission medications   Medication Sig Start Date End Date Taking?  Authorizing Provider  acetaminophen (TYLENOL) 325 MG tablet Take 650 mg by mouth every 6 (six) hours as needed for mild pain or fever.    [provider]  apixaban (ELIQUIS) 5 MG TABS tablet Take 2 tablets (10 mg total) by mouth 2 (two) times daily for 5 days, THEN 1 tablet (5 mg total) 2 (two) times daily for 25 days. 01/31/20 03/01/20  Dimple Nanas, MD  ferrous sulfate 325 (65 FE) MG tablet Take 1 tablet (325 mg total) by mouth 2 (two) times daily with a meal. 01/31/20   Amin, Loura Halt, MD  HYDROcodone-homatropine (HYCODAN) 5-1.5 MG/5ML syrup Take 5 mLs by mouth 2 (two) times daily as needed for cough. 01/20/20   [provider]  Ipratropium-Albuterol (COMBIVENT) 20-100 MCG/ACT AERS respimat Inhale 1 puff into the lungs every 6 (six) hours as needed for wheezing. 01/31/20   Amin, Ankit Chirag, MD  lisinopril (ZESTRIL) 10 MG tablet Take 10-20 mg by mouth See admin instructions. Take 20mg  in am & 10mg  in evening 11/25/19   [provider]  metoprolol tartrate (LOPRESSOR) 50 MG tablet Take 50 mg by mouth daily.    [provider]  omeprazole (PRILOSEC) 40 MG capsule Take 40 mg by mouth daily. 11/25/19  [provider]  polyethylene glycol (MIRALAX / GLYCOLAX) 17 g packet Take 17 g by mouth daily as needed for moderate constipation or severe constipation. 01/31/20   Amin, Ankit Chirag, MD  senna-docusate (SENOKOT-S) 8.6-50 MG tablet Take 2 tablets by mouth at bedtime as needed for mild constipation or moderate constipation. 01/31/20   Dimple Nanas, MD    Allergies    Patient has no known allergies.  Review of Systems   Review of Systems  Eyes: Positive for visual disturbance.  Respiratory: Positive for shortness of breath.   All other systems reviewed and are negative.   Physical Exam Updated Vital Signs BP (!) 106/52   Pulse 66   Temp 98.2 F (36.8 C) (Oral)   Resp 19   Ht 5\' 2"  (1.575 m)   Wt 67.2 kg   SpO2 100%   BMI 27.10 kg/m    Physical Exam Vitals and nursing note reviewed. Exam conducted with a chaperone present.  Constitutional:      Appearance: He is well-developed.  HENT:     Head: Normocephalic and atraumatic.     Mouth/Throat:     Mouth: Mucous membranes are moist.     Pharynx: Oropharynx is clear.  Eyes:     Pupils:     Right eye: Pupil is sluggish.     Comments: Left eye blind Right eye with sluggish pupillary response Eye is slightly proptotic compared to the left  Right eye dilated and has a very pale retina  Pressures 21 and 23  Cardiovascular:     Rate and Rhythm: Normal rate and regular rhythm.  Pulmonary:     Effort: Tachypnea present.     Breath sounds: Examination of the right-lower field reveals rhonchi. Rhonchi present.  Abdominal:     General: Bowel sounds are normal.     Palpations: Abdomen is soft.  Genitourinary:    Rectum: Guaiac result positive.  Musculoskeletal:        General: Normal range of motion.     Cervical back: Normal range of motion and neck supple.  Skin:    General: Skin is warm.     Capillary Refill: Capillary refill takes less than 2 seconds.  Neurological:     General: No focal deficit present.     Mental Status: He is alert and oriented to person, place, and time.  Psychiatric:        Mood and Affect: Mood normal.        Behavior: Behavior normal.     ED Results / Procedures / Treatments   Labs (all labs ordered are listed, but only abnormal results are displayed) Labs Reviewed  BASIC METABOLIC PANEL - Abnormal; Notable for the following components:      Result Value   Sodium 127 (*)    CO2 20 (*)    Glucose, Bld 216 (*)    Calcium 7.4 (*)    All other components within normal limits  CBC WITH DIFFERENTIAL/PLATELET - Abnormal; Notable for the following components:   WBC 28.1 (*)    RBC 2.18 (*)    Hemoglobin 6.0 (*)    HCT 19.0 (*)    RDW 15.8 (*)    Neutro Abs 23.2 (*)    Monocytes Absolute 2.1 (*)    Abs Immature Granulocytes 1.36  (*)    All other components within normal limits  BRAIN NATRIURETIC PEPTIDE - Abnormal; Notable for the following components:   B Natriuretic Peptide 234.8 (*)    All  other components within normal limits  PROTIME-INR - Abnormal; Notable for the following components:   Prothrombin Time 20.3 (*)    INR 1.7 (*)    All other components within normal limits  POC OCCULT BLOOD, ED - Abnormal; Notable for the following components:   Fecal Occult Bld POSITIVE (*)    All other components within normal limits  CULTURE, BLOOD (ROUTINE X 2)  CULTURE, BLOOD (ROUTINE X 2)  LACTIC ACID, PLASMA  LACTIC ACID, PLASMA  TYPE AND SCREEN  PREPARE RBC (CROSSMATCH)  TROPONIN I (HIGH SENSITIVITY)  TROPONIN I (HIGH SENSITIVITY)    EKG EKG Interpretation  Date/Time:  Sunday February 15 2020 16:40:01 EST Ventricular Rate:  68 PR Interval:    QRS Duration: 103 QT Interval:  379 QTC Calculation: 403 R Axis:   17 Text Interpretation: Sinus rhythm Abnormal R-wave progression, early transition Confirmed by Isla Pence [phone]) on 02/15/2020 4:49:21 PM   Radiology CT Head Wo Contrast  Result Date: 02/15/2020 CLINICAL DATA:  79 year old male with history of right eye visual loss for the past 2 hours. EXAM: CT HEAD WITHOUT CONTRAST TECHNIQUE: Contiguous axial images were obtained from the base of the skull through the vertex without intravenous contrast. COMPARISON:  No priors. FINDINGS: Brain: Mild cerebral atrophy. Patchy and confluent areas of decreased attenuation are noted throughout the deep and periventricular white matter of the cerebral hemispheres bilaterally, compatible with chronic microvascular ischemic disease. No evidence of acute infarction, hemorrhage, hydrocephalus, extra-axial collection or mass lesion/mass effect. Vascular: No hyperdense vessel or unexpected calcification. Skull: Normal. Negative for fracture or focal lesion. Sinuses/Orbits: No acute finding. Other: None. IMPRESSION: 1. No  acute intracranial abnormalities. 2. Mild cerebral atrophy with chronic microvascular ischemic changes in the cerebral white matter, as above. Electronically Signed   By: Vinnie Langton M.D.   On: 02/15/2020 18:31   DG Chest Port 1 View  Result Date: 02/15/2020 CLINICAL DATA:  79 year old male with history of shortness of breath. Loss of vision. EXAM: PORTABLE CHEST 1 VIEW COMPARISON:  Chest x-ray 01/26/2020. FINDINGS: Patchy multifocal airspace consolidation throughout the lungs bilaterally, most confluent in the inferior aspect of the right upper lobe. Aeration has significantly worsened compared to the prior study. Probable small right pleural effusion. No pneumothorax. No evidence of pulmonary edema. Heart size is borderline enlarged. Upper mediastinal contours are within normal limits. IMPRESSION: 1. Worsening multilobar bilateral pneumonia, as above. 2. New small right pleural effusion. Electronically Signed   By: Vinnie Langton M.D.   On: 02/15/2020 17:55    Procedures Procedures (including critical care time)  Medications Ordered in ED Medications  0.9 %  sodium chloride infusion (Manually program via Guardrails IV Fluids) (has no administration in time range)  pantoprazole (PROTONIX) 80 mg in sodium chloride 0.9 % 250 mL (0.32 mg/mL) infusion (8 mg/hr Intravenous New Bag/Given 02/15/20 1839)  azithromycin (ZITHROMAX) 500 mg in sodium chloride 0.9 % 250 mL IVPB (has no administration in time range)  tetracaine (PONTOCAINE) 0.5 % ophthalmic solution 2 drop (2 drops Right Eye Given 02/15/20 1811)  cyclopentolate (CYCLODRYL,CYCLOGYL) 1 % ophthalmic solution 2 drop (2 drops Right Eye Given 02/15/20 1810)  pantoprazole (PROTONIX) 80 mg in sodium chloride 0.9 % 100 mL IVPB (0 mg Intravenous Stopped 02/15/20 1857)  cefTRIAXone (ROCEPHIN) 1 g in sodium chloride 0.9 % 100 mL IVPB (1 g Intravenous New Bag/Given 02/15/20 1901)    ED Course  I have reviewed the triage vital signs and the nursing  notes.  Pertinent labs &  imaging results that were available during my care of the patient were reviewed by me and considered in my medical decision making (see chart for details).    MDM Rules/Calculators/A&P                     After pt's hgb came back at 6, I asked him if he has seen any black stool.  He said that he has, but is unable to tell me for how long.  He is on Eliquis for a PE he developed while hospitalized for Covid.  Stool is guaiac +.  He has never seen a GI doctor.  Pt d/w Dr. Elnoria Howard (GI) who says to keep pt NPO and he will scope in the morning.  I spoke with Dr. Elmer Picker (on call for Dr. Dione Booze).  She does not think there is anything to do emergently for the vision loss.  She recommends the admission team consult Dr. Dione Booze in the morning.  Pt given IV abx for his pna.   Multiple discussions with family with pt's permission.  Pt's nephew is a cards PA and I spoke with him as well.  Pt d/w Dr. Antionette Char ([facility]) for admission.  Due to language barrier, an interpreter was present during the history-taking and subsequent discussion (and for part of the physical exam) with this patient.  CRITICAL CARE Performed by: Jacalyn Lefevre   Total critical care time: 75 minutes  Critical care time was exclusive of separately billable procedures and treating other patients.  Critical care was necessary to treat or prevent imminent or life-threatening deterioration.  Critical care was time spent personally by me on the following activities: development of treatment plan with patient and/or surrogate as well as nursing, discussions with consultants, evaluation of patient's response to treatment, examination of patient, obtaining history from patient or surrogate, ordering and performing treatments and interventions, ordering and review of laboratory studies, ordering and review of radiographic studies, pulse oximetry and re-evaluation of patient's condition.   Final Clinical Impression(s) /  ED Diagnoses Final diagnoses:  Multifocal pneumonia  Symptomatic anemia  Acute GI bleeding  Vision loss of right eye    Rx / DC Orders ED Discharge Orders    None       Jacalyn Lefevre, MD 02/15/20 1956

## 2020-02-15 NOTE — H&P (Signed)
History and Physical    Bruce Webb DGU:[id number] DOB: 1941/07/16 DOA: 02/15/2020  PCP: Ignatius Specking, MD   Patient coming from: Home   Chief Complaint: Exertional dyspnea, melena, worsening vision in right eye   HPI: Bruce Webb is a 79 y.o. 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  Patient had been diagnosed with COVID-19 on 01/16/2020, worsen despite outpatient treatment with monoclonal antibody and steroids, was admitted to the [facility] hospital, found to have pulmonary embolism while there, and was discharged on 01/31/2020 with supplemental oxygen and on Eliquis.  Patient reports that his dyspnea did not seem to improve or worsen since that discharge.  He has gone on to develop melena with some constipation and progressive vision loss involving the right eye.  He saw his ophthalmologist 5 days ago.  Since then, his vision has continued to worsen to the point where he can no longer see anything out of the right eye.  He denies any abdominal pain, indigestion, vomiting, or NSAID use.  He denies chest pain or palpitations.  He denies any headache, change in hearing, or focal numbness or weakness.  EMS was called to the patient's residence, found him to be saturating in the 80s with SBP in the 90s, and he was treated with 800 mL of saline prior to arrival in the ED.  ED Course: Upon arrival to the ED, patient is found to be afebrile, saturating 100% on 2 L/min of supplemental oxygen, and with blood pressure 105/58.  EKG features a sinus rhythm and chest x-ray notable for worsening in multifocal airspace disease since 01/26/2020.  Chemistry panel features a glucose of 216 and sodium 127.  CBC notable for an increase  leukocytosis to 28,100 and a worsened anemia with hemoglobin of 6.0, down from 8.8 on 01/31/2020.  Fecal occult blood testing was positive.  Lactic acid reassuringly normal, high-sensitivity troponin normal x2, and BNP elevated to 235.  Noncontrast head CT was negative for acute intracranial abnormality.  ED physician consulted with ophthalmology and gastroenterology, ordered blood cultures, 2 units of packed red blood cells, IV Protonix, Rocephin, and azithromycin.  Review of Systems:  All other systems reviewed and apart from HPI, are negative.  Past Medical History:  Diagnosis Date  . Hypertension     History reviewed. No pertinent surgical history.   reports that he has never smoked. He has never used smokeless tobacco. No history on file for alcohol and drug.  No Known Allergies  Family History  Problem Relation Age of Onset  . Diabetes Neg Hx      Prior to Admission medications   Medication Sig Start Date End Date Taking? Authorizing Provider  acetaminophen (TYLENOL) 325 MG tablet Take 650 mg by mouth every 6 (six) hours as needed for mild pain or fever.    [provider]  apixaban (ELIQUIS) 5 MG TABS tablet Take 2 tablets (10 mg total) by mouth 2 (two) times daily for 5 days, THEN 1 tablet (5 mg total) 2 (two) times daily for 25 days. 01/31/20 03/01/20  Dimple Nanas, MD  ferrous sulfate 325 (65 FE) MG tablet Take 1 tablet (325 mg total) by mouth 2 (two) times daily with a meal. 01/31/20   Amin, Loura Halt, MD  HYDROcodone-homatropine (  HYCODAN) 5-1.5 MG/5ML syrup Take 5 mLs by mouth 2 (two) times daily as needed for cough. 01/20/20   [provider]  Ipratropium-Albuterol (COMBIVENT) 20-100 MCG/ACT AERS respimat Inhale 1 puff into the lungs every 6 (six) hours as needed for wheezing. 01/31/20   Amin, Ankit Chirag, MD  lisinopril (ZESTRIL) 10 MG tablet Take 10-20 mg by mouth See admin instructions. Take 20mg  in am & 10mg  in evening 11/25/19   [provider]    metoprolol tartrate (LOPRESSOR) 50 MG tablet Take 50 mg by mouth daily.    [provider]  omeprazole (PRILOSEC) 40 MG capsule Take 40 mg by mouth daily. 11/25/19   [provider]  polyethylene glycol (MIRALAX / GLYCOLAX) 17 g packet Take 17 g by mouth daily as needed for moderate constipation or severe constipation. 01/31/20   Amin, Ankit Chirag, MD  senna-docusate (SENOKOT-S) 8.6-50 MG tablet Take 2 tablets by mouth at bedtime as needed for mild constipation or moderate constipation. 01/31/20   04-02-1979, MD    Physical Exam: Vitals:   02/15/20 1658 02/15/20 1658 02/15/20 1800 02/15/20 1900  BP:   (!) 105/58 (!) 106/52  Pulse:   68 66  Resp:    19  Temp:      TempSrc:      SpO2: 100%  100% 100%  Weight:  67.2 kg    Height:  5\' 2"  (1.575 m)       Constitutional: NAD, calm  Eyes: PERTLA, lids and conjunctivae normal ENMT: Mucous membranes are moist. Posterior pharynx clear of any exudate or lesions.   Neck: normal, supple, no masses, no thyromegaly Respiratory:  no wheezing, no crackles. No accessory muscle use.  Cardiovascular: S1 & S2 heard, regular rate and rhythm. No extremity edema.   Abdomen: No distension, no tenderness, soft. Bowel sounds active.  Musculoskeletal: no clubbing / cyanosis. No joint deformity upper and lower extremities.   Skin: no significant rashes, lesions, ulcers. Warm, dry, well-perfused. Neurologic: No gross facial asymmetry. Sensation intact. Strength 5/5 in all 4 limbs.  Psychiatric: Alert and oriented, appropriate throughout interview and exam. Pleasant and cooperative.    Labs and Imaging on Admission: I have personally reviewed following labs and imaging studies  CBC: Recent Labs  Lab 02/15/20 1640  WBC 28.1*  NEUTROABS 23.2*  HGB 6.0*  HCT 19.0*  MCV 87.2  PLT 266   Basic Metabolic Panel: Recent Labs  Lab 02/15/20 1640  NA 127*  K 4.0  CL 98  CO2 20*  GLUCOSE 216*  BUN 21  CREATININE 1.11  CALCIUM  7.4*   GFR: Estimated Creatinine Clearance: 46.2 mL/min (by C-G formula based on SCr of 1.11 mg/dL). Liver Function Tests: No results for input(s): AST, ALT, ALKPHOS, BILITOT, PROT, ALBUMIN in the last 168 hours. No results for input(s): LIPASE, AMYLASE in the last 168 hours. No results for input(s): AMMONIA in the last 168 hours. Coagulation Profile: Recent Labs  Lab 02/15/20 1845  INR 1.7*   Cardiac Enzymes: No results for input(s): CKTOTAL, CKMB, CKMBINDEX, TROPONINI in the last 168 hours. BNP (last 3 results) No results for input(s): PROBNP in the last 8760 hours. HbA1C: No results for input(s): HGBA1C in the last 72 hours. CBG: No results for input(s): GLUCAP in the last 168 hours. Lipid Profile: No results for input(s): CHOL, HDL, LDLCALC, TRIG, CHOLHDL, LDLDIRECT in the last 72 hours. Thyroid Function Tests: No results for input(s): TSH, T4TOTAL, FREET4, T3FREE, THYROIDAB in the last 72 hours. Anemia  Panel: No results for input(s): VITAMINB12, FOLATE, FERRITIN, TIBC, IRON, RETICCTPCT in the last 72 hours. Urine analysis: No results found for: COLORURINE, APPEARANCEUR, LABSPEC, PHURINE, GLUCOSEU, HGBUR, BILIRUBINUR, KETONESUR, PROTEINUR, UROBILINOGEN, NITRITE, LEUKOCYTESUR Sepsis Labs: @LABRCNTIP (procalcitonin:4,lacticidven:4) )No results found for this or any previous visit (from the past 240 hour(s)).   Radiological Exams on Admission: CT Head Wo Contrast  Result Date: 02/15/2020 CLINICAL DATA:  79 year old male with history of right eye visual loss for the past 2 hours. EXAM: CT HEAD WITHOUT CONTRAST TECHNIQUE: Contiguous axial images were obtained from the base of the skull through the vertex without intravenous contrast. COMPARISON:  No priors. FINDINGS: Brain: Mild cerebral atrophy. Patchy and confluent areas of decreased attenuation are noted throughout the deep and periventricular white matter of the cerebral hemispheres bilaterally, compatible with chronic  microvascular ischemic disease. No evidence of acute infarction, hemorrhage, hydrocephalus, extra-axial collection or mass lesion/mass effect. Vascular: No hyperdense vessel or unexpected calcification. Skull: Normal. Negative for fracture or focal lesion. Sinuses/Orbits: No acute finding. Other: None. IMPRESSION: 1. No acute intracranial abnormalities. 2. Mild cerebral atrophy with chronic microvascular ischemic changes in the cerebral white matter, as above. Electronically Signed   By: Vinnie Langton M.D.   On: 02/15/2020 18:31   DG Chest Port 1 View  Result Date: 02/15/2020 CLINICAL DATA:  79 year old male with history of shortness of breath. Loss of vision. EXAM: PORTABLE CHEST 1 VIEW COMPARISON:  Chest x-ray 01/26/2020. FINDINGS: Patchy multifocal airspace consolidation throughout the lungs bilaterally, most confluent in the inferior aspect of the right upper lobe. Aeration has significantly worsened compared to the prior study. Probable small right pleural effusion. No pneumothorax. No evidence of pulmonary edema. Heart size is borderline enlarged. Upper mediastinal contours are within normal limits. IMPRESSION: 1. Worsening multilobar bilateral pneumonia, as above. 2. New small right pleural effusion. Electronically Signed   By: Vinnie Langton M.D.   On: 02/15/2020 17:55    EKG: Independently reviewed. Sinus rhythm, rate 68, QTc 403 ms.   Assessment/Plan   1. GI bleeding; symptomatic anemia  - Presents with increased exertional dyspnea and reports recent constipation and melena  - Hgb is 6.0 in ED, down from 8.8 on 01/31/20; FOBT is positive in ED  - IV Protonix was started and 2 units RBC ordered from ED  - GI is consulting and much appreciated  - Hold Eliquis, keep NPO, continue IV Protonix, check post-transfusion CBC   2. 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 Multifocal pneumonia; hypoxic respiratory failure  - Patient was discharged on 01/31/20 with 2 Lpm supplemental O2 and presents with ongoing dyspnea that patient reports has not worsened or improved since discharge  - He has increased leukocytosis on admission and CXR demonstrates progression in b/l infiltrates since 01/26/20  - Blood cultures were ordered in ED and patient was treated with Rocephin and azithromycin  - Check sputum culture, procalcitonin, and strep pneumo and legionella antigens  - Continue Rocephin and azithromycin, continue supplemental O2    4. 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 Hypertension  - SBP was 90's with EMS and he was given 800 cc NS prior to arrival in ED where SBP has been in low 100's  - Hold antihypertensives initially - He has 2 units RBCs ordered and will be given additional IVF if SBP drops despite this   6. Hyperglycemia  - Serum glucose is 216 in ED  - He may have DM, but no A1c in EMR and not on medications for this at home  - Check A1c, monitor CBG's, use low-intensity SSI with Novolog as needed    7. Hyponatremia  - Serum sodium is 127 in ED, corrects to 130 when accounting for hyperglycemia  - He appears hypovolemic, was given 800 cc NS with EMS and has 2 units RBC ordered  - Repeat chem panel in am     DVT prophylaxis: Eliquis pta, held on admission. SCD's.  Code Status: Full  Family Communication: Bruce Webb, Bruce Webb was updated by phone   Disposition Plan: Pt has multiple acute problems (GIB, PNA, vision loss) that need to be addressed prior to discharge and  he may be able to return home within 72-96 hours if these can be stabilized. He had been hospitalized earlier this month as well, is at risk for deconditioning, and may need therapy assessment prior to discharge.  Consults called: GI and ophthalmology consulted by ED physician  Admission status: Inpatient. Patient has multiple acute problems including GI bleeding that will require transfusion and inpatient GI consultation. He is on Eliquis for recent PE with hypoxia which significantly complicates management. Additionally, he may have a new bacterial pneumonia and his right eye vision has worsened significantly over the past few days and ophthalmology is also consulting for this.    Briscoe Deutscher, MD [facility] Pager: See [email or web address]  If 7AM-7PM, please contact the daytime attending [email or web address]  02/15/2020, 8:43 PM

## 2020-02-15 NOTE — ED Notes (Addendum)
Date and time results received: 02/15/20 1708   Test: HGB Critical Value: 6.0  Name of Provider Notified: Dr. Particia Nearing  Orders Received

## 2020-02-15 NOTE — ED Notes (Signed)
Pt is requesting food. Pt education provided on GI bleed and NPO status.

## 2020-02-16 ENCOUNTER — Inpatient Hospital Stay (HOSPITAL_COMMUNITY): Payer: Medicare Other

## 2020-02-16 ENCOUNTER — Encounter (HOSPITAL_COMMUNITY): Payer: Self-pay | Admitting: Family Medicine

## 2020-02-16 ENCOUNTER — Encounter (HOSPITAL_COMMUNITY)
Admission: EM | Disposition: A | Discharge status: Nursing Home (Includes ICF) | Payer: Self-pay | Source: Home / Self Care | Attending: Internal Medicine

## 2020-02-16 LAB — CBC
HCT: 25.4 % — ABNORMAL LOW (ref 39.0–52.0)
Hemoglobin: 8.6 g/dL — ABNORMAL LOW (ref 13.0–17.0)
MCH: 28.1 pg (ref 26.0–34.0)
MCHC: 33.9 g/dL (ref 30.0–36.0)
MCV: 83 fL (ref 80.0–100.0)
Platelets: 248 10*3/uL (ref 150–400)
RBC: 3.06 MIL/uL — ABNORMAL LOW (ref 4.22–5.81)
RDW: 15.3 % (ref 11.5–15.5)
WBC: 24.6 10*3/uL — ABNORMAL HIGH (ref 4.0–10.5)
nRBC: 0 % (ref 0.0–0.2)

## 2020-02-16 LAB — COMPREHENSIVE METABOLIC PANEL
ALT: 120 U/L — ABNORMAL HIGH (ref 0–44)
AST: 119 U/L — ABNORMAL HIGH (ref 15–41)
Albumin: 1.5 g/dL — ABNORMAL LOW (ref 3.5–5.0)
Alkaline Phosphatase: 130 U/L — ABNORMAL HIGH (ref 38–126)
Anion gap: 10 (ref 5–15)
BUN: 16 mg/dL (ref 8–23)
CO2: 21 mmol/L — ABNORMAL LOW (ref 22–32)
Calcium: 7.6 mg/dL — ABNORMAL LOW (ref 8.9–10.3)
Chloride: 101 mmol/L (ref 98–111)
Creatinine, Ser: 0.95 mg/dL (ref 0.61–1.24)
GFR calc Af Amer: >60 mL/min (ref >60)
GFR calc non Af Amer: >60 mL/min (ref >60)
Glucose, Bld: 151 mg/dL — ABNORMAL HIGH (ref 70–99)
Potassium: 3.9 mmol/L (ref 3.5–5.1)
Sodium: 132 mmol/L — ABNORMAL LOW (ref 135–145)
Total Bilirubin: 1.3 mg/dL — ABNORMAL HIGH (ref 0.3–1.2)
Total Protein: 5 g/dL — ABNORMAL LOW (ref 6.5–8.1)

## 2020-02-16 LAB — GLUCOSE, CAPILLARY
Glucose-Capillary: 122 mg/dL — ABNORMAL HIGH (ref 70–99)
Glucose-Capillary: 140 mg/dL — ABNORMAL HIGH (ref 70–99)
Glucose-Capillary: 148 mg/dL — ABNORMAL HIGH (ref 70–99)
Glucose-Capillary: 162 mg/dL — ABNORMAL HIGH (ref 70–99)
Glucose-Capillary: 174 mg/dL — ABNORMAL HIGH (ref 70–99)

## 2020-02-16 LAB — BASIC METABOLIC PANEL
Anion gap: 10 (ref 5–15)
BUN: 16 mg/dL (ref 8–23)
CO2: 19 mmol/L — ABNORMAL LOW (ref 22–32)
Calcium: 7.4 mg/dL — ABNORMAL LOW (ref 8.9–10.3)
Chloride: 105 mmol/L (ref 98–111)
Creatinine, Ser: 0.97 mg/dL (ref 0.61–1.24)
GFR calc Af Amer: >60 mL/min (ref >60)
GFR calc non Af Amer: >60 mL/min (ref >60)
Glucose, Bld: 125 mg/dL — ABNORMAL HIGH (ref 70–99)
Potassium: 3.9 mmol/L (ref 3.5–5.1)
Sodium: 134 mmol/L — ABNORMAL LOW (ref 135–145)

## 2020-02-16 LAB — HEMOGLOBIN A1C
Hgb A1c MFr Bld: 7.1 % — ABNORMAL HIGH (ref 4.8–5.6)
Mean Plasma Glucose: 157.07 mg/dL

## 2020-02-16 LAB — PROCALCITONIN: Procalcitonin: 0.66 ng/mL

## 2020-02-16 SURGERY — EGD (ESOPHAGOGASTRODUODENOSCOPY)
Anesthesia: Monitor Anesthesia Care

## 2020-02-16 MED ORDER — SODIUM CHLORIDE 0.9 % IV SOLN
2.0000 g | Freq: Two times a day (BID) | INTRAVENOUS | Status: DC
  Administered 2020-02-16 – 2020-02-18 (×6): 2 g via INTRAVENOUS
  Filled 2020-02-16 (×10): qty 2

## 2020-02-16 MED ORDER — LATANOPROST 0.005 % OP SOLN
1.0000 [drp] | Freq: Every day | OPHTHALMIC | Status: DC
  Administered 2020-02-16 – 2020-03-14 (×28): 1 [drp] via OPHTHALMIC
  Filled 2020-02-16: qty 2.5

## 2020-02-16 MED ORDER — ADULT MULTIVITAMIN W/MINERALS CH
1.0000 | ORAL_TABLET | Freq: Every day | ORAL | Status: DC
  Administered 2020-02-16 – 2020-02-23 (×7): 1 via ORAL
  Filled 2020-02-16 (×8): qty 1

## 2020-02-16 MED ORDER — SODIUM CHLORIDE 0.9 % IV SOLN
500.0000 mg | INTRAVENOUS | Status: AC
  Administered 2020-02-17 – 2020-02-20 (×4): 500 mg via INTRAVENOUS
  Filled 2020-02-16 (×4): qty 500

## 2020-02-16 MED ORDER — SODIUM CHLORIDE 0.9% FLUSH
3.0000 mL | INTRAVENOUS | Status: DC | PRN
  Administered 2020-03-05 – 2020-03-11 (×3): 3 mL via INTRAVENOUS

## 2020-02-16 MED ORDER — SODIUM CHLORIDE 0.9 % IV SOLN
250.0000 mL | INTRAVENOUS | Status: DC | PRN
  Administered 2020-02-22 – 2020-03-07 (×3): 250 mL via INTRAVENOUS

## 2020-02-16 MED ORDER — BRIMONIDINE TARTRATE 0.2 % OP SOLN
1.0000 [drp] | Freq: Two times a day (BID) | OPHTHALMIC | Status: DC
  Administered 2020-02-16 – 2020-03-15 (×57): 1 [drp] via OPHTHALMIC
  Filled 2020-02-16: qty 5

## 2020-02-16 MED ORDER — SODIUM CHLORIDE 0.9 % IV SOLN
2.0000 g | INTRAVENOUS | Status: DC
  Filled 2020-02-16: qty 20

## 2020-02-16 MED ORDER — SODIUM CHLORIDE 0.9% IV SOLUTION: Freq: Once | INTRAVENOUS | Status: AC

## 2020-02-16 MED ORDER — BOOST / RESOURCE BREEZE PO LIQD CUSTOM
1.0000 | Freq: Three times a day (TID) | ORAL | Status: DC
  Administered 2020-02-16 (×2): 1 via ORAL

## 2020-02-16 MED ORDER — SODIUM CHLORIDE 0.9% FLUSH
3.0000 mL | Freq: Two times a day (BID) | INTRAVENOUS | Status: DC
  Administered 2020-02-16 – 2020-03-15 (×41): 3 mL via INTRAVENOUS

## 2020-02-16 MED ORDER — DORZOLAMIDE HCL 2 % OP SOLN
1.0000 [drp] | Freq: Two times a day (BID) | OPHTHALMIC | Status: DC
  Administered 2020-02-16 – 2020-03-15 (×57): 1 [drp] via OPHTHALMIC
  Filled 2020-02-16: qty 10

## 2020-02-16 MED ORDER — SODIUM CHLORIDE 0.9% FLUSH
3.0000 mL | Freq: Two times a day (BID) | INTRAVENOUS | Status: DC
  Administered 2020-02-16 – 2020-03-11 (×32): 3 mL via INTRAVENOUS

## 2020-02-16 NOTE — Consult Note (Signed)
UNASSIGNED PATIENT Reason for Consult: Guaiac positive stools with severe anemia. Referring Physician: THP.  Bruce Webb is an 79 y.o. male.  HPI: Bruce Webb is a 79 year old [place] male, who was recently diagnosed with Covid on 01/16/2020 and hospitalized from 01/26/2020 to 01/31/2020 for acute shortness of breath due to Covid pneumonia and was subsequently discharged home on 2 L of oxygen. During this time he was also diagnosed with subacute pulmonary embolism and started on Eliquis. 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 Dione Booze.  He presented to the emergency room with worsening shortness of breath and worsening of his vision loss in the right eye..  In the emergency room he was noted to have guaiac positive stools with a drop in his hemoglobin from 8.8 g/dL from when he was discharged 3 weeks ago to 6 g/dL. He denies any frank melena or hematochezia.  He denies having any abdominal pain nausea vomiting.  He is never had a colonoscopy or endoscopy.  An EGD was planned today but after discussion with Dr. Althea Charon due to his hypotension and multitude of other medical issues was postponed till tomorrow. He denies a family history of colon cancer or any other GI cancers.  He received 2 units of packed red blood cells after admission.  Due to his limited vision he is unable to tell the change in the color of his stools..   Past Medical History:  Diagnosis Date  . Hypertension    History reviewed. No pertinent surgical history.  Family History  Problem Relation Age of Onset  . Diabetes Neg Hx    Social History:  reports that he has never smoked. He has never used smokeless tobacco. No history on file for alcohol and drug.  Allergies: No Known Allergies  Medications: I have reviewed the patient's current medications.  Results for orders placed or performed during the hospital encounter of 02/15/20 (from the past 48  hour(s))  Basic metabolic panel     Status: Abnormal   Collection Time: 02/15/20  4:40 PM  Result Value Ref Range   Sodium 127 (L) 135 - 145 mmol/L   Potassium 4.0 3.5 - 5.1 mmol/L   Chloride 98 98 - 111 mmol/L   CO2 20 (L) 22 - 32 mmol/L   Glucose, Bld 216 (H) 70 - 99 mg/dL   BUN 21 8 - 23 mg/dL   Creatinine, Ser 0.71 0.61 - 1.24 mg/dL   Calcium 7.4 (L) 8.9 - 10.3 mg/dL   GFR calc non Af Amer >60 >60 mL/min   GFR calc Af Amer >60 >60 mL/min   Anion gap 9 5 - 15    Comment: Performed at [facility], [address] [place], [place] [address]  CBC with Differential     Status: Abnormal   Collection Time: 02/15/20  4:40 PM  Result Value Ref Range   WBC 28.1 (H) 4.0 - 10.5 K/uL   RBC 2.18 (L) 4.22 - 5.81 MIL/uL   Hemoglobin 6.0 (LL) 13.0 - 17.0 g/dL    Comment: REPEATED TO VERIFY THIS CRITICAL RESULT HAS VERIFIED AND BEEN CALLED TO J PAYAN,RN BY DENNIS BRADLEY ON 02 21 2021 AT 1708, AND HAS BEEN READ BACK.     HCT 19.0 (L) 39.0 - 52.0 %   MCV 87.2 80.0 - 100.0 fL   MCH 27.5 26.0 - 34.0 pg   MCHC 31.6 30.0 - 36.0 g/dL   RDW  15.8 (H) 11.5 - 15.5 %   Platelets 266 150 - 400 K/uL   nRBC 0.0 0.0 - 0.2 %   Neutrophils Relative % 82 %   Neutro Abs 23.2 (H) 1.7 - 7.7 K/uL   Lymphocytes Relative 4 %   Lymphs Abs 1.2 0.7 - 4.0 K/uL   Monocytes Relative 8 %   Monocytes Absolute 2.1 (H) 0.1 - 1.0 K/uL   Eosinophils Relative 1 %   Eosinophils Absolute 0.2 0.0 - 0.5 K/uL   Basophils Relative 0 %   Basophils Absolute 0.0 0.0 - 0.1 K/uL   RBC Morphology MORPHOLOGY UNREMARKABLE    Immature Granulocytes 5 %   Abs Immature Granulocytes 1.36 (H) 0.00 - 0.07 K/uL    Comment: Performed at [facility], [address], [place], [place] [address]  Troponin I (High Sensitivity)     Status: None   Collection Time: 02/15/20  4:40 PM  Result Value Ref Range   Troponin I (High Sensitivity) 12 <18 ng/L    Comment: (NOTE) Elevated high sensitivity troponin I (hsTnI) values and  significant  changes across serial measurements may suggest ACS but many other  chronic and acute conditions are known to elevate hsTnI results.  Refer to the "Links" section for chest pain algorithms and additional  guidance. Performed at [facility], [address], [place], [place] [address]   Brain natriuretic peptide     Status: Abnormal   Collection Time: 02/15/20  4:52 PM  Result Value Ref Range   B Natriuretic Peptide 234.8 (H) 0.0 - 100.0 pg/mL    Comment: Performed at [facility], [address], [place], [place] [address]  POC occult blood, ED Provider will collect     Status: Abnormal   Collection Time: 02/15/20  5:32 PM  Result Value Ref Range   Fecal Occult Bld POSITIVE (A) NEGATIVE  Prepare RBC     Status: None   Collection Time: 02/15/20  6:00 PM  Result Value Ref Range   Order Confirmation      ORDER PROCESSED BY BLOOD BANK Performed at [facility], [address] [place], [place] [address]   Culture, blood (routine x 2)     Status: None (Preliminary result)   Collection Time: 02/15/20  6:30 PM   Specimen: BLOOD  Result Value Ref Range   Specimen Description BLOOD SITE NOT SPECIFIED    Special Requests      BOTTLES DRAWN AEROBIC AND ANAEROBIC Blood Culture adequate volume   Culture      NO GROWTH < 12 HOURS Performed at [facility], [address] [place], [place] [address]    Report Status PENDING   Lactic acid, plasma     Status: None   Collection Time: 02/15/20  6:45 PM  Result Value Ref Range   Lactic Acid, Venous 1.8 0.5 - 1.9 mmol/L    Comment: Performed at [facility], [address], [place], [place] [address]  Culture, blood (routine x 2)     Status: None (Preliminary result)   Collection Time: 02/15/20  6:45 PM   Specimen: BLOOD  Result Value Ref Range   Specimen Description BLOOD SITE NOT SPECIFIED    Special Requests      BOTTLES DRAWN AEROBIC AND ANAEROBIC Blood Culture adequate volume   Culture       NO GROWTH < 12 HOURS Performed at [facility], [address], [place], [place]  [address]    Report Status PENDING   Protime-INR     Status: Abnormal   Collection Time: 02/15/20  6:45 PM  Result Value Ref Range   Prothrombin Time 20.3 (H) 11.4 - 15.2 seconds   INR 1.7 (H) 0.8 - 1.2    Comment: (NOTE) INR goal varies based on device and disease states. Performed at [facility], [address], [place], [place] [address]   Troponin I (High Sensitivity)     Status: None   Collection Time: 02/15/20  6:45 PM  Result Value Ref Range   Troponin I (High Sensitivity) 7 <18 ng/L    Comment: (NOTE) Elevated high sensitivity troponin I (hsTnI) values and significant  changes across serial measurements may suggest ACS but many other  chronic and acute conditions are known to elevate hsTnI results.  Refer to the "Links" section for chest pain algorithms and additional  guidance. Performed at [facility], [address], [place], [place] [address]   Type and screen [facility]     Status: None (Preliminary result)   Collection Time: 02/15/20  7:10 PM  Result Value Ref Range   ABO/RH(D) O POS    Antibody Screen NEG    Sample Expiration 02/18/2020,2359    Unit Number [id number]    Blood Component Type RED CELLS,LR    Unit division 00    Status of Unit ISSUED,FINAL    Transfusion Status OK TO TRANSFUSE    Crossmatch Result      Compatible Performed at [facility], [address], [place], [place] [address]    Unit Number [id number]    Blood Component Type RED CELLS,LR    Unit division 00    Status of Unit ISSUED    Transfusion Status OK TO TRANSFUSE    Crossmatch Result Compatible   ABO/Rh     Status: None   Collection Time: 02/15/20  7:10 PM  Result Value Ref Range   ABO/RH(D)      O POS Performed at [facility], [address] [place], [place] [address]   CBG monitoring, ED     Status: Abnormal   Collection Time:  02/15/20 11:14 PM  Result Value Ref Range   Glucose-Capillary 135 (H) 70 - 99 mg/dL  Glucose, capillary     Status: Abnormal   Collection Time: 02/16/20 12:06 AM  Result Value Ref Range   Glucose-Capillary 140 (H) 70 - 99 mg/dL   Comment 1 Notify RN    Comment 2 Document in Chart   Glucose, capillary     Status: Abnormal   Collection Time: 02/16/20  4:23 AM  Result Value Ref Range   Glucose-Capillary 162 (H) 70 - 99 mg/dL   Comment 1 Notify RN    Comment 2 Document in Chart   Hemoglobin A1c     Status: Abnormal   Collection Time: 02/16/20  5:41 AM  Result Value Ref Range   Hgb A1c MFr Bld 7.1 (H) 4.8 - 5.6 %    Comment: (NOTE) Pre diabetes:          5.7%-6.4% Diabetes:              >6.4% Glycemic control for   <7.0% adults with diabetes    Mean Plasma Glucose 157.07 mg/dL    Comment: Performed at [facility], [address] [place], [place] [address]  Procalcitonin     Status: None   Collection Time: 02/16/20  5:41 AM  Result Value Ref Range   Procalcitonin 0.66 ng/mL    Comment:        Interpretation: PCT > 0.5 ng/mL and <= 2 ng/mL: Systemic infection (sepsis) is possible, but other conditions are known to elevate PCT as well. (NOTE)       Sepsis PCT Algorithm           Lower Respiratory Tract                                      Infection PCT Algorithm    ----------------------------     ----------------------------         PCT < 0.25 ng/mL                PCT < 0.10 ng/mL         Strongly encourage             Strongly discourage   discontinuation of antibiotics    initiation of antibiotics    ----------------------------     -----------------------------       PCT 0.25 - 0.50 ng/mL            PCT 0.10 - 0.25 ng/mL               OR       >80% decrease in PCT            Discourage initiation of                                            antibiotics      Encourage discontinuation           of antibiotics    ----------------------------      -----------------------------         PCT >= 0.50 ng/mL              PCT 0.26 - 0.50 ng/mL                AND       <80% decrease in PCT             Encourage initiation of                                             antibiotics       Encourage continuation           of antibiotics    ----------------------------     -----------------------------        PCT >= 0.50 ng/mL                  PCT > 0.50 ng/mL               AND         increase in PCT                  Strongly encourage                                      initiation of antibiotics    Strongly encourage  escalation           of antibiotics                                     -----------------------------                                           PCT <= 0.25 ng/mL                                                 OR                                        > 80% decrease in PCT                                     Discontinue / Do not initiate                                             antibiotics Performed at [facility], [address] [place], [place] [address]   Comprehensive metabolic panel     Status: Abnormal   Collection Time: 02/16/20  5:41 AM  Result Value Ref Range   Sodium 132 (L) 135 - 145 mmol/L   Potassium 3.9 3.5 - 5.1 mmol/L   Chloride 101 98 - 111 mmol/L   CO2 21 (L) 22 - 32 mmol/L   Glucose, Bld 151 (H) 70 - 99 mg/dL   BUN 16 8 - 23 mg/dL   Creatinine, Ser 6.040.95 0.61 - 1.24 mg/dL   Calcium 7.6 (L) 8.9 - 10.3 mg/dL   Total Protein 5.0 (L) 6.5 - 8.1 g/dL   Albumin 1.5 (L) 3.5 - 5.0 g/dL   AST 540119 (H) 15 - 41 U/L   ALT 120 (H) 0 - 44 U/L   Alkaline Phosphatase 130 (H) 38 - 126 U/L   Total Bilirubin 1.3 (H) 0.3 - 1.2 mg/dL   GFR calc non Af Amer >60 >60 mL/min   GFR calc Af Amer >60 >60 mL/min   Anion gap 10 5 - 15    Comment: Performed at [facility], [address] [place], [place] [address]  Glucose, capillary     Status: Abnormal   Collection Time: 02/16/20  7:45 AM  Result Value  Ref Range   Glucose-Capillary 148 (H) 70 - 99 mg/dL  CBC     Status: Abnormal   Collection Time: 02/16/20 10:19 AM  Result Value Ref Range   WBC 24.6 (H) 4.0 - 10.5 K/uL   RBC 3.06 (L) 4.22 - 5.81 MIL/uL   Hemoglobin 8.6 (L) 13.0 - 17.0 g/dL    Comment: REPEATED TO VERIFY POST TRANSFUSION SPECIMEN    HCT 25.4 (L) 39.0 - 52.0 %   MCV 83.0 80.0 - 100.0 fL   MCH 28.1 26.0 - 34.0 pg  MCHC 33.9 30.0 - 36.0 g/dL   RDW 96.215.3 95.211.5 - 84.115.5 %   Platelets 248 150 - 400 K/uL   nRBC 0.0 0.0 - 0.2 %    Comment: Performed at [facility], [address] [place], [place] [address]  Glucose, capillary     Status: Abnormal   Collection Time: 02/16/20 11:22 AM  Result Value Ref Range   Glucose-Capillary 122 (H) 70 - 99 mg/dL  Basic metabolic panel     Status: Abnormal   Collection Time: 02/16/20 11:47 AM  Result Value Ref Range   Sodium 134 (L) 135 - 145 mmol/L   Potassium 3.9 3.5 - 5.1 mmol/L   Chloride 105 98 - 111 mmol/L   CO2 19 (L) 22 - 32 mmol/L   Glucose, Bld 125 (H) 70 - 99 mg/dL   BUN 16 8 - 23 mg/dL   Creatinine, Ser 1.020.97 0.61 - 1.24 mg/dL   Calcium 7.4 (L) 8.9 - 10.3 mg/dL   GFR calc non Af Amer >60 >60 mL/min   GFR calc Af Amer >60 >60 mL/min   Anion gap 10 5 - 15    Comment: Performed at [facility], [address] [place], [place] [address]    DG Chest 2 View  Result Date: 02/16/2020 CLINICAL DATA:  Hypoxia. EXAM: CHEST - 2 VIEW COMPARISON:  02/15/2020 FINDINGS: Asymmetric airspace disease, right greater than left, is similar given the differential positioning. Probable small right pleural effusion. Cardiopericardial silhouette is at upper limits of normal for size. The visualized bony structures of the thorax are intact. Telemetry leads overlie the chest. IMPRESSION: Similar appearance of asymmetric airspace disease, right greater than left. Electronically Signed   By: Kennith CenterEric  Mansell M.D.   On: 02/16/2020 09:59   CT Head Wo Contrast  Result Date:  02/15/2020 CLINICAL DATA:  79 year old male with history of right eye visual loss for the past 2 hours. EXAM: CT HEAD WITHOUT CONTRAST TECHNIQUE: Contiguous axial images were obtained from the base of the skull through the vertex without intravenous contrast. COMPARISON:  No priors. FINDINGS: Brain: Mild cerebral atrophy. Patchy and confluent areas of decreased attenuation are noted throughout the deep and periventricular white matter of the cerebral hemispheres bilaterally, compatible with chronic microvascular ischemic disease. No evidence of acute infarction, hemorrhage, hydrocephalus, extra-axial collection or mass lesion/mass effect. Vascular: No hyperdense vessel or unexpected calcification. Skull: Normal. Negative for fracture or focal lesion. Sinuses/Orbits: No acute finding. Other: None. IMPRESSION: 1. No acute intracranial abnormalities. 2. Mild cerebral atrophy with chronic microvascular ischemic changes in the cerebral white matter, as above. Electronically Signed   By: Trudie Reedaniel  Entrikin M.D.   On: 02/15/2020 18:31   DG Chest Port 1 View  Result Date: 02/15/2020 CLINICAL DATA:  79 year old male with history of shortness of breath. Loss of vision. EXAM: PORTABLE CHEST 1 VIEW COMPARISON:  Chest x-ray 01/26/2020. FINDINGS: Patchy multifocal airspace consolidation throughout the lungs bilaterally, most confluent in the inferior aspect of the right upper lobe. Aeration has significantly worsened compared to the prior study. Probable small right pleural effusion. No pneumothorax. No evidence of pulmonary edema. Heart size is borderline enlarged. Upper mediastinal contours are within normal limits. IMPRESSION: 1. Worsening multilobar bilateral pneumonia, as above. 2. New small right pleural effusion. Electronically Signed   By: Trudie Reedaniel  Entrikin M.D.   On: 02/15/2020 17:55   Review of Systems  Constitutional: Positive for activity change, diaphoresis and fatigue. Negative for appetite change, chills, fever  and unexpected  weight change.  HENT: Negative.   Eyes: Negative.   Respiratory: Positive for shortness of breath. Negative for apnea, cough, choking, chest tightness, wheezing and stridor.   Cardiovascular: Negative.   Gastrointestinal: Positive for blood in stool. Negative for abdominal distention, abdominal pain, anal bleeding, constipation, diarrhea and nausea.  Endocrine: Negative.   Genitourinary: Negative.   Musculoskeletal: Negative.   Allergic/Immunologic: Negative.   Neurological: Negative.   Hematological: Negative.   Psychiatric/Behavioral: Negative.    Blood pressure (!) 102/55, pulse 79, temperature 98.7 F (37.1 C), temperature source Oral, resp. rate 20, height 5\' 2"  (1.575 m), weight 52 kg, SpO2 94 %. Physical Exam  Constitutional: He is oriented to person, place, and time. He appears toxic. He appears distressed.  HENT:  Head: Normocephalic and atraumatic.  Eyes: Pupils are equal, round, and reactive to light.  Cardiovascular: Normal rate and regular rhythm.  Respiratory: Tachypnea noted. He is in respiratory distress. He has rhonchi in the right middle field, the right lower field, the left middle field and the left lower field.  GI: Soft. Bowel sounds are normal.  Musculoskeletal:     Cervical back: Normal range of motion and neck supple.  Neurological: He is alert and oriented to person, place, and time.  Skin: Skin is warm and dry.  Psychiatric: His behavior is normal. Judgment and thought content normal.   Assessment/Plan: 1) Severe anemia with guaiac positive stools-due to the patient's fragile respiratory status EGD will be done tomorrow.  I am not sure the patient can tolerate a colonoscopy prep at this time. Further recommendation made after EGD has been done. 2) Acute on chronic hypoxic respiratory failure recent Covid pneumonia with severe hypoxia on on Cefepime pain and Azithromycin. 3) recent pulmonary embolism CTA of the chest demonstrated small  subacute/chronic PE Eliquis on hold because of his GI symptoms at this time. 4) Hypertension/AODM. 5) Severe malnutrition with albumin of 1.5   Juanita Craver 02/16/2020, 2:11 PM

## 2020-02-16 NOTE — Plan of Care (Signed)
New admission

## 2020-02-16 NOTE — Progress Notes (Signed)
Pharmacy Antibiotic Note  Bruce Webb is a 79 y.o. male admitted on 02/15/2020 with dyspnea. Pharmacy has been consulted for cefepime dosing with concern for bacterial PNA. Pt had recent COVID-19 PNA hospitalization.  Plan: -Cefepime 2g IV q12h   Height: 5\' 2"  (157.5 cm) Weight: 114 lb 10.2 oz (52 kg) IBW/kg (Calculated) : 54.6  Temp (24hrs), Avg:98.7 F (37.1 C), Min:98.1 F (36.7 C), Max:99.9 F (37.7 C)  Recent Labs  Lab 02/15/20 1640 02/15/20 1845 02/16/20 0541 02/16/20 1019 02/16/20 1147  WBC 28.1*  --   --  24.6*  --   CREATININE 1.11  --  0.95  --  0.97  LATICACIDVEN  --  1.8  --   --   --     Estimated Creatinine Clearance: 46.2 mL/min (by C-G formula based on SCr of 0.97 mg/dL).    No Known Allergies  Antimicrobials this admission: Ceftriaxone x1 2/21 >>  Azithromycin 2/22 >>  Cefepime 2/22 >>   Microbiology results: 2/21 BCx: NGTD  Thank you for allowing pharmacy to be a part of this patient's care.  3/21, PharmD, BCPS Clinical Pharmacist [phone] Please check AMION for all [facility] Pharmacy numbers 02/16/2020

## 2020-02-16 NOTE — Progress Notes (Signed)
PROGRESS NOTE    Bruce Webb  AOZ:[id number] DOB: 01-14-1941 DOA: 02/15/2020 PCP: Ignatius Specking, MD  Brief Narrative: Bruce Webb is a 79 year old male with history of diabetes mellitus type 2, hypertension, left eye vision loss was hospitalized with severe respiratory failure from Covid month ago, diagnosed with Covid on 1/22, hospitalized on 2/1 and discharged home 2/6 on 2 L home O2.  He was also diagnosed with subacute pulmonary embolism and started on Eliquis then -Over the past month he has had deterioration of vision in his right eye as well, saw Dr. Charlton Haws with ophthalmology on 2/16 and was diagnosed with end-stage glaucoma of both eyes, he was told that he would lose vision in his right eye as well unfortunately, also started on maximal therapy for glaucoma with multiple eyedrops, his IOP was normal at the time. -Presented to the emergency room with worsening dyspnea yesterday associated with worsening vision loss in his right eye.  -In the emergency room he was noted to have a hemoglobin of 6.0 with heme positive stools down from 8.8 at discharge 3 weeks ago, also required a nonrebreather mask initially now down to 2 to 3 L of FiO2, chest x-ray showed worsening bilateral infiltrates and WBC of 28K  Assessment & Plan:  Melena, Acute blood loss anemia -Concern for upper GI bleed, Eliquis held -Transfused 2 units of PRBC overnight -Repeat hemoglobin 7.1 this morning, will repeat -Gastroenterology Dr. Elnoria Howard consulted, possible EGD today  Acute on chronic hypoxic respiratory failure -Multifocal pneumonia -Recent Covid pneumonia with severe hypoxia -Chest x-ray with worsening infiltrates, worsening leukocytosis as well -History not suggestive of aspiration, suspect secondary bacterial infection given elevated procalcitonin level as well -Was started on Rocephin, azithromycin, stop Rocephin and start cefepime will continue azithromycin today -Follow-up blood cultures  End-stage  glaucoma both eyes -Lost vision in his left eye 8 years ago -Worsening vision loss in his right eye for the past month -Saw Dr. Marchelle Gearing for the first time on 2/16, on exam was noted to have end-stage glaucoma of both eyes, and was told that he will lose vision in his right eye as well -I called and discussed with Dr. 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 for endoscopy, will consult RD and add supplements as tolerated  Hypocalcemia -Corrected calcium for albumin is normal  DVT prophylaxis: SCDs Code Status: Full code Family Communication: No family at bedside, updated nephew will call grandson Disposition Plan: Home likely in 2 to 3 days pending stabilization of above  Consultants:   Gastroenterology Dr. Elnoria Howard  Discussed with ophthalmology Dr. Marchelle Gearing   Procedures:   Antimicrobials:    Subjective: -Complains of dyspnea with exertion, cough -Right eye decreased vision for 1 month, worsening -Patient seen with the assistance of a [place] interpreter on the iPad  Objective: Vitals:   02/16/20 0500 02/16/20 0526 02/16/20 0600 02/16/20 0746  BP: (!) 84/65 122/66  (!) 98/52 (!) 102/55  Pulse: 69 89 79   Resp: (!) 22 20 20    Temp:    98.1 F (36.7 C)  TempSrc:    Oral  SpO2: 96%  94%   Weight:      Height:        Intake/Output Summary (Last 24 hours) at 02/16/2020 1004 Last data filed at 02/16/2020 0844 Gross per 24 hour  Intake 1409.57 ml  Output 300 ml  Net 1109.57 ml   Filed Weights   02/15/20 1658 02/16/20 0001  Weight: 67.2 kg 52 kg    Examination:  General exam: Chronically ill appearing male, awake alert oriented x3, no distress Respiratory system: Coarse crackles midway down both lungs Cardiovascular system: S1-S2, regular rate and rhythm  gastrointestinal system: Abdomen is nondistended, soft and nontender.Normal bowel sounds heard. Central nervous system: Awake alert, appropriate, moves all extremities, no localizing signs Extremities: Trace edema Skin: No rashes on exposed skin Psychiatry:  Mood & affect appropriate.     Data Reviewed:   CBC: Recent Labs  Lab 02/15/20 1640  WBC 28.1*  NEUTROABS 23.2*  HGB 6.0*  HCT 19.0*  MCV 87.2  PLT 671   Basic Metabolic Panel: Recent Labs  Lab 02/15/20 1640 02/16/20 0541  NA 127* 132*  K 4.0 3.9  CL 98 101  CO2 20* 21*  GLUCOSE 216* 151*  BUN 21 16  CREATININE 1.11 0.95  CALCIUM 7.4* 7.6*   GFR: Estimated Creatinine Clearance: 47.1 mL/min (by C-G formula based on SCr of 0.95 mg/dL). Liver Function Tests: Recent Labs  Lab 02/16/20 0541  AST 119*  ALT 120*  ALKPHOS 130*  BILITOT 1.3*  PROT 5.0*  ALBUMIN 1.5*   No results for input(s): LIPASE, AMYLASE in the last 168 hours. No results for input(s): AMMONIA in the last 168 hours. Coagulation Profile: Recent Labs  Lab 02/15/20 1845  INR 1.7*   Cardiac Enzymes: No results for input(s): CKTOTAL, CKMB, CKMBINDEX, TROPONINI in the last 168 hours. BNP (last 3 results) No results for input(s): PROBNP in the last 8760 hours. HbA1C: Recent Labs    02/16/20 0541  HGBA1C 7.1*   CBG: Recent Labs  Lab  02/15/20 2314 02/16/20 0006 02/16/20 0423 02/16/20 0745  GLUCAP 135* 140* 162* 148*   Lipid Profile: No results for input(s): CHOL, HDL, LDLCALC, TRIG, CHOLHDL, LDLDIRECT in the last 72 hours. Thyroid Function Tests: No results for input(s): TSH, T4TOTAL, FREET4, T3FREE, THYROIDAB in the last 72 hours. Anemia Panel: No results for input(s): VITAMINB12, FOLATE, FERRITIN, TIBC, IRON, RETICCTPCT in the last 72 hours. Urine analysis: No results found for: COLORURINE, APPEARANCEUR, LABSPEC, PHURINE, GLUCOSEU, HGBUR, BILIRUBINUR, KETONESUR, PROTEINUR, UROBILINOGEN, NITRITE, LEUKOCYTESUR Sepsis Labs: @LABRCNTIP (procalcitonin:4,lacticidven:4)  ) Recent Results (from the past 240 hour(s))  Culture, blood (routine x 2)     Status: None (Preliminary result)   Collection Time: 02/15/20  6:30 PM   Specimen: BLOOD  Result Value Ref Range Status   Specimen Description BLOOD SITE NOT SPECIFIED  Final   Special Requests   Final    BOTTLES DRAWN AEROBIC AND ANAEROBIC Blood Culture adequate volume   Culture   Final    NO GROWTH < 12 HOURS Performed at [facility], [address] [place], [place] [address]    Report Status PENDING  Incomplete  Culture, blood (routine x 2)     Status: None (Preliminary  result)   Collection Time: 02/15/20  6:45 PM   Specimen: BLOOD  Result Value Ref Range Status   Specimen Description BLOOD SITE NOT SPECIFIED  Final   Special Requests   Final    BOTTLES DRAWN AEROBIC AND ANAEROBIC Blood Culture adequate volume   Culture   Final    NO GROWTH < 12 HOURS Performed at [facility], [address], [place], [place] [address]    Report Status PENDING  Incomplete         Radiology Studies: DG Chest 2 View  Result Date: 02/16/2020 CLINICAL DATA:  Hypoxia. EXAM: CHEST - 2 VIEW COMPARISON:  02/15/2020 FINDINGS: Asymmetric airspace disease, right greater than left, is similar given the differential positioning. Probable small right pleural effusion.  Cardiopericardial silhouette is at upper limits of normal for size. The visualized bony structures of the thorax are intact. Telemetry leads overlie the chest. IMPRESSION: Similar appearance of asymmetric airspace disease, right greater than left. Electronically Signed   By: Kennith Center M.D.   On: 02/16/2020 09:59   CT Head Wo Contrast  Result Date: 02/15/2020 CLINICAL DATA:  79 year old male with history of right eye visual loss for the past 2 hours. EXAM: CT HEAD WITHOUT CONTRAST TECHNIQUE: Contiguous axial images were obtained from the base of the skull through the vertex without intravenous contrast. COMPARISON:  No priors. FINDINGS: Brain: Mild cerebral atrophy. Patchy and confluent areas of decreased attenuation are noted throughout the deep and periventricular white matter of the cerebral hemispheres bilaterally, compatible with chronic microvascular ischemic disease. No evidence of acute infarction, hemorrhage, hydrocephalus, extra-axial collection or mass lesion/mass effect. Vascular: No hyperdense vessel or unexpected calcification. Skull: Normal. Negative for fracture or focal lesion. Sinuses/Orbits: No acute finding. Other: None. IMPRESSION: 1. No acute intracranial abnormalities. 2. Mild cerebral atrophy with chronic microvascular ischemic changes in the cerebral white matter, as above. Electronically Signed   By: Trudie Reed M.D.   On: 02/15/2020 18:31   DG Chest Port 1 View  Result Date: 02/15/2020 CLINICAL DATA:  79 year old male with history of shortness of breath. Loss of vision. EXAM: PORTABLE CHEST 1 VIEW COMPARISON:  Chest x-ray 01/26/2020. FINDINGS: Patchy multifocal airspace consolidation throughout the lungs bilaterally, most confluent in the inferior aspect of the right upper lobe. Aeration has significantly worsened compared to the prior study. Probable small right pleural effusion. No pneumothorax. No evidence of pulmonary edema. Heart size is borderline enlarged. Upper  mediastinal contours are within normal limits. IMPRESSION: 1. Worsening multilobar bilateral pneumonia, as above. 2. New small right pleural effusion. Electronically Signed   By: Trudie Reed M.D.   On: 02/15/2020 17:55        Scheduled Meds: . insulin aspart  0-9 Units Subcutaneous Q4H  . sodium chloride flush  3 mL Intravenous Q12H  . sodium chloride flush  3 mL Intravenous Q12H   Continuous Infusions: . sodium chloride    . [START ON 02/17/2020] azithromycin    . cefTRIAXone (ROCEPHIN)  IV    . pantoprozole (PROTONIX) infusion 8 mg/hr (02/16/20 0357)     LOS: 1 day    Time spent:  Zannie Cove, MD [facility]  02/16/2020, 10:04 AM

## 2020-02-16 NOTE — Plan of Care (Signed)

## 2020-02-16 NOTE — Progress Notes (Signed)
Initial Nutrition Assessment  DOCUMENTATION CODES:   Not applicable  INTERVENTION:   -MVI with minerals daily -Boost Breeze po TID, each supplement provides 250 kcal and 9 grams of protein -RD will follow for diet advancement and adjust supplement regimen as appropriate  NUTRITION DIAGNOSIS:   Inadequate oral intake related to altered GI function as evidenced by NPO status.  GOAL:   Patient will meet greater than or equal to 90% of their needs  MONITOR:   PO intake, Supplement acceptance, Diet advancement, Labs, Weight trends, Skin, I & O's  REASON FOR ASSESSMENT:   Consult Assessment of nutrition requirement/status  ASSESSMENT:   Bruce Webb is a 79 y.o. 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  Patient had been diagnosed with COVID-19 on 01/16/2020, worsen despite outpatient treatment with monoclonal antibody and steroids, was admitted to the [facility] hospital, found to have pulmonary embolism while there, and was discharged on 01/31/2020 with supplemental oxygen and on Eliquis.  Patient reports that his dyspnea did not seem to improve or worsen since that discharge.  He has gone on to develop melena with some constipation and progressive vision loss involving the right eye.  He saw his ophthalmologist 5 days ago.  Since then, his vision has continued to worsen to the point where he can no longer see anything out of the right eye.  He denies any abdominal pain, indigestion, vomiting, or NSAID use.  He denies chest pain or palpitations.  He denies any headache, change in hearing, or focal numbness or weakness.  EMS was called to the patient's residence, found him to be saturating in the 80s with SBP  in the 90s, and he was treated with 800 mL of saline prior to arrival in the ED.  Pt admitted with acute blood loss anemia, melena, and GIB.   Reviewed I/O's: +1.1 L x 24 hours  UOP: 300 ml x 24 hours  Case discussed with RN, who reports pt had not had any PO's and diet had just been advanced to clear liquids. Pt has not had any PO's or oral medications yet. She reports family member may be coming up in a while; pt is currently sleepy due to just coming back for procedure. Per discussion with RN, endoscopy is scheduled for tomorrow and will be NPO after midnight.   Pt sleeping soundly at time of visit. He reports feeling well today. He was drifting in and out of sleep at time of visit.   Reviewed wt hx; noted pt has experienced a 22.5% wt los over the past 3 weeks, however, RD questions accuracy of current wt. Will use previous wt of 67.2 kg for estimating needs.   Albumin has a half-life of 21 days and is strongly affected by stress response and inflammatory process, therefore, do not expect to see an improvement in this lab value during acute hospitalization. When a patient presents with low albumin, it is likely skewed due to the acute inflammatory response.  Unless it is suspected that patient had poor PO intake or malnutrition prior to admission, then RD should not be consulted solely for low albumin. Note that low albumin is no longer used to diagnose malnutrition; [place] uses the new malnutrition guidelines published by the American Society for Parenteral  and Enteral Nutrition (A.S.P.E.N.) and the Academy of Nutrition and Dietetics (AND).    Lab Results  Component Value Date   HGBA1C 7.1 (H) 02/16/2020   PTA DM medications are . Per ADA's Standards of Medical Care for Diabetes, glycemic targets for elderly patients who are otherwise healthy (few medical impairments) and cognitively intact should be less stringent (Hgb A1c <7.5).   Labs reviewed: Na: 132, CBGS: 122-148 (inpatient  orders for glycemic control are 0-9 units insulin aspart every 4 hours).   NUTRITION - FOCUSED PHYSICAL EXAM:    Most Recent Value  Orbital Region  No depletion  Upper Arm Region  No depletion  Thoracic and Lumbar Region  No depletion  Buccal Region  No depletion  Temple Region  Mild depletion  Clavicle Bone Region  No depletion  Clavicle and Acromion Bone Region  No depletion  Scapular Bone Region  No depletion  Dorsal Hand  No depletion  Patellar Region  No depletion  Anterior Thigh Region  No depletion  Posterior Calf Region  No depletion  Edema (RD Assessment)  None  Hair  Reviewed  Eyes  Reviewed  Mouth  Reviewed  Skin  Reviewed  Nails  Reviewed       Diet Order:   Diet Order            Diet clear liquid Room service appropriate? Yes; Fluid consistency: Thin  Diet effective now              EDUCATION NEEDS:   Not appropriate for education at this time  Skin:  Skin Assessment: Reviewed RN Assessment  Last BM:  02/16/20  Height:   Ht Readings from Last 1 Encounters:  02/15/20 5\' 2"  (1.575 m)    Weight:   Wt Readings from Last 1 Encounters:  02/16/20 52 kg    Ideal Body Weight:  53.6 kg  BMI:  Body mass index is 20.97 kg/m.  Estimated Nutritional Needs:   Kcal:  1650-1850  Protein:  80-95 grams  Fluid:  > 1.6 L    Loistine Chance, RD, LDN, [place] Registered Dietitian II Certified Diabetes Care and Education Specialist Please refer to [facility] for RD and/or RD on-call/weekend/after hours pager

## 2020-02-17 ENCOUNTER — Inpatient Hospital Stay (HOSPITAL_COMMUNITY): Payer: Medicare Other

## 2020-02-17 ENCOUNTER — Encounter (HOSPITAL_COMMUNITY)
Admission: EM | Disposition: A | Discharge status: Nursing Home (Includes ICF) | Payer: Self-pay | Source: Home / Self Care | Attending: Internal Medicine

## 2020-02-17 ENCOUNTER — Encounter (HOSPITAL_COMMUNITY): Payer: Self-pay | Admitting: Certified Registered"

## 2020-02-17 DIAGNOSIS — R0600 Dyspnea, unspecified: Secondary | ICD-10-CM

## 2020-02-17 LAB — BPAM RBC
Blood Product Expiration Date: 202102232359
Blood Product Expiration Date: 202102232359
ISSUE DATE / TIME: 202102212055
ISSUE DATE / TIME: 202102220033
Unit Type and Rh: 9500
Unit Type and Rh: 9500

## 2020-02-17 LAB — BASIC METABOLIC PANEL
Anion gap: 11 (ref 5–15)
BUN: 13 mg/dL (ref 8–23)
CO2: 23 mmol/L (ref 22–32)
Calcium: 7.6 mg/dL — ABNORMAL LOW (ref 8.9–10.3)
Chloride: 97 mmol/L — ABNORMAL LOW (ref 98–111)
Creatinine, Ser: 1.04 mg/dL (ref 0.61–1.24)
GFR calc Af Amer: >60 mL/min (ref >60)
GFR calc non Af Amer: >60 mL/min (ref >60)
Glucose, Bld: 256 mg/dL — ABNORMAL HIGH (ref 70–99)
Potassium: 3.3 mmol/L — ABNORMAL LOW (ref 3.5–5.1)
Sodium: 131 mmol/L — ABNORMAL LOW (ref 135–145)

## 2020-02-17 LAB — GLUCOSE, CAPILLARY
Glucose-Capillary: 111 mg/dL — ABNORMAL HIGH (ref 70–99)
Glucose-Capillary: 203 mg/dL — ABNORMAL HIGH (ref 70–99)
Glucose-Capillary: 130 mg/dL — ABNORMAL HIGH (ref 70–99)
Glucose-Capillary: 139 mg/dL — ABNORMAL HIGH (ref 70–99)
Glucose-Capillary: 131 mg/dL — ABNORMAL HIGH (ref 70–99)
Glucose-Capillary: 217 mg/dL — ABNORMAL HIGH (ref 70–99)
Glucose-Capillary: 173 mg/dL — ABNORMAL HIGH (ref 70–99)

## 2020-02-17 LAB — COMPREHENSIVE METABOLIC PANEL
ALT: 158 U/L — ABNORMAL HIGH (ref 0–44)
AST: 186 U/L — ABNORMAL HIGH (ref 15–41)
Albumin: 1.3 g/dL — ABNORMAL LOW (ref 3.5–5.0)
Alkaline Phosphatase: 161 U/L — ABNORMAL HIGH (ref 38–126)
Anion gap: 11 (ref 5–15)
BUN: 14 mg/dL (ref 8–23)
CO2: 20 mmol/L — ABNORMAL LOW (ref 22–32)
Calcium: 7.3 mg/dL — ABNORMAL LOW (ref 8.9–10.3)
Chloride: 102 mmol/L (ref 98–111)
Creatinine, Ser: 0.91 mg/dL (ref 0.61–1.24)
GFR calc Af Amer: >60 mL/min (ref >60)
GFR calc non Af Amer: >60 mL/min (ref >60)
Glucose, Bld: 141 mg/dL — ABNORMAL HIGH (ref 70–99)
Potassium: 3.6 mmol/L (ref 3.5–5.1)
Sodium: 133 mmol/L — ABNORMAL LOW (ref 135–145)
Total Bilirubin: 1.1 mg/dL (ref 0.3–1.2)
Total Protein: 4.6 g/dL — ABNORMAL LOW (ref 6.5–8.1)

## 2020-02-17 LAB — PROCALCITONIN: Procalcitonin: 0.5 ng/mL

## 2020-02-17 LAB — CBC
HCT: 25.8 % — ABNORMAL LOW (ref 39.0–52.0)
Hemoglobin: 8.5 g/dL — ABNORMAL LOW (ref 13.0–17.0)
MCH: 27.7 pg (ref 26.0–34.0)
MCHC: 32.9 g/dL (ref 30.0–36.0)
MCV: 84 fL (ref 80.0–100.0)
Platelets: 214 10*3/uL (ref 150–400)
RBC: 3.07 MIL/uL — ABNORMAL LOW (ref 4.22–5.81)
RDW: 15.8 % — ABNORMAL HIGH (ref 11.5–15.5)
WBC: 24.9 10*3/uL — ABNORMAL HIGH (ref 4.0–10.5)
nRBC: 0 % (ref 0.0–0.2)

## 2020-02-17 LAB — TYPE AND SCREEN
ABO/RH(D): O POS
Antibody Screen: NEGATIVE
Unit division: 0
Unit division: 0

## 2020-02-17 LAB — ECHOCARDIOGRAM COMPLETE
Height: 62 in
Weight: 1855.39 oz

## 2020-02-17 SURGERY — ESOPHAGOGASTRODUODENOSCOPY (EGD) WITH PROPOFOL
Anesthesia: Monitor Anesthesia Care

## 2020-02-17 MED ORDER — POTASSIUM CHLORIDE CRYS ER 20 MEQ PO TBCR
40.0000 meq | EXTENDED_RELEASE_TABLET | Freq: Every day | ORAL | Status: DC
  Administered 2020-02-17 – 2020-02-21 (×5): 40 meq via ORAL
  Filled 2020-02-17 (×6): qty 2

## 2020-02-17 MED ORDER — SODIUM CHLORIDE 0.9 % IV SOLN: INTRAVENOUS | Status: DC

## 2020-02-17 MED ORDER — MIDODRINE HCL 5 MG PO TABS
10.0000 mg | ORAL_TABLET | Freq: Two times a day (BID) | ORAL | Status: DC
  Administered 2020-02-17 – 2020-02-23 (×10): 10 mg via ORAL
  Filled 2020-02-17 (×11): qty 2

## 2020-02-17 MED ORDER — FUROSEMIDE 20 MG PO TABS
20.0000 mg | ORAL_TABLET | Freq: Every day | ORAL | Status: AC
  Administered 2020-02-17: 15:00:00 20 mg via ORAL
  Filled 2020-02-17: qty 1

## 2020-02-17 MED ORDER — FUROSEMIDE 10 MG/ML IJ SOLN
20.0000 mg | Freq: Two times a day (BID) | INTRAMUSCULAR | Status: DC
  Administered 2020-02-18 – 2020-02-19 (×3): 20 mg via INTRAVENOUS
  Filled 2020-02-17 (×3): qty 2

## 2020-02-17 MED ORDER — ENSURE ENLIVE PO LIQD
237.0000 mL | Freq: Three times a day (TID) | ORAL | Status: DC
  Administered 2020-02-17 – 2020-02-21 (×11): 237 mL via ORAL

## 2020-02-17 MED ORDER — PRO-STAT SUGAR FREE PO LIQD
30.0000 mL | Freq: Two times a day (BID) | ORAL | Status: DC
  Administered 2020-02-17 – 2020-02-21 (×8): 30 mL via ORAL
  Filled 2020-02-17 (×9): qty 30

## 2020-02-17 MED ORDER — GUAIFENESIN ER 600 MG PO TB12
600.0000 mg | ORAL_TABLET | Freq: Two times a day (BID) | ORAL | Status: DC
  Administered 2020-02-17 – 2020-02-21 (×10): 600 mg via ORAL
  Filled 2020-02-17 (×12): qty 1

## 2020-02-17 MED ORDER — FUROSEMIDE 10 MG/ML IJ SOLN
20.0000 mg | Freq: Once | INTRAMUSCULAR | Status: AC
  Administered 2020-02-17: 20:00:00 20 mg via INTRAVENOUS
  Filled 2020-02-17: qty 2

## 2020-02-17 MED ORDER — FUROSEMIDE 10 MG/ML IJ SOLN
40.0000 mg | Freq: Once | INTRAMUSCULAR | Status: AC
  Administered 2020-02-17: 40 mg via INTRAVENOUS
  Filled 2020-02-17: qty 4

## 2020-02-17 MED ORDER — PANTOPRAZOLE SODIUM 40 MG IV SOLR
40.0000 mg | Freq: Two times a day (BID) | INTRAVENOUS | Status: DC
  Administered 2020-02-17 – 2020-02-26 (×18): 40 mg via INTRAVENOUS
  Filled 2020-02-17 (×20): qty 40

## 2020-02-17 MED ORDER — HEPARIN (PORCINE) 25000 UT/250ML-% IV SOLN
1100.0000 [IU]/h | INTRAVENOUS | Status: AC
  Administered 2020-02-17: 650 [IU]/h via INTRAVENOUS
  Administered 2020-02-18 – 2020-02-19 (×2): 1000 [IU]/h via INTRAVENOUS
  Filled 2020-02-17 (×3): qty 250

## 2020-02-17 NOTE — Progress Notes (Addendum)
ANTICOAGULATION CONSULT NOTE - Initial Consult  Pharmacy Consult for heparin Indication: pulmonary embolus  No Known Allergies  Patient Measurements: Height: 5\' 2"  (157.5 cm) Weight: 115 lb 15.4 oz (52.6 kg) IBW/kg (Calculated) : 54.6 Heparin Dosing Weight: 52kg  Vital Signs: Temp: 97.4 F (36.3 C) (02/23 1153) Temp Source: Oral (02/23 1153) BP: 100/81 (02/23 1202) Pulse Rate: 90 (02/23 1202)  Labs: Recent Labs    02/15/20 1640 02/15/20 1640 02/15/20 1845 02/16/20 0541 02/16/20 1019 02/16/20 1147 02/17/20 0715  HGB 6.0*   < >  --   --  8.6*  --  8.5*  HCT 19.0*  --   --   --  25.4*  --  25.8*  PLT 266  --   --   --  248  --  214  LABPROT  --   --  20.3*  --   --   --   --   INR  --   --  1.7*  --   --   --   --   CREATININE 1.11   < >  --  0.95  --  0.97 0.91  TROPONINIHS 12  --  7  --   --   --   --    < > = values in this interval not displayed.    Estimated Creatinine Clearance: 49.8 mL/min (by C-G formula based on SCr of 0.91 mg/dL).   Medical History: Past Medical History:  Diagnosis Date  . Hypertension      Assessment: 81 yoM on apixaban at home due to recent subacute PE in February. Apixaban held on admit with GIB, pharmacy now asked to start IV heparin. Last dose of apixaban was 2/21.  Goal of Therapy:  Heparin level 0.3-0.5 units/ml aPTT 66-85 seconds Monitor platelets by anticoagulation protocol: Yes   Plan:  -Begin heparin no bolus at 650 units/h -Check aPTT and heparin level in 8hr - target lower end with recent bleeding   3/21, PharmD, BCPS Clinical Pharmacist [phone] Please check AMION for all [facility] numbers 02/17/2020

## 2020-02-17 NOTE — Progress Notes (Signed)
Patient is requesting to take his Senna Plus, reports no BM x3 days. Dr. Luberta Robertson

## 2020-02-17 NOTE — Progress Notes (Signed)
  Echocardiogram 2D Echocardiogram has been performed.  Bruce Webb [name] 02/17/2020, 1:35 PM

## 2020-02-17 NOTE — Progress Notes (Signed)
Nutrition Follow-up  RD working remotely.  DOCUMENTATION CODES:   Not applicable  INTERVENTION:   -D/c Boost Breeze po TID, each supplement provides 250 kcal and 9 grams of protein -Ensure Enlive po TID, each supplement provides 350 kcal and 20 grams of protein -Continue MVI with minerals daily -Magic cup TID with meals, each supplement provides 290 kcal and 9 grams of protein -Feeding assistance with meals  NUTRITION DIAGNOSIS:   Inadequate oral intake related to altered GI function as evidenced by NPO status.  Progressing; advanced to soft diet  GOAL:   Patient will meet greater than or equal to 90% of their needs  Progressing   MONITOR:   PO intake, Supplement acceptance, Diet advancement, Labs, Weight trends, Skin, I & O's  REASON FOR ASSESSMENT:   Consult Diet education  ASSESSMENT:   Bruce Webb is a 79 y.o. 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  Patient had been diagnosed with COVID-19 on 01/16/2020, worsen despite outpatient treatment with monoclonal antibody and steroids, was admitted to the [facility] hospital, found to have pulmonary embolism while there, and was discharged on 01/31/2020 with supplemental oxygen and on Eliquis.  Patient reports that his dyspnea did not seem to improve or worsen since that discharge.  He has gone on to develop melena with some constipation and progressive vision loss involving the right eye.  He saw his ophthalmologist 5 days ago.  Since then, his vision has continued to worsen to the point where he can no longer see anything out of the right eye.  He denies any abdominal pain, indigestion, vomiting, or NSAID use.  He denies chest pain or  palpitations.  He denies any headache, change in hearing, or focal numbness or weakness.  EMS was called to the patient's residence, found him to be saturating in the 80s with SBP in the 90s, and he was treated with 800 mL of saline prior to arrival in the ED.  Reviewed I/O's: +907 ml x 24 hours and +2 L since admission  UOP: 700 ml x 24 hours  RD re-consulted by MD, requesting assistancewith increased protein intake. He has been advanced to a soft diet. EGD was planned today, however, no deferred secondary to worsening respiratory status. Hemoglobin has been stable.   Intake has been poor; noted meal completion 10-50%. Pt also with visual deficits.   Attempted to speak with pt and family members over the phone, however, no answer.   Lab Results  Component Value Date   HGBA1C 7.1 (H) 02/16/2020   PTA DM medications are none. Per ADA's Standards of Medical Care for Diabetes, glycemic targets for elderly patients who are otherwise healthy (few medical impairments) and cognitively intact should be less stringent (Hgb A1c <7.5).   Pt with poor oral intake and would benefit from nutrient dense supplement. One Ensure Enlive supplement provides 350 kcals, 20 grams protein, and 44-45 grams of carbohydrate vs one Glucerna shake supplement, which provides 220 kcals, 10 grams of protein, and 26 grams of carbohydrate. Given pt's hx of DM, RD will continue to monitor PO intake, CBGS, and adjust supplement regimen as appropriate.   Albumin has a half-life of 21 days and is strongly affected by stress response and inflammatory process, therefore, do not expect to  see an improvement in this lab value during acute hospitalization. When a patient presents with low albumin, it is likely skewed due to the acute inflammatory response.  Unless it is suspected that patient had poor PO intake or malnutrition prior to admission, then RD should not be consulted solely for low albumin. Note that low albumin is no longer  used to diagnose malnutrition; [place] uses the new malnutrition guidelines published by the American Society for Parenteral and Enteral Nutrition (A.S.P.E.N.) and the Academy of Nutrition and Dietetics (AND).    Labs reviewed: Na: 133, CBGS: 130-139 (inpatient orders for glycemic control are 0-9 units insulin aspart every 4 hours).   Diet Order:   Diet Order            DIET SOFT Room service appropriate? Yes; Fluid consistency: Thin  Diet effective now              EDUCATION NEEDS:   Not appropriate for education at this time  Skin:  Skin Assessment: Reviewed RN Assessment  Last BM:  02/16/20  Height:   Ht Readings from Last 1 Encounters:  02/15/20 5\' 2"  (1.575 m)    Weight:   Wt Readings from Last 1 Encounters:  02/17/20 52.6 kg    Ideal Body Weight:  53.6 kg  BMI:  Body mass index is 21.21 kg/m.  Estimated Nutritional Needs:   Kcal:  1650-1850  Protein:  80-95 grams  Fluid:  > 1.6 L    Loistine Chance, RD, LDN, [place] Registered Dietitian II Certified Diabetes Care and Education Specialist Please refer to [facility] for RD and/or RD on-call/weekend/after hours pager

## 2020-02-17 NOTE — Progress Notes (Signed)
Subjective: No complaints.  Objective: Vital signs in last 24 hours: Temp:  [97.4 F (36.3 C)-99.2 F (37.3 C)] 97.8 F (36.6 C) (02/23 1639) Pulse Rate:  [71-103] 97 (02/23 1614) Resp:  [18-24] 23 (02/23 1614) BP: (84-114)/(53-81) 84/53 (02/23 1639) SpO2:  [91 %-100 %] 94 % (02/23 1614) Weight:  [52.6 kg] 52.6 kg (02/23 0441) Last BM Date: 02/14/20  Intake/Output from previous day: 02/22 0701 - 02/23 0700 In: 1606.9 [P.O.:480; I.V.:553.4; IV Piggyback:573.5] Out: 700 [Urine:700] Intake/Output this shift: Total I/O In: 606.5 [P.O.:360; I.V.:246.5] Out: 2650 [Urine:2650]  General appearance: alert and no distress Resp: rhonchi Cardio: regular rate and rhythm GI: soft, non-tender; bowel sounds normal; no masses,  no organomegaly Extremities: extremities normal, atraumatic, no cyanosis or edema  Lab Results: Recent Labs    02/15/20 1640 02/16/20 1019 02/17/20 0715  WBC 28.1* 24.6* 24.9*  HGB 6.0* 8.6* 8.5*  HCT 19.0* 25.4* 25.8*  PLT 266 248 214   BMET Recent Labs    02/16/20 0541 02/16/20 1147 02/17/20 0715  NA 132* 134* 133*  K 3.9 3.9 3.6  CL 101 105 102  CO2 21* 19* 20*  GLUCOSE 151* 125* 141*  BUN 16 16 14   CREATININE 0.95 0.97 0.91  CALCIUM 7.6* 7.4* 7.3*   LFT Recent Labs    02/17/20 0715  PROT 4.6*  ALBUMIN 1.3*  AST 186*  ALT 158*  ALKPHOS 161*  BILITOT 1.1   PT/INR Recent Labs    02/15/20 1845  LABPROT 20.3*  INR 1.7*   Hepatitis Panel No results for input(s): HEPBSAG, HCVAB, HEPAIGM, HEPBIGM in the last 72 hours. C-Diff No results for input(s): CDIFFTOX in the last 72 hours. Fecal Lactopherrin No results for input(s): FECLLACTOFRN in the last 72 hours.  Studies/Results: DG Chest 2 View  Result Date: 02/16/2020 CLINICAL DATA:  Hypoxia. EXAM: CHEST - 2 VIEW COMPARISON:  02/15/2020 FINDINGS: Asymmetric airspace disease, right greater than left, is similar given the differential positioning. Probable small right pleural  effusion. Cardiopericardial silhouette is at upper limits of normal for size. The visualized bony structures of the thorax are intact. Telemetry leads overlie the chest. IMPRESSION: Similar appearance of asymmetric airspace disease, right greater than left. Electronically Signed   By: 02/17/2020 M.Webb.   On: 02/16/2020 09:59   CT Head Wo Contrast  Result Date: 02/15/2020 CLINICAL DATA:  79 year old male with history of right eye visual loss for the past 2 hours. EXAM: CT HEAD WITHOUT CONTRAST TECHNIQUE: Contiguous axial images were obtained from the base of the skull through the vertex without intravenous contrast. COMPARISON:  No priors. FINDINGS: Brain: Mild cerebral atrophy. Patchy and confluent areas of decreased attenuation are noted throughout the deep and periventricular white matter of the cerebral hemispheres bilaterally, compatible with chronic microvascular ischemic disease. No evidence of acute infarction, hemorrhage, hydrocephalus, extra-axial collection or mass lesion/mass effect. Vascular: No hyperdense vessel or unexpected calcification. Skull: Normal. Negative for fracture or focal lesion. Sinuses/Orbits: No acute finding. Other: None. IMPRESSION: 1. No acute intracranial abnormalities. 2. Mild cerebral atrophy with chronic microvascular ischemic changes in the cerebral white matter, as above. Electronically Signed   By: 70 M.Webb.   On: 02/15/2020 18:31   DG Chest Port 1 View  Result Date: 02/15/2020 CLINICAL DATA:  79 year old male with history of shortness of breath. Loss of vision. EXAM: PORTABLE CHEST 1 VIEW COMPARISON:  Chest x-ray 01/26/2020. FINDINGS: Patchy multifocal airspace consolidation throughout the lungs bilaterally, most confluent in the inferior aspect of the  right upper lobe. Aeration has significantly worsened compared to the prior study. Probable small right pleural effusion. No pneumothorax. No evidence of pulmonary edema. Heart size is borderline enlarged.  Upper mediastinal contours are within normal limits. IMPRESSION: 1. Worsening multilobar bilateral pneumonia, as above. 2. New small right pleural effusion. Electronically Signed   By: Trudie Reed M.Webb.   On: 02/15/2020 17:55   ECHOCARDIOGRAM COMPLETE  Result Date: 02/17/2020    ECHOCARDIOGRAM REPORT   Patient Name:   Bruce Webb Date of Exam: 02/17/2020 Medical Rec #:  [id number]      Height:       62.0 in Accession #:    [id number]     Weight:       116.0 lb Date of Birth:  [birth year]       BSA:          1.516 m Patient Age:    79 years       BP:           100/81 mmHg Patient Gender: M              HR:           90 bpm. Exam Location:  Inpatient Procedure: 2D Echo Indications:    Dyspnea 786.09 / R06.00  History:        Patient has no prior history of Echocardiogram examinations.                 Risk Factors:Diabetes and Hypertension. Pulmonary embolism                 Acute respiratory failure with hypoxia.  Sonographer:    Leeroy Bock Turrentine Referring Phys: [phone] PREETHA JOSEPH  Sonographer Comments: Image acquisition challenging due to respiratory motion. IMPRESSIONS  1. Left ventricular ejection fraction, by estimation, is 60 to 65%. The left ventricle has normal function. The left ventricle has no regional wall motion abnormalities. Left ventricular diastolic parameters are consistent with Grade II diastolic dysfunction (pseudonormalization).  2. The history indicates the patient has a pulmonary embolus. The RV is not dilated and there is normal RV function. . Right ventricular systolic function is low normal. The right ventricular size is normal.  3. The mitral valve is normal in structure and function. Mild mitral valve regurgitation. No evidence of mitral stenosis.  4. The aortic valve is normal in structure and function. Aortic valve regurgitation is not visualized. No aortic stenosis is present. FINDINGS  Left Ventricle: Left ventricular ejection fraction, by estimation, is 60 to 65%. The left  ventricle has normal function. The left ventricle has no regional wall motion abnormalities. The left ventricular internal cavity size was normal in size. There is  no left ventricular hypertrophy. Left ventricular diastolic parameters are consistent with Grade II diastolic dysfunction (pseudonormalization). Right Ventricle: The history indicates the patient has a pulmonary embolus. The RV is not dilated and there is normal RV function. The right ventricular size is normal. No increase in right ventricular wall thickness. Right ventricular systolic function is low normal. Left Atrium: Left atrial size was normal in size. Right Atrium: Right atrial size was normal in size. Pericardium: There is no evidence of pericardial effusion. Mitral Valve: The mitral valve is normal in structure and function. Mild mitral valve regurgitation. No evidence of mitral valve stenosis. Tricuspid Valve: The tricuspid valve is normal in structure. Tricuspid valve regurgitation is trivial. Aortic Valve: The aortic valve is normal in structure and function. Aortic valve regurgitation is not visualized.  No aortic stenosis is present. Aortic valve mean gradient measures 5.0 mmHg. Aortic valve peak gradient measures 8.0 mmHg. Aortic valve area, by VTI measures 2.64 cm. Pulmonic Valve: The pulmonic valve was normal in structure. Pulmonic valve regurgitation is not visualized. Aorta: The aortic root and ascending aorta are structurally normal, with no evidence of dilitation. IAS/Shunts: The atrial septum is grossly normal.  LEFT VENTRICLE PLAX 2D LVIDd:         3.70 cm  Diastology LVIDs:         2.70 cm  LV e' lateral:   6.42 cm/s LV PW:         0.90 cm  LV E/e' lateral: 10.1 LV IVS:        0.90 cm  LV e' medial:    5.66 cm/s LVOT diam:     1.90 cm  LV E/e' medial:  11.4 LV SV:         61 LV SV Index:   40 LVOT Area:     2.84 cm  RIGHT VENTRICLE RV S prime:     17.00 cm/s LEFT ATRIUM             Index LA diam:        3.40 cm 2.24 cm/m LA Vol  (A2C):   30.0 ml 19.78 ml/m LA Vol (A4C):   28.2 ml 18.60 ml/m LA Biplane Vol: 29.4 ml 19.39 ml/m  AORTIC VALVE AV Area (Vmax):    2.25 cm AV Area (Vmean):   2.30 cm AV Area (VTI):     2.64 cm AV Vmax:           141.00 cm/s AV Vmean:          105.000 cm/s AV VTI:            0.230 m AV Peak Grad:      8.0 mmHg AV Mean Grad:      5.0 mmHg LVOT Vmax:         112.00 cm/s LVOT Vmean:        85.300 cm/s LVOT VTI:          0.214 m LVOT/AV VTI ratio: 0.93  AORTA Ao Root diam: 3.30 cm MITRAL VALVE MV Area (PHT): 3.99 cm    SHUNTS MV Decel Time: 190 msec    Systemic VTI:  0.21 m MV E velocity: 64.70 cm/s  Systemic Diam: 1.90 cm MV A velocity: 72.40 cm/s MV E/A ratio:  0.89 Bruce Moores MD Electronically signed by Bruce Moores MD Signature Date/Time: 02/17/2020/3:13:26 PM    Final     Medications:  Scheduled: . brimonidine  1 drop Both Eyes BID  . dorzolamide  1 drop Both Eyes BID  . feeding supplement (ENSURE ENLIVE)  237 mL Oral TID BM  . feeding supplement (PRO-STAT SUGAR FREE 64)  30 mL Oral BID  . guaiFENesin  600 mg Oral BID  . insulin aspart  0-9 Units Subcutaneous Q4H  . latanoprost  1 drop Both Eyes QHS  . midodrine  10 mg Oral BID WC  . multivitamin with minerals  1 tablet Oral Daily  . pantoprazole (PROTONIX) IV  40 mg Intravenous Q12H  . sodium chloride flush  3 mL Intravenous Q12H  . sodium chloride flush  3 mL Intravenous Q12H   Continuous: . sodium chloride    . azithromycin 500 mg (02/17/20 0023)  . ceFEPime (MAXIPIME) IV Stopped (02/17/20 1234)  . heparin 650 Units/hr (02/17/20 1611)    Assessment/Plan: 1) GI  bleed. 2) Hypoxic respiratory failure. 3) Recent history of PE.   The patient's procedure was cancelled by anesthesia as he was on 10 liter of oxygen.  Auscultation reveals rhonchi in his lung fields, but he does not appear to be in any distress.  The patient is fragile and high risk for endoscopy, at this time.  The case was discussed with Dr. Jomarie Longs.  With his need  for anticoagulation and the inability to evaluate his GI tract, the decision was made to start him on heparin.  If he starts to have bleeding again, an IVC filter will be placed.  LOS: 2 days   Bruce Webb 02/17/2020, 5:03 PM

## 2020-02-17 NOTE — Plan of Care (Signed)
Able to wean Oxygen today, pt OOB to chair desat and able to recoop after 5 min.  Noted patent to cough with water, upon further investigation patient does not tolerate cold liquids and make him cough also prefers [place] food and is only a vegetarian.

## 2020-02-17 NOTE — Progress Notes (Addendum)
PROGRESS NOTE    Bruce Webb  ZYS:[id number] DOB: 1941-10-16 DOA: 02/15/2020 PCP: Glenda Chroman, MD  Brief Narrative: Bruce Webb is a 79 year old male with history of diabetes mellitus type 2, hypertension, left eye vision loss was hospitalized with severe respiratory failure from Covid month ago, diagnosed with Covid on 1/22, hospitalized on 2/1 and discharged home 2/6 on 2 L home O2.  He was also diagnosed with subacute pulmonary embolism and started on Eliquis then -Over the past month he has had deterioration of vision in his right eye as well, saw Dr. Midge Aver with ophthalmology on 2/16 and was diagnosed with end-stage glaucoma of both eyes, he was told that he would lose vision in his right eye as well unfortunately, also started on maximal therapy for glaucoma with multiple eyedrops, his IOP was normal at the time. -Presented to the emergency room with worsening dyspnea yesterday associated with worsening vision loss in his right eye.  -In the emergency room he was noted to have a hemoglobin of 6.0 with heme positive stools down from 8.8 at discharge 3 weeks ago, also required a nonrebreather mask initially now down to 2 to 3 L of FiO2, chest x-ray showed worsening bilateral infiltrates and WBC of 28K. -Since admission he was transfused 2 units of PRBC, started on IV Protonix and antibiotics, gastroenterology was consulted, overnight 2/23 his oxygen requirements worsened, up to 10 L high flow nasal cannula -Developed fluid overload following blood transfusion, and secondary to third spacing from severe hypoalbuminemia, given Lasix 2/23  Assessment & Plan:  Melena, Acute blood loss anemia -History of dark stools prior to admission, Eliquis was held on admission -Transfused 2 units of PRBC overnight -Repeat hemoglobin in the 8.5 range, no active bleeding for the last 48 hours -Gastroenterology Dr. Benson Norway consulted -Plan was for possible endoscopy today however overnight with worsening  oxygen requirements this was deferred -Continue IV PPI today, since hemoglobin is stable without active bleeding will start him on IV heparin given recent pulmonary embolism  -Monitor hemoglobin closely  Acute on chronic hypoxic respiratory failure -Multifocal pneumonia -Fluid overload, third spacing -Recent pulmonary embolism -Recent Covid pneumonia with severe hypoxic respiratory failure, discharged home on 2 L O2 -Chest x-ray with worsening infiltrates, worsening leukocytosis as well -suspect secondary bacterial infection given WBC of 28k on admission, elevated procalcitonin level as well -Started on broad-spectrum antibiotics on admission, continue cefepime and azithromycin  -Blood cultures are negative, white blood cell count remains elevated however improving from admission  -Overnight with worsening oxygen requirements up to 10 L high flow, on exam today he has positive JVD, worsening basilar crackles, exam concerning for fluid overload too, given Lasix 40 mg x 1, followed by brisk urine output of 1700 mL -I suspect this is secondary to third spacing, he has severe hypoalbuminemia with albumin ranging from 1.3-1.5, we were able to wean him down to 5/6 L -Add oral Lasix daily as well -Monitor electrolytes, monitor urine output -Also check 2D echocardiogram  End-stage glaucoma both eyes -Lost vision in his left eye 8 years ago -Worsening vision loss in his right eye for the past month -Saw Dr. Midge Aver for the first time on 2/16, on exam was noted to have end-stage glaucoma of both eyes, and was told that he will lose vision in his right eye as well -I called and discussed with Dr. 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 -Eliquis held on admission in light of GI bleed, thankfully this was a small, subacute pulmonary embolism -start IV heparin today, no active bleeding since admission -Monitor hemoglobin  Hypertension -Blood pressures are soft, hold antihypertensives,  -monitor hemoglobin  Type 2 diabetes mellitus -Hemoglobin A1c 7.1, CBGs are stable, continue sliding scale insulin  Severe protein calorie malnutrition -Albumin is 1.3-1.5 -RD consulted, needs to increase protein intake considerably  Hypocalcemia -Corrected calcium for albumin is normal  DVT prophylaxis: Start IV heparin Code Status: Full code Family Communication: No family at bedside, updated nephew Chelsea Aus, Cardiology PA 2/22, 2/23, called and updated grandson Lerry Cordrey on 2/22, unable to reach him today, made 2 attempts Disposition Plan: Home pending improvement in respiratory failure  Consultants:   Gastroenterology Dr. Elnoria Howard  Discussed with ophthalmology Dr. Marchelle Gearing   Procedures:   Antimicrobials:    Subjective: -Overnight had worsening oxygen requirements, now on 10 L high flow nasal cannula, patient reports that he was coughing more last night, denies any chest pain  Objective: Vitals:   02/17/20 1100 02/17/20 1153 02/17/20 1200 02/17/20 1202  BP: 94/62 (!) 92/59  100/81  Pulse: 93  78 90  Resp: (!) 24  20 (!) 22  Temp:  (!) 97.4 F (36.3 C)    TempSrc:  Oral    SpO2: 100%  100% 100%  Weight:      Height:        Intake/Output Summary (Last 24 hours) at 02/17/2020 1325 Last data filed at 02/17/2020 1130 Gross per 24 hour  Intake 1486.89 ml  Output 2450 ml  Net -963.11 ml   Filed Weights   02/15/20 1658 02/16/20 0001 02/17/20 0441  Weight: 67.2 kg 52 kg 52.6 kg    Examination:  Gen: Chronically ill elderly male  sitting up in bed awake alert oriented, no distress HEENT: Positive JVD Lungs: Fine basilar crackles worsened in lower lungs CVS: S1-S2, regular rate rhythm Abd: soft, Non tender, non distended, BS present Extremities: Trace edema Skin: No rashes on exposed skin Psychiatry:  Mood & affect appropriate.     Data Reviewed:   CBC: Recent Labs  Lab 02/15/20 1640 02/16/20 1019 02/17/20 0715  WBC 28.1* 24.6* 24.9*  NEUTROABS 23.2*  --   --   HGB 6.0* 8.6* 8.5*  HCT 19.0* 25.4* 25.8*  MCV 87.2 83.0 84.0  PLT 266 248 214   Basic Metabolic Panel: Recent Labs  Lab 02/15/20 1640 02/16/20 0541 02/16/20 1147 02/17/20 0715  NA 127* 132* 134* 133*  K 4.0 3.9 3.9 3.6  CL 98 101 105 102  CO2 20* 21* 19* 20*  GLUCOSE 216* 151* 125* 141*  BUN 21 16 16 14   CREATININE 1.11 0.95 0.97 0.91  CALCIUM 7.4* 7.6* 7.4* 7.3*   GFR: Estimated Creatinine Clearance: 49.8 mL/min (by C-G formula based on SCr of 0.91 mg/dL). Liver Function Tests: Recent Labs  Lab 02/16/20 0541 02/17/20 0715  AST 119* 186*  ALT 120* 158*  ALKPHOS 130* 161*  BILITOT 1.3* 1.1  PROT 5.0* 4.6*  ALBUMIN 1.5* 1.3*   No results for input(s): LIPASE, AMYLASE in the last 168 hours. No results for input(s): AMMONIA in the last 168 hours. Coagulation Profile: Recent  Labs  Lab 02/15/20 1845  INR 1.7*   Cardiac Enzymes: No results for input(s): CKTOTAL, CKMB, CKMBINDEX, TROPONINI in the last 168 hours. BNP (last 3 results) No results for input(s): PROBNP in the last 8760 hours. HbA1C: Recent Labs    02/16/20 0541  HGBA1C 7.1*   CBG: Recent Labs  Lab 02/16/20 2012 02/17/20 0057 02/17/20 0445 02/17/20 0725 02/17/20 1105  GLUCAP 203* 111* 130* 139* 131*   Lipid Profile: No results for input(s): CHOL, HDL, LDLCALC, TRIG, CHOLHDL, LDLDIRECT in the last 72 hours. Thyroid Function Tests: No results for input(s): TSH, T4TOTAL, FREET4, T3FREE, THYROIDAB in the last 72 hours. Anemia Panel: No results for  input(s): VITAMINB12, FOLATE, FERRITIN, TIBC, IRON, RETICCTPCT in the last 72 hours. Urine analysis: No results found for: COLORURINE, APPEARANCEUR, LABSPEC, PHURINE, GLUCOSEU, HGBUR, BILIRUBINUR, KETONESUR, PROTEINUR, UROBILINOGEN, NITRITE, LEUKOCYTESUR Sepsis Labs: @LABRCNTIP (procalcitonin:4,lacticidven:4)  ) Recent Results (from the past 240 hour(s))  Culture, blood (routine x 2)     Status: None (Preliminary result)   Collection Time: 02/15/20  6:30 PM   Specimen: BLOOD  Result Value Ref Range Status   Specimen Description BLOOD SITE NOT SPECIFIED  Final   Special Requests   Final    BOTTLES DRAWN AEROBIC AND ANAEROBIC Blood Culture adequate volume   Culture   Final    NO GROWTH 2 DAYS Performed at [facility], [address], [place], [place]    Report Status PENDING  Incomplete  Culture, blood (routine x 2)     Status: None (Preliminary result)   Collection Time: 02/15/20  6:45 PM   Specimen: BLOOD  Result Value Ref Range Status   Specimen Description BLOOD SITE NOT SPECIFIED  Final   Special Requests   Final    BOTTLES DRAWN AEROBIC AND ANAEROBIC Blood Culture adequate volume   Culture   Final    NO GROWTH 2 DAYS Performed at [facility], [address], [place], [place]    Report Status PENDING  Incomplete         Radiology Studies: DG Chest 2 View  Result Date: 02/16/2020 CLINICAL DATA:  Hypoxia. EXAM: CHEST - 2 VIEW COMPARISON:  02/15/2020 FINDINGS: Asymmetric airspace disease, right greater than left, is similar given the differential positioning. Probable small right pleural effusion. Cardiopericardial silhouette is at upper limits of normal for size. The visualized bony structures of the thorax are intact. Telemetry leads overlie the chest. IMPRESSION: Similar appearance of asymmetric airspace disease, right greater than left. Electronically Signed   By: 02/17/2020 M.D.   On: 02/16/2020 09:59   CT Head Wo Contrast  Result  Date: 02/15/2020 CLINICAL DATA:  79 year old male with history of right eye visual loss for the past 2 hours. EXAM: CT HEAD WITHOUT CONTRAST TECHNIQUE: Contiguous axial images were obtained from the base of the skull through the vertex without intravenous contrast. COMPARISON:  No priors. FINDINGS: Brain: Mild cerebral atrophy. Patchy and confluent areas of decreased attenuation are noted throughout the deep and periventricular white matter of the cerebral hemispheres bilaterally, compatible with chronic microvascular ischemic disease. No evidence of acute infarction, hemorrhage, hydrocephalus, extra-axial collection or mass lesion/mass effect. Vascular: No hyperdense vessel or unexpected calcification. Skull: Normal. Negative for fracture or focal lesion. Sinuses/Orbits: No acute finding. Other: None. IMPRESSION: 1. No acute intracranial abnormalities. 2. Mild cerebral atrophy with chronic microvascular ischemic changes in the cerebral white matter, as above. Electronically Signed   By: 70.D.  On: 02/15/2020 18:31   DG Chest Port 1 View  Result Date: 02/15/2020 CLINICAL DATA:  79 year old male with history of shortness of breath. Loss of vision. EXAM: PORTABLE CHEST 1 VIEW COMPARISON:  Chest x-ray 01/26/2020. FINDINGS: Patchy multifocal airspace consolidation throughout the lungs bilaterally, most confluent in the inferior aspect of the right upper lobe. Aeration has significantly worsened compared to the prior study. Probable small right pleural effusion. No pneumothorax. No evidence of pulmonary edema. Heart size is borderline enlarged. Upper mediastinal contours are within normal limits. IMPRESSION: 1. Worsening multilobar bilateral pneumonia, as above. 2. New small right pleural effusion. Electronically Signed   By: Trudie Reed M.D.   On: 02/15/2020 17:55        Scheduled Meds: . brimonidine  1 drop Both Eyes BID  . dorzolamide  1 drop Both Eyes BID  . feeding supplement  1  Container Oral TID BM  . furosemide  20 mg Oral Daily  . insulin aspart  0-9 Units Subcutaneous Q4H  . latanoprost  1 drop Both Eyes QHS  . multivitamin with minerals  1 tablet Oral Daily  . pantoprazole (PROTONIX) IV  40 mg Intravenous Q12H  . sodium chloride flush  3 mL Intravenous Q12H  . sodium chloride flush  3 mL Intravenous Q12H   Continuous Infusions: . sodium chloride    . azithromycin 500 mg (02/17/20 0023)  . ceFEPime (MAXIPIME) IV 2 g (02/17/20 1006)  . heparin       LOS: 2 days    Time spent:  Zannie Cove, MD [facility]  02/17/2020, 1:25 PM

## 2020-02-18 LAB — GLUCOSE, CAPILLARY
Glucose-Capillary: 125 mg/dL — ABNORMAL HIGH (ref 70–99)
Glucose-Capillary: 162 mg/dL — ABNORMAL HIGH (ref 70–99)
Glucose-Capillary: 168 mg/dL — ABNORMAL HIGH (ref 70–99)
Glucose-Capillary: 218 mg/dL — ABNORMAL HIGH (ref 70–99)
Glucose-Capillary: 260 mg/dL — ABNORMAL HIGH (ref 70–99)

## 2020-02-18 LAB — CBC
HCT: 24.4 % — ABNORMAL LOW (ref 39.0–52.0)
Hemoglobin: 8.3 g/dL — ABNORMAL LOW (ref 13.0–17.0)
MCH: 28.1 pg (ref 26.0–34.0)
MCHC: 34 g/dL (ref 30.0–36.0)
MCV: 82.7 fL (ref 80.0–100.0)
Platelets: 227 10*3/uL (ref 150–400)
RBC: 2.95 MIL/uL — ABNORMAL LOW (ref 4.22–5.81)
RDW: 15.7 % — ABNORMAL HIGH (ref 11.5–15.5)
WBC: 29.4 10*3/uL — ABNORMAL HIGH (ref 4.0–10.5)
nRBC: 0 % (ref 0.0–0.2)

## 2020-02-18 LAB — HEPARIN LEVEL (UNFRACTIONATED)
Heparin Unfractionated: 0.14 IU/mL — ABNORMAL LOW (ref 0.30–0.70)
Heparin Unfractionated: 0.11 IU/mL — ABNORMAL LOW (ref 0.30–0.70)

## 2020-02-18 LAB — APTT
aPTT: 32 seconds (ref 24–36)
aPTT: 37 seconds — ABNORMAL HIGH (ref 24–36)
aPTT: 31 seconds (ref 24–36)

## 2020-02-18 LAB — MAGNESIUM: Magnesium: 1.7 mg/dL (ref 1.7–2.4)

## 2020-02-18 LAB — BASIC METABOLIC PANEL
Anion gap: 8 (ref 5–15)
BUN: 26 mg/dL — ABNORMAL HIGH (ref 8–23)
CO2: 25 mmol/L (ref 22–32)
Calcium: 7.3 mg/dL — ABNORMAL LOW (ref 8.9–10.3)
Chloride: 98 mmol/L (ref 98–111)
Creatinine, Ser: 1 mg/dL (ref 0.61–1.24)
GFR calc Af Amer: >60 mL/min (ref >60)
GFR calc non Af Amer: >60 mL/min (ref >60)
Glucose, Bld: 154 mg/dL — ABNORMAL HIGH (ref 70–99)
Potassium: 3 mmol/L — ABNORMAL LOW (ref 3.5–5.1)
Sodium: 131 mmol/L — ABNORMAL LOW (ref 135–145)

## 2020-02-18 MED ORDER — POTASSIUM CHLORIDE CRYS ER 20 MEQ PO TBCR
40.0000 meq | EXTENDED_RELEASE_TABLET | Freq: Once | ORAL | Status: AC
  Administered 2020-02-18: 40 meq via ORAL
  Filled 2020-02-18: qty 2

## 2020-02-18 MED ORDER — MAGNESIUM OXIDE 400 (241.3 MG) MG PO TABS
400.0000 mg | ORAL_TABLET | Freq: Two times a day (BID) | ORAL | Status: AC
  Administered 2020-02-18 – 2020-02-19 (×4): 400 mg via ORAL
  Filled 2020-02-18 (×3): qty 1

## 2020-02-18 NOTE — Progress Notes (Signed)
ANTICOAGULATION CONSULT NOTE   Pharmacy Consult for heparin Indication: pulmonary embolus  No Known Allergies  Patient Measurements: Height: 5\' 2"  (157.5 cm) Weight: 115 lb 15.4 oz (52.6 kg) IBW/kg (Calculated) : 54.6 Heparin Dosing Weight: 52kg  Vital Signs: Temp: 98.1 F (36.7 C) (02/24 0000) Temp Source: Oral (02/24 0000) BP: 96/52 (02/24 0000) Pulse Rate: 85 (02/24 0000)  Labs: Recent Labs     0000 02/15/20 1640 02/15/20 1845 02/16/20 0541 02/16/20 1019 02/16/20 1147 02/17/20 0715 02/17/20 1646 02/17/20 2301  HGB   < > 6.0*  --   --  8.6*  --  8.5*  --   --   HCT  --  19.0*  --   --  25.4*  --  25.8*  --   --   PLT  --  266  --   --  248  --  214  --   --   APTT  --   --   --   --   --   --   --   --  32  LABPROT  --   --  20.3*  --   --   --   --   --   --   INR  --   --  1.7*  --   --   --   --   --   --   HEPARINUNFRC  --   --   --   --   --   --   --   --  0.14*  CREATININE  --  1.11  --    < >  --  0.97 0.91 1.04  --   TROPONINIHS  --  12 7  --   --   --   --   --   --    < > = values in this interval not displayed.    Estimated Creatinine Clearance: 43.6 mL/min (by C-G formula based on SCr of 1.04 mg/dL).   Assessment: 44 yoM on apixaban at home due to recent subacute PE in February. Apixaban held on admit with GIB, pharmacy now asked to start IV heparin. Last dose of apixaban was 2/21.  Heparin level 0.14 (likely falsely elevated due to apixaban). PTT 32 sec (subtherapeutic) on gtt at 650 units/hr. No issues with line or bleeding reported per RN.  Goal of Therapy:  Heparin level 0.3-0.5 units/ml aPTT 66-85 seconds Monitor platelets by anticoagulation protocol: Yes   Plan:  Increase heparin to 800 units/h PTT and heparin level in 8 hours  3/21, PharmD, BCPS Please see amion for complete clinical pharmacist phone list 02/18/2020

## 2020-02-18 NOTE — Progress Notes (Signed)
PROGRESS NOTE    Nature Braverman  WJX:[id number] DOB: 05-22-1941 DOA: 02/15/2020 PCP: Ignatius Specking, MD  Brief Narrative:  79-year-old gentleman prior history of essential hypertension, type 2 diabetes mellitus, recent COVID-19 illness initially diagnosed on 01/16/2020, left eye vision loss, recent discharge from the hospital on 01/31/2020 on 2 L of nasal cannula oxygen, pulmonary embolism presents to ED with worsening shortness of breath and worsening vision loss in his right eye.  Of note patient was seen by Dr. Alden Hipp with ophthalmology on 02/10/2020 and was diagnosed with end-stage glaucoma of both eyes despite maximal treatment with multiple eyedrops. On arrival to ED his chest x-ray was found to have bilateral infiltrates and elevated WBC count of 28,000's.  He was also found to have anemia with a hemoglobin of 6, underwent 2 units of PRBC transfusion.  He was started on IV Protonix for possible blood loss anemia and IV antibiotics for superimposed multifocal pneumonia.  Gastroenterology was consulted and initially was scheduled for endoscopy but in view of his tenuous respiratory status endoscopy was canceled.   Assessment & Plan:   Principal Problem:   Acute GI bleeding Active Problems:   Essential hypertension   Pulmonary embolism (HCC)   Multifocal pneumonia   Hyponatremia   Hyperglycemia   Melena, acute blood loss anemia Suspicious for upper GI bleed.  GI was consulted and he was initially scheduled to have an upper endoscopy but was canceled as he had worsening oxygen requirement.   S/p 2 units of PRBC transfusion and patient's hemoglobin has been stable. Continue with IV PPI twice daily. In view of his recent pulmonary embolism IV heparin was continued.     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 Patient was recently admitted for  severe hypoxic respiratory failure from Covid illness and was discharged on 01/31/2020 on 2 L of oxygen. He was readmitted 2 days ago for secondary bacterial pneumonia with leukocytosis and elevated procalcitonin. He was started on broad-spectrum IV antibiotics and he is requiring up to 6 L of nasal cannula oxygen to keep sats greater than 90%. His BNP was elevated at 234.8 and his echocardiogram showed Left ventricular ejection fraction, by estimation, is 60 to 65%. The left ventricle has no regional wall motion abnormalities. Left ventricular diastolic parameters are consistent with Grade II diastolic  dysfunction (pseudonormalization).  He was started on IV Lasix and has diuresed about 3.6 L in the last 24 hours. Recommend to continue with IV Lasix and broad-spectrum IV antibiotics and wean oxygen as appropriate. Follow pro calcitonin and watch renal parameters while on IV Lasix.      Elevated liver enzymes Patient denies any nausea or vomiting or abdominal pain Repeat liver panel probably secondary to congestion from acute diastolic heart failure.    Mild hyponatremia Patient is asymptomatic at this time and could be secondary to fluid overload.   Hypokalemia Probably secondary to diuresis Replace and repeat in the morning.    Essential hypertension Blood pressure parameters are well controlled.    Pulmonary embolism Patient is currently on IV heparin, watch for bleeding episodes and follow hemoglobin.    DVT prophylaxis: Heparin Code Status: Full code Family Communication: Discussed with family over the phone.   Disposition Plan:  Patient is from home and still requiring up to 6 L of nasal cannula oxygen and is not stable for discharge yet.  Consultants:   None  Procedures: None Antimicrobials: Cefepime and azithromycin. Subjective: Patient reports bowel movement last night.  Patient denies any chest pain or shortness of breath, abdominal pain, nausea or  vomiting  Objective: Vitals:   02/18/20 0214 02/18/20 0217 02/18/20 0415 02/18/20 1201  BP: 98/62  (!) 92/53 (!) 107/58  Pulse: 74 98 93 91  Resp: 20 20 20    Temp:   98.3 F (36.8 C) 98.3 F (36.8 C)  TempSrc:   Oral Oral  SpO2: 98% 96% 100% 98%  Weight:   52.1 kg   Height:        Intake/Output Summary (Last 24 hours) at 02/18/2020 1615 Last data filed at 02/18/2020 1014 Gross per 24 hour  Intake 654.46 ml  Output 1000 ml  Net -345.54 ml   Filed Weights   02/16/20 0001 02/17/20 0441 02/18/20 0415  Weight: 52 kg 52.6 kg 52.1 kg    Examination:  General exam: Appears calm and comfortable on 6 L of nasal cannula oxygen Respiratory system: Diminished air entry at bases, no wheezing or rhonchi Cardiovascular system: S1 & S2 heard, RRR.02/20/20 No pedal edema. Gastrointestinal system: Abdomen is nondistended, soft and nontender.Normal bowel sounds heard. Central nervous system: Alert and oriented.  Grossly nonfocal  extremities: Symmetric 5 x 5 power. Skin: No rashes, lesions or ulcers Psychiatry: . Mood & affect appropriate.     Data Reviewed: I have personally reviewed following labs and imaging studies  CBC: Recent Labs  Lab 02/15/20 1640 02/16/20 1019 02/17/20 0715 02/18/20 0505  WBC 28.1* 24.6* 24.9* 29.4*  NEUTROABS 23.2*  --   --   --   HGB 6.0* 8.6* 8.5* 8.3*  HCT 19.0* 25.4* 25.8* 24.4*  MCV 87.2 83.0 84.0 82.7  PLT 266 248 214 227   Basic Metabolic Panel: Recent Labs  Lab 02/16/20 0541 02/16/20 1147 02/17/20 0715 02/17/20 1646 02/18/20 0505  NA 132* 134* 133* 131* 131*  K 3.9 3.9 3.6 3.3* 3.0*  CL 101 105 102 97* 98  CO2 21* 19* 20* 23 25  GLUCOSE 151* 125* 141* 256* 154*  BUN 16 16 14 13  26*  CREATININE 0.95 0.97 0.91 1.04 1.00  CALCIUM 7.6* 7.4* 7.3* 7.6* 7.3*  MG  --   --   --   --  1.7   GFR: Estimated Creatinine Clearance: 44.9 mL/min (by C-G formula based on SCr of 1 mg/dL). Liver Function Tests: Recent Labs  Lab 02/16/20 0541  02/17/20 0715  AST 119* 186*  ALT 120* 158*  ALKPHOS 130* 161*  BILITOT 1.3* 1.1  PROT 5.0* 4.6*  ALBUMIN 1.5* 1.3*   No results for input(s): LIPASE, AMYLASE in the last 168 hours. No results for input(s): AMMONIA in the last 168 hours. Coagulation Profile: Recent Labs  Lab 02/15/20 1845  INR 1.7*   Cardiac Enzymes: No results for input(s): CKTOTAL, CKMB, CKMBINDEX, TROPONINI in the last 168 hours. BNP (last 3 results) No results for input(s): PROBNP in the last 8760 hours. HbA1C: Recent Labs    02/16/20 0541  HGBA1C 7.1*   CBG: Recent Labs  Lab 02/18/20 0009 02/18/20 0415 02/18/20 0733 02/18/20 1129 02/18/20 1607  GLUCAP 218* 168* 125* 260* 162*   Lipid Profile: No results for input(s): CHOL, HDL, LDLCALC, TRIG, CHOLHDL, LDLDIRECT in the last 72 hours. Thyroid Function Tests: No results for input(s): TSH, T4TOTAL, FREET4, T3FREE, THYROIDAB in the last 72 hours. Anemia Panel: No results for input(s): VITAMINB12, FOLATE, FERRITIN, TIBC, IRON, RETICCTPCT in the last 72 hours. Sepsis Labs: Recent  Labs  Lab 02/15/20 1845 02/16/20 0541 02/17/20 0715  PROCALCITON  --  0.66 0.50  LATICACIDVEN 1.8  --   --     Recent Results (from the past 240 hour(s))  Culture, blood (routine x 2)     Status: None (Preliminary result)   Collection Time: 02/15/20  6:30 PM   Specimen: BLOOD  Result Value Ref Range Status   Specimen Description BLOOD SITE NOT SPECIFIED  Final   Special Requests   Final    BOTTLES DRAWN AEROBIC AND ANAEROBIC Blood Culture adequate volume   Culture   Final    NO GROWTH 3 DAYS Performed at [facility], [address], [place], [place] [address]    Report Status PENDING  Incomplete  Culture, blood (routine x 2)     Status: None (Preliminary result)   Collection Time: 02/15/20  6:45 PM   Specimen: BLOOD  Result Value Ref Range Status   Specimen Description BLOOD SITE NOT SPECIFIED  Final   Special Requests   Final    BOTTLES DRAWN  AEROBIC AND ANAEROBIC Blood Culture adequate volume   Culture   Final    NO GROWTH 3 DAYS Performed at [facility], [address] [place], [place] [address]    Report Status PENDING  Incomplete         Radiology Studies: ECHOCARDIOGRAM COMPLETE  Result Date: 02/17/2020    ECHOCARDIOGRAM REPORT   Patient Name:   Bruce Webb [facility] [place] Date of Exam: 02/17/2020 Medical Rec #:  [id number]      Height:       62.0 in Accession #:    [id number]     Weight:       116.0 lb Date of Birth:  1941/06/02       BSA:          1.516 m Patient Age:    79 years       BP:           100/81 mmHg Patient Gender: M              HR:           90 bpm. Exam Location:  Inpatient Procedure: 2D Echo Indications:    Dyspnea 786.09 / R06.00  History:        Patient has no prior history of Echocardiogram examinations.                 Risk Factors:Diabetes and Hypertension. Pulmonary embolism                 Acute respiratory failure with hypoxia.  Sonographer:    Leeroy Bock Turrentine Referring Phys: [phone] PREETHA JOSEPH  Sonographer Comments: Image acquisition challenging due to respiratory motion. IMPRESSIONS  1. Left ventricular ejection fraction, by estimation, is 60 to 65%. The left ventricle has normal function. The left ventricle has no regional wall motion abnormalities. Left ventricular diastolic parameters are consistent with Grade II diastolic dysfunction (pseudonormalization).  2. The history indicates the patient has a pulmonary embolus. The RV is not dilated and there is normal RV function. . Right ventricular systolic function is low normal. The right ventricular size is normal.  3. The mitral valve is normal in structure and function. Mild mitral valve regurgitation. No evidence of mitral stenosis.  4. The aortic valve is normal in structure and function. Aortic valve regurgitation is not visualized. No aortic stenosis is present. FINDINGS  Left Ventricle: Left ventricular ejection fraction, by estimation,  is 60 to 65%. The  left ventricle has normal function. The left ventricle has no regional wall motion abnormalities. The left ventricular internal cavity size was normal in size. There is  no left ventricular hypertrophy. Left ventricular diastolic parameters are consistent with Grade II diastolic dysfunction (pseudonormalization). Right Ventricle: The history indicates the patient has a pulmonary embolus. The RV is not dilated and there is normal RV function. The right ventricular size is normal. No increase in right ventricular wall thickness. Right ventricular systolic function is low normal. Left Atrium: Left atrial size was normal in size. Right Atrium: Right atrial size was normal in size. Pericardium: There is no evidence of pericardial effusion. Mitral Valve: The mitral valve is normal in structure and function. Mild mitral valve regurgitation. No evidence of mitral valve stenosis. Tricuspid Valve: The tricuspid valve is normal in structure. Tricuspid valve regurgitation is trivial. Aortic Valve: The aortic valve is normal in structure and function. Aortic valve regurgitation is not visualized. No aortic stenosis is present. Aortic valve mean gradient measures 5.0 mmHg. Aortic valve peak gradient measures 8.0 mmHg. Aortic valve area, by VTI measures 2.64 cm. Pulmonic Valve: The pulmonic valve was normal in structure. Pulmonic valve regurgitation is not visualized. Aorta: The aortic root and ascending aorta are structurally normal, with no evidence of dilitation. IAS/Shunts: The atrial septum is grossly normal.  LEFT VENTRICLE PLAX 2D LVIDd:         3.70 cm  Diastology LVIDs:         2.70 cm  LV e' lateral:   6.42 cm/s LV PW:         0.90 cm  LV E/e' lateral: 10.1 LV IVS:        0.90 cm  LV e' medial:    5.66 cm/s LVOT diam:     1.90 cm  LV E/e' medial:  11.4 LV SV:         61 LV SV Index:   40 LVOT Area:     2.84 cm  RIGHT VENTRICLE RV S prime:     17.00 cm/s LEFT ATRIUM             Index LA diam:        3.40 cm 2.24 cm/m  LA Vol (A2C):   30.0 ml 19.78 ml/m LA Vol (A4C):   28.2 ml 18.60 ml/m LA Biplane Vol: 29.4 ml 19.39 ml/m  AORTIC VALVE AV Area (Vmax):    2.25 cm AV Area (Vmean):   2.30 cm AV Area (VTI):     2.64 cm AV Vmax:           141.00 cm/s AV Vmean:          105.000 cm/s AV VTI:            0.230 m AV Peak Grad:      8.0 mmHg AV Mean Grad:      5.0 mmHg LVOT Vmax:         112.00 cm/s LVOT Vmean:        85.300 cm/s LVOT VTI:          0.214 m LVOT/AV VTI ratio: 0.93  AORTA Ao Root diam: 3.30 cm MITRAL VALVE MV Area (PHT): 3.99 cm    SHUNTS MV Decel Time: 190 msec    Systemic VTI:  0.21 m MV E velocity: 64.70 cm/s  Systemic Diam: 1.90 cm MV A velocity: 72.40 cm/s MV E/A ratio:  0.89 Mertie Moores MD Electronically signed by Mertie Moores  MD Signature Date/Time: 02/17/2020/3:13:26 PM    Final         Scheduled Meds: . brimonidine  1 drop Both Eyes BID  . dorzolamide  1 drop Both Eyes BID  . feeding supplement (ENSURE ENLIVE)  237 mL Oral TID BM  . feeding supplement (PRO-STAT SUGAR FREE 64)  30 mL Oral BID  . furosemide  20 mg Intravenous Q12H  . guaiFENesin  600 mg Oral BID  . insulin aspart  0-9 Units Subcutaneous Q4H  . latanoprost  1 drop Both Eyes QHS  . magnesium oxide  400 mg Oral BID  . midodrine  10 mg Oral BID WC  . multivitamin with minerals  1 tablet Oral Daily  . pantoprazole (PROTONIX) IV  40 mg Intravenous Q12H  . potassium chloride  40 mEq Oral Daily  . sodium chloride flush  3 mL Intravenous Q12H  . sodium chloride flush  3 mL Intravenous Q12H   Continuous Infusions: . sodium chloride    . azithromycin 500 mg (02/18/20 0029)  . ceFEPime (MAXIPIME) IV 2 g (02/18/20 1518)  . heparin 1,000 Units/hr (02/18/20 0852)     LOS: 3 days        Kathlen Mody, MD [facility]   To contact the attending provider between 7A-7P or the covering provider during after hours 7P-7A, please log into the web site [email or web address] and access using universal [place] password for that  web site. If you do not have the password, please call the hospital operator.  02/18/2020, 4:15 PM

## 2020-02-18 NOTE — Progress Notes (Addendum)
ANTICOAGULATION CONSULT NOTE   Pharmacy Consult for Heparin Indication: pulmonary embolus  No Known Allergies  Patient Measurements: Height: 5\' 2"  (157.5 cm) Weight: 114 lb 14.4 oz (52.1 kg) IBW/kg (Calculated) : 54.6 Heparin Dosing Weight: 52 kg  Vital Signs: Temp: 98.3 F (36.8 C) (02/24 1201) Temp Source: Oral (02/24 1201) BP: 107/58 (02/24 1201) Pulse Rate: 91 (02/24 1201)  Labs: Recent Labs    02/15/20 1845 02/16/20 0541 02/16/20 1019 02/16/20 1147 02/17/20 0715 02/17/20 1646 02/17/20 2301 02/18/20 0505 02/18/20 0715 02/18/20 1621  HGB  --   --  8.6*   < > 8.5*  --   --  8.3*  --   --   HCT  --   --  25.4*  --  25.8*  --   --  24.4*  --   --   PLT  --   --  248  --  214  --   --  227  --   --   APTT  --   --   --   --   --   --  32  --  37* 31  LABPROT 20.3*  --   --   --   --   --   --   --   --   --   INR 1.7*  --   --   --   --   --   --   --   --   --   HEPARINUNFRC  --   --   --   --   --   --  0.14*  --  0.11*  --   CREATININE  --    < >  --    < > 0.91 1.04  --  1.00  --   --   TROPONINIHS 7  --   --   --   --   --   --   --   --   --    < > = values in this interval not displayed.    Estimated Creatinine Clearance: 44.9 mL/min (by C-G formula based on SCr of 1 mg/dL).   Assessment: 79 yr old male on apixaban at home due to recent subacute PE in February. Apixaban held on admit with GIB; pharmacy was asked to start IV heparin. Last dose of apixaban was 2/21.  Heparin level and aPTT earlier today appear to be correlating, but hard to assess with subtherapeutic levels.  aPTT ~7.5 hrs after heparin infusion was increased to 1000 units/hr was 31 sec, which remains below the goal range for this patient. CBC stable. When RN checked pt's IV to see if it was running well, she discovered that the pt's heparin IV had infiltrated (IV had been working well around 12 noon today). Heparin infusion is being moved to another IV at this time. No bleeding observed, per  RN.  Goal of Therapy:  Heparin level 0.3-0.5 units/ml aPTT 66-85 seconds Monitor platelets by anticoagulation protocol: Yes   Plan:  Due to infiltration of current IV line, move heparin infusion to alternate site and continue heparin infusion at 1000 units/hr Check aPTT in 8 hrs Monitor daily heparin level and aPTT - can likely dose via heparin levels tomorrow Monitor daily CBC Monitor for signs/symptoms of bleeding  Gillermina Hu, PharmD, BCPS, [facility] Clinical Pharmacist 02/18/2020  Addendum: RN reported at ~18:20 PM that pt has no IV site for the heparin infusion; IV team came to put in new  IV line. RN reported that heparin infusion was restarted at 19:00 PM.

## 2020-02-18 NOTE — Progress Notes (Signed)
Patient concerned re:  Not having had stool in 3 days and feels "it is time".  States would like stool softener.

## 2020-02-18 NOTE — Evaluation (Signed)
Clinical/Bedside Swallow Evaluation Patient Details  Name: Bruce Webb MRN: [id number] Date of Birth: May 12, 1941  Today's Date: 02/18/2020 Time: SLP Start Time (ACUTE ONLY): 0940 SLP Stop Time (ACUTE ONLY): 1002 SLP Time Calculation (min) (ACUTE ONLY): 22 min  Past Medical History:  Past Medical History:  Diagnosis Date  . Hypertension    Past Surgical History: History reviewed. No pertinent surgical history. HPI:  Pt is a 79 y.o. 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 Chest x-ray of 2/22: asymmetric airspace disease, right greater than left. WBC increased on 2/24; afebrile.    Assessment / Plan / Recommendation Clinical Impression  Pt was seen for bedside swallow evaluation. Gujarati interpreter (ID: [phone]) was used for interpretation and translation; however, pt also communicated in English during the session. Marge, RN indicated that the pt tolerated breakfast without difficulty but does not like cold water. Pt reported that cold food/drink makes him cough and he therefore avoids cold drinks and only eats warm foods. Per pt he has been symptomatic for the past two months. Oral mechanism exam was limited due to pt's difficulty hearing the translated commands; however, oral motor strength and ROM appeared grossly WFL. He exhibited coughing with cold thin liquids but tolerated room temperature liquids without overt s/sx of aspiration even when challenged with over eight consecutive swallows. All solids were tolerated without signs or symptoms of oropharyngeal dysphagia. It is recommended that the pt continue the current diet with avoidance of solids and liquids that are cold. Further skilled SLP services are not clinically  indicated at this time for swallowing.  SLP Visit Diagnosis: Dysphagia, unspecified (R13.10)    Aspiration Risk  No limitations    Diet Recommendation Regular;Thin liquid   Liquid Administration via: Straw;Cup Medication Administration: Whole meds with liquid Supervision: Patient able to self feed Postural Changes: Seated upright at 90 degrees    Other  Recommendations Oral Care Recommendations: Oral care BID   Follow up Recommendations None      Frequency and Duration            Prognosis        Swallow Study   General Date of Onset: 02/17/20 HPI: Pt is a 79 y.o. 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 Chest x-ray of 2/22: asymmetric airspace disease, right greater than left. WBC increased on 2/24; afebrile.  Type of Study: Bedside Swallow Evaluation Previous Swallow Assessment: None Diet Prior to this Study: Regular;Thin liquids Temperature Spikes Noted: No Respiratory Status: Nasal cannula History of Recent Intubation: No Behavior/Cognition: Alert;Cooperative;Pleasant mood Oral Cavity Assessment: Within Functional Limits Oral Care Completed by SLP: Recent completion by staff Oral Cavity - Dentition: Adequate natural dentition Vision: Functional for self-feeding Self-Feeding Abilities: Able to feed self Patient Positioning: Upright in bed;Postural control adequate for testing Baseline Vocal Quality: Normal Volitional Cough: Strong Volitional Swallow: Able to elicit    Oral/Motor/Sensory Function Overall Oral Motor/Sensory Function: Within functional limits   Ice Chips Ice chips: Within functional limits Presentation: Spoon   Thin Liquid Thin Liquid: Within functional limits Presentation:  Straw;Cup Other Comments: (  Cough with cold water only)    Nectar Thick Nectar Thick Liquid: Not tested   Honey Thick Honey Thick Liquid: Not tested   Puree Puree: Within functional limits Presentation: Spoon   Solid     Solid: Within functional limits Presentation: [place] [address] [place], [place], [place] Office number [phone] Pager [phone]  Horton Marshall 02/18/2020,10:16 AM

## 2020-02-18 NOTE — Progress Notes (Signed)
ANTICOAGULATION CONSULT NOTE   Pharmacy Consult for heparin Indication: pulmonary embolus  No Known Allergies  Patient Measurements: Height: 5\' 2"  (157.5 cm) Weight: 114 lb 14.4 oz (52.1 kg) IBW/kg (Calculated) : 54.6 Heparin Dosing Weight: 52kg  Vital Signs: Temp: 98.3 F (36.8 C) (02/24 0415) Temp Source: Oral (02/24 0415) BP: 92/53 (02/24 0415) Pulse Rate: 93 (02/24 0415)  Labs: Recent Labs     0000 02/15/20 1640 02/15/20 1845 02/16/20 0541 02/16/20 1019 02/16/20 1147 02/17/20 0715 02/17/20 1646 02/17/20 2301 02/18/20 0505 02/18/20 0715  HGB   < > 6.0*  --   --  8.6*   < > 8.5*  --   --  8.3*  --   HCT   < > 19.0*  --   --  25.4*  --  25.8*  --   --  24.4*  --   PLT   < > 266  --   --  248  --  214  --   --  227  --   APTT  --   --   --   --   --   --   --   --  32  --  37*  LABPROT  --   --  20.3*  --   --   --   --   --   --   --   --   INR  --   --  1.7*  --   --   --   --   --   --   --   --   HEPARINUNFRC  --   --   --   --   --   --   --   --  0.14*  --  0.11*  CREATININE  --  1.11  --    < >  --    < > 0.91 1.04  --  1.00  --   TROPONINIHS  --  12 7  --   --   --   --   --   --   --   --    < > = values in this interval not displayed.    Estimated Creatinine Clearance: 44.9 mL/min (by C-G formula based on SCr of 1 mg/dL).   Assessment: 85 yoM on apixaban at home due to recent subacute PE in February. Apixaban held on admit with GIB, pharmacy now asked to start IV heparin. Last dose of apixaban was 2/21.  Repeat heparin level and aPTT both remain low, appear to be correlating but hard to assess with subtherapeutic levels. CBC stable, no bleeding issues documented.  Goal of Therapy:  Heparin level 0.3-0.5 units/ml aPTT 66-85 seconds Monitor platelets by anticoagulation protocol: Yes   Plan:  -Increase heparin to 1000 units/h -Recheck aPTT in 8hr -Daily heparin level and aPTT - can likely dose via heparin levels tomorrow    3/21,  PharmD, BCPS Clinical Pharmacist [phone] Please check AMION for all [facility] Pharmacy numbers 02/18/2020

## 2020-02-18 NOTE — Evaluation (Signed)
Physical Therapy Evaluation Patient Details Name: Bruce Webb MRN: [id number] DOB: 06/19/1941 Today's Date: 02/18/2020   History of Present Illness  79 y.o. gentleman prior history of essential hypertension, type 2 diabetes mellitus, recent COVID-19 illness initially diagnosed on 01/16/2020. Patient was discharged from hospital on recent discharge from the hospital on 01/31/2020 on 2L of nasal cannula oxygen and had been found to have pulmonary embolism. 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 He presented to the emergency department with exertional dyspnea, melena, and progressive vision loss involving the right eye. Chest x-ray of 2/22: asymmetric airspace disease, right greater than left. WBC increased on 2/24; afebrile.    Clinical Impression  Tylin Force is 79 y.o. male admitted with above HPI and diagnosis. Patient is currently limited by functional impairments below (see PT problem list). Patient lives with his daughter, son-in-law, and wife. Per family and patient he has been limited to short ambulation from bed to bathroom over last 2 months and has been mostly non-ambulatory. Patient will benefit from continued skilled PT interventions to address impairments and progress independence with mobility, recommending 24/7 assist with HHPT if family can provide; if unable to provide may need to consider SNF recommendation. Acute PT will follow and progress as able.        Follow Up Recommendations Home health PT;Supervision/Assistance - 24 hour;SNF    Equipment Recommendations  (TBD)    Recommendations for Other Services       Precautions / Restrictions Precautions Precautions: Fall Restrictions Weight Bearing Restrictions: No      Mobility  Bed Mobility Overal bed mobility: Needs Assistance Bed Mobility: Supine to Sit;Sit to Supine     Supine to sit: Supervision;HOB elevated Sit to supine: Min assist   General bed mobility comments: pt able to sit  up with assist for line management. increased assist required to return to supine due to fatigue and SOB.  Transfers Overall transfer level: Needs assistance Equipment used: Rolling walker (2 wheeled) Transfers: Sit to/from Stand Sit to Stand: Min assist         General transfer comment: cues for technique with RW however pt useing bil UE's on RW to perform power up. pt required min assist to rise and steady. Pt desaturating to <80% on 6L and increased to 7L which borught SpO2 to 84% and then pt continued to drop. further mobility deferred and pt returned to supine.  Ambulation/Gait       Stairs       Wheelchair Mobility    Modified Rankin (Stroke Patients Only)       Balance Overall balance assessment: Needs assistance   Sitting balance-Leahy Scale: Fair              Pertinent Vitals/Pain Pain Assessment: Faces Faces Pain Scale: No hurt Pain Intervention(s): Monitored during session    Home Living Family/patient expects to be discharged to:: Private residence Living Arrangements: Spouse/significant other;Children Available Help at Discharge: Family Type of Home: House         Home Equipment: [place] Mor - single point Additional Comments: 5-10 stairs, unclear if these are in the home or stairs to the enterance    Prior Function Level of Independence: Independent with assistive device(s)         Comments: pt stays in basement apartment and has a bathroom attached to the bedroom. He denies using RW and per chart review used SPC occasionally. Pt's daughter and pt reports he has not been walking for ~1.5-2 months  but also state he had been gettign the the attached bathroom but has not had a BM in almost a week.     Hand Dominance        Extremity/Trunk Assessment   Upper Extremity Assessment Upper Extremity Assessment: Generalized weakness    Lower Extremity Assessment Lower Extremity Assessment: Generalized weakness    Cervical / Trunk  Assessment Cervical / Trunk Assessment: Normal  Communication   Communication: Prefers language other than [place];Interpreter utilized(audio interpreter utilized on tele/video call tablet then transitioned to using room phone to call interpreter as videocall was needed in abother pt's room per RN staff)  Cognition Arousal/Alertness: Awake/alert Behavior During Therapy: WFL for tasks assessed/performed Overall Cognitive Status: Difficult to assess          General Comments: difficult to assess via interpreter and pt's daughter assisting to communicate. pt follows commands with some extra time and appears to have good safety awareness and is aware of deficits.      General Comments      Exercises     Assessment/Plan    PT Assessment Patient needs continued PT services  PT Problem List Decreased activity tolerance;Decreased balance;Decreased mobility;Decreased coordination;Decreased knowledge of use of DME;Decreased safety awareness       PT Treatment Interventions Gait training;Stair training;Functional mobility training;Therapeutic activities;Therapeutic exercise;Balance training;Neuromuscular re-education;Patient/family education;DME instruction    PT Goals (Current goals can be found in the Care Plan section)  Acute Rehab PT Goals Patient Stated Goal: Go home to family PT Goal Formulation: With patient/family Time For Goal Achievement: 03/03/20 Potential to Achieve Goals: Fair    Frequency Min 3X/week    AM-PAC PT "6 Clicks" Mobility  Outcome Measure Help needed turning from your back to your side while in a flat bed without using bedrails?: A Little Help needed moving from lying on your back to sitting on the side of a flat bed without using bedrails?: A Little Help needed moving to and from a bed to a chair (including a wheelchair)?: A Little Help needed standing up from a chair using your arms (e.g., wheelchair or bedside chair)?: A Little Help needed to walk in  hospital room?: A Lot Help needed climbing 3-5 steps with a railing? : A Lot 6 Click Score: 16    End of Session Equipment Utilized During Treatment: Oxygen Activity Tolerance: Patient limited by fatigue;Treatment limited secondary to medical complications (Comment)(pt desaturated) Patient left: in bed;with nursing/sitter in room;with family/visitor present;with bed alarm set;with call bell/phone within reach Nurse Communication: Mobility status(RN notified of pt desaturating with mobility) PT Visit Diagnosis: Unsteadiness on feet (R26.81);Other abnormalities of gait and mobility (R26.89)    Time: 2841-3244 PT Time Calculation (min) (ACUTE ONLY): 36 min   Charges:   PT Evaluation $PT Eval Low Complexity: 1 Low PT Treatments $Therapeutic Activity: 8-22 mins        Verner Mould, DPT Physical Therapist with [facility] [phone]  02/18/2020 5:39 PM

## 2020-02-19 ENCOUNTER — Inpatient Hospital Stay (HOSPITAL_COMMUNITY): Payer: Medicare Other

## 2020-02-19 DIAGNOSIS — I9589 Other hypotension: Secondary | ICD-10-CM

## 2020-02-19 DIAGNOSIS — E861 Hypovolemia: Secondary | ICD-10-CM

## 2020-02-19 DIAGNOSIS — R29898 Other symptoms and signs involving the musculoskeletal system: Secondary | ICD-10-CM

## 2020-02-19 DIAGNOSIS — L8990 Pressure ulcer of unspecified site, unspecified stage: Secondary | ICD-10-CM | POA: Insufficient documentation

## 2020-02-19 LAB — CBC
HCT: 25 % — ABNORMAL LOW (ref 39.0–52.0)
Hemoglobin: 8.2 g/dL — ABNORMAL LOW (ref 13.0–17.0)
MCH: 28.2 pg (ref 26.0–34.0)
MCHC: 32.8 g/dL (ref 30.0–36.0)
MCV: 85.9 fL (ref 80.0–100.0)
Platelets: 249 K/uL (ref 150–400)
RBC: 2.91 MIL/uL — ABNORMAL LOW (ref 4.22–5.81)
RDW: 15.7 % — ABNORMAL HIGH (ref 11.5–15.5)
WBC: 26.2 K/uL — ABNORMAL HIGH (ref 4.0–10.5)
nRBC: 0 % (ref 0.0–0.2)

## 2020-02-19 LAB — APTT: aPTT: 77 seconds — ABNORMAL HIGH (ref 24–36)

## 2020-02-19 LAB — BASIC METABOLIC PANEL
Anion gap: 9 (ref 5–15)
BUN: 28 mg/dL — ABNORMAL HIGH (ref 8–23)
CO2: 27 mmol/L (ref 22–32)
Calcium: 7.7 mg/dL — ABNORMAL LOW (ref 8.9–10.3)
Chloride: 99 mmol/L (ref 98–111)
Creatinine, Ser: 1.07 mg/dL (ref 0.61–1.24)
GFR calc Af Amer: >60 mL/min (ref >60)
GFR calc non Af Amer: >60 mL/min (ref >60)
Glucose, Bld: 117 mg/dL — ABNORMAL HIGH (ref 70–99)
Potassium: 4.1 mmol/L (ref 3.5–5.1)
Sodium: 135 mmol/L (ref 135–145)

## 2020-02-19 LAB — BRAIN NATRIURETIC PEPTIDE: B Natriuretic Peptide: 86.3 pg/mL (ref 0.0–100.0)

## 2020-02-19 LAB — GLUCOSE, CAPILLARY
Glucose-Capillary: 100 mg/dL — ABNORMAL HIGH (ref 70–99)
Glucose-Capillary: 137 mg/dL — ABNORMAL HIGH (ref 70–99)
Glucose-Capillary: 162 mg/dL — ABNORMAL HIGH (ref 70–99)
Glucose-Capillary: 166 mg/dL — ABNORMAL HIGH (ref 70–99)
Glucose-Capillary: 187 mg/dL — ABNORMAL HIGH (ref 70–99)
Glucose-Capillary: 224 mg/dL — ABNORMAL HIGH (ref 70–99)
Glucose-Capillary: 239 mg/dL — ABNORMAL HIGH (ref 70–99)

## 2020-02-19 LAB — HEPARIN LEVEL (UNFRACTIONATED): Heparin Unfractionated: 0.35 IU/mL (ref 0.30–0.70)

## 2020-02-19 LAB — LACTIC ACID, PLASMA
Lactic Acid, Venous: 2.4 mmol/L (ref 0.5–1.9)
Lactic Acid, Venous: 1.9 mmol/L (ref 0.5–1.9)

## 2020-02-19 LAB — CORTISOL: Cortisol, Plasma: 18.2 ug/dL

## 2020-02-19 LAB — URINALYSIS, ROUTINE W REFLEX MICROSCOPIC
Bilirubin Urine: NEGATIVE
Glucose, UA: NEGATIVE mg/dL
Hgb urine dipstick: NEGATIVE
Ketones, ur: NEGATIVE mg/dL
Leukocytes,Ua: NEGATIVE
Nitrite: NEGATIVE
Protein, ur: NEGATIVE mg/dL
Specific Gravity, Urine: 1.006 (ref 1.005–1.030)
pH: 7 (ref 5.0–8.0)

## 2020-02-19 LAB — HEPATIC FUNCTION PANEL
ALT: 169 U/L — ABNORMAL HIGH (ref 0–44)
AST: 189 U/L — ABNORMAL HIGH (ref 15–41)
Albumin: 1.3 g/dL — ABNORMAL LOW (ref 3.5–5.0)
Alkaline Phosphatase: 147 U/L — ABNORMAL HIGH (ref 38–126)
Bilirubin, Direct: 0.1 mg/dL (ref 0.0–0.2)
Indirect Bilirubin: 0.7 mg/dL (ref 0.3–0.9)
Total Bilirubin: 0.8 mg/dL (ref 0.3–1.2)
Total Protein: 5.1 g/dL — ABNORMAL LOW (ref 6.5–8.1)

## 2020-02-19 LAB — PROCALCITONIN: Procalcitonin: 0.99 ng/mL

## 2020-02-19 LAB — PROTIME-INR
INR: 1.2 (ref 0.8–1.2)
Prothrombin Time: 15.1 seconds (ref 11.4–15.2)

## 2020-02-19 MED ORDER — POLYETHYLENE GLYCOL 3350 17 G PO PACK
17.0000 g | PACK | Freq: Every day | ORAL | Status: DC
  Administered 2020-02-19 – 2020-02-23 (×4): 17 g via ORAL
  Filled 2020-02-19 (×4): qty 1

## 2020-02-19 MED ORDER — MIDODRINE HCL 5 MG PO TABS
10.0000 mg | ORAL_TABLET | Freq: Once | ORAL | Status: AC
  Administered 2020-02-19: 10 mg via ORAL
  Filled 2020-02-19: qty 2

## 2020-02-19 MED ORDER — SODIUM CHLORIDE 0.9 % IV BOLUS: 250.0000 mL | Freq: Once | INTRAVENOUS | Status: DC

## 2020-02-19 MED ORDER — WHITE PETROLATUM EX GEL: Freq: Two times a day (BID) | CUTANEOUS | Status: DC

## 2020-02-19 MED ORDER — BUDESONIDE 0.5 MG/2ML IN SUSP
0.5000 mg | Freq: Two times a day (BID) | RESPIRATORY_TRACT | Status: DC
  Administered 2020-02-19 – 2020-02-28 (×17): 0.5 mg via RESPIRATORY_TRACT
  Filled 2020-02-19 (×18): qty 2

## 2020-02-19 MED ORDER — WHITE PETROLATUM GEL
Freq: Two times a day (BID) | Status: DC
  Administered 2020-02-19 – 2020-03-15 (×41): 0.2 via TOPICAL
  Filled 2020-02-19: qty 71
  Filled 2020-02-19 (×8): qty 28.35

## 2020-02-19 MED ORDER — SODIUM CHLORIDE 0.9 % IV SOLN
2.0000 g | INTRAVENOUS | Status: AC
  Administered 2020-02-19 – 2020-02-20 (×2): 2 g via INTRAVENOUS
  Filled 2020-02-19 (×2): qty 2

## 2020-02-19 MED ORDER — IPRATROPIUM-ALBUTEROL 0.5-2.5 (3) MG/3ML IN SOLN
3.0000 mL | RESPIRATORY_TRACT | Status: AC
  Administered 2020-02-19 – 2020-02-20 (×5): 3 mL via RESPIRATORY_TRACT
  Filled 2020-02-19 (×5): qty 3

## 2020-02-19 MED ORDER — SODIUM CHLORIDE 0.9 % IV SOLN
2.0000 g | INTRAVENOUS | Status: DC
  Filled 2020-02-19: qty 2

## 2020-02-19 MED ORDER — SENNOSIDES-DOCUSATE SODIUM 8.6-50 MG PO TABS
2.0000 | ORAL_TABLET | Freq: Two times a day (BID) | ORAL | Status: DC
  Administered 2020-02-19 – 2020-02-23 (×8): 2 via ORAL
  Filled 2020-02-19 (×10): qty 2

## 2020-02-19 MED ORDER — WHITE PETROLATUM EX OINT
TOPICAL_OINTMENT | Freq: Two times a day (BID) | CUTANEOUS | Status: DC
  Filled 2020-02-19 (×2): qty 5

## 2020-02-19 MED ORDER — SODIUM CHLORIDE 0.9 % IV BOLUS
500.0000 mL | Freq: Once | INTRAVENOUS | Status: AC
  Administered 2020-02-19: 12:00:00 500 mL via INTRAVENOUS

## 2020-02-19 MED ORDER — SODIUM CHLORIDE 0.9 % IV BOLUS: 500.0000 mL | Freq: Once | INTRAVENOUS | Status: DC

## 2020-02-19 NOTE — Consult Note (Addendum)
NAME:  Bruce Webb, MRN:  [id number], DOB:  1941-12-12, LOS: 4 ADMISSION DATE:  02/15/2020, CONSULTATION DATE:  02/19/2020 REFERRING MD:  Dr. Blake Divine, CHIEF COMPLAINT:  Hypotension/ hypoxia    Brief History   79 year old male post COVID and PE in January on Eliquis admitted 2/21 to [facility] for extertional dyspnea found to be anemic with hemoccult positive stools.  GI consulted and transfused PRBC with since stabilized Hgb but has had persistent higher O2 requirements.  Was diuresed but today developed hypotension, with ongoing higher O2 requirements, PCCM was asked to consult.     History of present illness   HPI obtained via interpreter and from daughter.   79 year old male, who speaks [place], with history of HTN, HFpEF, DMT2, glaucoma with recent COVID 01/16/2020 with subsequent subacute PE on Eliquis and home O2 2L [place] who presented 2/21 with exertional dyspnea, black tarry stools, and worsening vision loss in his right eye.  Patient lives at home with his daughter and pre-COVID was independent in his ADLs.  He is a retired and prior worked in an office.  Denies any smoking or pulmonary history.  Reports prior to COVID, had a chronic non-productive cough for 10-15 years and his primary care MD treated it occasionally with cough medicine.   Patient with recent hospitalization at [facility] for COVID after initial positive 1/22 for failed outpatient treatment for COVID infection despite monoclonal antibodies and steroids.  While at [facility], treated with remdesivir and decadron and found to have a PE and was discharged home on 2/6 on 2L O2 and Eliquis.   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    On admit, found to have worsening anemia, 8.8-> 6 with positive fecal occult blood testing and hypotension responsive to fluid.  Additionally found to have WBC 28k, CXR with worsening multifocal airspace consolidation bilaterally.  He was started  empirically on zithromax and cefepime. PCT 0.5.  He was transfused with 2 units PRBC, started on protonix, and GI consulted. However, given tenuous respiratory status and increased O2 needs, GI deferred EGD.  Hgb trend since stable and therefore placed on heparin gtt on 2/23.  He was diuresed on 2/23 given fluid overload post blood transfusion with good output then but has since required midodrine to help with soft blood pressures.  On 2/25, he had worsening hypotension despite midodrine, ongoing elevated O2 needs, and transaminitis, PCCM asked to consult.     Past Medical History  HTN, HFpEF, DMT2, glaucoma with progressive vision loss (left, now rapid loss in right) - recent COVID 01/16/2020 with subsequent PE on Eliquis and O2 2L [place]  Significant Hospital Events   2/21 admit  Consults:  GI  PCCM  Procedures:   Significant Diagnostic Tests:  2/21 CTH >> 1. No acute intracranial abnormalities. 2. Mild cerebral atrophy with chronic microvascular ischemic changes in the cerebral white matter, as above.  2/23 TTE >> 1. Left ventricular ejection fraction, by estimation, is 60 to 65%. The left ventricle has normal function. The left ventricle has no regional wall motion abnormalities. Left ventricular diastolic parameters are consistent with Grade II diastolic dysfunction (pseudonormalization).  2. The history indicates the patient has a pulmonary embolus. The RV is  not dilated and there is normal RV function. . Right ventricular systolic function is low normal. The right ventricular size is normal.  3. The mitral valve is normal in structure and function. Mild mitral valve regurgitation. No evidence  of mitral stenosis.  4. The aortic valve is normal in structure and function. Aortic valve regurgitation is not visualized. No aortic stenosis is present.   Micro Data:  2/21 BCx 2 >> ngtd x 4 days  Antimicrobials:  2/21 azithro>> 2/21 ceftriaxone  2/22 cefepime >>  Interim  history/subjective:  Currently on VM 45% due to patient preference, more comfortable off [place] and is mouth breather.    Currently without any complaints of pain or SOB.  Reports ongoing exertional dyspnea and desaturation and dry NP cough  Objective   Blood pressure (!) 97/56, pulse 78, temperature 97.7 F (36.5 C), temperature source Oral, resp. rate 18, height 5\' 2"  (1.575 m), weight 49.6 kg, SpO2 93 %.    FiO2 (%):  [45 %] 45 %   Intake/Output Summary (Last 24 hours) at 02/19/2020 1436 Last data filed at 02/19/2020 1114 Gross per 24 hour  Intake 761.39 ml  Output 3150 ml  Net -2388.61 ml   Filed Weights   02/17/20 0441 02/18/20 0415 02/19/20 0659  Weight: 52.6 kg 52.1 kg 49.6 kg    Examination: General:  Elderly thin cachetic male lying in bed in NAD HEENT: MM pink/moist, pupils 4/reactive, anicteric  Neuro: Awake, oriented, MAE CV: rr, no murmur PULM:  Non labored, clear anteriorly, bilateral rales GI: soft, NT/ ND, +bs Extremities: warm/dry, no LE edema, poor muscle mass  Skin: no rashes   Resolved Hospital Problem list    Assessment & Plan:   Hypoxic respiratory failure  Multifocal PNA post COVID Recent PE on home Eliquis  Chronic cough  - passed SLP with no restrictions P:  Continue to wean supplemental O2 for sat goal > 88-95- PT CURRENTLY PREFERS FACEMASK due to prior nasal discomfort.  Aggressive pulm hygiene- IS, Flutter valve, push PT efforts with expected brief oxygen desaturation, mucinex  Check BNP  Can not rule out underlying ILD process given hx of chronic dry cough for years, not thought to be related to lisinopril (he has only been on it for two years).   Ideally, a bronch would be helpful but he is currently too high risk.  Will check HRCT, ANA, CCP, HSP panel  Need to consider steroids, but will await HRCT duonebs q 4 x 24 then prn  Start pulmicort nebs     Leukocytosis without fever -PCT 0.66- > 0.50 - UA neg P: Repeat PCT, likely would stop  abx for completion of 5 days  Follow cultures    Transient hypotension- since improved with fluid  Hx HTN - I/O- net negative 2.2L  P:  Ok to remain in PCU for now Continue to monitor, would try additional fluid bolus if recurrent (preferably after HRCT if able) Looks dry clinically after diuresis, responded well to NS 500 ml bolus earlier Midodrine per primary Goal MAP > 65 Strict I/Os No significant RV dysfunction on TTE, normal EF, G2DD noted.    GI bleed ABLA  P:  Stable H/H, no further significant bleeding episodes on heparin (restarted 2/23) Seen by GI, EGD deferred given tenuous respiratory status.   Transaminitis - unclear etiology, ? Related to hypotension vs HFpEF, no hx of N/V, abd discomfort P:  Trend LFTs    Severe protein calorie malnutrition P:  Nutrition per RD recs to help maximize, patient is vegan   End stage Glaucoma P:  Per primary   Remainder per primary.  PCCM will continue to follow.   Best practice:  Diet: per RD recs Pain/Anxiety/Delirium protocol (if indicated):  n/a VAP protocol (if indicated): n/a DVT prophylaxis: heparin gtt GI prophylaxis: PPI Glucose control: SSI Mobility: push  Code Status: Full  Family Communication: Patient and daughter updated at bedside Disposition: 3E/ PCU   Labs   CBC: Recent Labs  Lab 02/15/20 1640 02/16/20 1019 02/17/20 0715 02/18/20 0505 02/19/20 0315  WBC 28.1* 24.6* 24.9* 29.4* 26.2*  NEUTROABS 23.2*  --   --   --   --   HGB 6.0* 8.6* 8.5* 8.3* 8.2*  HCT 19.0* 25.4* 25.8* 24.4* 25.0*  MCV 87.2 83.0 84.0 82.7 85.9  PLT 266 248 214 227 249    Basic Metabolic Panel: Recent Labs  Lab 02/16/20 1147 02/17/20 0715 02/17/20 1646 02/18/20 0505 02/19/20 0315  NA 134* 133* 131* 131* 135  K 3.9 3.6 3.3* 3.0* 4.1  CL 105 102 97* 98 99  CO2 19* 20* 23 25 27   GLUCOSE 125* 141* 256* 154* 117*  BUN 16 14 13  26* 28*  CREATININE 0.97 0.91 1.04 1.00 1.07  CALCIUM 7.4* 7.3* 7.6* 7.3* 7.7*  MG   --   --   --  1.7  --    GFR: Estimated Creatinine Clearance: 39.9 mL/min (by C-G formula based on SCr of 1.07 mg/dL). Recent Labs  Lab 02/15/20 1640 02/15/20 1845 02/16/20 0541 02/16/20 1019 02/17/20 0715 02/18/20 0505 02/19/20 0315 02/19/20 1202  PROCALCITON  --   --  0.66  --  0.50  --   --   --   WBC   < >  --   --  24.6* 24.9* 29.4* 26.2*  --   LATICACIDVEN  --  1.8  --   --   --   --   --  2.4*   < > = values in this interval not displayed.    Liver Function Tests: Recent Labs  Lab 02/16/20 0541 02/17/20 0715 02/19/20 0315  AST 119* 186* 189*  ALT 120* 158* 169*  ALKPHOS 130* 161* 147*  BILITOT 1.3* 1.1 0.8  PROT 5.0* 4.6* 5.1*  ALBUMIN 1.5* 1.3* 1.3*   No results for input(s): LIPASE, AMYLASE in the last 168 hours. No results for input(s): AMMONIA in the last 168 hours.  ABG No results found for: PHART, PCO2ART, PO2ART, HCO3, TCO2, ACIDBASEDEF, O2SAT   Coagulation Profile: Recent Labs  Lab 02/15/20 1845  INR 1.7*    Cardiac Enzymes: No results for input(s): CKTOTAL, CKMB, CKMBINDEX, TROPONINI in the last 168 hours.  HbA1C: Hgb A1c MFr Bld  Date/Time Value Ref Range Status  02/16/2020 05:41 AM 7.1 (H) 4.8 - 5.6 % Final    Comment:    (NOTE) Pre diabetes:          5.7%-6.4% Diabetes:              >6.4% Glycemic control for   <7.0% adults with diabetes     CBG: Recent Labs  Lab 02/18/20 1607 02/18/20 2100 02/19/20 0028 02/19/20 0459 02/19/20 0759  GLUCAP 162* 187* 162* 100* 137*    Review of Systems:   POSITIVES IN BOLD Gen: Denies fever, chills, fatigue, night sweats HEENT: Denies blurred vision, double vision, hearing loss, tinnitus, sinus congestion, rhinorrhea, sore throat, neck stiffness, dysphagia PULM: Denies exertional shortness of breath, non productive cough, sputum production, hemoptysis, wheezing CV: Denies chest pain, edema, orthopnea, palpitations GI: Denies abdominal pain, nausea, vomiting, diarrhea, hematochezia,  melena, constipation, change in bowel habits, dark stools GU: Denies dysuria, hematuria, polyuria, oliguria, urethral discharge Derm: Denies rash, skin change Heme: Denies  easy bruising, bleeding Neuro: Denies headache, numbness, weakness, slurred speech, loss of memory or consciousness  Past Medical History  He,  has a past medical history of Hypertension.   Surgical History   History reviewed. No pertinent surgical history.   Social History   reports that he has never smoked. He has never used smokeless tobacco.   Family History   His family history is negative for Diabetes.   Allergies No Known Allergies   Home Medications  Prior to Admission medications   Medication Sig Start Date End Date Taking? Authorizing Provider  acetaminophen (TYLENOL) 325 MG tablet Take 650 mg by mouth every 6 (six) hours as needed for mild pain or fever.   Yes [provider]  apixaban (ELIQUIS) 5 MG TABS tablet Take 5 mg by mouth 2 (two) times daily.   Yes [provider]  Brinzolamide-Brimonidine (SIMBRINZA) 1-0.2 % SUSP Place 1 drop into both eyes daily.   Yes [provider]  Carboxymethylcellulose Sod PF 0.5 % SOLN Place 1 drop into both eyes at bedtime.   Yes [provider]  ferrous sulfate 325 (65 FE) MG tablet Take 1 tablet (325 mg total) by mouth 2 (two) times daily with a meal. 01/31/20  Yes Amin, Ankit Chirag, MD  Ipratropium-Albuterol (COMBIVENT) 20-100 MCG/ACT AERS respimat Inhale 1 puff into the lungs every 6 (six) hours as needed for wheezing. 01/31/20  Yes Amin, Ankit Chirag, MD  latanoprost (XALATAN) 0.005 % ophthalmic solution Place 1 drop into both eyes at bedtime.   Yes [provider]  lisinopril (ZESTRIL) 10 MG tablet Take 10-20 mg by mouth See admin instructions. Take 20mg  tablet by mouth in am & then take 10mg  tablet by mouth in evening 11/25/19  Yes [provider]  senna-docusate (SENOKOT-S) 8.6-50 MG tablet Take 2 tablets by  mouth at bedtime as needed for mild constipation or moderate constipation. 01/31/20  Yes Amin, Ankit Chirag, MD  Timolol-Brimonidine-Dorzolamid 0.5-0.15-2 % SOLN Place 1 drop into both eyes daily.   Yes [provider]  apixaban (ELIQUIS) 5 MG TABS tablet Take 2 tablets (10 mg total) by mouth 2 (two) times daily for 5 days, THEN 1 tablet (5 mg total) 2 (two) times daily for 25 days. Patient not taking: Reported on 02/16/2020 01/31/20 03/01/20  03/30/20, MD  polyethylene glycol (MIRALAX / GLYCOLAX) 17 g packet Take 17 g by mouth daily as needed for moderate constipation or severe constipation. Patient not taking: Reported on 02/16/2020 01/31/20   02/18/2020, MD    CCT 50 mins  03/30/20, MSN, AGACNP-BC [facility] 02/19/2020, 5:45 PM

## 2020-02-19 NOTE — Progress Notes (Addendum)
Pt hypotensive, manual BP checked 68/40, MD made aware, gave orders for NS bolus 500cc. Pt has been complaining of lower abd pain,firm/tight. bladder scan done .In and Out cath done, urine output 

## 2020-02-19 NOTE — Consult Note (Signed)
WOC Nurse Consult Note: Patient receiving care in [facility] 3E29. Reason for Consult:  Nose wound from nasal cannula Wound type: Per primary RN, Delorise Shiner, there are pink and black areas as the base of the nose and just inside the nares related to use of a nasal cannula.  The patient has been changed to a face mask for oxygen administration. Pressure Injury POA: No Measurement: To be provided by the bedside RN in the flowsheet section Wound bed: as described Drainage (amount, consistency, odor) none Periwound: intact Dressing procedure/placement/frequency:  Place vaseline to the wound area of the nose and nares. Monitor the wound area(s) for worsening of condition such as: Signs/symptoms of infection,  Increase in size,  Development of or worsening of odor, Development of pain, or increased pain at the affected locations.  Notify the medical team if any of these develop.  Thank you for the consult.  Discussed plan of care with the bedside nurse.  Helmut Muster, RN, MSN, CWOCN, CNS-BC, pager [phone]

## 2020-02-19 NOTE — Progress Notes (Signed)
Patient has output in external catheter bag.  Max bladder scan volume 182.  Will rescan bladder later in shift.    Per pt lack of BM for 5 days. Last documented was 02/17/2020.  Patient given scheduled senokot and this RN offered to page provider to request something additional.  Patient is not interested in doing that tonight but maybe tomorrow. Will inform day RN.

## 2020-02-19 NOTE — Evaluation (Signed)
Occupational Therapy Evaluation Patient Details Name: Bruce Webb MRN: [id number] DOB: 09/28/1941 Today's Date: 02/19/2020    History of Present Illness 79 y.o. gentleman prior history of essential hypertension, type 2 diabetes mellitus, recent COVID-13 illness initially diagnosed on 01/16/2020. Patient was discharged from hospital on recent discharge from the hospital on 01/31/2020 on 2L of nasal cannula oxygen and had been found to have pulmonary embolism. 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 He presented to the emergency department with exertional dyspnea, melena, and progressive vision loss involving the right eye. Chest x-ray of 2/22: asymmetric airspace disease, right greater than left. WBC increased on 2/24; afebrile.   Clinical Impression   Patient sleeping upon arrival, however ease to wake. Patient agreeable to participate in OT session. Patient min guard assist with increased time from supine to sit with initial decrease of O2 to 85% on 9L HFNC. OT provide education for pursed lip breathing techniques with min cues for technique during recovery. Patient O2 increase to 89-90% on 9L and maintained during seated ADL tasks. Deferred transfer at this time due to increased O2 needs and dyspnea with minimal exertion. Recommend continued acute OT services to maximize patient strength, activity tolerance in order to reduce caregiver burden.    Follow Up Recommendations  Home health OT;Supervision/Assistance - 24 hour;SNF;Other (comment)(rehab if family cannot assist at current level at home)    Equipment Recommendations  3 in 1 bedside commode;Other (comment)(if he doesn't already have)       Precautions / Restrictions Precautions Precautions: Fall Precaution Comments: watch O2 Restrictions Weight Bearing Restrictions: No      Mobility Bed Mobility Overal bed mobility: Needs Assistance Bed Mobility: Supine to Sit;Sit to Supine     Supine to sit: Min  guard;HOB elevated Sit to supine: Mod assist   General bed mobility comments: while assisting LEs back onto bed patient lays back quickly instead of laying onto side then rolls onto back. mod A to correct posture  Transfers Overall transfer level: Needs assistance               General transfer comment: deferred functional transfers at this time due to drop in O2 with bed mobility and increased recovery time + PLB, anticipate Ax1 due to sitting balance, bed mobility    Balance Overall balance assessment: Needs assistance Sitting-balance support: No upper extremity supported;Feet supported Sitting balance-Leahy Scale: Fair Sitting balance - Comments: close supervision                                   ADL either performed or assessed with clinical judgement   ADL Overall ADL's : Needs assistance/impaired Eating/Feeding: Independent;Sitting Eating/Feeding Details (indicate cue type and reason): patient able to drink from ensure and eat muffin seated edge of bed Grooming: Wash/dry face;Set up;Sitting Grooming Details (indicate cue type and reason): min cue for thoroughess Upper Body Bathing: Min guard;Sitting   Lower Body Bathing: Minimal assistance;Sitting/lateral leans   Upper Body Dressing : Min guard;Sitting   Lower Body Dressing: Minimal assistance;Sitting/lateral leans   Toilet Transfer: Minimal assistance;BSC Toilet Transfer Details (indicate cue type and reason): simulated with functional mobility Toileting- Clothing Manipulation and Hygiene: Min guard;Sitting/lateral lean;Sit to/from stand       Functional mobility during ADLs: Minimal assistance General ADL Comments: patient requires increased assistance with self care due to significant O2 needs and dyspnea on exertion. Educate patient on pursed lip breathing  prior to engagement of ADLs at EOB due to drop in O2 saturations with bed mobility to 85% on 9L      Vision Baseline Vision/History:  Glaucoma Vision Assessment?: Vision impaired- to be further tested in functional context Additional Comments: patient reports he can only see shapes            Pertinent Vitals/Pain Pain Assessment: Faces Faces Pain Scale: Hurts little more Pain Location: abdomen Pain Descriptors / Indicators: Grimacing Pain Intervention(s): Patient requesting pain meds-RN notified     Hand Dominance Right   Extremity/Trunk Assessment Upper Extremity Assessment Upper Extremity Assessment: Generalized weakness   Lower Extremity Assessment Lower Extremity Assessment: Defer to PT evaluation       Communication Communication Communication: Prefers language other than English   Cognition Arousal/Alertness: Awake/alert Behavior During Therapy: WFL for tasks assessed/performed Overall Cognitive Status: Difficult to assess                                 General Comments: follows simple directions appropriately   General Comments  patient desat with bed mobility to 85% on 9L, cues for PLB and increased recovery time (approx 5 mins) to increase to 89-90% on 9L. able to maintain for remainder of seated ADLs            Home Living Family/patient expects to be discharged to:: Private residence Living Arrangements: Spouse/significant other;Children Available Help at Discharge: Family Type of Home: House             Bathroom Shower/Tub: Engineer, civil (consulting) Toilet: Standard     Home Equipment: Medical laboratory scientific officer - single point          Prior Functioning/Environment Level of Independence: Independent with assistive device(s)        Comments: pt stays in basement apartment and has a bathroom attached to the bedroom. He denies using RW and per chart review used SPC occasionally. Pt's daughter and pt reports he has not been walking for ~1.5-2 months but also state he had been getting to the attached bathroom but has not had a BM in almost a week. (info obtained from PT eval)         OT Problem List: Decreased strength;Decreased activity tolerance;Impaired balance (sitting and/or standing);Cardiopulmonary status limiting activity      OT Treatment/Interventions: Self-care/ADL training;Therapeutic exercise;Energy conservation;Therapeutic activities;Balance training;Patient/family education    OT Goals(Current goals can be found in the care plan section) Acute Rehab OT Goals Patient Stated Goal: Go home to family OT Goal Formulation: With patient Time For Goal Achievement: 03/04/20 Potential to Achieve Goals: Good  OT Frequency: Min 2X/week    AM-PAC OT "6 Clicks" Daily Activity     Outcome Measure Help from another person eating meals?: None Help from another person taking care of personal grooming?: A Little Help from another person toileting, which includes using toliet, bedpan, or urinal?: A Little Help from another person bathing (including washing, rinsing, drying)?: A Little Help from another person to put on and taking off regular upper body clothing?: A Little Help from another person to put on and taking off regular lower body clothing?: A Little 6 Click Score: 19   End of Session Equipment Utilized During Treatment: Oxygen Nurse Communication: Mobility status;Other (comment)(O2 saturations with activity)  Activity Tolerance: Patient tolerated treatment well Patient left: in bed;with call bell/phone within reach;with nursing/sitter in room  OT Visit Diagnosis: Unsteadiness on feet (R26.81);Muscle weakness (  generalized) (M62.81)                Time: 0762-2633 OT Time Calculation (min): 32 min Charges:  OT General Charges $OT Visit: 1 Visit OT Evaluation $OT Eval Moderate Complexity: 1 Mod OT Treatments $Self Care/Home Management : 8-22 mins  Myrtie Neither OT OT office: [phone]  Carmelia Roller 02/19/2020, 1:18 PM

## 2020-02-19 NOTE — Progress Notes (Addendum)
Patient educated on medications received via [place] audio interpreter.  Patient is agreeable and has no questions at this time.

## 2020-02-19 NOTE — Progress Notes (Signed)
PROGRESS NOTE    Bruce Webb  LNL:[id number] DOB: 1941-08-20 DOA: 02/15/2020 PCP: Ignatius Specking, MD  Brief Narrative:  -year-old gentleman prior history of essential hypertension, type 2 diabetes mellitus, recent COVID-19 illness initially diagnosed on 01/16/2020, left eye vision loss, recent discharge from the hospital on 01/31/2020 on 2 L of nasal cannula oxygen, pulmonary embolism presents to ED with worsening shortness of breath and worsening vision loss in his right eye.  Of note patient was seen by Dr. Alden Hipp with ophthalmology on 02/10/2020 and was diagnosed with end-stage glaucoma of both eyes despite maximal treatment with multiple eyedrops. On arrival to ED his chest x-ray was found to have bilateral infiltrates and elevated WBC count of 28,000's.  He was also found to have anemia with a hemoglobin of 6, underwent 2 units of PRBC transfusion.  He was started on IV Protonix for possible blood loss anemia and IV antibiotics for superimposed multifocal pneumonia.  Gastroenterology was consulted and initially was scheduled for endoscopy but in view of his tenuous respiratory status endoscopy was canceled.  Patient is currently on IV heparin and there have been no signs of bleeding or melanotic stools.  Discussed with Dr. Elnoria Howard today since his hemoglobin has been seen stable plan to transition to oral Eliquis and watch for any signs of bleeding.  Outpatient follow-up with gastroenterology for EGD. Earlier this morning patient had an episode of hypotension, responded well with IV fluid bolus.  His lactic acid was slightly elevated at 2.4. IV Lasix was discontinued.  PCCM consulted for persistent hypoxia, elevated lactic acid and hypotension.  Assessment & Plan:   Principal Problem:   Acute GI bleeding Active Problems:   Essential hypertension   Pulmonary embolism (HCC)   Multifocal pneumonia   Hyponatremia   Hyperglycemia   Melena, acute blood loss anemia Suspicious for upper GI bleed.  GI  was consulted and he was initially scheduled to have an upper endoscopy but was canceled as he had worsening oxygen requirement.   Patient underwent  2 units of PRBC transfusion and patient's hemoglobin has been stable around 8 since then.  On further discussion with Dr. Elnoria Howard, recommended to transition IV heparin to oral Eliquis for his pulmonary embolism and watch for signs of bleeding.  Transition IV PPI to oral PPI twice daily.    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 Patient was recently admitted for severe hypoxic respiratory failure from Covid illness and was discharged on 01/31/2020 on 2 L of oxygen. He was readmitted for secondary/superimposed bacterial pneumonia with leukocytosis and elevated procalcitonin. He was started on broad-spectrum IV antibiotics and he is currently requiring up to 6 L of nasal cannula oxygen to keep sats greater than 90%. His BNP was elevated at 234.8 and his echocardiogram showed Left ventricular ejection fraction, by estimation, is 60 to 65%. The left ventricle has no regional wall motion abnormalities. Left ventricular diastolic parameters are consistent with Grade II diastolic  dysfunction (pseudonormalization).  He was started on IV Lasix and has diuresed about 2.2 L in the last 24 hours. His creatinine has been stable around 1 on IV Lasix but he became hypotensive this morning and IV Lasix was discontinued. His lactic acid at 2.4.  Follow pro calcitonin levels. Patient remains afebrile and his WBC count improved from 29.4-26.2. PCCM consulted for persistent hypoxia, leukocytosis, elevated lactic acid levels and hypotension  Elevated liver enzymes Patient denies any nausea or vomiting or abdominal pain, has good appetite. Repeat liver panel probably secondary to congestion from acute diastolic heart failure  vs ? Covid.  Ultrasound abdomen ordered for further evaluation..  Total bilirubin within normal limits, PT/INR ordered for evening labs.    Mild hyponatremia Resolved   Hypokalemia Replaced and repeat level at 4.1    Essential hypertension Blood pressure parameters have been low this morning.    Pulmonary embolism Patient is currently on IV heparin, no signs of bleeding, transition to Eliquis tomorrow if hemoglobin remains stable.   Constipation Senna Colace and MiraLAX ordered.   Acute urinary retention Unclear etiology in and out cath done and 600 mL urine out. Frequent bladder scans and watch urine output.    DVT prophylaxis: Heparin Code Status: Full code Family Communication: Discussed with at bedside.   Disposition Plan:  Patient is from home and still requiring up to 6 L of nasal cannula oxygen and is not stable for discharge yet.  Consultants:   None  Procedures: None Antimicrobials: Cefepime and azithromycin. Subjective: Patient reports some suprapubic discomfort, no nausea or vomiting Breathing the same, no chest pain.  Objective: Vitals:   02/19/20 1150 02/19/20 1200 02/19/20 1215 02/19/20 1230  BP: (!) 92/54 (!) 89/61 (!) 87/47 (!) 82/60  Pulse: 65 67 73   Resp: 20 (!) 24 (!) 27   Temp:      TempSrc:      SpO2: 100% 100% 100%   Weight:      Height:        Intake/Output Summary (Last 24 hours) at 02/19/2020 1250 Last data filed at 02/19/2020 1114 Gross per 24 hour  Intake 930.41 ml  Output 3150 ml  Net -2219.59 ml   Filed Weights   02/17/20 0441 02/18/20 0415 02/19/20 0659  Weight: 52.6 kg 52.1 kg 49.6 kg    Examination:  General exam: Frail, ill-appearing gentleman, pleasant, not in any kind of distress on 6 L of nasal cannula oxygen Respiratory system: Diminished air entry at bases and scattered rhonchi, no wheezing heard Cardiovascular system: S1-S2 heard, regular rate rhythm, no JVD, no pedal edema. Gastrointestinal system:  Abdomen is soft, nontender, nondistended, bowel sounds normal Central nervous system: Alert and oriented, able to move all extremities, follows commands and answering questions appropriately extremities: No cyanosis or clubbing Skin: Nasal bridge skin tear from nasal prongs use Psychiatry: Appears fatigued but mood is appropriate    Data Reviewed: I have personally reviewed following labs and imaging studies  CBC: Recent Labs  Lab 02/15/20 1640 02/16/20 1019 02/17/20 0715 02/18/20 0505 02/19/20 0315  WBC 28.1* 24.6* 24.9* 29.4* 26.2*  NEUTROABS 23.2*  --   --   --   --   HGB 6.0* 8.6* 8.5* 8.3* 8.2*  HCT 19.0* 25.4* 25.8* 24.4* 25.0*  MCV 87.2 83.0 84.0 82.7 85.9  PLT 266 248 214 227 841   Basic Metabolic Panel: Recent Labs  Lab 02/16/20 1147 02/17/20 0715 02/17/20 1646 02/18/20 0505 02/19/20 0315  NA 134* 133* 131* 131* 135  K 3.9 3.6 3.3* 3.0* 4.1  CL 105 102 97* 98 99  CO2 19* 20* 23 25 27   GLUCOSE 125* 141* 256* 154* 117*  BUN 16 14 13  26* 28*  CREATININE 0.97 0.91 1.04 1.00 1.07  CALCIUM 7.4* 7.3* 7.6* 7.3* 7.7*  MG  --   --   --  1.7  --    GFR: Estimated Creatinine Clearance: 39.9 mL/min (by  C-G formula based on SCr of 1.07 mg/dL). Liver Function Tests: Recent Labs  Lab 02/16/20 0541 02/17/20 0715 02/19/20 0315  AST 119* 186* 189*  ALT 120* 158* 169*  ALKPHOS 130* 161* 147*  BILITOT 1.3* 1.1 0.8  PROT 5.0* 4.6* 5.1*  ALBUMIN 1.5* 1.3* 1.3*   No results for input(s): LIPASE, AMYLASE in the last 168 hours. No results for input(s): AMMONIA in the last 168 hours. Coagulation Profile: Recent Labs  Lab 02/15/20 1845  INR 1.7*   Cardiac Enzymes: No results for input(s): CKTOTAL, CKMB, CKMBINDEX, TROPONINI in the last 168 hours. BNP (last 3 results) No results for input(s): PROBNP in the last 8760 hours. HbA1C: No results for input(s): HGBA1C in the last 72 hours. CBG: Recent Labs  Lab 02/18/20 1607 02/18/20 2100 02/19/20 0028 02/19/20 0459  02/19/20 0759  GLUCAP 162* 187* 162* 100* 137*   Lipid Profile: No results for input(s): CHOL, HDL, LDLCALC, TRIG, CHOLHDL, LDLDIRECT in the last 72 hours. Thyroid Function Tests: No results for input(s): TSH, T4TOTAL, FREET4, T3FREE, THYROIDAB in the last 72 hours. Anemia Panel: No results for input(s): VITAMINB12, FOLATE, FERRITIN, TIBC, IRON, RETICCTPCT in the last 72 hours. Sepsis Labs: Recent Labs  Lab 02/15/20 1845 02/16/20 0541 02/17/20 0715  PROCALCITON  --  0.66 0.50  LATICACIDVEN 1.8  --   --     Recent Results (from the past 240 hour(s))  Culture, blood (routine x 2)     Status: None (Preliminary result)   Collection Time: 02/15/20  6:30 PM   Specimen: BLOOD  Result Value Ref Range Status   Specimen Description BLOOD SITE NOT SPECIFIED  Final   Special Requests   Final    BOTTLES DRAWN AEROBIC AND ANAEROBIC Blood Culture adequate volume   Culture   Final    NO GROWTH 3 DAYS Performed at [facility], [address] [place], [place] [address]    Report Status PENDING  Incomplete  Culture, blood (routine x 2)     Status: None (Preliminary result)   Collection Time: 02/15/20  6:45 PM   Specimen: BLOOD  Result Value Ref Range Status   Specimen Description BLOOD SITE NOT SPECIFIED  Final   Special Requests   Final    BOTTLES DRAWN AEROBIC AND ANAEROBIC Blood Culture adequate volume   Culture   Final    NO GROWTH 3 DAYS Performed at [facility], [address] [place], [place] [address]    Report Status PENDING  Incomplete         Radiology Studies: ECHOCARDIOGRAM COMPLETE  Result Date: 02/17/2020    ECHOCARDIOGRAM REPORT   Patient Name:   [facility] Date of Exam: 02/17/2020 Medical Rec #:  [id number]      Height:       62.0 in Accession #:    [id number]     Weight:       116.0 lb Date of Birth:  [birth year]       BSA:          1.516 m Patient Age:    79 years       BP:           100/81 mmHg Patient Gender: M              HR:           90 bpm.  Exam Location:  Inpatient Procedure: 2D Echo Indications:    Dyspnea 786.09 / R06.00  History:  Patient has no prior history of Echocardiogram examinations.                 Risk Factors:Diabetes and Hypertension. Pulmonary embolism                 Acute respiratory failure with hypoxia.  Sonographer:    Leeroy Bock Turrentine Referring Phys: [phone] PREETHA JOSEPH  Sonographer Comments: Image acquisition challenging due to respiratory motion. IMPRESSIONS  1. Left ventricular ejection fraction, by estimation, is 60 to 65%. The left ventricle has normal function. The left ventricle has no regional wall motion abnormalities. Left ventricular diastolic parameters are consistent with Grade II diastolic dysfunction (pseudonormalization).  2. The history indicates the patient has a pulmonary embolus. The RV is not dilated and there is normal RV function. . Right ventricular systolic function is low normal. The right ventricular size is normal.  3. The mitral valve is normal in structure and function. Mild mitral valve regurgitation. No evidence of mitral stenosis.  4. The aortic valve is normal in structure and function. Aortic valve regurgitation is not visualized. No aortic stenosis is present. FINDINGS  Left Ventricle: Left ventricular ejection fraction, by estimation, is 60 to 65%. The left ventricle has normal function. The left ventricle has no regional wall motion abnormalities. The left ventricular internal cavity size was normal in size. There is  no left ventricular hypertrophy. Left ventricular diastolic parameters are consistent with Grade II diastolic dysfunction (pseudonormalization). Right Ventricle: The history indicates the patient has a pulmonary embolus. The RV is not dilated and there is normal RV function. The right ventricular size is normal. No increase in right ventricular wall thickness. Right ventricular systolic function is low normal. Left Atrium: Left atrial size was normal in size. Right  Atrium: Right atrial size was normal in size. Pericardium: There is no evidence of pericardial effusion. Mitral Valve: The mitral valve is normal in structure and function. Mild mitral valve regurgitation. No evidence of mitral valve stenosis. Tricuspid Valve: The tricuspid valve is normal in structure. Tricuspid valve regurgitation is trivial. Aortic Valve: The aortic valve is normal in structure and function. Aortic valve regurgitation is not visualized. No aortic stenosis is present. Aortic valve mean gradient measures 5.0 mmHg. Aortic valve peak gradient measures 8.0 mmHg. Aortic valve area, by VTI measures 2.64 cm. Pulmonic Valve: The pulmonic valve was normal in structure. Pulmonic valve regurgitation is not visualized. Aorta: The aortic root and ascending aorta are structurally normal, with no evidence of dilitation. IAS/Shunts: The atrial septum is grossly normal.  LEFT VENTRICLE PLAX 2D LVIDd:         3.70 cm  Diastology LVIDs:         2.70 cm  LV e' lateral:   6.42 cm/s LV PW:         0.90 cm  LV E/e' lateral: 10.1 LV IVS:        0.90 cm  LV e' medial:    5.66 cm/s LVOT diam:     1.90 cm  LV E/e' medial:  11.4 LV SV:         61 LV SV Index:   40 LVOT Area:     2.84 cm  RIGHT VENTRICLE RV S prime:     17.00 cm/s LEFT ATRIUM             Index LA diam:        3.40 cm 2.24 cm/m LA Vol (A2C):   30.0 ml 19.78 ml/m LA Vol (A4C):  28.2 ml 18.60 ml/m LA Biplane Vol: 29.4 ml 19.39 ml/m  AORTIC VALVE AV Area (Vmax):    2.25 cm AV Area (Vmean):   2.30 cm AV Area (VTI):     2.64 cm AV Vmax:           141.00 cm/s AV Vmean:          105.000 cm/s AV VTI:            0.230 m AV Peak Grad:      8.0 mmHg AV Mean Grad:      5.0 mmHg LVOT Vmax:         112.00 cm/s LVOT Vmean:        85.300 cm/s LVOT VTI:          0.214 m LVOT/AV VTI ratio: 0.93  AORTA Ao Root diam: 3.30 cm MITRAL VALVE MV Area (PHT): 3.99 cm    SHUNTS MV Decel Time: 190 msec    Systemic VTI:  0.21 m MV E velocity: 64.70 cm/s  Systemic Diam: 1.90 cm  MV A velocity: 72.40 cm/s MV E/A ratio:  0.89 Kristeen Miss MD Electronically signed by Kristeen Miss MD Signature Date/Time: 02/17/2020/3:13:26 PM    Final         Scheduled Meds: . brimonidine  1 drop Both Eyes BID  . dorzolamide  1 drop Both Eyes BID  . feeding supplement (ENSURE ENLIVE)  237 mL Oral TID BM  . feeding supplement (PRO-STAT SUGAR FREE 64)  30 mL Oral BID  . guaiFENesin  600 mg Oral BID  . insulin aspart  0-9 Units Subcutaneous Q4H  . latanoprost  1 drop Both Eyes QHS  . magnesium oxide  400 mg Oral BID  . midodrine  10 mg Oral BID WC  . multivitamin with minerals  1 tablet Oral Daily  . pantoprazole (PROTONIX) IV  40 mg Intravenous Q12H  . potassium chloride  40 mEq Oral Daily  . sodium chloride flush  3 mL Intravenous Q12H  . sodium chloride flush  3 mL Intravenous Q12H   Continuous Infusions: . sodium chloride    . azithromycin 500 mg (02/19/20 0032)  . ceFEPime (MAXIPIME) IV    . heparin 1,000 Units/hr (02/18/20 1905)  . sodium chloride       LOS: 4 days        Kathlen Mody, MD [facility]   To contact the attending provider between 7A-7P or the covering provider during after hours 7P-7A, please log into the web site [email or web address] and access using universal [place] password for that web site. If you do not have the password, please call the hospital operator.  02/19/2020, 12:50 PM

## 2020-02-19 NOTE — Progress Notes (Signed)
Seen by PCCM. Pt no urine output since after  in and out cath earlier, bladder scan done, .

## 2020-02-19 NOTE — Progress Notes (Signed)
ANTICOAGULATION CONSULT NOTE   Pharmacy Consult for heparin Indication: pulmonary embolus  No Known Allergies  Patient Measurements: Height: 5\' 2"  (157.5 cm) Weight: 109 lb 5.6 oz (49.6 kg) IBW/kg (Calculated) : 54.6 Heparin Dosing Weight: 52kg  Vital Signs: Temp: 97.9 F (36.6 C) (02/25 0757) Temp Source: Oral (02/25 0757) BP: 96/57 (02/25 0615) Pulse Rate: 71 (02/25 0615)  Labs: Recent Labs    02/17/20 0715 02/17/20 0715 02/17/20 1646 02/17/20 2301 02/17/20 2301 02/18/20 0505 02/18/20 0715 02/18/20 1621 02/19/20 0315  HGB 8.5*   < >  --   --   --  8.3*  --   --  8.2*  HCT 25.8*  --   --   --   --  24.4*  --   --  25.0*  PLT 214  --   --   --   --  227  --   --  249  APTT  --   --   --  32   < >  --  37* 31 77*  HEPARINUNFRC  --   --   --  0.14*  --   --  0.11*  --  0.35  CREATININE 0.91   < > 1.04  --   --  1.00  --   --  1.07   < > = values in this interval not displayed.    Estimated Creatinine Clearance: 39.9 mL/min (by C-G formula based on SCr of 1.07 mg/dL).   Assessment: 67 yoM on apixaban at home due to recent subacute PE in February. Apixaban held on admit with GIB, pharmacy now asked to start IV heparin. Last dose of apixaban was 2/21.  This morning heparin level and aPTT both therapeutic and correlating, CBC is stable. Will stop aPTT checks.  Goal of Therapy:  Heparin level 0.3-0.5 units/ml aPTT 66-85 seconds Monitor platelets by anticoagulation protocol: Yes   Plan:  -Continue heparin 1000 units/h -Daily heparin level and CBC -Monitor closely for S/Sx bleeding   3/21, PharmD, BCPS Clinical Pharmacist [phone] Please check AMION for all [facility] Pharmacy numbers 02/19/2020

## 2020-02-19 NOTE — Progress Notes (Addendum)
Pharmacy Antibiotic Note  Bruce Webb is a 79 y.o. male admitted on 02/15/2020 with dyspnea. Pharmacy has been consulted for cefepime dosing with concern for bacterial PNA. Pt had recent COVID-19 PNA hospitalization. Pt continues on day #4 of broad spectrum antibiotics. WBC remains elevated, no fevers, cultures negative, renal function stable.   Plan: -Reduce cefepime to 2g q24h with low body weight and unrevealing cultures -Azithromycin per MD   Height: 5\' 2"  (157.5 cm) Weight: 109 lb 5.6 oz (49.6 kg) IBW/kg (Calculated) : 54.6  Temp (24hrs), Avg:98.3 F (36.8 C), Min:97.9 F (36.6 C), Max:99 F (37.2 C)  Recent Labs  Lab 02/15/20 1640 02/15/20 1845 02/16/20 0541 02/16/20 1019 02/16/20 1147 02/17/20 0715 02/17/20 1646 02/18/20 0505 02/19/20 0315  WBC 28.1*  --   --  24.6*  --  24.9*  --  29.4* 26.2*  CREATININE 1.11  --    < >  --  0.97 0.91 1.04 1.00 1.07  LATICACIDVEN  --  1.8  --   --   --   --   --   --   --    < > = values in this interval not displayed.    Estimated Creatinine Clearance: 39.9 mL/min (by C-G formula based on SCr of 1.07 mg/dL).    No Known Allergies  Antimicrobials this admission: Ceftriaxone x1 2/21 >>  Azithromycin 2/22 >>  Cefepime 2/22 >>   Microbiology results: 2/21 BCx: NGTD  Thank you for allowing pharmacy to be a part of this patient's care.  3/21, PharmD, BCPS Clinical Pharmacist [phone] Please check AMION for all [facility] Pharmacy numbers 02/19/2020

## 2020-02-19 NOTE — Significant Event (Signed)
Rapid Response Event Note  Overview: MEWS   Nurse called to report a MEWS score, per nurse, patient was hypotensive earlier, MD was aware, orders were received and SBP improved > 90.   NO RRT INTERVENTIONS   Moger, Jessamine Barcia R

## 2020-02-19 NOTE — Significant Event (Signed)
Rapid Response Event Note  Overview: Follow Up   Initial Focused Assessment: I came by this afternoon to check on the patient, earlier patient had low BPs and the nurse called me to report a MEWS score. Upon arrival, patient was alert and oriented x 4, breathing comfortably on VM 10L- saturations 98%, SBP mid 90s, MAP > 65. Skin warm and dry, appears chronically fatigued and overall malnourished. Not in acute distress  Bruce Webb and I, speak the same native language, so we discussed the importance of getting out of bed with staff. He endorsed he was afraid because he said he gets short of breath and his oxygen level drops but I encouraged him to try to with staff and that it is expected that he feel short of breath with movement, he understood. I reviewed with him on how to use the IS and FV as well. Oxygen weaned down to VM 8L.  Interventions: -- No RRT INTERVENTIONS   Plan of Care: -- Monitor VS and Strict I/O (nurse to bladder scan every few hours) -- Encourage OOB and ambulation with staff -- Nutrition Consult   Event Summary:  Start Time 1430 End Time 1600   Lacson, Bruce Webb

## 2020-02-19 NOTE — Progress Notes (Signed)
Pts.11L HFNC  O2 Dropped to 82% with slight movement on  The bed. At rest O2 Sats 95%.will monitor.

## 2020-02-19 NOTE — Progress Notes (Signed)
Lactic Acid 2.4 MD paged made aware.

## 2020-02-20 ENCOUNTER — Inpatient Hospital Stay (HOSPITAL_COMMUNITY): Payer: Medicare Other

## 2020-02-20 LAB — BASIC METABOLIC PANEL
Anion gap: 8 (ref 5–15)
BUN: 30 mg/dL — ABNORMAL HIGH (ref 8–23)
CO2: 26 mmol/L (ref 22–32)
Calcium: 8.1 mg/dL — ABNORMAL LOW (ref 8.9–10.3)
Chloride: 103 mmol/L (ref 98–111)
Creatinine, Ser: 1.1 mg/dL (ref 0.61–1.24)
GFR calc Af Amer: >60 mL/min (ref >60)
GFR calc non Af Amer: >60 mL/min (ref >60)
Glucose, Bld: 180 mg/dL — ABNORMAL HIGH (ref 70–99)
Potassium: 4.6 mmol/L (ref 3.5–5.1)
Sodium: 137 mmol/L (ref 135–145)

## 2020-02-20 LAB — CBC
HCT: 26.1 % — ABNORMAL LOW (ref 39.0–52.0)
Hemoglobin: 8.2 g/dL — ABNORMAL LOW (ref 13.0–17.0)
MCH: 27.3 pg (ref 26.0–34.0)
MCHC: 31.4 g/dL (ref 30.0–36.0)
MCV: 87 fL (ref 80.0–100.0)
Platelets: 293 10*3/uL (ref 150–400)
RBC: 3 MIL/uL — ABNORMAL LOW (ref 4.22–5.81)
RDW: 15.9 % — ABNORMAL HIGH (ref 11.5–15.5)
WBC: 25.4 10*3/uL — ABNORMAL HIGH (ref 4.0–10.5)
nRBC: 0 % (ref 0.0–0.2)

## 2020-02-20 LAB — GLUCOSE, CAPILLARY
Glucose-Capillary: 153 mg/dL — ABNORMAL HIGH (ref 70–99)
Glucose-Capillary: 161 mg/dL — ABNORMAL HIGH (ref 70–99)
Glucose-Capillary: 164 mg/dL — ABNORMAL HIGH (ref 70–99)
Glucose-Capillary: 184 mg/dL — ABNORMAL HIGH (ref 70–99)
Glucose-Capillary: 188 mg/dL — ABNORMAL HIGH (ref 70–99)
Glucose-Capillary: 294 mg/dL — ABNORMAL HIGH (ref 70–99)

## 2020-02-20 LAB — HEPARIN LEVEL (UNFRACTIONATED)
Heparin Unfractionated: 0.24 IU/mL — ABNORMAL LOW (ref 0.30–0.70)
Heparin Unfractionated: 0.34 IU/mL (ref 0.30–0.70)

## 2020-02-20 LAB — ANTINUCLEAR ANTIBODIES, IFA: ANA Ab, IFA: NEGATIVE

## 2020-02-20 LAB — FERRITIN: Ferritin: 403 ng/mL — ABNORMAL HIGH (ref 24–336)

## 2020-02-20 LAB — D-DIMER, QUANTITATIVE: D-Dimer, Quant: 7.21 ug/mL-FEU — ABNORMAL HIGH (ref 0.00–0.50)

## 2020-02-20 LAB — MAGNESIUM: Magnesium: 2 mg/dL (ref 1.7–2.4)

## 2020-02-20 LAB — APTT: aPTT: 55 seconds — ABNORMAL HIGH (ref 24–36)

## 2020-02-20 LAB — C-REACTIVE PROTEIN: CRP: 19.2 mg/dL — ABNORMAL HIGH (ref <1.0)

## 2020-02-20 MED ORDER — TIMOLOL MALEATE 0.5 % OP SOLN
1.0000 [drp] | Freq: Two times a day (BID) | OPHTHALMIC | Status: DC
  Administered 2020-02-20 – 2020-03-10 (×39): 1 [drp] via OPHTHALMIC
  Filled 2020-02-20: qty 5

## 2020-02-20 MED ORDER — BISACODYL 10 MG RE SUPP
10.0000 mg | Freq: Once | RECTAL | Status: AC
  Administered 2020-02-20: 10 mg via RECTAL
  Filled 2020-02-20: qty 1

## 2020-02-20 MED ORDER — METHYLPREDNISOLONE SODIUM SUCC 40 MG IJ SOLR
40.0000 mg | Freq: Two times a day (BID) | INTRAMUSCULAR | Status: DC
  Administered 2020-02-20 – 2020-02-27 (×14): 40 mg via INTRAVENOUS
  Filled 2020-02-20 (×15): qty 1

## 2020-02-20 MED ORDER — APIXABAN 5 MG PO TABS
5.0000 mg | ORAL_TABLET | Freq: Two times a day (BID) | ORAL | Status: DC
  Administered 2020-02-20 – 2020-02-21 (×3): 5 mg via ORAL
  Filled 2020-02-20 (×4): qty 1

## 2020-02-20 NOTE — Progress Notes (Signed)
PROGRESS NOTE    Bruce Webb  PPJ:[id number] DOB: 1941/03/16 DOA: 02/15/2020 PCP: Glenda Chroman, MD  Brief Narrative:  -year-old gentleman prior history of essential hypertension, type 2 diabetes mellitus, recent COVID-19 illness initially diagnosed on 01/16/2020, left eye vision loss, recent discharge from the hospital on 01/31/2020 on 2 L of nasal cannula oxygen, pulmonary embolism presents to ED with worsening shortness of breath and worsening vision loss in his right eye.  Of note patient was seen by Dr. Carolynn Sayers with ophthalmology on 02/10/2020 and was diagnosed with end-stage glaucoma of both eyes despite maximal treatment with multiple eyedrops. On arrival to ED his chest x-ray was found to have bilateral infiltrates and elevated WBC count of 28,000's.  He was also found to have anemia with a hemoglobin of 6, underwent 2 units of PRBC transfusion.  He was started on IV Protonix for possible blood loss anemia and IV antibiotics for superimposed multifocal pneumonia.  Gastroenterology was consulted and initially was scheduled for endoscopy but in view of his tenuous respiratory status endoscopy was canceled.  Patient is currently on IV heparin and there have been no signs of bleeding or melanotic stools.  Discussed with Dr. Benson Norway today since his hemoglobin has been seen stable plan to transition to oral Eliquis and watch for any signs of bleeding.  Outpatient follow-up with gastroenterology for EGD. Yesterday,  patient had an episode of hypotension, responded well with IV fluid bolus.  His lactic acid was slightly elevated at 2.4 had improved to 1.9 after IV hydration.  IV Lasix was discontinued.  PCCM consulted for persistent hypoxia, elevated lactic acid and hypotension, recommended to wean him off oxygen gradually, get HRCT for evaluation     Assessment & Plan:   Principal Problem:   Acute GI bleeding Active Problems:   Essential hypertension   Pulmonary embolism (HCC)   Multifocal pneumonia  Hyponatremia   Hyperglycemia   Pressure injury of skin   Melena, acute blood loss anemia Suspicious for upper GI bleed.  GI was consulted and he was initially scheduled to have an upper endoscopy but was canceled as he had worsening oxygen requirement.   Patient underwent  2 units of PRBC transfusion and patient's hemoglobin has been stable around 8 since then.  On further discussion with Dr. Benson Norway, recommended to transition IV heparin to oral Eliquis for his pulmonary embolism and watch for signs of bleeding.  Transition IV PPI to oral PPI twice daily.    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 Patient was recently admitted for severe hypoxic respiratory failure from Covid illness and was discharged on 01/31/2020 on 2 L of oxygen. He was readmitted for secondary/superimposed bacterial pneumonia with leukocytosis and elevated procalcitonin. He was started on broad-spectrum IV antibiotics and he is currently requiring up to 6 L of nasal cannula oxygen to keep sats greater than 90%. His BNP was elevated at 234.8 and his echocardiogram showed Left ventricular ejection fraction, by estimation, is 60 to 65%. The left ventricle has no regional wall motion abnormalities. Left ventricular diastolic parameters are consistent with Grade II diastolic  dysfunction (pseudonormalization).  He was started on IV Lasix , but had to be discontinued due to hypotension.  Patient remains afebrile and his WBC count improved from 29.4-26.2 to 25.  PCCM consulted for persistent hypoxia, leukocytosis, elevated lactic acid levels and hypotension. Recommended HRCT, wean off the oxygen as  appropriate.      Elevated liver enzymes Patient denies any nausea or vomiting or abdominal pain, has good appetite. Repeat liver panel probably secondary to congestion from acute  diastolic heart failure vs ? Covid.  Ultrasound abdomen ordered for further evaluation..  Total bilirubin within normal limits,  INR wnl.    Mild hyponatremia Resolved   Hypokalemia Replaced.     Essential hypertension BP parameters are better today.     Pulmonary embolism Transitioned to oral eliquis today as his hemoglobin remained stable and no evidence of bleeding.    Constipation Senna Colace and MiraLAX ordered. No BM yet.    Acute urinary retention Unclear etiology , in an dout done yesterday.  Frequent bladder scans and watch urine output.    DVT prophylaxis: Heparin Code Status: Full code Family Communication: Discussed with daughter at bedside. On 2 /25  Disposition Plan:  Pt from home, PT eval recommending SNF VS home with home health and 24 hours supervision.  Pt not stable discharge yet, work up for acute respiratory failure with hypoxia is pending. He is still on 6lit of [place] oxygen .   Consultants:   PCCM   Procedures: None Antimicrobials: Cefepime and azithromycin. Subjective: Pt reports feeling the same as yesterday but not dizzy. No chest pain, nausea, vomiting, abd pain. No BM   Objective: Vitals:   02/20/20 0412 02/20/20 0514 02/20/20 0814 02/20/20 1127  BP:      Pulse: 72     Resp: (!) 21     Temp:   97.8 F (36.6 C) 98.7 F (37.1 C)  TempSrc:   Oral Oral  SpO2: 97% 93%    Weight:      Height:        Intake/Output Summary (Last 24 hours) at 02/20/2020 1812 Last data filed at 02/20/2020 0932 Gross per 24 hour  Intake 1321.87 ml  Output 350 ml  Net 971.87 ml   Filed Weights   02/18/20 0415 02/19/20 0659 02/20/20 0411  Weight: 52.1 kg 49.6 kg 50.3 kg    Examination:  General exam: Frail, ill-appearing gentleman, not in distress.  Respiratory system: decreased air entry at bases, no wheezing.  Cardiovascular system: s1s2 heard, RRR, no JVD, appears euvolemic, no pedal edema.  Gastrointestinal system:abd is soft NT ND  BS+ Central nervous system:Alert and oriented, grossly non focal.  extremities: no pedal edema.  Skin: Nasal bridge skin tear from nasal prongs use Psychiatry: mood is appropriate.     Data Reviewed: I have personally reviewed following labs and imaging studies  CBC: Recent Labs  Lab 02/15/20 1640 02/15/20 1640 02/16/20 1019 02/17/20 0715 02/18/20 0505 02/19/20 0315 02/20/20 0509  WBC 28.1*   < > 24.6* 24.9* 29.4* 26.2* 25.4*  NEUTROABS 23.2*  --   --   --   --   --   --   HGB 6.0*   < > 8.6* 8.5* 8.3* 8.2* 8.2*  HCT 19.0*   < > 25.4* 25.8* 24.4* 25.0* 26.1*  MCV 87.2   < > 83.0 84.0 82.7 85.9 87.0  PLT 266   < > 248 214 227 249 293   < > = values in this interval not displayed.   Basic Metabolic Panel: Recent Labs  Lab 02/17/20 0715 02/17/20 1646 02/18/20 0505 02/19/20 0315 02/20/20 1045  NA 133* 131* 131* 135 137  K 3.6 3.3* 3.0* 4.1 4.6  CL 102 97* 98 99 103  CO2 20* 23 25 27 26   GLUCOSE 141* 256*  154* 117* 180*  BUN 14 13 26* 28* 30*  CREATININE 0.91 1.04 1.00 1.07 1.10  CALCIUM 7.3* 7.6* 7.3* 7.7* 8.1*  MG  --   --  1.7  --  2.0   GFR: Estimated Creatinine Clearance: 39.4 mL/min (by C-G formula based on SCr of 1.1 mg/dL). Liver Function Tests: Recent Labs  Lab 02/16/20 0541 02/17/20 0715 02/19/20 0315  AST 119* 186* 189*  ALT 120* 158* 169*  ALKPHOS 130* 161* 147*  BILITOT 1.3* 1.1 0.8  PROT 5.0* 4.6* 5.1*  ALBUMIN 1.5* 1.3* 1.3*   No results for input(s): LIPASE, AMYLASE in the last 168 hours. No results for input(s): AMMONIA in the last 168 hours. Coagulation Profile: Recent Labs  Lab 02/15/20 1845 02/19/20 1635  INR 1.7* 1.2   Cardiac Enzymes: No results for input(s): CKTOTAL, CKMB, CKMBINDEX, TROPONINI in the last 168 hours. BNP (last 3 results) No results for input(s): PROBNP in the last 8760 hours. HbA1C: No results for input(s): HGBA1C in the last 72 hours. CBG: Recent Labs  Lab 02/20/20 0016 02/20/20 0406 02/20/20 0815  02/20/20 1129 02/20/20 1649  GLUCAP 188* 161* 153* 164* 184*   Lipid Profile: No results for input(s): CHOL, HDL, LDLCALC, TRIG, CHOLHDL, LDLDIRECT in the last 72 hours. Thyroid Function Tests: No results for input(s): TSH, T4TOTAL, FREET4, T3FREE, THYROIDAB in the last 72 hours. Anemia Panel: Recent Labs    02/20/20 1045  FERRITIN 403*   Sepsis Labs: Recent Labs  Lab 02/15/20 1845 02/16/20 0541 02/17/20 0715 02/19/20 1202 02/19/20 1635  PROCALCITON  --  0.66 0.50  --  0.99  LATICACIDVEN 1.8  --   --  2.4* 1.9    Recent Results (from the past 240 hour(s))  Culture, blood (routine x 2)     Status: None (Preliminary result)   Collection Time: 02/15/20  6:30 PM   Specimen: BLOOD  Result Value Ref Range Status   Specimen Description BLOOD SITE NOT SPECIFIED  Final   Special Requests   Final    BOTTLES DRAWN AEROBIC AND ANAEROBIC Blood Culture adequate volume   Culture NO GROWTH 4 DAYS  Final   Report Status PENDING  Incomplete  Culture, blood (routine x 2)     Status: None (Preliminary result)   Collection Time: 02/15/20  6:45 PM   Specimen: BLOOD  Result Value Ref Range Status   Specimen Description BLOOD SITE NOT SPECIFIED  Final   Special Requests   Final    BOTTLES DRAWN AEROBIC AND ANAEROBIC Blood Culture adequate volume   Culture NO GROWTH 4 DAYS  Final   Report Status PENDING  Incomplete         Radiology Studies: US Abdomen Limited  Result Date: 02/19/2020 CLINICAL DATA:  Elevated LFTs EXAM: ULTRASOUND ABDOMEN LIMITED RIGHT UPPER QUADRANT COMPARISON:  None. FINDINGS: Gallbladder: No gallstones or wall thickening visualized. No sonographic Murphy sign noted by sonographer. Common bile duct: Diameter: 5 mm Liver: No focal lesion identified. Within normal limits in parenchymal echogenicity. Portal vein is patent on color Doppler imaging with normal direction of blood flow towards the liver. Other: None. IMPRESSION: No acute abnormality. No specific sonographic  abnormality identified to explain the patient's elevated LFTs. Electronically Signed   By: Katherine Mantle M.D.   On: 02/19/2020 19:21        Scheduled Meds: . apixaban  5 mg Oral BID  . brimonidine  1 drop Both Eyes BID  . budesonide (PULMICORT) nebulizer solution  0.5 mg Nebulization BID  .  dorzolamide  1 drop Both Eyes BID  . feeding supplement (ENSURE ENLIVE)  237 mL Oral TID BM  . feeding supplement (PRO-STAT SUGAR FREE 64)  30 mL Oral BID  . guaiFENesin  600 mg Oral BID  . insulin aspart  0-9 Units Subcutaneous Q4H  . latanoprost  1 drop Both Eyes QHS  . methylPREDNISolone (SOLU-MEDROL) injection  40 mg Intravenous Q12H  . midodrine  10 mg Oral BID WC  . multivitamin with minerals  1 tablet Oral Daily  . pantoprazole (PROTONIX) IV  40 mg Intravenous Q12H  . polyethylene glycol  17 g Oral Daily  . potassium chloride  40 mEq Oral Daily  . senna-docusate  2 tablet Oral BID  . sodium chloride flush  3 mL Intravenous Q12H  . sodium chloride flush  3 mL Intravenous Q12H  . timolol  1 drop Both Eyes BID  . white petrolatum   Topical BID   Continuous Infusions: . sodium chloride    . ceFEPime (MAXIPIME) IV 2 g (02/19/20 2211)  . heparin 1,100 Units/hr (02/20/20 0629)  . sodium chloride       LOS: 5 days        Kathlen Mody, MD [facility]   To contact the attending provider between 7A-7P or the covering provider during after hours 7P-7A, please log into the web site [email or web address] and access using universal [place] password for that web site. If you do not have the password, please call the hospital operator.  02/20/2020, 6:12 PM

## 2020-02-20 NOTE — Progress Notes (Signed)
ANTICOAGULATION CONSULT NOTE   Pharmacy Consult for heparin Indication: pulmonary embolus  No Known Allergies  Patient Measurements: Height: 5\' 2"  (157.5 cm) Weight: 110 lb 14.3 oz (50.3 kg) IBW/kg (Calculated) : 54.6 Heparin Dosing Weight: 52kg  Vital Signs: Temp: 97.5 F (36.4 C) (02/26 0411) Temp Source: Oral (02/26 0411) BP: 107/62 (02/26 0411) Pulse Rate: 72 (02/26 0412)  Labs: Recent Labs    02/17/20 1646 02/17/20 2301 02/18/20 0505 02/18/20 0505 02/18/20 0715 02/18/20 0715 02/18/20 1621 02/19/20 0315 02/19/20 1635 02/20/20 0509  HGB  --   --  8.3*   < >  --   --   --  8.2*  --  8.2*  HCT  --   --  24.4*  --   --   --   --  25.0*  --  26.1*  PLT  --   --  227  --   --   --   --  249  --  293  APTT  --    < >  --   --  37*   < > 31 77*  --  55*  LABPROT  --   --   --   --   --   --   --   --  15.1  --   INR  --   --   --   --   --   --   --   --  1.2  --   HEPARINUNFRC  --    < >  --   --  0.11*  --   --  0.35  --  0.24*  CREATININE 1.04  --  1.00  --   --   --   --  1.07  --   --    < > = values in this interval not displayed.    Estimated Creatinine Clearance: 40.5 mL/min (by C-G formula based on SCr of 1.07 mg/dL).   Assessment: 79 y.o. male with h/o PE, Eliquis on hold, for heparin  Goal of Therapy:  Heparin level 0.3-0.5 units/mL Monitor platelets by anticoagulation protocol: Yes   Plan:  Increase Heparin 1100 units/hr  70, PharmD, BCPS  02/20/2020

## 2020-02-20 NOTE — Progress Notes (Signed)
ANTICOAGULATION CONSULT NOTE   Pharmacy Consult for heparin Indication: 01/27/20 small subacute PE  No Known Allergies  Patient Measurements: Height: 5\' 2"  (157.5 cm) Weight: 110 lb 14.3 oz (50.3 kg) IBW/kg (Calculated) : 54.6 Heparin Dosing Weight: 52kg  Vital Signs: Temp: 98.7 F (37.1 C) (02/26 1127) Temp Source: Oral (02/26 1127) BP: 107/62 (02/26 0411) Pulse Rate: 72 (02/26 0412)  Labs: Recent Labs    02/18/20 0505 02/18/20 0715 02/18/20 1621 02/19/20 0315 02/19/20 1635 02/20/20 0509 02/20/20 1045 02/20/20 1327  HGB 8.3*   < >  --  8.2*  --  8.2*  --   --   HCT 24.4*  --   --  25.0*  --  26.1*  --   --   PLT 227  --   --  249  --  293  --   --   APTT  --    < > 31 77*  --  55*  --   --   LABPROT  --   --   --   --  15.1  --   --   --   INR  --   --   --   --  1.2  --   --   --   HEPARINUNFRC  --    < >  --  0.35  --  0.24*  --  0.34  CREATININE 1.00  --   --  1.07  --   --  1.10  --    < > = values in this interval not displayed.    Estimated Creatinine Clearance: 39.4 mL/min (by C-G formula based on SCr of 1.1 mg/dL).   Assessment: 79 y.o. male with 01/27/20 small subacute PE, PTA Eliquis on hold, last dose 2/21. Started on heparin with low goal for H/H drop requiring transfusion on admit, stable H/H and platelets x5 days.  HL therapeutic. Discussed with MD, ok to switch back to apixaban tonight.   Goal of Therapy:  Monitor platelets by anticoagulation protocol: Yes   Plan:  Continue Heparin 1100 units/hr for now DC heparin when apixaban starts at 22:00 Monitor for signs/symptoms of bleeding    3/21, PharmD, BCPS, BCCP Clinical Pharmacist  Please check AMION for all [facility] Pharmacy phone numbers After 10:00 PM, call Main Pharmacy [phone]

## 2020-02-20 NOTE — Consult Note (Signed)
NAME:  Bruce Webb, MRN:  [id number], DOB:  1941/02/06, LOS: 5 ADMISSION DATE:  02/15/2020, CONSULTATION DATE:  02/19/2020 REFERRING MD:  Dr. Karleen Hampshire, CHIEF COMPLAINT:  Hypotension/ hypoxia    Brief History   79 year old male post COVID and PE in January on Eliquis admitted 2/21 to [facility] for extertional dyspnea found to be anemic with hemoccult positive stools.  GI consulted and transfused PRBC with since stabilized Hgb but has had persistent higher O2 requirements.  Was diuresed but today developed hypotension, with ongoing higher O2 requirements, PCCM was asked to consult.     History of present illness   HPI obtained via interpreter and from daughter.   79 year old male, who speaks [place], with history of HTN, HFpEF, DMT2, glaucoma with recent COVID 01/16/2020 with subsequent subacute PE on Eliquis and home O2 2L [place] who presented 2/21 with exertional dyspnea, black tarry stools, and worsening vision loss in his right eye.  Patient lives at home with his daughter and pre-COVID was independent in his ADLs.  He is a retired and prior worked in an office.  Denies any smoking or pulmonary history.  Reports prior to COVID, had a chronic non-productive cough for 10-15 years and his primary care MD treated it occasionally with cough medicine.   Patient with recent hospitalization at [facility] for Fontanet after initial positive 1/22 for failed outpatient treatment for COVID infection despite monoclonal antibodies and steroids.  While at [facility], treated with remdesivir and decadron and found to have a PE and was discharged home on 2/6 on 2L O2 and Eliquis.   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    On admit, found to have worsening anemia, 8.8-> 6 with positive fecal occult blood testing and hypotension responsive to fluid.  Additionally found to have WBC 28k, CXR with worsening multifocal airspace consolidation bilaterally.  He was started  empirically on zithromax and cefepime. PCT 0.5.  He was transfused with 2 units PRBC, started on protonix, and GI consulted. However, given tenuous respiratory status and increased O2 needs, GI deferred EGD.  Hgb trend since stable and therefore placed on heparin gtt on 2/23.  He was diuresed on 2/23 given fluid overload post blood transfusion with good output then but has since required midodrine to help with soft blood pressures.  On 2/25, he had worsening hypotension despite midodrine, ongoing elevated O2 needs, and transaminitis, PCCM asked to consult.     Past Medical History  HTN, HFpEF, DMT2, glaucoma with progressive vision loss (left, now rapid loss in right) - recent COVID 01/16/2020 with subsequent PE on Eliquis and O2 2L [place]  Significant Hospital Events   2/21 admit 2/25 PCCM consulted for hypotension and persistent hypoxemia  Consults:  GI  PCCM  Procedures:   Significant Diagnostic Tests:  2/21 CTH >> 1. No acute intracranial abnormalities. 2. Mild cerebral atrophy with chronic microvascular ischemic changes in the cerebral white matter, as above.  CTA 01/27/20 - Diffuse subpleural/peripheral interstitial ground glass opacities with increased involvement in the the lower lobes bilaterally R>L. CXR 02/16/20 R>L multifocal pneumonia R>L, no edema or effusion  2/23 TTE >> 1. Left ventricular ejection fraction, by estimation, is 60 to 65%. The left ventricle has normal function. The left ventricle has no regional wall motion abnormalities. Left ventricular diastolic parameters are consistent with Grade II diastolic dysfunction (pseudonormalization).  2. The history indicates the patient has a pulmonary embolus. The RV is  not  dilated and there is normal RV function. . Right ventricular systolic function is low normal. The right ventricular size is normal.  3. The mitral valve is normal in structure and function. Mild mitral valve regurgitation. No evidence of mitral stenosis.    4. The aortic valve is normal in structure and function. Aortic valve regurgitation is not visualized. No aortic stenosis is present.   Micro Data:  2/21 BCx 2 >> ngtd x 4 days  Antimicrobials:  2/21 azithro>> 2/21 ceftriaxone  2/22 cefepime >>  Interim history/subjective:  Has severe desaturations with activity and eating. Required 10L while eating his meal today. No acute distress but continues to have shortness of breath. Denies chest pain.   Objective   Blood pressure 107/62, pulse 72, temperature 98.7 F (37.1 C), temperature source Oral, resp. rate (!) 21, height 5\' 2"  (1.575 m), weight 50.3 kg, SpO2 93 %.    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, blindness Respiratory: Bilateral crackles throughout Cardiovascular: RRR, -M/R/G, no JVD Extremities:-Edema,-tenderness Neuro: AAO x4, CNII-XII grossly intact Skin: Intact, no rashes or bruising Psych: Normal mood, normal affect GU: Condom cath in place  Resolved Hospital Problem list    Assessment & Plan:  79 year old male never smoker with prior COVID-19 infection diagnosed in January and requiring hospitalization from 2/1-2/6, recently diagnosed pulmonary emboli on anticoagulation and end-stage glaucoma with right vision loss in addition to his known left vision loss who presents with worsening shortness of breath and admitted for acute on chronic respiratory failure secondary to multifocal pneumonia. His oxygen requirement has increased 10L and now requiring face mask due to nasal discomfort. He has been adequately diuresed for  possible heart failure exacerbation and currently net negative 2L. PCCM was consulted for hypotension in the last 24 hours and persistent hypoxemia.  I suspect his hypoxemia is multifactorial. His heart failure appears adequately treated and patient is euvolemic on exam. Hypotension did resolve with 500cc IVF bolus. On review of his chest imaging, CXR demonstrates interval worsening of peripheral reticular opacities concerning for fibrosis or post-inflammatory viral process in additional lobar pneumonia. His severe deconditioning also is playing a role in his respiratory failure.  Worsening hypoxemia with any exertion requiring 10-12L O2 via [place].  Acute hypoxemic respiratory failure secondary to post-inflammatory COVID-19 infection vs pulmonary fibrosis, atelectasis, pulmonary emboli. Completed treatment for pneumonia. Suspect deconditioning contributing as well. --HRCT pending --Wean supplemental oxygen as tolerated for goal SpO2 >88%. Facemask as needed for comfort/compliance. --Aggressive pulmonary toilet: IS, flutter valve, mucinex --Continue nebulizers: Pulmicort and Duonebs --Start solumedrol 40 mg BID  Hypotension - resolved Prior acute blood loss anemia when on heparin. EGD deferred due to respiratory status. Hg stable with no further bleeding --Hold on further diuresis. Patient appears euvolemic --Midodrine per primary --Goal MAP > 65  End stage Glaucoma Per primary   Pulmonary will continue to follow  February, M.D. [facility] Pulmonary/Critical Care Medicine 02/20/2020 3:57 PM   Best practice:  Diet: per RD recs Pain/Anxiety/Delirium protocol (if indicated): n/a VAP protocol (if  indicated): n/a DVT prophylaxis: heparin gtt GI prophylaxis: PPI Glucose control: SSI Mobility: push  Code Status: Full  Family Communication: Patient updated at bedside Disposition: 3E/ PCU   Labs   CBC: Recent Labs  Lab 02/15/20 1640 02/15/20 1640 02/16/20 1019 02/17/20 0715  02/18/20 0505 02/19/20 0315 02/20/20 0509  WBC 28.1*   < > 24.6* 24.9* 29.4* 26.2* 25.4*  NEUTROABS 23.2*  --   --   --   --   --   --   HGB 6.0*   < > 8.6* 8.5* 8.3* 8.2* 8.2*  HCT 19.0*   < > 25.4* 25.8* 24.4* 25.0* 26.1*  MCV 87.2   < > 83.0 84.0 82.7 85.9 87.0  PLT 266   < > 248 214 227 249 293   < > = values in this interval not displayed.    Basic Metabolic Panel: Recent Labs  Lab 02/17/20 0715 02/17/20 1646 02/18/20 0505 02/19/20 0315 02/20/20 1045  NA 133* 131* 131* 135 137  K 3.6 3.3* 3.0* 4.1 4.6  CL 102 97* 98 99 103  CO2 20* 23 25 27 26   GLUCOSE 141* 256* 154* 117* 180*  BUN 14 13 26* 28* 30*  CREATININE 0.91 1.04 1.00 1.07 1.10  CALCIUM 7.3* 7.6* 7.3* 7.7* 8.1*  MG  --   --  1.7  --  2.0   GFR: Estimated Creatinine Clearance: 39.4 mL/min (by C-G formula based on SCr of 1.1 mg/dL). Recent Labs  Lab 02/15/20 1845 02/16/20 0541 02/16/20 1019 02/17/20 0715 02/18/20 0505 02/19/20 0315 02/19/20 1202 02/19/20 1635 02/20/20 0509  PROCALCITON  --  0.66  --  0.50  --   --   --  0.99  --   WBC  --   --    < > 24.9* 29.4* 26.2*  --   --  25.4*  LATICACIDVEN 1.8  --   --   --   --   --  2.4* 1.9  --    < > = values in this interval not displayed.    Liver Function Tests: Recent Labs  Lab 02/16/20 0541 02/17/20 0715 02/19/20 0315  AST 119* 186* 189*  ALT 120* 158* 169*  ALKPHOS 130* 161* 147*  BILITOT 1.3* 1.1 0.8  PROT 5.0* 4.6* 5.1*  ALBUMIN 1.5* 1.3* 1.3*   No results for input(s): LIPASE, AMYLASE in the last 168 hours. No results for input(s): AMMONIA in the last 168 hours.  ABG No results found for: PHART, PCO2ART, PO2ART, HCO3, TCO2, ACIDBASEDEF, O2SAT   Coagulation Profile: Recent Labs  Lab 02/15/20 1845 02/19/20 1635  INR 1.7* 1.2    Cardiac Enzymes: No results for input(s): CKTOTAL, CKMB, CKMBINDEX, TROPONINI in the last 168 hours.  HbA1C: Hgb A1c MFr Bld  Date/Time Value Ref Range Status  02/16/2020 05:41 AM 7.1 (H) 4.8 - 5.6  % Final    Comment:    (NOTE) Pre diabetes:          5.7%-6.4% Diabetes:              >6.4% Glycemic control for   <7.0% adults with diabetes     CBG: Recent Labs  Lab 02/19/20 2019 02/20/20 0016 02/20/20 0406 02/20/20 0815 02/20/20 1129  GLUCAP 166* 188* 161* 153* 164*

## 2020-02-20 NOTE — Progress Notes (Addendum)
   02/20/20 0000  MEWS Assessment  Is this an acute change? No (pt has been yellow)   Patient MEWS yellow due to RR of 21 and BP 89/51.  Patient has had lower BP and this is not an acute change.  APP notified about BP.  Patient asymptomatic.   [id number] returned page and stated to monitor BP and call if MAP below 60.

## 2020-02-20 NOTE — Progress Notes (Signed)
Physical Therapy Treatment Patient Details Name: Bruce Webb MRN: [id number] DOB: Sep 06, 1941 Today's Date: 02/20/2020    History of Present Illness 79 y.o. gentleman prior history of essential hypertension, type 2 diabetes mellitus, recent COVID-19 illness initially diagnosed on 01/16/2020. Patient was discharged from hospital on recent discharge from the hospital on 01/31/2020 on 2L of nasal cannula oxygen and had been found to have pulmonary embolism. 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 He presented to the emergency department with exertional dyspnea, melena, and progressive vision loss involving the right eye. Chest x-ray of 2/22: asymmetric airspace disease, right greater than left. WBC increased on 2/24; afebrile.    PT Comments    Patient seen for mobility progression. Pt able to transfer bed to recliner and then back to bed during session as transport came to take pt for CT. Pt with SpO2 desat to 76% on 10-15L HFNC with mobility and recovered with increased time, PLB, and use of flutter valve. Pt eager to mobilize and wanting to ambulate this session however due to pulmonary function did not attempt. Pt will continue to benefit from further skilled PT services to maximize independence and safety with mobility.    Follow Up Recommendations  Home health PT;Supervision/Assistance - 24 hour;SNF     Equipment Recommendations  (TBD)    Recommendations for Other Services       Precautions / Restrictions Precautions Precautions: Fall Precaution Comments: watch O2 Restrictions Weight Bearing Restrictions: No    Mobility  Bed Mobility Overal bed mobility: Needs Assistance Bed Mobility: Supine to Sit;Sit to Supine     Supine to sit: HOB elevated;Mod assist Sit to supine: Min assist   General bed mobility comments: assist to bring bilat LE and hips to EOB and then to bring bilat LE into bed  Transfers Overall transfer level: Needs assistance Equipment  used: Rolling walker (2 wheeled) Transfers: Sit to/from [employer] Sit to Stand: Min guard Stand pivot transfers: Min assist       General transfer comment: assist to steady and manage RW when pivoting   Ambulation/Gait             General Gait Details: unable to due to SpO2 desat   Stairs             Wheelchair Mobility    Modified Rankin (Stroke Patients Only)       Balance Overall balance assessment: Needs assistance Sitting-balance support: No upper extremity supported;Feet supported Sitting balance-Leahy Scale: Fair Sitting balance - Comments: close supervision   Standing balance support: Bilateral upper extremity supported;During functional activity Standing balance-Leahy Scale: Poor                              Cognition Arousal/Alertness: Awake/alert Behavior During Therapy: WFL for tasks assessed/performed Overall Cognitive Status: Difficult to assess                                 General Comments: video interpreter used       Exercises      General Comments General comments (skin integrity, edema, etc.): pt requires up to 15L O2 via HFNC for SpO2 recovery after transfers; SpO2 desat to 76% and increased time, PLB, and use of flutter valve required with each positional change       Pertinent Vitals/Pain Pain Assessment: Faces Faces Pain Scale: No hurt  Home Living                      Prior Function            PT Goals (current goals can now be found in the care plan section) Progress towards PT goals: Progressing toward goals    Frequency    Min 3X/week      PT Plan Current plan remains appropriate    Co-evaluation              AM-PAC PT "6 Clicks" Mobility   Outcome Measure  Help needed turning from your back to your side while in a flat bed without using bedrails?: A Little Help needed moving from lying on your back to sitting on the side of a flat bed  without using bedrails?: A Little Help needed moving to and from a bed to a chair (including a wheelchair)?: A Little Help needed standing up from a chair using your arms (e.g., wheelchair or bedside chair)?: A Little Help needed to walk in hospital room?: A Lot Help needed climbing 3-5 steps with a railing? : A Lot 6 Click Score: 16    End of Session Equipment Utilized During Treatment: Oxygen Activity Tolerance: (limited by SpO2 desaturation and need for up to 15L HFNC) Patient left: in bed;with nursing/sitter in room;with family/visitor present;with call bell/phone within reach Nurse Communication: Mobility status PT Visit Diagnosis: Unsteadiness on feet (R26.81);Other abnormalities of gait and mobility (R26.89)     Time: 9604-5409 PT Time Calculation (min) (ACUTE ONLY): 47 min  Charges:  $Gait Training: 23-37 mins $Therapeutic Activity: 8-22 mins                     Earney Navy, PTA Acute Rehabilitation Services Pager: [phone] Office: [phone]     Darliss Cheney 02/20/2020, 5:32 PM

## 2020-02-21 DIAGNOSIS — J9601 Acute respiratory failure with hypoxia: Secondary | ICD-10-CM

## 2020-02-21 DIAGNOSIS — R0902 Hypoxemia: Secondary | ICD-10-CM

## 2020-02-21 DIAGNOSIS — Z8701 Personal history of pneumonia (recurrent): Secondary | ICD-10-CM

## 2020-02-21 DIAGNOSIS — Z8616 Personal history of COVID-19: Secondary | ICD-10-CM

## 2020-02-21 DIAGNOSIS — J984 Other disorders of lung: Secondary | ICD-10-CM

## 2020-02-21 DIAGNOSIS — Z86711 Personal history of pulmonary embolism: Secondary | ICD-10-CM

## 2020-02-21 DIAGNOSIS — R918 Other nonspecific abnormal finding of lung field: Secondary | ICD-10-CM

## 2020-02-21 DIAGNOSIS — J189 Pneumonia, unspecified organism: Secondary | ICD-10-CM

## 2020-02-21 LAB — RESPIRATORY PANEL BY PCR

## 2020-02-21 LAB — GLUCOSE, CAPILLARY
Glucose-Capillary: 154 mg/dL — ABNORMAL HIGH (ref 70–99)
Glucose-Capillary: 194 mg/dL — ABNORMAL HIGH (ref 70–99)
Glucose-Capillary: 184 mg/dL — ABNORMAL HIGH (ref 70–99)
Glucose-Capillary: 248 mg/dL — ABNORMAL HIGH (ref 70–99)
Glucose-Capillary: 212 mg/dL — ABNORMAL HIGH (ref 70–99)
Glucose-Capillary: 297 mg/dL — ABNORMAL HIGH (ref 70–99)

## 2020-02-21 LAB — CBC
HCT: 27.9 % — ABNORMAL LOW (ref 39.0–52.0)
Hemoglobin: 9.1 g/dL — ABNORMAL LOW (ref 13.0–17.0)
MCH: 28 pg (ref 26.0–34.0)
MCHC: 32.6 g/dL (ref 30.0–36.0)
MCV: 85.8 fL (ref 80.0–100.0)
Platelets: 360 10*3/uL (ref 150–400)
RBC: 3.25 MIL/uL — ABNORMAL LOW (ref 4.22–5.81)
RDW: 16.3 % — ABNORMAL HIGH (ref 11.5–15.5)
WBC: 28.1 10*3/uL — ABNORMAL HIGH (ref 4.0–10.5)
nRBC: 0 % (ref 0.0–0.2)

## 2020-02-21 LAB — HEPATIC FUNCTION PANEL
ALT: 116 U/L — ABNORMAL HIGH (ref 0–44)
AST: 77 U/L — ABNORMAL HIGH (ref 15–41)
Albumin: 1.3 g/dL — ABNORMAL LOW (ref 3.5–5.0)
Alkaline Phosphatase: 167 U/L — ABNORMAL HIGH (ref 38–126)
Bilirubin, Direct: 0.1 mg/dL (ref 0.0–0.2)
Indirect Bilirubin: 0.2 mg/dL — ABNORMAL LOW (ref 0.3–0.9)
Total Bilirubin: 0.3 mg/dL (ref 0.3–1.2)
Total Protein: 6.4 g/dL — ABNORMAL LOW (ref 6.5–8.1)

## 2020-02-21 LAB — CYCLIC CITRUL PEPTIDE ANTIBODY, IGG/IGA: CCP Antibodies IgG/IgA: 6 units (ref 0–19)

## 2020-02-21 NOTE — Progress Notes (Signed)
Pt is SOB  with movement, Oxygen sat dropped to lower 70's respiratory rate up to 30's. Pt is on 10L HFNC. When resting his Oxygen sat up to 88%. Clance Boll NP made aware.

## 2020-02-21 NOTE — Progress Notes (Signed)
Sputum specimen container given to patient. Family member at bedside. Explained to patient how to collect sputum. Patient stated that he has not been able to cough up anything or its a dry cough. Family member agreed she understood as well. RT told patient we will leave it on bedside table in case he is able to cough up any sputum.

## 2020-02-21 NOTE — Discharge Instructions (Addendum)
Acute Respiratory Failure, Adult  Acute respiratory failure occurs when there is not enough oxygen passing from your lungs to your body. When this happens, your lungs have trouble removing carbon dioxide from the blood. This causes your blood oxygen level to drop too low as carbon dioxide builds up. Acute respiratory failure is a medical emergency. It can develop quickly, but it is temporary if treated promptly. Your lung capacity, or how much air your lungs can hold, may improve with time, exercise, and treatment. What are the causes? There are many possible causes of acute respiratory failure, including:  Lung injury.  Chest injury or damage to the ribs or tissues near the lungs.  Lung conditions that affect the flow of air and blood into and out of the lungs, such as pneumonia, acute respiratory distress syndrome, and cystic fibrosis.  Medical conditions, such as strokes or spinal cord injuries, that affect the muscles and nerves that control breathing.  Blood infection (sepsis).  Inflammation of the pancreas (pancreatitis).  A blood clot in the lungs (pulmonary embolism).  A large-volume blood transfusion.  Burns.  Near-drowning.  Seizure.  Smoke inhalation.  Reaction to medicines.  Alcohol or drug overdose. What increases the risk? This condition is more likely to develop in people who have:  A blocked airway.  Asthma.  A condition or disease that damages or weakens the muscles, nerves, bones, or tissues that are involved in breathing.  A serious infection.  A health problem that blocks the unconscious reflex that is involved in breathing, such as hypothyroidism or sleep apnea.  A lung injury or trauma. What are the signs or symptoms? Trouble breathing is the main symptom of acute respiratory failure. Symptoms may also include:  Rapid breathing.  Restlessness or anxiety.  Skin, lips, or fingernails that appear blue (cyanosis).  Rapid heart  rate.  Abnormal heart rhythms (arrhythmias).  Confusion or changes in behavior.  Tiredness or loss of energy.  Feeling sleepy or having a loss of consciousness. How is this diagnosed? Your health care provider can diagnose acute respiratory failure with a medical history and physical exam. During the exam, your health care provider will listen to your heart and check for crackling or wheezing sounds in your lungs. Your may also have tests to confirm the diagnosis and determine what is causing respiratory failure. These tests may include:  Measuring the amount of oxygen in your blood (pulse oximetry). The measurement comes from a small device that is placed on your finger, earlobe, or toe.  Other blood tests to measure blood gases and to look for signs of infection.  Sampling your cerebral spinal fluid or tracheal fluid to check for infections.  Chest X-ray to look for fluid in spaces that should be filled with air.  Electrocardiogram (ECG) to look at the heart's electrical activity. How is this treated? Treatment for this condition usually takes places in a hospital intensive care unit (ICU). Treatment depends on what is causing the condition. It may include one or more treatments until your symptoms improve. Treatment may include:  Supplemental oxygen. Extra oxygen is given through a tube in the nose, a face mask, or a hood.  A device such as a continuous positive airway pressure (CPAP) or bi-level positive airway pressure (BiPAP or BPAP) machine. This treatment uses mild air pressure to keep the airways open. A mask or other device will be placed over your nose or mouth. A tube that is connected to a motor will deliver oxygen through  the mask.  Ventilator. This treatment helps move air into and out of the lungs. This may be done with a bag and mask or a machine. For this treatment, a tube is placed in your windpipe (trachea) so air and oxygen can flow to the lungs.  Extracorporeal  membrane oxygenation (ECMO). This treatment temporarily takes over the function of the heart and lungs, supplying oxygen and removing carbon dioxide. ECMO gives the lungs a chance to recover. It may be used if a ventilator is not effective.  Tracheostomy. This is a procedure that creates a hole in the neck to insert a breathing tube.  Receiving fluids and medicines.  Rocking the bed to help breathing. Follow these instructions at home:  Take over-the-counter and prescription medicines only as told by your health care provider.  Return to normal activities as told by your health care provider. Ask your health care provider what activities are safe for you.  Keep all follow-up visits as told by your health care provider. This is important. How is this prevented? Treating infections and medical conditions that may lead to acute respiratory failure can help prevent the condition from developing. Contact a health care provider if:  You have a fever.  Your symptoms do not improve or they get worse. Get help right away if:  You are having trouble breathing.  You lose consciousness.  Your have cyanosis or turn blue.  You develop a rapid heart rate.  You are confused. These symptoms may represent a serious problem that is an emergency. Do not wait to see if the symptoms will go away. Get medical help right away. Call your local emergency services (911 in the [place] Do not drive yourself to the hospital. This information is not intended to replace advice given to you by your health care provider. Make sure you discuss any questions you have with your health care provider. Document Revised: 11/23/2017 Document Reviewed: 06/28/2016 Elsevier Patient Education  2020 [employer] Inc[employer]   Community-Acquired Pneumonia, Adult Pneumonia is an infection of the lungs. It causes swelling in the airways of the lungs. Mucus and fluid may also build up inside the airways. One type of pneumonia can happen  while a person is in a hospital. A different type can happen when a person is not in a hospital (community-acquired pneumonia).  What are the causes?  This condition is caused by germs (viruses, bacteria, or fungi). Some types of germs can be passed from one person to another. This can happen when you breathe in droplets from the cough or sneeze of an infected person. What increases the risk? You are more likely to develop this condition if you:  Have a long-term (chronic) disease, such as: ? Chronic obstructive pulmonary disease (COPD). ? Asthma. ? Cystic fibrosis. ? Congestive heart failure. ? Diabetes. ? Kidney disease.  Have HIV.  Have sickle cell disease.  Have had your spleen removed.  Do not take good care of your teeth and mouth (poor dental hygiene).  Have a medical condition that increases the risk of breathing in droplets from your own mouth and nose.  Have a weakened body defense system (immune system).  Are a smoker.  Travel to areas where the germs that cause this illness are common.  Are around certain animals or the places they live. What are the signs or symptoms?  A dry cough.  A wet (productive) cough.  Fever.  Sweating.  Chest pain. This often happens when breathing deeply or coughing.  Fast  breathing or trouble breathing.  Shortness of breath.  Shaking chills.  Feeling tired (fatigue).  Muscle aches. How is this treated? Treatment for this condition depends on many things. Most adults can be treated at home. In some cases, treatment must happen in a hospital. Treatment may include:  Medicines given by mouth or through an IV tube.  Being given extra oxygen.  Respiratory therapy. In rare cases, treatment for very bad pneumonia may include:  Using a machine to help you breathe.  Having a procedure to remove fluid from around your lungs. Follow these instructions at home: Medicines  Take over-the-counter and prescription medicines  only as told by your doctor. ? Only take cough medicine if you are losing sleep.  If you were prescribed an antibiotic medicine, take it as told by your doctor. Do not stop taking the antibiotic even if you start to feel better. General instructions   Sleep with your head and neck raised (elevated). You can do this by sleeping in a recliner or by putting a few pillows under your head.  Rest as needed. Get at least 8 hours of sleep each night.  Drink enough water to keep your pee (urine) pale yellow.  Eat a healthy diet that includes plenty of vegetables, fruits, whole grains, low-fat dairy products, and lean protein.  Do not use any products that contain nicotine or tobacco. These include cigarettes, e-cigarettes, and chewing tobacco. If you need help quitting, ask your doctor.  Keep all follow-up visits as told by your doctor. This is important. How is this prevented? A shot (vaccine) can help prevent pneumonia. Shots are often suggested for:  People older than 78 years of age.  People older than 78 years of age who: ? Are having cancer treatment. ? Have long-term (chronic) lung disease. ? Have problems with their body's defense system. You may also prevent pneumonia if you take these actions:  Get the flu (influenza) shot every year.  Go to the dentist as often as told.  Wash your hands often. If you cannot use soap and water, use hand sanitizer. Contact a doctor if:  You have a fever.  You lose sleep because your cough medicine does not help. Get help right away if:  You are short of breath and it gets worse.  You have more chest pain.  Your sickness gets worse. This is very serious if: ? You are an older adult. ? Your body's defense system is weak.  You cough up blood. Summary  Pneumonia is an infection of the lungs.  Most adults can be treated at home. Some will need treatment in a hospital.  Drink enough water to keep your pee pale yellow.  Get at least  8 hours of sleep each night. This information is not intended to replace advice given to you by your health care provider. Make sure you discuss any questions you have with your health care provider. Document Revised: 04/02/2019 Document Reviewed: 08/08/2018 Elsevier Patient Education  2020 [employer].   How to Clean a Tracheostomy and Replace Tracheostomy Ties, Adult A tracheostomy, or trach (rhymes with "take"), is a surgically created opening in the trachea that is made in the front of the neck to help with breathing. It is important to keep a trach clean. Doing this helps you:  Keep your skin healthy.  Reduce your risk of infection.  Keep your airway secure. Make sure you follow any specific instructions from your health care provider when you clean a trach  and replace trach ties or a holder. What are the risks? This is a safe process. However, problems may occur, including:  Trach tube coming out.  Airway becoming blocked. Supplies needed:  Clean gloves.  [place] or a Airline pilot.  Scissors.  Container to hold liquid.  Sterile water, tap water, or 0.9% saline solution.  Rolled-up blanket or towel.  Cotton swabs.  4 x 4 inch (10 x 10 cm) gauze pads.  Pre-cut pads or gauze for under the faceplate.  Clamp or tweezers. Getting ready  If available, have a second person helping you (helper).  Have all supplies ready and available.  Wash your hands, and have your helper wash his or her hands.  Put on clean gloves, and have your helper put on clean gloves.  If using trach ties: ? Measure a length of tie that can go around the neck twice. ? Cut the end of the tie on a diagonal to a point. This will make it easier to thread through the opening of the faceplate. ? It is important to replace trach ties regularly to keep them from getting damaged or dirty.  If using a trach holder, open the packaging. How to clean a trach and replace trach  ties Cleaning the trach 1. Fill a container with the water or 0.9% saline solution. 2. Have the person tip his or her head back a little. This will make it easier to see and clean the opening in his or her neck. 3. Place a rolled-up blanket or towel under or behind the person's shoulders. 4. If there are any pads under the faceplate, remove them. 5. Wet the cotton swabs with the water or 0.9% saline solution. 6. Clean the trach site, surrounding skin, and neck. 7. Allow the skin to dry. You may pat it dry with a dry 4 x 4 inch (10 x 10 cm) gauze. 8. Replace the trach pads with a pre-cut pad or gauze. Replacing the ties or holder If trach ties are used: 1. While you hold the trach tube, have your helper cut or loosen the ties that are in place. Make sure the tube (trach) stays in place until the new ties are secured. (If working alone, do not remove the old ties until the new ties are secured.) 2. Use a clamp or tweezers, if needed, to thread one end of the new tie through the faceplate or trach flange. Pull through until the ends are even. 3. Bring ties around the back of the neck, then thread one tie through the opposite hole in the faceplate. 4. Gently pull the ties to secure the trach. 5. Secure the tie with a double square knot. For comfort, make sure you can fit 1-2 finger widths between the neck and the tie. If a trach holder is used:  Have one person hold the trach tube while the other person changes the holder. Make sure the trach tube stays in place until the new ties are secured. (If working alone, apply and secure the new holder before you remove the dirty holder.)  Follow these steps to replace the holder: 1. Position the strap around the back of the neck. 2. Slide the narrow ends under and through the faceplate. 3. Gently pull the ends of the strap so they are tight and secured. For comfort, make sure you can fit 1-2 finger widths between the neck and the strap. Finishing the  process  1. Throw away any used supplies. 2. Remove your  gloves, and have your helper remove his or her gloves. 3. Wash your hands, and have your helper wash his or her hands. Summary  A tracheostomy, or trach, is a surgically created opening in the trachea that is made in the front of the neck to help with breathing.  Make sure you follow any specific instructions from your health care provider when you clean a trach and replace trach ties or a holder.  Have all supplies ready before you begin cleaning your trach and replacing your trach ties.  Be sure to wash your hands, and have your helper wash his or her hands throughout care as directed by your health care provider. This information is not intended to replace advice given to you by your health care provider. Make sure you discuss any questions you have with your health care provider. Document Revised: 09/09/2018 Document Reviewed: 09/09/2018 Elsevier Patient Education  2020 [employer]. Information on my medicine - ELIQUIS (apixaban)  This medication education was reviewed with me or my healthcare representative as part of my discharge preparation.  The pharmacist that spoke with me during my hospital stay was:  Arvilla Meres, [facility]  Why was Eliquis prescribed for you? Eliquis was prescribed to treat blood clots that may have been found in the veins of your legs (deep vein thrombosis) or in your lungs (pulmonary embolism) and to reduce the risk of them occurring again.  What do You need to know about Eliquis ? The dose is ONE 5 mg tablet taken TWICE daily.  Eliquis may be taken with or without food.   Try to take the dose about the same time in the morning and in the evening. If you have difficulty swallowing the tablet whole please discuss with your pharmacist how to take the medication safely.  Take Eliquis exactly as prescribed and DO NOT stop taking Eliquis without talking to the doctor who prescribed the medication.   Stopping may increase your risk of developing a new blood clot.  Refill your prescription before you run out.  After discharge, you should have regular check-up appointments with your healthcare provider that is prescribing your Eliquis.    What do you do if you miss a dose? If a dose of ELIQUIS is not taken at the scheduled time, take it as soon as possible on the same day and twice-daily administration should be resumed. The dose should not be doubled to make up for a missed dose.  Important Safety Information A possible side effect of Eliquis is bleeding. You should call your healthcare provider right away if you experience any of the following: ? Bleeding from an injury or your nose that does not stop. ? Unusual colored urine (red or dark brown) or unusual colored stools (red or black). ? Unusual bruising for unknown reasons. ? A serious fall or if you hit your head (even if there is no bleeding).  Some medicines may interact with Eliquis and might increase your risk of bleeding or clotting while on Eliquis. To help avoid this, consult your healthcare provider or pharmacist prior to using any new prescription or non-prescription medications, including herbals, vitamins, non-steroidal anti-inflammatory drugs (NSAIDs) and supplements.  This website has more information on Eliquis (apixaban): [email or web address]://[email or web address]

## 2020-02-21 NOTE — Consult Note (Signed)
[facility]       Reason for Consult: hypoxia    Referring Physician: Dr. Karleen Hampshire  Principal Problem:   Acute GI bleeding Active Problems:   Essential hypertension   Pulmonary embolism (HCC)   Multifocal pneumonia   Hyponatremia   Hyperglycemia   Pressure injury of skin   . apixaban  5 mg Oral BID  . brimonidine  1 drop Both Eyes BID  . budesonide (PULMICORT) nebulizer solution  0.5 mg Nebulization BID  . dorzolamide  1 drop Both Eyes BID  . feeding supplement (ENSURE ENLIVE)  237 mL Oral TID BM  . feeding supplement (PRO-STAT SUGAR FREE 64)  30 mL Oral BID  . guaiFENesin  600 mg Oral BID  . insulin aspart  0-9 Units Subcutaneous Q4H  . latanoprost  1 drop Both Eyes QHS  . methylPREDNISolone (SOLU-MEDROL) injection  40 mg Intravenous Q12H  . midodrine  10 mg Oral BID WC  . multivitamin with minerals  1 tablet Oral Daily  . pantoprazole (PROTONIX) IV  40 mg Intravenous Q12H  . polyethylene glycol  17 g Oral Daily  . potassium chloride  40 mEq Oral Daily  . senna-docusate  2 tablet Oral BID  . sodium chloride flush  3 mL Intravenous Q12H  . sodium chloride flush  3 mL Intravenous Q12H  . timolol  1 drop Both Eyes BID  . white petrolatum   Topical BID    Recommendations: Sputum for AFB, fungal, bacterial, Pneumocystis Consider BAL Isolation for rule out Tb Will continue off of antibiotics for now  Assessment: He has recent COVID infection in January requiring hospitalization with associated pneumonia and now with worsening sob, worsening pulmonary bilateral ground glass opacities and new cavitary area of RUL.  Differential is broad and does include Tb so I have recommended isolation until we can rule it out.  Ideally, BAL will get a good sample if felt appropriate by CCM.     Antibiotics: Cefepime and azithromycin  HPI: Bruce Webb is a 79 y.o. male originally from [place] and in the [place] since 2010 who had COVID-19 pneumonia last month,  complicated by a small PE and now came in with sob, cough and hypoxia.  He continues on O2 support and requiring 10 L HF.  He desats with movement.  He received decadron for about 10 days.  Has not improved much since admission on 2/21.  Has had melena on eliquis but stable Hgb now.  Unable to do EGD with respiratory status.  Procalcitonin minimally up.  At rest he feels fine.  Leukocytosis, on steroids now.    Review of Systems:  Constitutional: negative for fevers and chills Gastrointestinal: negative for diarrhea All other systems reviewed and are negative    Past Medical History:  Diagnosis Date  . Hypertension     Social History   Tobacco Use  . Smoking status: Never Smoker  . Smokeless tobacco: Never Used  Substance Use Topics  . Alcohol use: Not on file  . Drug use: Not on file    Family History  Problem Relation Age of Onset  . Diabetes Neg Hx     No Known Allergies  Physical Exam: Constitutional: in no apparent distress  Vitals:   02/21/20 0657 02/21/20 0733  BP: 119/66   Pulse: (!) 58   Resp: 17   Temp: 98.7 F (37.1 C) 98.7 F (37.1 C)  SpO2: 95%    EYES: anicteric Pulmonary: speaking in full  sentences, no respiratory distress Skin: negatives: no rash Neuro: non-focal  Lab Results  Component Value Date   WBC 28.1 (H) 02/21/2020   HGB 9.1 (L) 02/21/2020   HCT 27.9 (L) 02/21/2020   MCV 85.8 02/21/2020   PLT 360 02/21/2020    Lab Results  Component Value Date   CREATININE 1.10 02/20/2020   BUN 30 (H) 02/20/2020   NA 137 02/20/2020   K 4.6 02/20/2020   CL 103 02/20/2020   CO2 26 02/20/2020    Lab Results  Component Value Date   ALT 169 (H) 02/19/2020   AST 189 (H) 02/19/2020   ALKPHOS 147 (H) 02/19/2020     Microbiology: Recent Results (from the past 240 hour(s))  Culture, blood (routine x 2)     Status: None (Preliminary result)   Collection Time: 02/15/20  6:30 PM   Specimen: BLOOD  Result Value Ref Range Status   Specimen  Description BLOOD SITE NOT SPECIFIED  Final   Special Requests   Final    BOTTLES DRAWN AEROBIC AND ANAEROBIC Blood Culture adequate volume   Culture NO GROWTH 4 DAYS  Final   Report Status PENDING  Incomplete  Culture, blood (routine x 2)     Status: None (Preliminary result)   Collection Time: 02/15/20  6:45 PM   Specimen: BLOOD  Result Value Ref Range Status   Specimen Description BLOOD SITE NOT SPECIFIED  Final   Special Requests   Final    BOTTLES DRAWN AEROBIC AND ANAEROBIC Blood Culture adequate volume   Culture NO GROWTH 4 DAYS  Final   Report Status PENDING  Incomplete    Thayer Headings, MD [email or web address] 02/21/2020, 10:50 AM

## 2020-02-21 NOTE — Progress Notes (Signed)
PROGRESS NOTE    Bruce Webb  BPZ:[id number] DOB: 10/08/1941 DOA: 02/15/2020 PCP: Ignatius Specking, MD  Brief Narrative:  79 -year-old gentleman with  prior history of essential hypertension, type 2 diabetes mellitus, recent COVID-19 illness initially diagnosed on 01/16/2020, left eye vision loss, recent discharge from the hospital on 01/31/2020 on 2 L of nasal cannula oxygen, pulmonary embolism presents to ED with worsening shortness of breath and worsening vision loss in his right eye.  Of note patient was seen by  ophthalmology on 02/10/2020 and was diagnosed with end-stage glaucoma of both eyes despite maximal treatment with multiple eyedrops. On arrival to ED his chest x-ray was found to have bilateral infiltrates and elevated WBC count of 28,000's.  He was also found to have anemia with a hemoglobin of 6, underwent 2 units of PRBC transfusion.  He was started on IV Protonix for possible blood loss anemia and IV antibiotics for superimposed multifocal pneumonia.  Gastroenterology was consulted and initially was scheduled for endoscopy but in view of his tenuous respiratory status endoscopy was canceled.  Patient is currently on IV heparin and there have been no signs of bleeding or melanotic stools.  Discussed with Dr. Elnoria Howard since his hemoglobin has been seen stable, recommended to  transition to  Eliquis and watch for any signs of bleeding and Outpatient follow-up with gastroenterology for EGD. Hospital course was complicated by acute on chronic diastolic heart failure (for which he was appropriately diuresed), hypotension and lactic acidosis.  PCCM was consulted for persistent hypoxia.HRCT was done on 2/27 showing Significant interval increase in extensive bilateral heterogeneous and ground-glass airspace opacity, particularly in the lower lungs, consistent with worsened multifocal infection and/or ARDS. There is a new area of very dense consolidation of the right upper lobe and a new cavitary lesion of  the anterior right upper lobe measuring 3.2 x 2.8 cm. Findings are concerning for superimposed secondary infection.  Infectious disease consulted to rule out tuberculosis. Patient continues to require up to 10 L of high flow nasal cannula oxygen.     Assessment & Plan:   Principal Problem:   Acute GI bleeding Active Problems:   Essential hypertension   Pulmonary embolism (HCC)   Pneumonia with cavity of lung   Hyponatremia   Hyperglycemia   Pressure injury of skin   Melena, acute blood loss anemia Suspicious for upper GI bleed.  GI was consulted and he was initially scheduled to have an upper endoscopy but was canceled as he had worsening oxygen requirement.   Patient underwent  2 units of PRBC transfusion and patient's hemoglobin has been stable around 8 since then.  On further discussion with Dr. Elnoria Howard, recommended to transition IV heparin to oral Eliquis for his pulmonary embolism and watch for signs of bleeding.  Transition IV PPI to oral PPI twice daily.    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 Patient was recently admitted for severe hypoxic respiratory failure from Covid illness and was discharged on 01/31/2020 on 2 L of oxygen. He was readmitted for secondary/superimposed bacterial pneumonia with leukocytosis and elevated procalcitonin. He was started on broad-spectrum IV antibiotics (IV cefepime and IV azithromycin).  IV azithromycin was discontinued after 3 days. PCCM consulted for persistent hypoxia, leukocytosis of greater than 20,000, lactic acidosis and hypotension. High-resolution CT of the chest showed on 2/27 showed increase in extensive bilateral heterogeneous groundglass opacities new area of  very dense consolidation of the right upper lobe and a new cavitary lesion of the anterior right upper lobe. ID consulted for  evaluation of tuberculosis.  QuantiFERON gold ordered and pending. Further recommendations from PCCM to follow     Acute  diastolic heart failure His BNP was elevated at 234.8 and his echocardiogram showed Left ventricular ejection fraction, by estimation, is 60 to 65%. The left ventricle has no regional wall motion abnormalities. Left ventricular diastolic parameters are consistent with Grade II diastolic  dysfunction (pseudonormalization). He was started on IV Lasix , was appropriately diuresed.  Lasix was discontinued for hypotension. Currently  patient appears euvolemic    Pulmonary embolism Patient was initially started on IV heparin, transition to Eliquis. No signs of bleeding at this time.   Elevated liver enzymes Patient denies any nausea or vomiting or abdominal pain, has good appetite. Repeat liver panel probably secondary to congestion from acute diastolic heart failure vs ? Covid.  Ultrasound abdomen ordered for further evaluation..  Total bilirubin within normal limits,  INR wnl.  Improving liver enzymes   Mild hyponatremia Resolved   Hypokalemia Replaced.     Essential hypertension Well-controlled blood pressure parameters    Hypertension Probably from aggressive diuresis.  Blood pressure improved with fluid bolus.  Constipation Senna Colace and MiraLAX ordered, 2 bowel movements since yesterday.   Acute urinary retention Unclear etiology , in an dout done yesterday.  Frequent bladder scans and watch urine output.    DVT prophylaxis: Heparin Code Status: Full code Family Communication: Discussed with daughter at bedside. On 2 /27  Disposition Plan:  Pt from home, PT eval recommending SNF VS home with home health and 24 hours supervision.  Pt not stable discharge yet, work up for acute respiratory failure with hypoxia is pending.    Consultants:   PCCM   ID  Gastroenterology Dr Elnoria Howard   Procedures: HRCT  Antimicrobials: Anti-infectives  (From admission, onward)   Start     Dose/Rate Route Frequency Ordered Stop   02/19/20 2230  ceFEPIme (MAXIPIME) 2 g in sodium chloride 0.9 % 100 mL IVPB     2 g 200 mL/hr over 30 Minutes Intravenous Every 24 hours 02/19/20 1750 02/20/20 2313   02/19/20 2221  ceFEPIme (MAXIPIME) 2 g in sodium chloride 0.9 % 100 mL IVPB  Status:  Discontinued     2 g 200 mL/hr over 30 Minutes Intravenous Every 24 hours 02/19/20 0916 02/19/20 1750   02/17/20 0000  azithromycin (ZITHROMAX) 500 mg in sodium chloride 0.9 % 250 mL IVPB     500 mg 250 mL/hr over 60 Minutes Intravenous Every 24 hours 02/16/20 0024 02/20/20 0150   02/16/20 1800  cefTRIAXone (ROCEPHIN) 2 g in sodium chloride 0.9 % 100 mL IVPB  Status:  Discontinued     2 g 200 mL/hr over 30 Minutes Intravenous Every 24 hours 02/16/20 0024 02/16/20 1312   02/16/20 1330  ceFEPIme (MAXIPIME) 2 g in sodium chloride 0.9 % 100 mL IVPB  Status:  Discontinued     2 g 200 mL/hr over 30 Minutes Intravenous Every 12 hours 02/16/20 1325 02/19/20 0916   02/15/20 1745  cefTRIAXone (ROCEPHIN) 1 g in sodium chloride 0.9 % 100 mL IVPB     1 g 200 mL/hr over 30 Minutes Intravenous  Once 02/15/20 1742 02/15/20 2126   02/15/20 1745  azithromycin (ZITHROMAX) 500 mg in sodium chloride 0.9 % 250 mL IVPB     500 mg 250 mL/hr over 60 Minutes Intravenous  Once 02/15/20 1742 02/16/20 0134      Subjective: No new complaints.  Patient still on 12 L of high flow nasal cannula oxygen.  Objective: Vitals:   02/21/20 0930 02/21/20 1114 02/21/20 1600 02/21/20 1658  BP:  108/72  134/85  Pulse:    82  Resp:      Temp:  97.8 F (36.6 C)    TempSrc:  Oral    SpO2: 92%  97% 90%  Weight:      Height:        Intake/Output Summary (Last 24 hours) at 02/21/2020 1703 Last data filed at 02/21/2020 1700 Gross per 24 hour  Intake 440 ml  Output 725 ml  Net -285 ml   Filed Weights   02/19/20 0659 02/20/20 0411 02/21/20 0421  Weight: 49.6 kg 50.3 kg 52.3 kg     Examination:  General exam: Frail ill-appearing gentleman on 12 L of high flow nasal cannula oxygen, appears comfortable Respiratory system: Tachypnea present, decreased air entry throughout the lungs, no wheezing heard Cardiovascular system: S1-S2 heard, regular rate rhythm, no JVD, appears euvolemic, no pedal edema Gastrointestinal system: Abdomen is soft, nontender, nondistended, bowel sounds normal Central nervous system: Alert and oriented and answering all questions appropriately, following commands extremities: No pedal edema Skin: Nasal bridge skin tear from nasal prongs use Psychiatry: Mood is appropriate    Data Reviewed: I have personally reviewed following labs and imaging studies  CBC: Recent Labs  Lab 02/15/20 1640 02/16/20 1019 02/17/20 0715 02/18/20 0505 02/19/20 0315 02/20/20 0509 02/21/20 0517  WBC 28.1*   < > 24.9* 29.4* 26.2* 25.4* 28.1*  NEUTROABS 23.2*  --   --   --   --   --   --   HGB 6.0*   < > 8.5* 8.3* 8.2* 8.2* 9.1*  HCT 19.0*   < > 25.8* 24.4* 25.0* 26.1* 27.9*  MCV 87.2   < > 84.0 82.7 85.9 87.0 85.8  PLT 266   < > 214 227 249 293 360   < > = values in this interval not displayed.   Basic Metabolic Panel: Recent Labs  Lab 02/17/20 0715 02/17/20 1646 02/18/20 0505 02/19/20 0315 02/20/20 1045  NA 133* 131* 131* 135 137  K 3.6 3.3* 3.0* 4.1 4.6  CL 102 97* 98 99 103  CO2 20* 23 25 27 26   GLUCOSE 141* 256* 154* 117* 180*  BUN 14 13 26* 28* 30*  CREATININE 0.91 1.04 1.00 1.07 1.10  CALCIUM 7.3* 7.6* 7.3* 7.7* 8.1*  MG  --   --  1.7  --  2.0   GFR: Estimated Creatinine Clearance: 40.9 mL/min (by C-G formula based on SCr of 1.1 mg/dL). Liver Function Tests: Recent Labs  Lab 02/16/20 0541 02/17/20 0715 02/19/20 0315 02/21/20 1205  AST 119* 186* 189* 77*  ALT 120* 158* 169* 116*  ALKPHOS 130* 161* 147* 167*  BILITOT 1.3* 1.1 0.8 0.3  PROT 5.0* 4.6* 5.1* 6.4*  ALBUMIN 1.5* 1.3* 1.3* 1.3*   No results for input(s): LIPASE,  AMYLASE in the last 168 hours. No results for input(s): AMMONIA in the last 168 hours. Coagulation Profile: Recent Labs  Lab 02/15/20 1845 02/19/20 1635  INR 1.7* 1.2   Cardiac Enzymes: No results for input(s): CKTOTAL, CKMB, CKMBINDEX, TROPONINI in the last 168 hours. BNP (last 3 results) No results for input(s): PROBNP in the last 8760 hours. HbA1C: No results for input(s): HGBA1C in the last 72 hours. CBG: Recent Labs  Lab 02/20/20  1948 02/21/20 0020 02/21/20 0408 02/21/20 0730 02/21/20 1110  GLUCAP 294* 194* 154* 184* 248*   Lipid Profile: No results for input(s): CHOL, HDL, LDLCALC, TRIG, CHOLHDL, LDLDIRECT in the last 72 hours. Thyroid Function Tests: No results for input(s): TSH, T4TOTAL, FREET4, T3FREE, THYROIDAB in the last 72 hours. Anemia Panel: Recent Labs    02/20/20 1045  FERRITIN 403*   Sepsis Labs: Recent Labs  Lab 02/15/20 1845 02/16/20 0541 02/17/20 0715 02/19/20 1202 02/19/20 1635  PROCALCITON  --  0.66 0.50  --  0.99  LATICACIDVEN 1.8  --   --  2.4* 1.9    Recent Results (from the past 240 hour(s))  Culture, blood (routine x 2)     Status: None (Preliminary result)   Collection Time: 02/15/20  6:30 PM   Specimen: BLOOD  Result Value Ref Range Status   Specimen Description BLOOD SITE NOT SPECIFIED  Final   Special Requests   Final    BOTTLES DRAWN AEROBIC AND ANAEROBIC Blood Culture adequate volume   Culture NO GROWTH 4 DAYS  Final   Report Status PENDING  Incomplete  Culture, blood (routine x 2)     Status: None (Preliminary result)   Collection Time: 02/15/20  6:45 PM   Specimen: BLOOD  Result Value Ref Range Status   Specimen Description BLOOD SITE NOT SPECIFIED  Final   Special Requests   Final    BOTTLES DRAWN AEROBIC AND ANAEROBIC Blood Culture adequate volume   Culture NO GROWTH 4 DAYS  Final   Report Status PENDING  Incomplete         Radiology Studies: CT Chest High Resolution  Result Date: 02/20/2020 CLINICAL  DATA:  Hypoxemia EXAM: CT CHEST WITHOUT CONTRAST TECHNIQUE: Multidetector CT imaging of the chest was performed following the standard protocol without intravenous contrast. High resolution imaging of the lungs, as well as inspiratory and expiratory imaging, was performed. COMPARISON:  Chest radiographs, 02/16/2020, CT chest angiogram, 01/27/2020 FINDINGS: Cardiovascular: No significant vascular findings. Normal heart size. No pericardial effusion. Mediastinum/Nodes: No enlarged mediastinal, hilar, or axillary lymph nodes. Thyroid gland, trachea, and esophagus demonstrate no significant findings. Lungs/Pleura: New small bilateral pleural effusions. Significant interval increase in extensive bilateral heterogeneous and ground-glass airspace opacity, particularly in the lower lungs. There is a new area of very dense consolidation of the right upper lobe and a new cavitary lesion of the anterior right upper lobe measuring 3.2 x 2.8 cm (series 5, image 57). Mild underlying centrilobular emphysema. Upper Abdomen: No acute abnormality. Musculoskeletal: No chest wall mass or suspicious bone lesions identified. IMPRESSION: 1. Significant interval increase in extensive bilateral heterogeneous and ground-glass airspace opacity, particularly in the lower lungs, consistent with worsened multifocal infection and/or ARDS. 2. There is a new area of very dense consolidation of the right upper lobe and a new cavitary lesion of the anterior right upper lobe measuring 3.2 x 2.8 cm. Findings are concerning for superimposed secondary infection. 3.  New small bilateral pleural effusions. 4.  Emphysema (ICD10-J43.9). Electronically Signed   By: Eddie Candle M.D.   On: 02/20/2020 18:57   US Abdomen Limited  Result Date: 02/19/2020 CLINICAL DATA:  Elevated LFTs EXAM: ULTRASOUND ABDOMEN LIMITED RIGHT UPPER QUADRANT COMPARISON:  None. FINDINGS: Gallbladder: No gallstones or wall thickening visualized. No sonographic Murphy sign noted by  sonographer. Common bile duct: Diameter: 5 mm Liver: No focal lesion identified. Within normal limits in parenchymal echogenicity. Portal vein is patent on color Doppler imaging with normal direction of blood flow  towards the liver. Other: None. IMPRESSION: No acute abnormality. No specific sonographic abnormality identified to explain the patient's elevated LFTs. Electronically Signed   By: Katherine Mantle M.D.   On: 02/19/2020 19:21        Scheduled Meds: . apixaban  5 mg Oral BID  . brimonidine  1 drop Both Eyes BID  . budesonide (PULMICORT) nebulizer solution  0.5 mg Nebulization BID  . dorzolamide  1 drop Both Eyes BID  . feeding supplement (ENSURE ENLIVE)  237 mL Oral TID BM  . feeding supplement (PRO-STAT SUGAR FREE 64)  30 mL Oral BID  . guaiFENesin  600 mg Oral BID  . insulin aspart  0-9 Units Subcutaneous Q4H  . latanoprost  1 drop Both Eyes QHS  . methylPREDNISolone (SOLU-MEDROL) injection  40 mg Intravenous Q12H  . midodrine  10 mg Oral BID WC  . multivitamin with minerals  1 tablet Oral Daily  . pantoprazole (PROTONIX) IV  40 mg Intravenous Q12H  . polyethylene glycol  17 g Oral Daily  . potassium chloride  40 mEq Oral Daily  . senna-docusate  2 tablet Oral BID  . sodium chloride flush  3 mL Intravenous Q12H  . sodium chloride flush  3 mL Intravenous Q12H  . timolol  1 drop Both Eyes BID  . white petrolatum   Topical BID   Continuous Infusions: . sodium chloride       LOS: 6 days        Kathlen Mody, MD [facility]   To contact the attending provider between 7A-7P or the covering provider during after hours 7P-7A, please log into the web site [email or web address] and access using universal [place] password for that web site. If you do not have the password, please call the hospital operator.  02/21/2020, 5:03 PM

## 2020-02-21 NOTE — Consult Note (Addendum)
NAME:  Bruce Webb, MRN:  [id number], DOB:  Jan 21, 1941, LOS: 6 ADMISSION DATE:  02/15/2020, CONSULTATION DATE:  02/19/2020 REFERRING MD:  Dr. Karleen Hampshire, CHIEF COMPLAINT:  Hypotension/ hypoxia    Brief History   79 year old male post COVID and PE in January on Eliquis admitted 2/21 to [facility] for extertional dyspnea found to be anemic with hemoccult positive stools.  GI consulted and transfused PRBC with since stabilized Hgb but has had persistent higher O2 requirements.  Was diuresed but today developed hypotension, with ongoing higher O2 requirements, PCCM was asked to consult.     History of present illness   HPI obtained via interpreter and from daughter.   79 year old male, who speaks [place], with history of HTN, HFpEF, DMT2, glaucoma with recent COVID 01/16/2020 with subsequent subacute PE on Eliquis and home O2 2L [place] who presented 2/21 with exertional dyspnea, black tarry stools, and worsening vision loss in his right eye.  Patient lives at home with his daughter and pre-COVID was independent in his ADLs.  He is a retired and prior worked in an office.  Denies any smoking or pulmonary history.  Reports prior to COVID, had a chronic non-productive cough for 10-15 years and his primary care MD treated it occasionally with cough medicine.   Patient with recent hospitalization at [facility] for [place] after initial positive 1/22 for failed outpatient treatment for COVID infection despite monoclonal antibodies and steroids.  While at [facility], treated with remdesivir and decadron and found to have a PE and was discharged home on 2/6 on 2L O2 and Eliquis.   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    On admit, found to have worsening anemia, 8.8-> 6 with positive fecal occult blood testing and hypotension responsive to fluid.  Additionally found to have WBC 28k, CXR with worsening multifocal airspace consolidation bilaterally.  He was started  empirically on zithromax and cefepime. PCT 0.5.  He was transfused with 2 units PRBC, started on protonix, and GI consulted. However, given tenuous respiratory status and increased O2 needs, GI deferred EGD.  Hgb trend since stable and therefore placed on heparin gtt on 2/23.  He was diuresed on 2/23 given fluid overload post blood transfusion with good output then but has since required midodrine to help with soft blood pressures.  On 2/25, he had worsening hypotension despite midodrine, ongoing elevated O2 needs, and transaminitis, PCCM asked to consult.     Past Medical History  HTN, HFpEF, DMT2, glaucoma with progressive vision loss (left, now rapid loss in right) - recent COVID 01/16/2020 with subsequent PE on Eliquis and O2 2L [place]  Significant Hospital Events   2/21 admit 2/25 PCCM consulted for hypotension and persistent hypoxemia  2/26 - Has severe desaturations with activity and eating. Required 10L while eating his meal today. No acute distress but continues to have shortness of breath. Denies chest pain.    Consults:  GI  PCCM  Procedures:   Significant Diagnostic Tests:  2/21 CTH >> 1. No acute intracranial abnormalities. 2. Mild cerebral atrophy with chronic microvascular ischemic changes in the cerebral white matter, as above.  CTA 01/27/20 - Diffuse subpleural/peripheral interstitial ground glass opacities with increased involvement in the the lower lobes bilaterally R>L. CXR 02/16/20 R>L multifocal pneumonia R>L, no edema or effusion  2/23 TTE >> 1. Left ventricular ejection fraction, by estimation, is 60 to 65%. The left ventricle has normal function. The left ventricle has no  regional wall motion abnormalities. Left ventricular diastolic parameters are consistent with Grade II diastolic dysfunction (pseudonormalization).  2. The history indicates the patient has a pulmonary embolus. The RV is  not dilated and there is normal RV function. . Right ventricular systolic  function is low normal. The right ventricular size is normal.  3. The mitral valve is normal in structure and function. Mild mitral valve regurgitation. No evidence of mitral stenosis.  4. The aortic valve is normal in structure and function. Aortic valve regurgitation is not visualized. No aortic stenosis is present.   Micro Data:  2/21 BCx 2 >> ngtd x 4 days  Antimicrobials:  2/21 azithro>> 2/21 ceftriaxone  2/22 cefepime >>  Interim history/subjective:    2/27 -remains on 10 L.  He easily desaturates.  [name] at the bedside.  I spoke to her and subsequently also to their family friend who is in physician assistant in cardiology [place]).  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  According to the daughter he got worse with COVID-19 then got better went home but he declined again in terms of his hypoxemia.  I personally visualized the current CT scan of the chest compared to 4 weeks ago and to my visualization it looks like an extension of his pulmonary infiltrates from the original COVID-19 admission including a right right upper lobe anterior segment cavity thick-walled.  Objective   Blood pressure 108/72, pulse (!) 58, temperature 97.8 F (36.6 C), temperature source Oral, resp. rate 17, height 5\' 2"  (1.575 m), weight 52.3 kg, SpO2 92 %.        Intake/Output Summary (Last 24 hours) at 02/21/2020 1513 Last data filed at 02/21/2020 3762 Gross per 24 hour  Intake 440 ml  Output 475 ml  Net -35 ml   Filed Weights   02/19/20 0659 02/20/20 0411 02/21/20 0421  Weight: 49.6 kg 50.3 kg 52.3 kg    General Appearance:  Looks chronic ill, cachectic, deconditioned Head:  Normocephalic, without obvious abnormality, atraumatic Eyes:  PERRL - yes, conjunctiva/corneas - muddy     Ears:  Normal external ear canals, both ears Nose:  G tube - no but  Has [place] o2 Throat:  ETT TUBE - no , OG tube - no Neck:  Supple,  No  enlargement/tenderness/nodules Lungs: Clear to auscultation bilaterally, TAchypneic, mildly labored O2 +, Mild acc muscule use - sentenes + - recent baseline Heart:  S1 and S2 normal, no murmur, CVP - no.  Pressors - no Abdomen:  Soft, no masses, no organomegaly Genitalia / Rectal:  Not done Extremities:  Extremities- intact Skin:  ntact in exposed areas . Neurologic:  Sedation - none -> RASS - +1 . Moves all 4s - yes. CAM-ICU - neg . Orientation - x3+       Resolved Hospital Problem list    Assessment & Plan:  79 year old male never smoker with prior COVID-19 infection diagnosed in January and requiring hospitalization from 2/1-2/6, recently diagnosed pulmonary emboli on anticoagulation and end-stage glaucoma with right vision loss in addition to his known left vision loss who presents with worsening shortness of breath and admitted for acute on chronic respiratory failure secondary to multifocal pneumonia. His oxygen requirement has increased 10L and now requiring face mask due to nasal discomfort. He has been adequately diuresed for possible heart failure exacerbation and currently net negative 2L. PCCM was consulted for hypotension in the last 24 hours and  persistent hypoxemia.  I suspect his hypoxemia is multifactorial. His heart failure appears adequately treated and patient is euvolemic on exam. Hypotension did resolve with 500cc IVF bolus. On review of his chest imaging, CXR demonstrates interval worsening of peripheral reticular opacities concerning for fibrosis or post-inflammatory viral process in additional lobar pneumonia. His severe deconditioning also is playing a role in his respiratory failure.  Worsening hypoxemia with any exertion requiring 10-12L O2 via [place].  Acute hypoxemic respiratory failure secondary to post-inflammatory COVID-19 infection vs pulmonary fibrosis, atelectasis, pulmonary emboli.   02/21/2020 -admission CT scan of the chest suggest extension of the  COVID-19 inflammatory process along with a new onset of right upper lobe anterior segment cavity in the subpleural area.  Though he is from a endemic area for tuberculosis clinically this looks like an extension of Covid related inflammation and necrotizing process.  Procalcitonin 0.5 range suggest possible localized bacterial process as well which is common in the post Covid setting  Infectious diseases asking pulmonary to consider bronchoscopy with lavage.  However 10 L of oxygen moderate sedation can be risky and deep sedation would require intubation which puts him at high risk for failure to extubate  Plan -Check urine Streptococcus and urine Legionella -Check respiratory virus panel -Check sedimentation rate for possibility of associated organizing pneumonia -Check ANA, double-stranded DNA, rheumatoid factor, CCP and vasculitis serology such as MPO/PR-3 and GBM antibody -Await QuantiFERON gold - Check Aspergillus Ab  -Decisions on bronchoscopy to be made after this.  And depending on the course  -Continue cefepime for now -Continue Solu-Medrol 40 mg twice daily for now -Continue pulmonary toilet    Best practice:  Diet: per RD recs Pain/Anxiety/Delirium protocol (if indicated): n/a VAP protocol (if indicated): n/a DVT prophylaxis: heparin gtt GI prophylaxis: PPI Glucose control: SSI Mobility: push  Code Status: Full  Family Communication: Patient updated at bedside Disposition: 3E/ PCU      SIGNATURE    Dr. Kalman Shan, M.D., F.C.C.P,  Pulmonary and Critical Care Medicine Staff Physician, [facility] Center Director - Interstitial Lung Disease  Program  Pulmonary Fibrosis [facility] at [facility] [place], [place], [address]  Pager: [phone], If no answer or between  15:00h - 7:00h: call [phone] Telephone: [phone]  3:13 PM 02/21/2020     LABS    PULMONARY No results for input(s): PHART, PCO2ART, PO2ART,  HCO3, TCO2, O2SAT in the last 168 hours.  Invalid input(s): PCO2, PO2  CBC Recent Labs  Lab 02/19/20 0315 02/20/20 0509 02/21/20 0517  HGB 8.2* 8.2* 9.1*  HCT 25.0* 26.1* 27.9*  WBC 26.2* 25.4* 28.1*  PLT 249 293 360    COAGULATION Recent Labs  Lab 02/15/20 1845 02/19/20 1635  INR 1.7* 1.2    CARDIAC  No results for input(s): TROPONINI in the last 168 hours. No results for input(s): PROBNP in the last 168 hours.   CHEMISTRY Recent Labs  Lab 02/17/20 0715 02/17/20 0715 02/17/20 1646 02/17/20 1646 02/18/20 0505 02/18/20 0505 02/19/20 0315 02/20/20 1045  NA 133*  --  131*  --  131*  --  135 137  K 3.6   < > 3.3*   < > 3.0*   < > 4.1 4.6  CL 102  --  97*  --  98  --  99 103  CO2 20*  --  23  --  25  --  27 26  GLUCOSE 141*  --  256*  --  154*  --  117* 180*  BUN 14  --  13  --  26*  --  28* 30*  CREATININE 0.91  --  1.04  --  1.00  --  1.07 1.10  CALCIUM 7.3*  --  7.6*  --  7.3*  --  7.7* 8.1*  MG  --   --   --   --  1.7  --   --  2.0   < > = values in this interval not displayed.   Estimated Creatinine Clearance: 40.9 mL/min (by C-G formula based on SCr of 1.1 mg/dL).   LIVER Recent Labs  Lab 02/15/20 1845 02/16/20 0541 02/17/20 0715 02/19/20 0315 02/19/20 1635 02/21/20 1205  AST  --  119* 186* 189*  --  77*  ALT  --  120* 158* 169*  --  116*  ALKPHOS  --  130* 161* 147*  --  167*  BILITOT  --  1.3* 1.1 0.8  --  0.3  PROT  --  5.0* 4.6* 5.1*  --  6.4*  ALBUMIN  --  1.5* 1.3* 1.3*  --  1.3*  INR 1.7*  --   --   --  1.2  --      INFECTIOUS Recent Labs  Lab 02/15/20 1845 02/16/20 0541 02/17/20 0715 02/19/20 1202 02/19/20 1635  LATICACIDVEN 1.8  --   --  2.4* 1.9  PROCALCITON  --  0.66 0.50  --  0.99     ENDOCRINE CBG (last 3)  Recent Labs    02/21/20 0408 02/21/20 0730 02/21/20 1110  GLUCAP 154* 184* 248*         IMAGING x48h  - image(s) personally visualized  -   highlighted in bold CT Chest High Resolution  Result  Date: 02/20/2020 CLINICAL DATA:  Hypoxemia EXAM: CT CHEST WITHOUT CONTRAST TECHNIQUE: Multidetector CT imaging of the chest was performed following the standard protocol without intravenous contrast. High resolution imaging of the lungs, as well as inspiratory and expiratory imaging, was performed. COMPARISON:  Chest radiographs, 02/16/2020, CT chest angiogram, 01/27/2020 FINDINGS: Cardiovascular: No significant vascular findings. Normal heart size. No pericardial effusion. Mediastinum/Nodes: No enlarged mediastinal, hilar, or axillary lymph nodes. Thyroid gland, trachea, and esophagus demonstrate no significant findings. Lungs/Pleura: New small bilateral pleural effusions. Significant interval increase in extensive bilateral heterogeneous and ground-glass airspace opacity, particularly in the lower lungs. There is a new area of very dense consolidation of the right upper lobe and a new cavitary lesion of the anterior right upper lobe measuring 3.2 x 2.8 cm (series 5, image 57). Mild underlying centrilobular emphysema. Upper Abdomen: No acute abnormality. Musculoskeletal: No chest wall mass or suspicious bone lesions identified. IMPRESSION: 1. Significant interval increase in extensive bilateral heterogeneous and ground-glass airspace opacity, particularly in the lower lungs, consistent with worsened multifocal infection and/or ARDS. 2. There is a new area of very dense consolidation of the right upper lobe and a new cavitary lesion of the anterior right upper lobe measuring 3.2 x 2.8 cm. Findings are concerning for superimposed secondary infection. 3.  New small bilateral pleural effusions. 4.  Emphysema (ICD10-J43.9). Electronically Signed   By: Eddie Candle M.D.   On: 02/20/2020 18:57   US Abdomen Limited  Result Date: 02/19/2020 CLINICAL DATA:  Elevated LFTs EXAM: ULTRASOUND ABDOMEN LIMITED RIGHT UPPER QUADRANT COMPARISON:  None. FINDINGS: Gallbladder: No gallstones or wall thickening visualized. No  sonographic Murphy sign noted by sonographer. Common bile duct: Diameter: 5 mm Liver: No focal lesion identified.  Within normal limits in parenchymal echogenicity. Portal vein is patent on color Doppler imaging with normal direction of blood flow towards the liver. Other: None. IMPRESSION: No acute abnormality. No specific sonographic abnormality identified to explain the patient's elevated LFTs. Electronically Signed   By: Constance Holster M.D.   On: 02/19/2020 19:21

## 2020-02-22 ENCOUNTER — Inpatient Hospital Stay: Payer: Self-pay

## 2020-02-22 ENCOUNTER — Inpatient Hospital Stay (HOSPITAL_COMMUNITY): Payer: Medicare Other

## 2020-02-22 DIAGNOSIS — J181 Lobar pneumonia, unspecified organism: Secondary | ICD-10-CM

## 2020-02-22 DIAGNOSIS — R748 Abnormal levels of other serum enzymes: Secondary | ICD-10-CM

## 2020-02-22 DIAGNOSIS — R5383 Other fatigue: Secondary | ICD-10-CM | POA: Diagnosis not present

## 2020-02-22 DIAGNOSIS — K922 Gastrointestinal hemorrhage, unspecified: Secondary | ICD-10-CM

## 2020-02-22 DIAGNOSIS — R7401 Elevation of levels of liver transaminase levels: Secondary | ICD-10-CM

## 2020-02-22 LAB — BLOOD GAS, ARTERIAL
Acid-Base Excess: 1.7 mmol/L (ref 0.0–2.0)
Bicarbonate: 25 mmol/L (ref 20.0–28.0)
FIO2: 100
O2 Saturation: 83.7 %
Patient temperature: 36.4
pCO2 arterial: 33.4 mmHg (ref 32.0–48.0)
pH, Arterial: 7.485 — ABNORMAL HIGH (ref 7.350–7.450)
pO2, Arterial: 47.4 mmHg — ABNORMAL LOW (ref 83.0–108.0)

## 2020-02-22 LAB — COMPREHENSIVE METABOLIC PANEL
ALT: 124 U/L — ABNORMAL HIGH (ref 0–44)
AST: 98 U/L — ABNORMAL HIGH (ref 15–41)
Albumin: 1.5 g/dL — ABNORMAL LOW (ref 3.5–5.0)
Alkaline Phosphatase: 171 U/L — ABNORMAL HIGH (ref 38–126)
Anion gap: 6 (ref 5–15)
BUN: 44 mg/dL — ABNORMAL HIGH (ref 8–23)
CO2: 26 mmol/L (ref 22–32)
Calcium: 8.5 mg/dL — ABNORMAL LOW (ref 8.9–10.3)
Chloride: 102 mmol/L (ref 98–111)
Creatinine, Ser: 0.79 mg/dL (ref 0.61–1.24)
GFR calc Af Amer: >60 mL/min (ref >60)
GFR calc non Af Amer: >60 mL/min (ref >60)
Glucose, Bld: 161 mg/dL — ABNORMAL HIGH (ref 70–99)
Potassium: 4.8 mmol/L (ref 3.5–5.1)
Sodium: 134 mmol/L — ABNORMAL LOW (ref 135–145)
Total Bilirubin: 0.5 mg/dL (ref 0.3–1.2)
Total Protein: 5.6 g/dL — ABNORMAL LOW (ref 6.5–8.1)

## 2020-02-22 LAB — BODY FLUID CELL COUNT WITH DIFFERENTIAL
Lymphs, Fluid: 3 %
Neutrophil Count, Fluid: 97 % — ABNORMAL HIGH (ref 0–25)
Total Nucleated Cell Count, Fluid: 2860 cu mm — ABNORMAL HIGH (ref 0–1000)

## 2020-02-22 LAB — POCT I-STAT 7, (LYTES, BLD GAS, ICA,H+H)
Acid-Base Excess: 5 mmol/L — ABNORMAL HIGH (ref 0.0–2.0)
Acid-Base Excess: 5 mmol/L — ABNORMAL HIGH (ref 0.0–2.0)
Bicarbonate: 31.8 mmol/L — ABNORMAL HIGH (ref 20.0–28.0)
Bicarbonate: 29.9 mmol/L — ABNORMAL HIGH (ref 20.0–28.0)
Calcium, Ion: 1.19 mmol/L (ref 1.15–1.40)
Calcium, Ion: 1.19 mmol/L (ref 1.15–1.40)
HCT: 29 % — ABNORMAL LOW (ref 39.0–52.0)
HCT: 27 % — ABNORMAL LOW (ref 39.0–52.0)
Hemoglobin: 9.9 g/dL — ABNORMAL LOW (ref 13.0–17.0)
Hemoglobin: 9.2 g/dL — ABNORMAL LOW (ref 13.0–17.0)
O2 Saturation: 92 %
O2 Saturation: 95 %
Patient temperature: 99.1
Patient temperature: 97.8
Potassium: 4.7 mmol/L (ref 3.5–5.1)
Potassium: 4.6 mmol/L (ref 3.5–5.1)
Sodium: 137 mmol/L (ref 135–145)
Sodium: 136 mmol/L (ref 135–145)
TCO2: 34 mmol/L — ABNORMAL HIGH (ref 22–32)
TCO2: 31 mmol/L (ref 22–32)
pCO2 arterial: 59.3 mmHg — ABNORMAL HIGH (ref 32.0–48.0)
pCO2 arterial: 45.9 mmHg (ref 32.0–48.0)
pH, Arterial: 7.338 — ABNORMAL LOW (ref 7.350–7.450)
pH, Arterial: 7.419 (ref 7.350–7.450)
pO2, Arterial: 71 mmHg — ABNORMAL LOW (ref 83.0–108.0)
pO2, Arterial: 75 mmHg — ABNORMAL LOW (ref 83.0–108.0)

## 2020-02-22 LAB — GLUCOSE, CAPILLARY
Glucose-Capillary: 150 mg/dL — ABNORMAL HIGH (ref 70–99)
Glucose-Capillary: 161 mg/dL — ABNORMAL HIGH (ref 70–99)
Glucose-Capillary: 179 mg/dL — ABNORMAL HIGH (ref 70–99)
Glucose-Capillary: 202 mg/dL — ABNORMAL HIGH (ref 70–99)
Glucose-Capillary: 210 mg/dL — ABNORMAL HIGH (ref 70–99)
Glucose-Capillary: 289 mg/dL — ABNORMAL HIGH (ref 70–99)

## 2020-02-22 LAB — CBC
HCT: 30.7 % — ABNORMAL LOW (ref 39.0–52.0)
Hemoglobin: 9.7 g/dL — ABNORMAL LOW (ref 13.0–17.0)
MCH: 27.5 pg (ref 26.0–34.0)
MCHC: 31.6 g/dL (ref 30.0–36.0)
MCV: 87 fL (ref 80.0–100.0)
Platelets: 244 10*3/uL (ref 150–400)
RBC: 3.53 MIL/uL — ABNORMAL LOW (ref 4.22–5.81)
RDW: 16.2 % — ABNORMAL HIGH (ref 11.5–15.5)
WBC: 37.8 10*3/uL — ABNORMAL HIGH (ref 4.0–10.5)
nRBC: 0 % (ref 0.0–0.2)

## 2020-02-22 LAB — SEDIMENTATION RATE: Sed Rate: 98 mm/hr — ABNORMAL HIGH (ref 0–16)

## 2020-02-22 LAB — MRSA PCR SCREENING: MRSA by PCR: NEGATIVE

## 2020-02-22 LAB — STREP PNEUMONIAE URINARY ANTIGEN: Strep Pneumo Urinary Antigen: NEGATIVE

## 2020-02-22 MED ORDER — MIDAZOLAM HCL 2 MG/2ML IJ SOLN
1.0000 mg | INTRAMUSCULAR | Status: DC | PRN
  Administered 2020-02-25 – 2020-02-27 (×2): 1 mg via INTRAVENOUS
  Filled 2020-02-22 (×2): qty 2

## 2020-02-22 MED ORDER — SULFAMETHOXAZOLE-TRIMETHOPRIM 400-80 MG/5ML IV SOLN
15.0000 mg/kg/d | Freq: Three times a day (TID) | INTRAVENOUS | Status: DC
  Administered 2020-02-22 – 2020-02-24 (×5): 261.44 mg via INTRAVENOUS
  Filled 2020-02-22 (×9): qty 16.34

## 2020-02-22 MED ORDER — SODIUM CHLORIDE 0.9 % IV SOLN
250.0000 mL | INTRAVENOUS | Status: DC
  Administered 2020-02-23: 250 mL via INTRAVENOUS

## 2020-02-22 MED ORDER — CHLORHEXIDINE GLUCONATE 0.12% ORAL RINSE (MEDLINE KIT)
15.0000 mL | Freq: Two times a day (BID) | OROMUCOSAL | Status: DC
  Administered 2020-02-22 – 2020-03-11 (×37): 15 mL via OROMUCOSAL

## 2020-02-22 MED ORDER — SODIUM BICARBONATE 8.4 % IV SOLN
INTRAVENOUS | Status: AC
  Administered 2020-02-22: 17:00:00 50 meq
  Filled 2020-02-22: qty 50

## 2020-02-22 MED ORDER — FENTANYL CITRATE (PF) 100 MCG/2ML IJ SOLN: 25.0000 ug | Freq: Once | INTRAMUSCULAR | Status: AC

## 2020-02-22 MED ORDER — FENTANYL 2500MCG IN NS 250ML (10MCG/ML) PREMIX INFUSION
25.0000 ug/h | INTRAVENOUS | Status: DC
  Administered 2020-02-22: 18:00:00 25 ug/h via INTRAVENOUS
  Administered 2020-02-24 (×2): 150 ug/h via INTRAVENOUS
  Administered 2020-02-26: 100 ug/h via INTRAVENOUS
  Administered 2020-02-27: 150 ug/h via INTRAVENOUS
  Administered 2020-02-28 – 2020-02-29 (×2): 100 ug/h via INTRAVENOUS
  Filled 2020-02-22 (×9): qty 250

## 2020-02-22 MED ORDER — FENTANYL CITRATE (PF) 100 MCG/2ML IJ SOLN
INTRAMUSCULAR | Status: AC
  Administered 2020-02-22: 17:00:00 50 ug
  Filled 2020-02-22: qty 2

## 2020-02-22 MED ORDER — ROCURONIUM BROMIDE 50 MG/5ML IV SOLN
100.0000 mg | Freq: Once | INTRAVENOUS | Status: AC
  Administered 2020-02-22: 50 mg via INTRAVENOUS

## 2020-02-22 MED ORDER — PHENYLEPHRINE HCL-NACL 10-0.9 MG/250ML-% IV SOLN
25.0000 ug/min | INTRAVENOUS | Status: DC
  Administered 2020-02-22: 60 ug/min via INTRAVENOUS
  Administered 2020-02-22: 30 ug/min via INTRAVENOUS
  Administered 2020-02-23: 85 ug/min via INTRAVENOUS
  Administered 2020-02-23 (×2): 85.333 ug/min via INTRAVENOUS
  Administered 2020-02-23: 03:00:00 110 ug/min via INTRAVENOUS
  Administered 2020-02-23: 100 ug/min via INTRAVENOUS
  Administered 2020-02-23: 70 ug/min via INTRAVENOUS
  Administered 2020-02-23: 110 ug/min via INTRAVENOUS
  Administered 2020-02-23 (×2): 75 ug/min via INTRAVENOUS
  Filled 2020-02-22: qty 250
  Filled 2020-02-22: qty 500
  Filled 2020-02-22 (×3): qty 250
  Filled 2020-02-22 (×2): qty 500
  Filled 2020-02-22: qty 250

## 2020-02-22 MED ORDER — MIDAZOLAM HCL 2 MG/2ML IJ SOLN: 2.0000 mg | Freq: Once | INTRAMUSCULAR | Status: AC

## 2020-02-22 MED ORDER — CHLORHEXIDINE GLUCONATE 0.12 % MT SOLN
15.0000 mL | Freq: Two times a day (BID) | OROMUCOSAL | Status: DC
  Administered 2020-02-22 – 2020-02-23 (×2): 15 mL via OROMUCOSAL
  Filled 2020-02-22: qty 15

## 2020-02-22 MED ORDER — PHENYLEPHRINE HCL-NACL 10-0.9 MG/250ML-% IV SOLN: 0.0000 ug/min | INTRAVENOUS | Status: DC

## 2020-02-22 MED ORDER — PHENYLEPHRINE HCL-NACL 10-0.9 MG/250ML-% IV SOLN
INTRAVENOUS | Status: AC
  Administered 2020-02-22: 50 ug/min
  Filled 2020-02-22: qty 250

## 2020-02-22 MED ORDER — HEPARIN (PORCINE) 25000 UT/250ML-% IV SOLN
750.0000 [IU]/h | INTRAVENOUS | Status: DC
  Administered 2020-02-22 – 2020-02-23 (×2): 1100 [IU]/h via INTRAVENOUS
  Administered 2020-02-24: 1000 [IU]/h via INTRAVENOUS
  Administered 2020-02-25: 900 [IU]/h via INTRAVENOUS
  Administered 2020-02-26: 750 [IU]/h via INTRAVENOUS
  Administered 2020-02-28: 700 [IU]/h via INTRAVENOUS
  Administered 2020-02-29: 600 [IU]/h via INTRAVENOUS
  Administered 2020-03-02: 650 [IU]/h via INTRAVENOUS
  Administered 2020-03-04: 700 [IU]/h via INTRAVENOUS
  Administered 2020-03-05: 11:00:00 750 [IU]/h via INTRAVENOUS
  Filled 2020-02-22 (×10): qty 250

## 2020-02-22 MED ORDER — ORAL CARE MOUTH RINSE
15.0000 mL | Freq: Two times a day (BID) | OROMUCOSAL | Status: DC
  Administered 2020-02-22 (×2): 15 mL via OROMUCOSAL

## 2020-02-22 MED ORDER — MIDAZOLAM HCL 2 MG/2ML IJ SOLN
INTRAMUSCULAR | Status: AC
  Administered 2020-02-22: 17:00:00 2 mg via INTRAVENOUS
  Filled 2020-02-22: qty 2

## 2020-02-22 MED ORDER — FENTANYL BOLUS VIA INFUSION
25.0000 ug | INTRAVENOUS | Status: DC | PRN
  Administered 2020-02-22 – 2020-02-25 (×2): 25 ug via INTRAVENOUS
  Filled 2020-02-22: qty 25

## 2020-02-22 MED ORDER — FENTANYL CITRATE (PF) 100 MCG/2ML IJ SOLN: 25.0000 ug | INTRAMUSCULAR | Status: DC | PRN

## 2020-02-22 MED ORDER — DOCUSATE SODIUM 50 MG/5ML PO LIQD: 100.0000 mg | Freq: Two times a day (BID) | ORAL | Status: DC | PRN

## 2020-02-22 MED ORDER — FENTANYL CITRATE (PF) 100 MCG/2ML IJ SOLN
25.0000 ug | INTRAMUSCULAR | Status: DC | PRN
  Administered 2020-02-28: 100 ug via INTRAVENOUS
  Administered 2020-02-28 (×2): 50 ug via INTRAVENOUS

## 2020-02-22 MED ORDER — PROPOFOL 10 MG/ML IV BOLUS: 1.0000 mg/kg | Freq: Once | INTRAVENOUS | Status: DC

## 2020-02-22 MED ORDER — PROPOFOL 1000 MG/100ML IV EMUL
5.0000 ug/kg/min | INTRAVENOUS | Status: DC
  Administered 2020-02-22: 18:00:00 5 ug/kg/min via INTRAVENOUS
  Administered 2020-02-23: 20 ug/kg/min via INTRAVENOUS
  Administered 2020-02-23: 25 ug/kg/min via INTRAVENOUS
  Administered 2020-02-24: 10 ug/kg/min via INTRAVENOUS
  Administered 2020-02-24 – 2020-02-25 (×2): 25 ug/kg/min via INTRAVENOUS
  Filled 2020-02-22 (×6): qty 100

## 2020-02-22 MED ORDER — CHLORHEXIDINE GLUCONATE CLOTH 2 % EX PADS
6.0000 | MEDICATED_PAD | Freq: Every day | CUTANEOUS | Status: DC
  Administered 2020-02-22 – 2020-03-15 (×23): 6 via TOPICAL

## 2020-02-22 MED ORDER — FENTANYL CITRATE (PF) 100 MCG/2ML IJ SOLN: 100.0000 ug | Freq: Once | INTRAMUSCULAR | Status: AC

## 2020-02-22 MED ORDER — FUROSEMIDE 10 MG/ML IJ SOLN
20.0000 mg | Freq: Once | INTRAMUSCULAR | Status: AC
  Administered 2020-02-22: 14:00:00 20 mg via INTRAVENOUS
  Filled 2020-02-22: qty 2

## 2020-02-22 MED ORDER — MIDAZOLAM HCL 2 MG/2ML IJ SOLN
1.0000 mg | INTRAMUSCULAR | Status: DC | PRN
  Administered 2020-02-23: 2 mg via INTRAVENOUS
  Administered 2020-02-27 (×2): 1 mg via INTRAVENOUS
  Filled 2020-02-22 (×2): qty 2

## 2020-02-22 MED ORDER — ETOMIDATE 2 MG/ML IV SOLN
20.0000 mg | Freq: Once | INTRAVENOUS | Status: AC
  Administered 2020-02-22: 20 mg via INTRAVENOUS

## 2020-02-22 MED ORDER — ORAL CARE MOUTH RINSE
15.0000 mL | OROMUCOSAL | Status: DC
  Administered 2020-02-22 – 2020-03-09 (×154): 15 mL via OROMUCOSAL

## 2020-02-22 MED ORDER — SODIUM CHLORIDE 0.9 % IV SOLN
2.0000 g | Freq: Three times a day (TID) | INTRAVENOUS | Status: DC
  Administered 2020-02-22 – 2020-02-23 (×3): 2 g via INTRAVENOUS
  Filled 2020-02-22 (×3): qty 2

## 2020-02-22 NOTE — Progress Notes (Signed)
Assisted Dr. Marchelle Gearing with Intubation and Bronchoscopy at bedside.  Patient tolerated well.

## 2020-02-22 NOTE — Consult Note (Addendum)
NAME:  Bruce Webb, MRN:  [id number], DOB:  12-31-1940, LOS: 7 ADMISSION DATE:  02/15/2020, CONSULTATION DATE:  02/19/2020 REFERRING MD:  Dr. Karleen Hampshire, CHIEF COMPLAINT:  Hypotension/ hypoxia    Brief History   79 year old male post COVID and PE in January on Eliquis admitted 2/21 to [facility] for extertional dyspnea found to be anemic with hemoccult positive stools.  GI consulted and transfused PRBC with since stabilized Hgb but has had persistent higher O2 requirements.  Was diuresed but today developed hypotension, with ongoing higher O2 requirements, PCCM was asked to consult.     History of present illness   HPI obtained via interpreter and from daughter.   79 year old male, who speaks [place], with history of HTN, HFpEF, DMT2, glaucoma with recent COVID 01/16/2020 with subsequent subacute PE on Eliquis and home O2 2L [place] who presented 2/21 with exertional dyspnea, black tarry stools, and worsening vision loss in his right eye.  Patient lives at home with his daughter and pre-COVID was independent in his ADLs.  He is a retired and prior worked in an office.  Denies any smoking or pulmonary history.  Reports prior to COVID, had a chronic non-productive cough for 10-15 years and his primary care MD treated it occasionally with cough medicine.   Patient with recent hospitalization at [facility] for [place] after initial positive 1/22 for failed outpatient treatment for COVID infection despite monoclonal antibodies and steroids.  While at [facility], treated with remdesivir and decadron and found to have a PE and was discharged home on 2/6 on 2L O2 and Eliquis.   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    On admit, found to have worsening anemia, 8.8-> 6 with positive fecal occult blood testing and hypotension responsive to fluid.  Additionally found to have WBC 28k, CXR with worsening multifocal airspace consolidation bilaterally.  He was started  empirically on zithromax and cefepime. PCT 0.5.  He was transfused with 2 units PRBC, started on protonix, and GI consulted. However, given tenuous respiratory status and increased O2 needs, GI deferred EGD.  Hgb trend since stable and therefore placed on heparin gtt on 2/23.  He was diuresed on 2/23 given fluid overload post blood transfusion with good output then but has since required midodrine to help with soft blood pressures.  On 2/25, he had worsening hypotension despite midodrine, ongoing elevated O2 needs, and transaminitis, PCCM asked to consult.     Past Medical History  HTN, HFpEF, DMT2, glaucoma with progressive vision loss (left, now rapid loss in right) - recent COVID 01/16/2020 with subsequent PE on Eliquis and O2 2L [place]  Significant Hospital Events   2/21 admit. Stool ob =negatoive  2/23- echo ef 65%  2/25 PCCM consulted for hypotension and persistent hypoxemia  2/26 - Has severe desaturations with activity and eating. Required 10L while eating his meal today. No acute distress but continues to have shortness of breath. Denies chest pain.   2/27 -  -remains on 10 L.  He easily desaturates.  [name] at the bedside.  I spoke to her and subsequently also to their family friend who is in physician assistant in cardiology [place]).  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  According to the daughter he got worse with COVID-19 then got better went home but he declined again in terms of his hypoxemia.  I personally visualized the current  CT scan of the chest compared to 4 weeks ago and to my visualization it looks like an extension of his pulmonary infiltrates from the original COVID-19 admission including a right right upper lobe anterior segment cavity thick-walled.   Consults:  GI  PCCM  Procedures:   Significant Diagnostic Tests:  2/21 CTH >> 1. No acute intracranial abnormalities. 2. Mild cerebral atrophy with chronic  microvascular ischemic changes in the cerebral white matter, as above.  CTA 01/27/20 - Diffuse subpleural/peripheral interstitial ground glass opacities with increased involvement in the the lower lobes bilaterally R>L. CXR 02/16/20 R>L multifocal pneumonia R>L, no edema or effusion  2/23 TTE >> 1. Left ventricular ejection fraction, by estimation, is 60 to 65%. The left ventricle has normal function. The left ventricle has no regional wall motion abnormalities. Left ventricular diastolic parameters are consistent with Grade II diastolic dysfunction (pseudonormalization).  2. The history indicates the patient has a pulmonary embolus. The RV is  not dilated and there is normal RV function. . Right ventricular systolic function is low normal. The right ventricular size is normal.  3. The mitral valve is normal in structure and function. Mild mitral valve regurgitation. No evidence of mitral stenosis.  4. The aortic valve is normal in structure and function. Aortic valve regurgitation is not visualized. No aortic stenosis is present.   Micro Data:  2/21 BCx 2 >> ngtd x 4 days 2/25 - CCP 2/27 - quant gold -  2/27 RVP - negative 2/28  - urine strep - negative 228 - urine leg -  2/28 - aspergillus Ab -  xxxxxxxxxxxx   Antimicrobials:  2/21 azithro>>2/26 2/21 ceftriaxone  2/22 cefepime >>      Interim history/subjective:   2/28 -transferredto  the intensive care unit with worsening respiratory distress requiring facemask oxygen and tachypnea.  According to the hospitalist family has insisted full code.  The nephew is a Librarian, academic with cardiology and is planning to have a family meeting to discuss goals of care  Objective   Blood pressure 140/81, pulse (!) 102, temperature 99.1 F (37.3 C), temperature source Axillary, resp. rate (!) 36, height 5\' 2"  (1.575 m), weight 52.3 kg, SpO2 (!) 75 %.        Intake/Output Summary (Last 24 hours) at 02/22/2020 1232 Last data filed at  02/22/2020 1100 Gross per 24 hour  Intake 125 ml  Output 1100 ml  Net -975 ml   Filed Weights   02/19/20 0659 02/20/20 0411 02/21/20 0421  Weight: 49.6 kg 50.3 kg 52.3 kg     General Appearance:  Looks c clean and emaciated on facemask t Head:  Normocephalic, without obvious abnormality, atraumatic Eyes:  PERRL -yes, conjunctiva/corneas -equal and clear     Ears:  Normal external ear canals, both ears Nose:  G tube -facemask oxygen present Throat:  ETT TUBE -no, OG tube -n0 poor dentition present with black teeth Neck:  Supple,  No enlargement/tenderness/nodules Lungs: Cachectic chest with wasting in the pectoral muscles.  Tachypneic and mildly paradoxical.  Facemask oxygen on.  Able to talk some sentences.  Able to understand some English Heart:  S1 and S2 normal, no murmur, CVP -no.  Pressors -no Abdomen:  Soft, no masses, no organomegaly Genitalia / Rectal:  Not done Extremities:  Extremities-no cyanosis no clubbing no edema Skin:  ntact in exposed areas . Sacral area -not examined Neurologic:  Sedation -none-> RASS -+1. Moves all 4s -yes. CAM-ICU -negative. Orientation -x3+  Resolved Hospital Problem list    Assessment & Plan:  79 year old male never smoker with prior COVID-19 infection diagnosed in January and requiring hospitalization from 2/1-2/6, recently diagnosed pulmonary emboli on anticoagulation and end-stage glaucoma with right vision loss in addition to his known left vision loss who presents with worsening shortness of breath and admitted for acute on chronic respiratory failure secondary to multifocal pneumonia.   Significant deconditioning plus    Acute hypoxemic respiratory failure secondary to post-inflammatory COVID-19 infection vs pulmonary fibrosis, atelectasis, pulmonary emboli.  CT chest 10/18/2020: Worsening infiltrates and groundglass opacity associated with right upper lobe anterior segment cavitary thick-walled.   02/22/2020 -worsening  acute hypoxemic respiratory failure.  Differential diagnosis here includes necrotizing pneumonia from poor dentition, bimodal phase of presentation post Covid recovery.  Low probably for tuberculosis even though he is from [place] area [place] during.  Immigration t process 10 years ago PPD skin test was negative]  -CCP antibody negative, RVP panel negative, urine strep negative.  Sedimentation rate 98 but procalcitonin 0.9   Plan --Await urine Legionella -Await ANA, double-stranded DNA, rheumatoid factor, and vasculitis serology such as MPO/PR-3 and GBM antibody -Await QuantiFERON gold -Await Aspergillus Ab  -Extremely high risk for bronchoscopy with lavage without intubation   -Continue cefepime for now -Continue Solu-Medrol 40 mg twice daily for now -Continue pulmonary toilet  - lasix 20mg  IV x 1  -Requires intubation -according to the family full code but wife's distant nephew with a physician assistant and is medically literate Rowe Pavy -is going to a family meeting and get back to CCM team  Poor Dentition   - needs opd workup - regular oral care of ICU  Pulmonary embolism   - change eliquis to IV heparin due to inabilty to take po   Hx of chronic d-chf  Plan  - lasix  X 1 empiric (did get hypotensive this admit forom lasix)  Transamijnitis - wosre 2/28 - get RUQ [place] depending on coiurse  - monitor  Acute urinary retention 2/27  Plan  - in and out foley with bladder scan  Hx of hypertension   - hold home meds   Anemia ICU but with guiac positive stools at admit.  - 2/22 - GI consult DR Collene Mares - scope considered too high risk with hypoxemica  -2/28  no active bleeding   Plan - protonix bid  - - PRBC for hgb </= 6.9gm%    - exceptions are   -  if ACS susepcted/confirmed then transfuse for hgb </= 8.0gm%,  or    -  active bleeding with hemodynamic instability, then transfuse regardless of hemoglobin value   At at all times try to transfuse 1 unit  prbc as possible with exception of active hemorrhage    Best practice:  Diet: per RD recs Pain/Anxiety/Delirium protocol (if indicated): n/a VAP protocol (if indicated): n/a DVT prophylaxis: heparin gtt (stop eliquis) GI prophylaxis: PPI bid dosing Glucose control: SSI Mobility: push  Code Status: Full  Family Communication:d.w Rowe Pavy the PA in cards who his husband ofpatient's distance niece and is family spokesperson due to medical knowledge  Disposition: ICU    ATTESTATION & SIGNATURE   The patient Bruce Webb is critically ill with multiple organ systems failure and requires high complexity decision making for assessment and support, frequent evaluation and titration of therapies, application of advanced monitoring technologies and extensive interpretation of multiple databases.   Critical Care Time devoted to patient care services described in this  note is  60  Minutes. This time reflects time of care of this signee Dr Kalman Shan. This critical care time does not reflect procedure time, or teaching time or supervisory time of PA/NP/Med student/Med Resident etc but could involve care discussion time     Dr. Kalman Shan, M.D., [facility].C.P Pulmonary and Critical Care Medicine Staff Physician [place] [facility] [place] [facility] Pager: [phone], If no answer or between  15:00h - 7:00h: call [phone]  02/22/2020 12:58 PM    SIGNATURE    Dr. Kalman Shan, M.D., F.C.C.P,  Pulmonary and Critical Care Medicine Staff Physician, [facility] Center Director - Interstitial Lung Disease  Program  Pulmonary Fibrosis [facility] at [place], [place], [address]  Pager: [phone], If no answer or between  15:00h - 7:00h: call [phone] Telephone: [phone]  12:32 PM 02/22/2020     LABS    PULMONARY Recent Labs  Lab 02/22/20 1045  PHART 7.485*  PCO2ART 33.4  PO2ART  47.4*  HCO3 25.0  O2SAT 83.7    CBC Recent Labs  Lab 02/20/20 0509 02/21/20 0517 02/22/20 0352  HGB 8.2* 9.1* 9.7*  HCT 26.1* 27.9* 30.7*  WBC 25.4* 28.1* 37.8*  PLT 293 360 244    COAGULATION Recent Labs  Lab 02/15/20 1845 02/19/20 1635  INR 1.7* 1.2    CARDIAC  No results for input(s): TROPONINI in the last 168 hours. No results for input(s): PROBNP in the last 168 hours.   CHEMISTRY Recent Labs  Lab 02/17/20 1646 02/17/20 1646 02/18/20 0505 02/18/20 0505 02/19/20 0315 02/19/20 0315 02/20/20 1045 02/22/20 0352  NA 131*  --  131*  --  135  --  137 134*  K 3.3*   < > 3.0*   < > 4.1   < > 4.6 4.8  CL 97*  --  98  --  99  --  103 102  CO2 23  --  25  --  27  --  26 26  GLUCOSE 256*  --  154*  --  117*  --  180* 161*  BUN 13  --  26*  --  28*  --  30* 44*  CREATININE 1.04  --  1.00  --  1.07  --  1.10 0.79  CALCIUM 7.6*  --  7.3*  --  7.7*  --  8.1* 8.5*  MG  --   --  1.7  --   --   --  2.0  --    < > = values in this interval not displayed.   Estimated Creatinine Clearance: 56.3 mL/min (by C-G formula based on SCr of 0.79 mg/dL).   LIVER Recent Labs  Lab 02/15/20 1845 02/16/20 0541 02/17/20 0715 02/19/20 0315 02/19/20 1635 02/21/20 1205 02/22/20 0352  AST  --  119* 186* 189*  --  77* 98*  ALT  --  120* 158* 169*  --  116* 124*  ALKPHOS  --  130* 161* 147*  --  167* 171*  BILITOT  --  1.3* 1.1 0.8  --  0.3 0.5  PROT  --  5.0* 4.6* 5.1*  --  6.4* 5.6*  ALBUMIN  --  1.5* 1.3* 1.3*  --  1.3* 1.5*  INR 1.7*  --   --   --  1.2  --   --      INFECTIOUS Recent Labs  Lab  02/15/20 1845 02/16/20 0541 02/17/20 0715 02/19/20 1202 02/19/20 1635  LATICACIDVEN 1.8  --   --  2.4* 1.9  PROCALCITON  --  0.66 0.50  --  0.99     ENDOCRINE CBG (last 3)  Recent Labs    02/22/20 0406 02/22/20 0835 02/22/20 1202  GLUCAP 150* 161* 202*         IMAGING x48h  - image(s) personally visualized  -   highlighted in bold CT Chest High  Resolution  Result Date: 02/20/2020 CLINICAL DATA:  Hypoxemia EXAM: CT CHEST WITHOUT CONTRAST TECHNIQUE: Multidetector CT imaging of the chest was performed following the standard protocol without intravenous contrast. High resolution imaging of the lungs, as well as inspiratory and expiratory imaging, was performed. COMPARISON:  Chest radiographs, 02/16/2020, CT chest angiogram, 01/27/2020 FINDINGS: Cardiovascular: No significant vascular findings. Normal heart size. No pericardial effusion. Mediastinum/Nodes: No enlarged mediastinal, hilar, or axillary lymph nodes. Thyroid gland, trachea, and esophagus demonstrate no significant findings. Lungs/Pleura: New small bilateral pleural effusions. Significant interval increase in extensive bilateral heterogeneous and ground-glass airspace opacity, particularly in the lower lungs. There is a new area of very dense consolidation of the right upper lobe and a new cavitary lesion of the anterior right upper lobe measuring 3.2 x 2.8 cm (series 5, image 57). Mild underlying centrilobular emphysema. Upper Abdomen: No acute abnormality. Musculoskeletal: No chest wall mass or suspicious bone lesions identified. IMPRESSION: 1. Significant interval increase in extensive bilateral heterogeneous and ground-glass airspace opacity, particularly in the lower lungs, consistent with worsened multifocal infection and/or ARDS. 2. There is a new area of very dense consolidation of the right upper lobe and a new cavitary lesion of the anterior right upper lobe measuring 3.2 x 2.8 cm. Findings are concerning for superimposed secondary infection. 3.  New small bilateral pleural effusions. 4.  Emphysema (ICD10-J43.9). Electronically Signed   By: Lauralyn Primes M.D.   On: 02/20/2020 18:57

## 2020-02-22 NOTE — Progress Notes (Signed)
RN indicated patient hypotensive  Plan statt neo via piv    SIGNATURE    Dr. Kalman Shan, M.D., F.C.C.P,  Pulmonary and Critical Care Medicine Staff Physician, [facility] Center Director - Interstitial Lung Disease  Program  Pulmonary Fibrosis [facility] at [facility] [place], [place], [address]  Pager: [phone], If no answer or between  15:00h - 7:00h: call [phone] Telephone: [phone]  7:00 PM 02/22/2020

## 2020-02-22 NOTE — Procedures (Addendum)
Bronchoscopy Procedure Note Tykeem Lanzer [id number] 07/05/1941  Procedure: Bronchoscopy Indications: Obtain specimens for culture and/or other diagnostic studies  Procedure Details Consent: Risks of procedure as well as the alternatives and risks of each were explained to the (patient/caregiver).  Consent for procedure obtained. - verbal consent at bedside from daughter Kandis Nab  Time Out: Verified patient identification, verified procedure, site/side was marked, verified correct patient position, special equipment/implants available, medications/allergies/relevent history reviewed, required imaging and test results available.  Performed  In preparation for procedure, patient was given 100% FiO2, bronchoscope lubricated and sedated and paralyzed earler for intubation. Sedation: Benzodiazepines, Etomidate and fentanyl,  Airway entered and the following bronchi were examined: RUL.  - carina and RUL and RMB were normal and clean Procedures performed: Brushings performed Bronchoscope removed.    Patient BP improved post intubation and only then bronch done  Evaluation Hemodynamic Status: Persistent hypotension treated with pressors; O2 sats: stable throughout Patient's Current Condition: stable Specimens:  Sent serosanguinous fluid Complications: No apparent complications Patient did tolerate procedure well.  speciment sent for   -cell count  -afb smear and culture  - mtb naai  - fungal smear  - bacterial culture - cytology - PCP via DFA   SIGNATURE    Dr. Kalman Shan, M.D., F.C.C.P,  Pulmonary and Critical Care Medicine Staff Physician, [facility] Center Director - Interstitial Lung Disease  Program  Pulmonary Fibrosis [facility] at [facility] [place], [place], [address]  Pager: [phone], If no answer or between  15:00h - 7:00h: call [phone] Telephone: [phone]  4:55 PM 02/22/2020

## 2020-02-22 NOTE — Progress Notes (Signed)
Spoke with Maralyn Sago RN re PICC order.  States has adequate access at this time for meds ordered.  Plan PICC to be placed 02/23/20.

## 2020-02-22 NOTE — Progress Notes (Signed)
Per RN patient with desat on HFNC.  Upon my arrival pt on NRB O2 sat 95%.  Transfer orders for ICU.   RN transported pt to 2M11 after report.

## 2020-02-22 NOTE — Progress Notes (Signed)
Call from Dr Blake Divine ->   Worsenign resp distress and hypoxemia  rec - DNR/DNI - bounce back covids do not do well  - If full code -> move to ICU under PCCM service    SIGNATURE    Dr. Kalman Shan, M.D., F.C.C.P,  Pulmonary and Critical Care Medicine Staff Physician, [facility] Center Director - Interstitial Lung Disease  Program  Pulmonary Fibrosis [facility] at [facility] [place], [place], [address]  Pager: [phone], If no answer or between  15:00h - 7:00h: call [phone] Telephone: [phone]  10:15 AM 02/22/2020

## 2020-02-22 NOTE — Progress Notes (Addendum)
POST INTUBATION  Patient developed tranissent hypotension needing fluids and neoo and 1 amp bic gtt  Plan  - map goal > 65 - proceeded with bronch once BP normalizegg - start vent mgmt prvc protocol  ARDS with deep sedation - insert PICC line  - place og - PAD protocol - RASS -3   Overall CCM time since first note but excluding procedure time but including family communicatiion and ccm managemt - > additional 60 minutes     SIGNATURE    Dr. Kalman Shan, M.D., F.C.C.P,  Pulmonary and Critical Care Medicine Staff Physician, [facility] Center Director - Interstitial Lung Disease  Program  Pulmonary Fibrosis [facility] Network at [facility] [place], [place], [address]  Pager: [phone], If no answer or between  15:00h - 7:00h: call [phone] Telephone: [phone]  4:49 PM 02/22/2020

## 2020-02-22 NOTE — Procedures (Signed)
Intubation Procedure Note Randol Zumstein [id number] 01-26-1941  Procedure: Intubation Indications: Respiratory insufficiency  Procedure Details Consent: Risks of procedure as well as the alternatives and risks of each were explained to the (patient/caregiver).  Consent for procedure obtained.  - verbal informed consent at bedside with family discussion due to progressive resp failure Time Out: Verified patient identification, verified procedure, site/side was marked, verified correct patient position, special equipment/implants available, medications/allergies/relevent history reviewed, required imaging and test results available.  Performed  Maximum sterile technique was used including cap, gloves, gown, hand hygiene, mask and CAPR WORN.  MAC  Mac 3 Grade 1 view No aspiration Easy intubation  Evaluation Hemodynamic Status: Persistent hypotension treated with pressors; O2 sats: transiently fell during during procedure Patient's Current Condition: stable Complications: No apparent complications Patient did tolerate procedure well. Chest X-ray ordered to verify placement.  CXR: pending.    SIGNATURE    Dr. Brand Males, M.D., F.C.C.P,  Pulmonary and Critical Care Medicine Staff Physician, [place] Director - Interstitial Lung Disease  Program  Pulmonary [place] at [place], [place], [address]  Pager: [phone], If no answer or between  15:00h - 7:00h: call [phone] Telephone: [phone]  4:48 PM 02/22/2020

## 2020-02-22 NOTE — Progress Notes (Signed)
eLink Physician-Brief Progress Note Patient Name: Bruce Webb DOB: 1941/12/23 MRN: [id number]   Date of Service  02/22/2020  HPI/Events of Note  Request for restraints. On camera assessment, patient with spontaneous eye opening and with vent desynchrony. Bedside RN titrating sedation as BP permits and will be starting phenylephrine. Currently on Fentanyl 100 and off propofol.  eICU Interventions  Bilateral soft wrist restraints ordered     Intervention Category Minor Interventions: Agitation / anxiety - evaluation and management  Darl Pikes 02/22/2020, 8:13 PM

## 2020-02-22 NOTE — Progress Notes (Addendum)
Patient's oxygen saturation dropped to 60-70s in nasal canula high flow at 15 L.  Patient appears in distress, unable to performed purse lip breathing. Sinus tachy.  MEWS from yellow to red. Labored breathing. Patient was place in non-rebreather mask. Oxygen saturation currently at 95%.  Rapid response called. MD aware of pt's oxygen desaturation.

## 2020-02-22 NOTE — Progress Notes (Addendum)
UPDATE   D./w Family contact: Rowe Pavy -earlier - family decision is full code and to intubate but after 1 key family member visits before intubation - this has happened now   calld to assess at bedside   priogressive fatigue with resp distress - some worse than earlier eval   AP  Intubate Then Bronch RUL with BAL  Also discussed with daughter Vivien Rossetti at bedside - she confirmed full code. Explained highly uncertain and poor prognosis   SIGNATURE    Dr. Brand Males, M.D., F.C.C.P,  Pulmonary and Critical Care Medicine Staff Physician, [place] Director - Interstitial Lung Disease  Program  Pulmonary [place] at [place], [place], [address]  Pager: [phone], If no answer or between  15:00h - 7:00h: call [phone] Telephone: [phone]  4:12 PM 02/22/2020

## 2020-02-22 NOTE — Progress Notes (Addendum)
[facility]ious [facility]   Reason for visit: Follow up on hypoxia  Interval History: now in ICU and on a NRB from worsening respiratory status.  WBC up to 37.8, remains afebrile.  CXR with bilateral infiltrates  Physical Exam: Constitutional:  Vitals:   02/22/20 1100 02/22/20 1150  BP: 140/81   Pulse:  (!) 102  Resp: (!) 29 (!) 36  Temp:  99.1 F (37.3 C)  SpO2: 95% (!) 75%    Eyes: anicteric HENT: + NRB Respiratory: increased respiratory effort   Review of Systems: Constitutional: negative for fevers and chills Gastrointestinal: negative for nausea and diarrhea  Lab Results  Component Value Date   WBC 37.8 (H) 02/22/2020   HGB 9.7 (L) 02/22/2020   HCT 30.7 (L) 02/22/2020   MCV 87.0 02/22/2020   PLT 244 02/22/2020    Lab Results  Component Value Date   CREATININE 0.79 02/22/2020   BUN 44 (H) 02/22/2020   NA 134 (L) 02/22/2020   K 4.8 02/22/2020   CL 102 02/22/2020   CO2 26 02/22/2020    Lab Results  Component Value Date   ALT 124 (H) 02/22/2020   AST 98 (H) 02/22/2020   ALKPHOS 171 (H) 02/22/2020     Microbiology: Recent Results (from the past 240 hour(s))  Culture, blood (routine x 2)     Status: None (Preliminary result)   Collection Time: 02/15/20  6:30 PM   Specimen: BLOOD  Result Value Ref Range Status   Specimen Description BLOOD SITE NOT SPECIFIED  Final   Special Requests   Final    BOTTLES DRAWN AEROBIC AND ANAEROBIC Blood Culture adequate volume   Culture NO GROWTH 4 DAYS  Final   Report Status PENDING  Incomplete  Culture, blood (routine x 2)     Status: None (Preliminary result)   Collection Time: 02/15/20  6:45 PM   Specimen: BLOOD  Result Value Ref Range Status   Specimen Description BLOOD SITE NOT SPECIFIED  Final   Special Requests   Final    BOTTLES DRAWN AEROBIC AND ANAEROBIC Blood Culture adequate volume   Culture NO GROWTH 4 DAYS  Final   Report Status PENDING  Incomplete  Respiratory Panel by PCR      Status: None   Collection Time: 02/21/20  9:48 PM   Specimen: Nasopharyngeal Swab; Respiratory  Result Value Ref Range Status   Adenovirus NOT DETECTED NOT DETECTED Final   Coronavirus 229E NOT DETECTED NOT DETECTED Final    Comment: (NOTE) The Coronavirus on the Respiratory Panel, DOES NOT test for the novel  Coronavirus (2019 nCoV)    Coronavirus HKU1 NOT DETECTED NOT DETECTED Final   Coronavirus NL63 NOT DETECTED NOT DETECTED Final   Coronavirus OC43 NOT DETECTED NOT DETECTED Final   Metapneumovirus NOT DETECTED NOT DETECTED Final   Rhinovirus / Enterovirus NOT DETECTED NOT DETECTED Final   Influenza A NOT DETECTED NOT DETECTED Final   Influenza B NOT DETECTED NOT DETECTED Final   Parainfluenza Virus 1 NOT DETECTED NOT DETECTED Final   Parainfluenza Virus 2 NOT DETECTED NOT DETECTED Final   Parainfluenza Virus 3 NOT DETECTED NOT DETECTED Final   Parainfluenza Virus 4 NOT DETECTED NOT DETECTED Final   Respiratory Syncytial Virus NOT DETECTED NOT DETECTED Final   Bordetella pertussis NOT DETECTED NOT DETECTED Final   Chlamydophila pneumoniae NOT DETECTED NOT DETECTED Final   Mycoplasma pneumoniae NOT DETECTED NOT DETECTED Final    Comment: Performed at [facility],  [address], [place], [place] [address]    Impression/Plan:  1. Cavitary pneumonia and multifocal opacities - new area of dense consolidation of RUL and new cavitary lesion of RUL, anterior area.  Unclear etiology and broad differential in his with previous COVID-19 infection with steroid use.  Pneumocystis possible, Mycobacterial disease, fungal.  Not able to do BAL due to respiratory status.   I will start empiric treatment with Bactrim for ? PJP (already on steroids) Continue with cefepime Other work up pending, sputum not yet sent  2.  Medication monitoring - creat wnl and will monitor on Bactrim  3.  transaminitis - with increase alk phos.  [place] without gallstones.  Trending down.    Dr. Baxter Flattery on  tomorrow

## 2020-02-22 NOTE — Procedures (Signed)
Arterial Catheter Insertion Procedure Note Cambren Helm [id number] 1941-09-09  Procedure: Insertion of Arterial Catheter  Indications: Blood pressure monitoring  Procedure Details Consent: Risks of procedure as well as the alternatives and risks of each were explained to the (patient/caregiver).  Consent for procedure obtained. Time Out: Verified patient identification, verified procedure, site/side was marked, verified correct patient position, special equipment/implants available, medications/allergies/relevent history reviewed, required imaging and test results available.  Performed  Maximum sterile technique was used including antiseptics, cap, gloves, gown, hand hygiene, mask and sheet. Skin prep: Chlorhexidine; local anesthetic administered 20 gauge catheter was inserted into right radial artery using the Seldinger technique. ULTRASOUND GUIDANCE USED: YES Evaluation Blood flow good; BP tracing good. Complications: No apparent complications.   Lorin Glass 02/22/2020

## 2020-02-22 NOTE — Progress Notes (Signed)
PROGRESS NOTE    Bruce Webb  ZJQ:[id number] DOB: 04/04/1941 DOA: 02/15/2020 PCP: Ignatius Specking, MD  Brief Narrative:  79 -year-old gentleman with  prior history of essential hypertension, type 2 diabetes mellitus, recent COVID-19 illness initially diagnosed on 01/16/2020, left eye vision loss, recent discharge from the hospital on 01/31/2020 on 2 L of nasal cannula oxygen, pulmonary embolism presents to ED with worsening shortness of breath and worsening vision loss in his right eye.  Of note patient was seen by  ophthalmology on 02/10/2020 and was diagnosed with end-stage glaucoma of both eyes despite maximal treatment with multiple eyedrops. On arrival to ED his chest x-ray was found to have bilateral infiltrates and elevated WBC count of 28,000's.  He was also found to have anemia with a hemoglobin of 6, underwent 2 units of PRBC transfusion.  He was started on IV Protonix for possible blood loss anemia and IV antibiotics for superimposed multifocal pneumonia.  Gastroenterology was consulted and initially was scheduled for endoscopy but in view of his tenuous respiratory status endoscopy was canceled.  Patient is currently on IV heparin and there have been no signs of bleeding or melanotic stools.  Discussed with Dr. Elnoria Howard since his hemoglobin has been seen stable, recommended to  transition to  Eliquis and watch for any signs of bleeding and Outpatient follow-up with gastroenterology for EGD. Hospital course was complicated by acute on chronic diastolic heart failure (for which he was appropriately diuresed), hypotension and lactic acidosis.  PCCM was consulted for persistent hypoxia.HRCT was done on 2/27 showing Significant interval increase in extensive bilateral heterogeneous and ground-glass airspace opacity, particularly in the lower lungs, consistent with worsened multifocal infection and/or ARDS. There is a new area of very dense consolidation of the right upper lobe and a new cavitary lesion of  the anterior right upper lobe measuring 3.2 x 2.8 cm. Findings are concerning for superimposed secondary infection.  Infectious disease consulted to rule out tuberculosis. On exam today, pts sats were in 60% on 15l it of HF oxygen. RR called, pt placed on NRB and PCCM called for transfer to ICU. He will need Intubation and closer monitoring.      Assessment & Plan:   Principal Problem:   Acute GI bleeding Active Problems:   Essential hypertension   Pulmonary embolism (HCC)   Pneumonia with cavity of lung   Hyponatremia   Hyperglycemia   Pressure injury of skin   Melena, acute blood loss anemia Suspicious for upper GI bleed.  GI was consulted and he was initially scheduled to have an upper endoscopy but was canceled as he had worsening oxygen requirement.   Patient underwent  2 units of PRBC transfusion and patient's hemoglobin has been stable around 8 since then.  On further discussion with Dr. Elnoria Howard, recommended to transition IV heparin to oral Eliquis for his pulmonary embolism and watch for signs of bleeding.  Transition IV PPI to oral PPI twice daily.    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 Patient was recently admitted for severe hypoxic respiratory failure from Covid illness and was discharged on 01/31/2020 on 2 L of oxygen. He was readmitted for secondary/superimposed bacterial pneumonia with leukocytosis and elevated procalcitonin. He was started on broad-spectrum IV antibiotics (IV cefepime and IV azithromycin).  IV azithromycin was discontinued after 3 days. PCCM consulted for persistent hypoxia, leukocytosis of greater than 20,000, lactic acidosis  and hypotension. He was started on steroids.  High-resolution CT of the chest showed on 2/27 showed increase in extensive bilateral heterogeneous groundglass opacities new area of very  dense consolidation of the right upper lobe and a new cavitary lesion of the anterior right upper lobe. ID consulted for evaluation of tuberculosis.  QuantiFERON gold ordered and pending. Further recommendations from PCCM to follow     Acute  diastolic heart failure His BNP was elevated at 234.8 and his echocardiogram showed Left ventricular ejection fraction, by estimation, is 60 to 65%. The left ventricle has no regional wall motion abnormalities. Left ventricular diastolic parameters are consistent with Grade II diastolic  dysfunction (pseudonormalization). He was started on IV Lasix , was appropriately diuresed.  Lasix was discontinued for hypotension. Currently  patient appears euvolemic    Pulmonary embolism Patient was initially started on IV heparin, transition to Eliquis. No signs of bleeding at this time.   Elevated liver enzymes Patient denies any nausea or vomiting or abdominal pain, has good appetite. Repeat liver panel probably secondary to congestion from acute diastolic heart failure vs ? Covid.  Ultrasound abdomen ordered for further evaluation..  Total bilirubin within normal limits,  INR wnl.  Improving liver enzymes   Mild hyponatremia Resolved   Hypokalemia Replaced.     Essential hypertension Well-controlled blood pressure parameters    Hypertension Probably from aggressive diuresis.  Blood pressure improved with fluid bolus.  Constipation Senna Colace and MiraLAX ordered, 2 bowel movements since yesterday.   Acute urinary retention Unclear etiology , in an dout done yesterday.  Frequent bladder scans and watch urine output.    DVT prophylaxis: Heparin Code Status: Full code Family Communication: Discussed with family over the phone about the transfer to ICU.   Disposition Plan:  Pt from home, PT eval recommending SNF VS home with home health and 24 hours supervision.  Pt not stable discharge , transfer to ICU today.     Consultants:     PCCM   ID  Gastroenterology Dr Elnoria Howard   Procedures: HRCT  Antimicrobials: Anti-infectives (From admission, onward)   Start     Dose/Rate Route Frequency Ordered Stop   02/19/20 2230  ceFEPIme (MAXIPIME) 2 g in sodium chloride 0.9 % 100 mL IVPB     2 g 200 mL/hr over 30 Minutes Intravenous Every 24 hours 02/19/20 1750 02/20/20 2313   02/19/20 2221  ceFEPIme (MAXIPIME) 2 g in sodium chloride 0.9 % 100 mL IVPB  Status:  Discontinued     2 g 200 mL/hr over 30 Minutes Intravenous Every 24 hours 02/19/20 0916 02/19/20 1750   02/17/20 0000  azithromycin (ZITHROMAX) 500 mg in sodium chloride 0.9 % 250 mL IVPB     500 mg 250 mL/hr over 60 Minutes Intravenous Every 24 hours 02/16/20 0024 02/20/20 0150   02/16/20 1800  cefTRIAXone (ROCEPHIN) 2 g in sodium chloride 0.9 % 100 mL IVPB  Status:  Discontinued     2 g 200 mL/hr over 30 Minutes Intravenous Every 24 hours 02/16/20 0024 02/16/20 1312   02/16/20 1330  ceFEPIme (MAXIPIME) 2 g in sodium chloride 0.9 % 100 mL IVPB  Status:  Discontinued     2 g 200 mL/hr over 30 Minutes Intravenous Every 12 hours 02/16/20 1325 02/19/20 0916   02/15/20 1745  cefTRIAXone (ROCEPHIN) 1 g in sodium chloride 0.9 % 100 mL IVPB     1 g 200 mL/hr over 30 Minutes Intravenous  Once 02/15/20 1742 02/15/20 2126  02/15/20 1745  azithromycin (ZITHROMAX) 500 mg in sodium chloride 0.9 % 250 mL IVPB     500 mg 250 mL/hr over 60 Minutes Intravenous  Once 02/15/20 1742 02/16/20 0134      Subjective: TACHYPNEA, , ON 15 LIT OF HF oxygen.   Objective: Vitals:   02/22/20 0600 02/22/20 0736 02/22/20 1100 02/22/20 1150  BP:   140/81   Pulse:    (!) 102  Resp: (!) 25  (!) 29 (!) 36  Temp:    99.1 F (37.3 C)  TempSrc:    Axillary  SpO2: 92% 94% 95% (!) 75%  Weight:      Height:        Intake/Output Summary (Last 24 hours) at 02/22/2020 1202 Last data filed at 02/22/2020 1100 Gross per 24 hour  Intake 125 ml  Output 1100 ml  Net -975 ml   Filed Weights    02/19/20 0659 02/20/20 0411 02/21/20 0421  Weight: 49.6 kg 50.3 kg 52.3 kg    Examination:  General exam: Frail ill-appearing gentleman in mod distress.  Respiratory system: Tachypnea present, decreased air entry throughout the lungs,  Cardiovascular system: S1-S2 heard, regular rate rhythm, no JVD, appears euvolemic, no pedal edema Gastrointestinal system: Abdomen is soft, nontender, nondistended, bowel sounds normal Central nervous system: Alert and oriented and answering some questions.  extremities: No pedal edema Skin: Nasal bridge skin tear from nasal prongs use Psychiatry: cannot be assessed.     Data Reviewed: I have personally reviewed following labs and imaging studies  CBC: Recent Labs  Lab 02/15/20 1640 02/16/20 1019 02/18/20 0505 02/19/20 0315 02/20/20 0509 02/21/20 0517 02/22/20 0352  WBC 28.1*   < > 29.4* 26.2* 25.4* 28.1* 37.8*  NEUTROABS 23.2*  --   --   --   --   --   --   HGB 6.0*   < > 8.3* 8.2* 8.2* 9.1* 9.7*  HCT 19.0*   < > 24.4* 25.0* 26.1* 27.9* 30.7*  MCV 87.2   < > 82.7 85.9 87.0 85.8 87.0  PLT 266   < > 227 249 293 360 244   < > = values in this interval not displayed.   Basic Metabolic Panel: Recent Labs  Lab 02/17/20 1646 02/18/20 0505 02/19/20 0315 02/20/20 1045 02/22/20 0352  NA 131* 131* 135 137 134*  K 3.3* 3.0* 4.1 4.6 4.8  CL 97* 98 99 103 102  CO2 23 25 27 26 26   GLUCOSE 256* 154* 117* 180* 161*  BUN 13 26* 28* 30* 44*  CREATININE 1.04 1.00 1.07 1.10 0.79  CALCIUM 7.6* 7.3* 7.7* 8.1* 8.5*  MG  --  1.7  --  2.0  --    GFR: Estimated Creatinine Clearance: 56.3 mL/min (by C-G formula based on SCr of 0.79 mg/dL). Liver Function Tests: Recent Labs  Lab 02/16/20 0541 02/17/20 0715 02/19/20 0315 02/21/20 1205 02/22/20 0352  AST 119* 186* 189* 77* 98*  ALT 120* 158* 169* 116* 124*  ALKPHOS 130* 161* 147* 167* 171*  BILITOT 1.3* 1.1 0.8 0.3 0.5  PROT 5.0* 4.6* 5.1* 6.4* 5.6*  ALBUMIN 1.5* 1.3* 1.3* 1.3* 1.5*   No  results for input(s): LIPASE, AMYLASE in the last 168 hours. No results for input(s): AMMONIA in the last 168 hours. Coagulation Profile: Recent Labs  Lab 02/15/20 1845 02/19/20 1635  INR 1.7* 1.2   Cardiac Enzymes: No results for input(s): CKTOTAL, CKMB, CKMBINDEX, TROPONINI in the last 168 hours. BNP (last 3 results) No results  for input(s): PROBNP in the last 8760 hours. HbA1C: No results for input(s): HGBA1C in the last 72 hours. CBG: Recent Labs  Lab 02/21/20 1654 02/21/20 2011 02/22/20 0035 02/22/20 0406 02/22/20 0835  GLUCAP 212* 297* 179* 150* 161*   Lipid Profile: No results for input(s): CHOL, HDL, LDLCALC, TRIG, CHOLHDL, LDLDIRECT in the last 72 hours. Thyroid Function Tests: No results for input(s): TSH, T4TOTAL, FREET4, T3FREE, THYROIDAB in the last 72 hours. Anemia Panel: Recent Labs    02/20/20 1045  FERRITIN 403*   Sepsis Labs: Recent Labs  Lab 02/15/20 1845 02/16/20 0541 02/17/20 0715 02/19/20 1202 02/19/20 1635  PROCALCITON  --  0.66 0.50  --  0.99  LATICACIDVEN 1.8  --   --  2.4* 1.9    Recent Results (from the past 240 hour(s))  Culture, blood (routine x 2)     Status: None (Preliminary result)   Collection Time: 02/15/20  6:30 PM   Specimen: BLOOD  Result Value Ref Range Status   Specimen Description BLOOD SITE NOT SPECIFIED  Final   Special Requests   Final    BOTTLES DRAWN AEROBIC AND ANAEROBIC Blood Culture adequate volume   Culture NO GROWTH 4 DAYS  Final   Report Status PENDING  Incomplete  Culture, blood (routine x 2)     Status: None (Preliminary result)   Collection Time: 02/15/20  6:45 PM   Specimen: BLOOD  Result Value Ref Range Status   Specimen Description BLOOD SITE NOT SPECIFIED  Final   Special Requests   Final    BOTTLES DRAWN AEROBIC AND ANAEROBIC Blood Culture adequate volume   Culture NO GROWTH 4 DAYS  Final   Report Status PENDING  Incomplete  Respiratory Panel by PCR     Status: None   Collection Time:  02/21/20  9:48 PM   Specimen: Nasopharyngeal Swab; Respiratory  Result Value Ref Range Status   Adenovirus NOT DETECTED NOT DETECTED Final   Coronavirus 229E NOT DETECTED NOT DETECTED Final    Comment: (NOTE) The Coronavirus on the Respiratory Panel, DOES NOT test for the novel  Coronavirus (2019 nCoV)    Coronavirus HKU1 NOT DETECTED NOT DETECTED Final   Coronavirus NL63 NOT DETECTED NOT DETECTED Final   Coronavirus OC43 NOT DETECTED NOT DETECTED Final   Metapneumovirus NOT DETECTED NOT DETECTED Final   Rhinovirus / Enterovirus NOT DETECTED NOT DETECTED Final   Influenza A NOT DETECTED NOT DETECTED Final   Influenza B NOT DETECTED NOT DETECTED Final   Parainfluenza Virus 1 NOT DETECTED NOT DETECTED Final   Parainfluenza Virus 2 NOT DETECTED NOT DETECTED Final   Parainfluenza Virus 3 NOT DETECTED NOT DETECTED Final   Parainfluenza Virus 4 NOT DETECTED NOT DETECTED Final   Respiratory Syncytial Virus NOT DETECTED NOT DETECTED Final   Bordetella pertussis NOT DETECTED NOT DETECTED Final   Chlamydophila pneumoniae NOT DETECTED NOT DETECTED Final   Mycoplasma pneumoniae NOT DETECTED NOT DETECTED Final    Comment: Performed at [facility], [address], [place], [place] [address]         Radiology Studies: CT Chest High Resolution  Result Date: 02/20/2020 CLINICAL DATA:  Hypoxemia EXAM: CT CHEST WITHOUT CONTRAST TECHNIQUE: Multidetector CT imaging of the chest was performed following the standard protocol without intravenous contrast. High resolution imaging of the lungs, as well as inspiratory and expiratory imaging, was performed. COMPARISON:  Chest radiographs, 02/16/2020, CT chest angiogram, 01/27/2020 FINDINGS: Cardiovascular: No significant vascular findings. Normal heart size. No pericardial effusion.  Mediastinum/Nodes: No enlarged mediastinal, hilar, or axillary lymph nodes. Thyroid gland, trachea, and esophagus demonstrate no significant findings. Lungs/Pleura: New  small bilateral pleural effusions. Significant interval increase in extensive bilateral heterogeneous and ground-glass airspace opacity, particularly in the lower lungs. There is a new area of very dense consolidation of the right upper lobe and a new cavitary lesion of the anterior right upper lobe measuring 3.2 x 2.8 cm (series 5, image 57). Mild underlying centrilobular emphysema. Upper Abdomen: No acute abnormality. Musculoskeletal: No chest wall mass or suspicious bone lesions identified. IMPRESSION: 1. Significant interval increase in extensive bilateral heterogeneous and ground-glass airspace opacity, particularly in the lower lungs, consistent with worsened multifocal infection and/or ARDS. 2. There is a new area of very dense consolidation of the right upper lobe and a new cavitary lesion of the anterior right upper lobe measuring 3.2 x 2.8 cm. Findings are concerning for superimposed secondary infection. 3.  New small bilateral pleural effusions. 4.  Emphysema (ICD10-J43.9). Electronically Signed   By: Lauralyn Primes M.D.   On: 02/20/2020 18:57        Scheduled Meds: . apixaban  5 mg Oral BID  . brimonidine  1 drop Both Eyes BID  . budesonide (PULMICORT) nebulizer solution  0.5 mg Nebulization BID  . dorzolamide  1 drop Both Eyes BID  . feeding supplement (ENSURE ENLIVE)  237 mL Oral TID BM  . feeding supplement (PRO-STAT SUGAR FREE 64)  30 mL Oral BID  . guaiFENesin  600 mg Oral BID  . insulin aspart  0-9 Units Subcutaneous Q4H  . latanoprost  1 drop Both Eyes QHS  . methylPREDNISolone (SOLU-MEDROL) injection  40 mg Intravenous Q12H  . midodrine  10 mg Oral BID WC  . multivitamin with minerals  1 tablet Oral Daily  . pantoprazole (PROTONIX) IV  40 mg Intravenous Q12H  . polyethylene glycol  17 g Oral Daily  . senna-docusate  2 tablet Oral BID  . sodium chloride flush  3 mL Intravenous Q12H  . sodium chloride flush  3 mL Intravenous Q12H  . timolol  1 drop Both Eyes BID  . white  petrolatum   Topical BID   Continuous Infusions: . sodium chloride       LOS: 7 days        Kathlen Mody, MD [facility]   To contact the attending provider between 7A-7P or the covering provider during after hours 7P-7A, please log into the web site [email or web address] and access using universal [place] password for that web site. If you do not have the password, please call the hospital operator.  02/22/2020, 12:02 PM

## 2020-02-22 NOTE — Progress Notes (Signed)
Patient intubated for respiratory distress. Ramaswamy MD notified for desaturations down to the 70s on non-rebreather with labored breathing.

## 2020-02-22 NOTE — Progress Notes (Signed)
ANTICOAGULATION CONSULT NOTE   Pharmacy Consult for heparin Indication: 01/27/20 small subacute PE  No Known Allergies  Patient Measurements: Height: 5\' 2"  (157.5 cm) Weight: 115 lb 4.8 oz (52.3 kg) IBW/kg (Calculated) : 54.6 Heparin Dosing Weight: 52kg  Vital Signs: Temp: 99.1 F (37.3 C) (02/28 1150) Temp Source: Axillary (02/28 1150) BP: 140/81 (02/28 1100) Pulse Rate: 102 (02/28 1150)  Labs: Recent Labs    02/19/20 1635 02/20/20 0509 02/20/20 0509 02/20/20 1045 02/20/20 1327 02/21/20 0517 02/22/20 0352  HGB  --  8.2*   < >  --   --  9.1* 9.7*  HCT  --  26.1*  --   --   --  27.9* 30.7*  PLT  --  293  --   --   --  360 244  APTT  --  55*  --   --   --   --   --   LABPROT 15.1  --   --   --   --   --   --   INR 1.2  --   --   --   --   --   --   HEPARINUNFRC  --  0.24*  --   --  0.34  --   --   CREATININE  --   --   --  1.10  --   --  0.79   < > = values in this interval not displayed.    Estimated Creatinine Clearance: 56.3 mL/min (by C-G formula based on SCr of 0.79 mg/dL).   Assessment: 79 y.o. male with 01/27/20 small subacute PE, PTA Eliquis on hold, last dose 2/21. Started on heparin with low goal for H/H drop requiring transfusion on admit, stable H/H and platelets x5 days.  Initially switched back to apixaban 2/26, now NPO, pending possible intubation  Last dose apixaban last evening ~ 2200  CBC stbale  Goal of Therapy:  Monitor platelets by anticoagulation protocol: Yes   Plan:  DC apixaban Resume Heparin 1100 units/hr Initial aptt 2200 Daily hep lvl aptt cbc F/u plans to return to apixaban  3/26, PharmD, BCPS, BCCCP Clinical Pharmacist [phone]  Please check AMION for all [facility] numbers  02/22/2020 1:24 PM

## 2020-02-22 NOTE — Progress Notes (Signed)
Pharmacy Antibiotic Note  Bruce Webb is a 79 y.o. male admitted on 02/15/2020 with cavitary pneumonia. Patient had recent covid infection with steroid use  ID following with concern for PCP, Mycobacterial disease, or fungal infection  Patient likely needs bronch but needs family decision on intubation first  Renal fx stable/good  Plan: Cefepime 2 g q8h Bactrim 15 mg/kg/day div q8h F/u cx, bal, ID recs Watch renal fx, K while on bactrim  Height: _0  (157.5 cm) Weight: 115 lb 4.8 oz (52.3 kg) IBW/kg (Calculated) : 54.6  Temp (24hrs), Avg:98.3 F (36.8 C), Min:97.5 F (36.4 C), Max:99.1 F (37.3 C)  Recent Labs  Lab 02/15/20 1845 02/16/20 0541 02/17/20 0715 02/17/20 1646 02/18/20 0505 02/19/20 0315 02/19/20 1202 02/19/20 1635 02/20/20 0509 02/20/20 1045 02/21/20 0517 02/22/20 0352  WBC  --    < >   < >  --  29.4* 26.2*  --   --  25.4*  --  28.1* 37.8*  CREATININE  --    < >  --  1.04 1.00 1.07  --   --   --  1.10  --  0.79  LATICACIDVEN 1.8  --   --   --   --   --  2.4* 1.9  --   --   --   --    < > = values in this interval not displayed.    Estimated Creatinine Clearance: 56.3 mL/min (by C-G formula based on SCr of 0.79 mg/dL).    No Known Allergies  Barth Kirks, PharmD, BCPS, BCCCP Clinical Pharmacist [phone]  Please check AMION for all [place] numbers  02/22/2020 1:32 PM

## 2020-02-23 ENCOUNTER — Encounter (HOSPITAL_COMMUNITY): Payer: Self-pay | Admitting: Family Medicine

## 2020-02-23 DIAGNOSIS — Z515 Encounter for palliative care: Secondary | ICD-10-CM

## 2020-02-23 DIAGNOSIS — Z7189 Other specified counseling: Secondary | ICD-10-CM

## 2020-02-23 DIAGNOSIS — J80 Acute respiratory distress syndrome: Secondary | ICD-10-CM

## 2020-02-23 LAB — LACTATE DEHYDROGENASE: LDH: 379 U/L — ABNORMAL HIGH (ref 98–192)

## 2020-02-23 LAB — POCT I-STAT 7, (LYTES, BLD GAS, ICA,H+H)
Acid-base deficit: 2 mmol/L (ref 0.0–2.0)
Acid-base deficit: 3 mmol/L — ABNORMAL HIGH (ref 0.0–2.0)
Acid-base deficit: 3 mmol/L — ABNORMAL HIGH (ref 0.0–2.0)
Bicarbonate: 24.3 mmol/L (ref 20.0–28.0)
Bicarbonate: 25.5 mmol/L (ref 20.0–28.0)
Bicarbonate: 26.3 mmol/L (ref 20.0–28.0)
Calcium, Ion: 1.15 mmol/L (ref 1.15–1.40)
Calcium, Ion: 1.17 mmol/L (ref 1.15–1.40)
Calcium, Ion: 1.17 mmol/L (ref 1.15–1.40)
HCT: 26 % — ABNORMAL LOW (ref 39.0–52.0)
HCT: 27 % — ABNORMAL LOW (ref 39.0–52.0)
HCT: 31 % — ABNORMAL LOW (ref 39.0–52.0)
Hemoglobin: 10.5 g/dL — ABNORMAL LOW (ref 13.0–17.0)
Hemoglobin: 8.8 g/dL — ABNORMAL LOW (ref 13.0–17.0)
Hemoglobin: 9.2 g/dL — ABNORMAL LOW (ref 13.0–17.0)
O2 Saturation: 82 %
O2 Saturation: 94 %
O2 Saturation: 99 %
Patient temperature: 97.5
Patient temperature: 98.6
Patient temperature: 98.6
Potassium: 4.8 mmol/L (ref 3.5–5.1)
Potassium: 4.9 mmol/L (ref 3.5–5.1)
Potassium: 5.2 mmol/L — ABNORMAL HIGH (ref 3.5–5.1)
Sodium: 133 mmol/L — ABNORMAL LOW (ref 135–145)
Sodium: 134 mmol/L — ABNORMAL LOW (ref 135–145)
Sodium: 134 mmol/L — ABNORMAL LOW (ref 135–145)
TCO2: 26 mmol/L (ref 22–32)
TCO2: 27 mmol/L (ref 22–32)
TCO2: 28 mmol/L (ref 22–32)
pCO2 arterial: 53.1 mmHg — ABNORMAL HIGH (ref 32.0–48.0)
pCO2 arterial: 54.8 mmHg — ABNORMAL HIGH (ref 32.0–48.0)
pCO2 arterial: 71.1 mmHg (ref 32.0–48.0)
pH, Arterial: 7.176 — CL (ref 7.350–7.450)
pH, Arterial: 7.268 — ABNORMAL LOW (ref 7.350–7.450)
pH, Arterial: 7.273 — ABNORMAL LOW (ref 7.350–7.450)
pO2, Arterial: 165 mmHg — ABNORMAL HIGH (ref 83.0–108.0)
pO2, Arterial: 54 mmHg — ABNORMAL LOW (ref 83.0–108.0)
pO2, Arterial: 94 mmHg (ref 83.0–108.0)

## 2020-02-23 LAB — HEPARIN LEVEL (UNFRACTIONATED)
Heparin Unfractionated: 0.64 IU/mL (ref 0.30–0.70)
Heparin Unfractionated: 0.63 IU/mL (ref 0.30–0.70)

## 2020-02-23 LAB — RHEUMATOID FACTOR: Rheumatoid fact SerPl-aCnc: 54 IU/mL — ABNORMAL HIGH (ref 0.0–13.9)

## 2020-02-23 LAB — CBC WITH DIFFERENTIAL/PLATELET
Abs Immature Granulocytes: 2.88 10*3/uL — ABNORMAL HIGH (ref 0.00–0.07)
Basophils Absolute: 0.1 10*3/uL (ref 0.0–0.1)
Basophils Relative: 0 %
Eosinophils Absolute: 0 10*3/uL (ref 0.0–0.5)
Eosinophils Relative: 0 %
HCT: 27 % — ABNORMAL LOW (ref 39.0–52.0)
Hemoglobin: 8.5 g/dL — ABNORMAL LOW (ref 13.0–17.0)
Immature Granulocytes: 6 %
Lymphocytes Relative: 6 %
Lymphs Abs: 2.6 10*3/uL (ref 0.7–4.0)
MCH: 27.6 pg (ref 26.0–34.0)
MCHC: 31.5 g/dL (ref 30.0–36.0)
MCV: 87.7 fL (ref 80.0–100.0)
Monocytes Absolute: 2.5 10*3/uL — ABNORMAL HIGH (ref 0.1–1.0)
Monocytes Relative: 5 %
Neutro Abs: 38.2 10*3/uL — ABNORMAL HIGH (ref 1.7–7.7)
Neutrophils Relative %: 83 %
Platelets: 156 10*3/uL (ref 150–400)
RBC: 3.08 MIL/uL — ABNORMAL LOW (ref 4.22–5.81)
RDW: 16.3 % — ABNORMAL HIGH (ref 11.5–15.5)
WBC: 46.3 10*3/uL — ABNORMAL HIGH (ref 4.0–10.5)
nRBC: 0.1 % (ref 0.0–0.2)

## 2020-02-23 LAB — COMPREHENSIVE METABOLIC PANEL
ALT: 78 U/L — ABNORMAL HIGH (ref 0–44)
AST: 35 U/L (ref 15–41)
Albumin: 1.4 g/dL — ABNORMAL LOW (ref 3.5–5.0)
Alkaline Phosphatase: 131 U/L — ABNORMAL HIGH (ref 38–126)
Anion gap: 7 (ref 5–15)
BUN: 37 mg/dL — ABNORMAL HIGH (ref 8–23)
CO2: 27 mmol/L (ref 22–32)
Calcium: 7.5 mg/dL — ABNORMAL LOW (ref 8.9–10.3)
Chloride: 103 mmol/L (ref 98–111)
Creatinine, Ser: 0.85 mg/dL (ref 0.61–1.24)
GFR calc Af Amer: >60 mL/min (ref >60)
GFR calc non Af Amer: >60 mL/min (ref >60)
Glucose, Bld: 107 mg/dL — ABNORMAL HIGH (ref 70–99)
Potassium: 4.4 mmol/L (ref 3.5–5.1)
Sodium: 137 mmol/L (ref 135–145)
Total Bilirubin: 0.5 mg/dL (ref 0.3–1.2)
Total Protein: 5 g/dL — ABNORMAL LOW (ref 6.5–8.1)

## 2020-02-23 LAB — TRIGLYCERIDES: Triglycerides: 93 mg/dL (ref <150)

## 2020-02-23 LAB — LEGIONELLA PNEUMOPHILA SEROGP 1 UR AG: L. pneumophila Serogp 1 Ur Ag: NEGATIVE

## 2020-02-23 LAB — CYCLIC CITRUL PEPTIDE ANTIBODY, IGG/IGA: CCP Antibodies IgG/IgA: 6 units (ref 0–19)

## 2020-02-23 LAB — CYTOLOGY - NON PAP

## 2020-02-23 LAB — APTT
aPTT: 79 seconds — ABNORMAL HIGH (ref 24–36)
aPTT: 82 seconds — ABNORMAL HIGH (ref 24–36)

## 2020-02-23 LAB — ANTINUCLEAR ANTIBODIES, IFA: ANA Ab, IFA: NEGATIVE

## 2020-02-23 LAB — GLUCOSE, CAPILLARY
Glucose-Capillary: 114 mg/dL — ABNORMAL HIGH (ref 70–99)
Glucose-Capillary: 126 mg/dL — ABNORMAL HIGH (ref 70–99)
Glucose-Capillary: 127 mg/dL — ABNORMAL HIGH (ref 70–99)
Glucose-Capillary: 179 mg/dL — ABNORMAL HIGH (ref 70–99)
Glucose-Capillary: 183 mg/dL — ABNORMAL HIGH (ref 70–99)
Glucose-Capillary: 201 mg/dL — ABNORMAL HIGH (ref 70–99)
Glucose-Capillary: 260 mg/dL — ABNORMAL HIGH (ref 70–99)

## 2020-02-23 LAB — TROPONIN I (HIGH SENSITIVITY): Troponin I (High Sensitivity): 145 ng/L (ref <18)

## 2020-02-23 LAB — LACTIC ACID, PLASMA: Lactic Acid, Venous: 1.5 mmol/L (ref 0.5–1.9)

## 2020-02-23 LAB — ANTI-DNA ANTIBODY, DOUBLE-STRANDED: ds DNA Ab: <1 IU/mL (ref 0–9)

## 2020-02-23 MED ORDER — SENNOSIDES-DOCUSATE SODIUM 8.6-50 MG PO TABS
2.0000 | ORAL_TABLET | Freq: Two times a day (BID) | ORAL | Status: DC
  Administered 2020-02-23 – 2020-03-02 (×16): 2
  Filled 2020-02-23 (×16): qty 2

## 2020-02-23 MED ORDER — ROCURONIUM BROMIDE 10 MG/ML (PF) SYRINGE
PREFILLED_SYRINGE | INTRAVENOUS | Status: AC
  Administered 2020-02-23: 12:00:00 100 mg
  Filled 2020-02-23: qty 10

## 2020-02-23 MED ORDER — VITAL AF 1.2 CAL PO LIQD
1000.0000 mL | ORAL | Status: DC
  Administered 2020-02-23 – 2020-02-26 (×4): 1000 mL
  Filled 2020-02-23: qty 1000

## 2020-02-23 MED ORDER — SODIUM CHLORIDE 0.9 % IV SOLN
25.0000 ug/min | INTRAVENOUS | Status: DC
  Filled 2020-02-23: qty 1

## 2020-02-23 MED ORDER — NOREPINEPHRINE 4 MG/250ML-% IV SOLN
0.0000 ug/min | INTRAVENOUS | Status: DC
  Administered 2020-02-23: 3 ug/min via INTRAVENOUS
  Administered 2020-02-24: 4 ug/min via INTRAVENOUS
  Administered 2020-02-25: 5 ug/min via INTRAVENOUS
  Administered 2020-02-27: 2 ug/min via INTRAVENOUS
  Administered 2020-02-28: 4 ug/min via INTRAVENOUS
  Administered 2020-02-28: 10 ug/min via INTRAVENOUS
  Administered 2020-02-29: 12 ug/min via INTRAVENOUS
  Administered 2020-03-01: 2 ug/min via INTRAVENOUS
  Filled 2020-02-23 (×7): qty 250

## 2020-02-23 MED ORDER — ACETAMINOPHEN 325 MG PO TABS
650.0000 mg | ORAL_TABLET | Freq: Four times a day (QID) | ORAL | Status: DC | PRN
  Administered 2020-03-01: 650 mg
  Filled 2020-02-23: qty 2

## 2020-02-23 MED ORDER — ONDANSETRON HCL 4 MG/2ML IJ SOLN: 4.0000 mg | Freq: Four times a day (QID) | INTRAMUSCULAR | Status: DC | PRN

## 2020-02-23 MED ORDER — SODIUM CHLORIDE 0.9% FLUSH
10.0000 mL | Freq: Two times a day (BID) | INTRAVENOUS | Status: DC
  Administered 2020-02-23 – 2020-02-29 (×10): 10 mL
  Administered 2020-03-01: 20 mL
  Administered 2020-03-02: 30 mL
  Administered 2020-03-02 – 2020-03-11 (×16): 10 mL

## 2020-02-23 MED ORDER — MIDAZOLAM HCL 2 MG/2ML IJ SOLN: 2.0000 mg | Freq: Once | INTRAMUSCULAR | Status: AC

## 2020-02-23 MED ORDER — ONDANSETRON HCL 4 MG PO TABS: 4.0000 mg | ORAL_TABLET | Freq: Four times a day (QID) | ORAL | Status: DC | PRN

## 2020-02-23 MED ORDER — POLYETHYLENE GLYCOL 3350 17 G PO PACK
17.0000 g | PACK | Freq: Every day | ORAL | Status: DC
  Administered 2020-02-24 – 2020-03-02 (×8): 17 g
  Filled 2020-02-23 (×8): qty 1

## 2020-02-23 MED ORDER — ROCURONIUM BROMIDE 50 MG/5ML IV SOLN
52.0000 mg | Freq: Once | INTRAVENOUS | Status: DC
  Filled 2020-02-23: qty 5.2

## 2020-02-23 MED ORDER — PRO-STAT SUGAR FREE PO LIQD: 30.0000 mL | Freq: Two times a day (BID) | ORAL | Status: DC

## 2020-02-23 MED ORDER — NOREPINEPHRINE 4 MG/250ML-% IV SOLN
INTRAVENOUS | Status: AC
  Administered 2020-02-23: 4 mg
  Filled 2020-02-23: qty 250

## 2020-02-23 MED ORDER — MIDODRINE HCL 5 MG PO TABS
10.0000 mg | ORAL_TABLET | Freq: Two times a day (BID) | ORAL | Status: DC
  Administered 2020-02-23 – 2020-03-09 (×29): 10 mg
  Filled 2020-02-23 (×29): qty 2

## 2020-02-23 MED ORDER — ROCURONIUM BROMIDE 50 MG/5ML IV SOLN: 50.0000 mg | Freq: Once | INTRAVENOUS | Status: AC

## 2020-02-23 MED ORDER — ACETAMINOPHEN 650 MG RE SUPP: 650.0000 mg | Freq: Four times a day (QID) | RECTAL | Status: DC | PRN

## 2020-02-23 MED ORDER — MIDAZOLAM HCL 2 MG/2ML IJ SOLN
INTRAMUSCULAR | Status: AC
  Filled 2020-02-23: qty 2

## 2020-02-23 MED ORDER — SODIUM CHLORIDE 0.9 % IV SOLN
2.0000 g | Freq: Two times a day (BID) | INTRAVENOUS | Status: DC
  Administered 2020-02-23 – 2020-02-27 (×8): 2 g via INTRAVENOUS
  Filled 2020-02-23 (×8): qty 2

## 2020-02-23 MED ORDER — LACTATED RINGERS IV BOLUS
500.0000 mL | Freq: Once | INTRAVENOUS | Status: AC
  Administered 2020-02-23: 500 mL via INTRAVENOUS

## 2020-02-23 MED ORDER — ADULT MULTIVITAMIN W/MINERALS CH
1.0000 | ORAL_TABLET | Freq: Every day | ORAL | Status: DC
  Administered 2020-02-24 – 2020-03-15 (×20): 1
  Filled 2020-02-23 (×20): qty 1

## 2020-02-23 MED ORDER — METRONIDAZOLE IN NACL 5-0.79 MG/ML-% IV SOLN
500.0000 mg | Freq: Three times a day (TID) | INTRAVENOUS | Status: DC
  Administered 2020-02-23 – 2020-02-27 (×12): 500 mg via INTRAVENOUS
  Filled 2020-02-23 (×12): qty 100

## 2020-02-23 MED ORDER — SODIUM CHLORIDE 0.9% FLUSH: 10.0000 mL | INTRAVENOUS | Status: DC | PRN

## 2020-02-23 NOTE — Progress Notes (Signed)
ANTICOAGULATION CONSULT NOTE   Pharmacy Consult for Heparin Indication: 01/27/20 small subacute PE  No Known Allergies  Patient Measurements: Height: 5\' 2"  (157.5 cm) Weight: 115 lb 4.8 oz (52.3 kg) IBW/kg (Calculated) : 54.6 Heparin Dosing Weight: 52kg  Vital Signs: Temp: 97.5 F (36.4 C) (03/01 0000) Temp Source: Axillary (03/01 0000) BP: 111/51 (03/01 0400) Pulse Rate: 54 (03/01 0400)  Labs: Recent Labs    02/20/20 1045 02/20/20 1327 02/21/20 0517 02/21/20 0517 02/22/20 0352 02/22/20 0352 02/22/20 1830 02/22/20 1830 02/22/20 2256 02/23/20 0420  HGB  --   --  9.1*   < > 9.7*   < > 9.9*   < > 9.2* 8.5*  HCT  --   --  27.9*   < > 30.7*   < > 29.0*  --  27.0* 27.0*  PLT  --   --  360  --  244  --   --   --   --  156  APTT  --   --   --   --   --   --   --   --   --  79*  HEPARINUNFRC  --  0.34  --   --   --   --   --   --   --   --   CREATININE 1.10  --   --   --  0.79  --   --   --   --  0.85   < > = values in this interval not displayed.    Estimated Creatinine Clearance: 53 mL/min (by C-G formula based on SCr of 0.85 mg/dL).   Assessment: 79 y.o. male with 01/27/20 small subacute PE, PTA Eliquis on hold, last dose 2/21. Started on heparin with low goal for H/H drop requiring transfusion on admit, stable H/H and platelets x5 days.  Initially switched back to apixaban 2/26, now NPO, pending possible intubation  Last dose apixaban last evening ~ 2200  CBC stable  3/1 AM update:  Initial aPTT therapeutic   Goal of Therapy:  Monitor platelets by anticoagulation protocol: Yes   Plan:  Cont heparin 1100 units/hr 1200 confirmatory aPTT/HL F/u plans to return to apixaban  3/26, PharmD, BCPS Clinical Pharmacist Phone: [phone]

## 2020-02-23 NOTE — Progress Notes (Signed)
eLink Physician-Brief Progress Note Patient Name: Bruce Webb DOB: August 26, 1941 MRN: [id number]   Date of Service  02/23/2020  HPI/Events of Note  OG tube in place, request order for it. CXR reviewed and tip of OG tube seen passed the diaphragm and at the gastric area.  eICU Interventions  Ordered OG and is to be clamped until need to use     Intervention Category Minor Interventions: Other:  Darl Pikes 02/23/2020, 12:01 AM

## 2020-02-23 NOTE — Progress Notes (Signed)
Peripherally Inserted Central Catheter/Midline Placement  The IV Nurse has discussed with the patient and/or persons authorized to consent for the patient, the purpose of this procedure and the potential benefits and risks involved with this procedure.  The benefits include less needle sticks, lab draws from the catheter, and the patient may be discharged home with the catheter. Risks include, but not limited to, infection, bleeding, blood clot (thrombus formation), and puncture of an artery; nerve damage and irregular heartbeat and possibility to perform a PICC exchange if needed/ordered by physician.  Alternatives to this procedure were also discussed.  Bard Power PICC patient education guide, fact sheet on infection prevention and patient information card has been provided to patient /or left at bedside.  Phone consent obtain from son Patil  PICC/Midline Placement Documentation  PICC Triple Lumen 02/23/20 PICC Right Basilic 34 cm 0 cm (Active)  Indication for Insertion or Continuance of Line Vasoactive infusions 02/23/20 1712  Exposed Catheter (cm) 0 cm 02/23/20 1712  Site Assessment Clean;Dry;Intact 02/23/20 1712  Lumen #1 Status Flushed;Saline locked;Blood return noted 02/23/20 1712  Lumen #2 Status Flushed;Saline locked;Blood return noted 02/23/20 1712  Lumen #3 Status Flushed;Saline locked;Blood return noted 02/23/20 1712  Dressing Type Transparent 02/23/20 1712  Dressing Status Clean;Dry;Intact;Antimicrobial disc in place 02/23/20 1712  Dressing Change Due 03/01/20 02/23/20 1712       Ethelda Chick 02/23/2020, 5:14 PM

## 2020-02-23 NOTE — Progress Notes (Signed)
Attempt to obtain phone consent from son.  Son states need to contact his brother Elva Breaker for consent   No answer  Will attempt again later.

## 2020-02-23 NOTE — Progress Notes (Signed)
Nutrition Follow-up / Consult  DOCUMENTATION CODES:   Not applicable  INTERVENTION:    Vital AF 1.2 at 50 ml/h (1200 ml per day)   Provides 1440 kcal, 90 gm protein, 973 ml free water daily  NUTRITION DIAGNOSIS:   Inadequate oral intake related to altered GI function as evidenced by NPO status.  Ongoing   GOAL:   Patient will meet greater than or equal to 90% of their needs  Progressing  MONITOR:   Vent status, TF tolerance, Labs  REASON FOR ASSESSMENT:   Consult Enteral/tube feeding initiation and management  ASSESSMENT:   79 yo male admitted with exertional dyspnea, anemia, hemoccult positive stools. PMH includes COVID and PE in January 2021, HTN, HF, DM-2, glaucoma.  Discussed patient in ICU rounds and with RN today. Patient was intubated 2/28. Plans to begin TF via OG tube, RD to order.  S/P palliative care meeting with family today to discuss goals of care; plans to continue full code for now.   Prior to intubation, patient was on a vegetarian diet, consuming 50-100% of meals.   Patient is currently intubated on ventilator support. MV: 11.8 L/min Temp (24hrs), Avg:98.8 F (37.1 C), Min:97.5 F (36.4 C), Max:99.3 F (37.4 C)  Propofol: 6.3 ml/hr providing 166 kcal from lipid  Labs reviewed. Na 134 (L) CBG's: 126-179  Medications reviewed and include novolog, solumedrol, MVI, miralax, senokot-s, neosynephrine, propofol.   Diet Order:   Diet Order            Diet NPO time specified  Diet effective ____              EDUCATION NEEDS:   Not appropriate for education at this time  Skin:  Skin Assessment: Skin Integrity Issues: Skin Integrity Issues:: Stage II Stage II: nose and ears  Last BM:  2/26  Height:   Ht Readings from Last 1 Encounters:  02/23/20 5\' 2"  (1.575 m)    Weight:   Wt Readings from Last 1 Encounters:  02/21/20 52.3 kg    Ideal Body Weight:  53.6 kg  BMI:  Body mass index is 21.09 kg/m.  Estimated  Nutritional Needs:   Kcal:  1485  Protein:  80-95 grams  Fluid:  > 1.6 L    02/23/20, RD, LDN, CNSC Please refer to Amion for contact information.

## 2020-02-23 NOTE — Progress Notes (Signed)
eLink Physician-Brief Progress Note Patient Name: Bruce Webb DOB: 1941-09-01 MRN: [id number]   Date of Service  02/23/2020  HPI/Events of Note  ABG on 100%/PRVC 26/TV 440/P 10 = 7.27/55/168/26. Ppeak = 39.  eICU Interventions  Will order: 1. Change ventilator to 100%/PCV with PC to match TV and Rate 28/P  10. 2. ABG in 1 hour.     Intervention Category Major Interventions: Acid-Base disturbance - evaluation and management;Respiratory failure - evaluation and management  Lenell Antu 02/23/2020, 11:45 PM

## 2020-02-23 NOTE — Progress Notes (Signed)
[facility] for Infectious Disease    Date of Admission:  02/15/2020   Total days of antibiotics 9 /day 2 of cefepime and day 2 of bactrim   ID: Bruce Webb is a 79 y.o. male with hx of covid pneumonia,and PE in january, admitted for worsening shortness of breath, respiratory failure requiring intubation. Also noted to have anemia with s/o tarry stools. Imaging finding c/w cavitary pneumonia. Recent vision loss due to worsening glaucoma Principal Problem:   Acute GI bleeding Active Problems:   Essential hypertension   Pulmonary embolism (HCC)   Pneumonia with cavity of lung   Hyponatremia   Hyperglycemia   Pressure injury of skin   Goals of care, counseling/discussion   Palliative care by specialist    Subjective: Remains intuated on full support at 100%FIO2  Medications:  . brimonidine  1 drop Both Eyes BID  . budesonide (PULMICORT) nebulizer solution  0.5 mg Nebulization BID  . chlorhexidine gluconate (MEDLINE KIT)  15 mL Mouth Rinse BID  . Chlorhexidine Gluconate Cloth  6 each Topical Daily  . dorzolamide  1 drop Both Eyes BID  . insulin aspart  0-9 Units Subcutaneous Q4H  . latanoprost  1 drop Both Eyes QHS  . mouth rinse  15 mL Mouth Rinse 10 times per day  . methylPREDNISolone (SOLU-MEDROL) injection  40 mg Intravenous Q12H  . midodrine  10 mg Per Tube BID WC  . [START ON 02/24/2020] multivitamin with minerals  1 tablet Per Tube Daily  . pantoprazole (PROTONIX) IV  40 mg Intravenous Q12H  . [START ON 02/24/2020] polyethylene glycol  17 g Per Tube Daily  . senna-docusate  2 tablet Per Tube BID  . sodium chloride flush  10-40 mL Intracatheter Q12H  . sodium chloride flush  3 mL Intravenous Q12H  . sodium chloride flush  3 mL Intravenous Q12H  . timolol  1 drop Both Eyes BID  . white petrolatum   Topical BID    Objective: Vital signs in last 24 hours: Temp:  [97.5 F (36.4 C)-99.3 F (37.4 C)] 98.6 F (37 C) (03/01 1600) Pulse Rate:  [52-96] 83 (03/01 1700)  Resp:  [8-30] 28 (03/01 1140) BP: (91-146)/(27-94) 144/41 (03/01 1700) SpO2:  [85 %-99 %] 99 % (03/01 1700) Arterial Line BP: (98-124)/(55-80) 124/55 (03/01 1700) FiO2 (%):  [80 %-100 %] 100 % (03/01 1615) Physical Exam  Constitutional: He is intubated, and sedated. He appears stated age and under-nourished. No distress.  HENT:  Mouth/Throat: OETT in place. No oropharyngeal exudate.  Cardiovascular: Normal rate, regular rhythm and normal heart sounds. Exam reveals no gallop and no friction rub.  No murmur heard.  Pulmonary/Chest: Effort normal and breath sounds bilaterally on anterior lung fields Abdominal: Soft. Bowel sounds are absent.  He exhibits no distension. There is no tenderness.  UVO:ZDGU line to right arm.  Skin: Skin is warm and dry. No rash noted. Scattered echymosis   Lab Results Recent Labs    02/22/20 0352 02/22/20 1830 02/23/20 0420 02/23/20 1441  WBC 37.8*  --  46.3*  --   HGB 9.7*   < > 8.5* 10.5*  HCT 30.7*   < > 27.0* 31.0*  NA 134*   < > 137 134*  K 4.8   < > 4.4 4.8  CL 102  --  103  --   CO2 26  --  27  --   BUN 44*  --  37*  --   CREATININE 0.79  --  0.85  --    < > = values in this interval not displayed.   Liver Panel Recent Labs    02/21/20 1205 02/21/20 1205 02/22/20 0352 02/23/20 0420  PROT 6.4*   < > 5.6* 5.0*  ALBUMIN 1.3*   < > 1.5* 1.4*  AST 77*   < > 98* 35  ALT 116*   < > 124* 78*  ALKPHOS 167*   < > 171* 131*  BILITOT 0.3   < > 0.5 0.5  BILIDIR 0.1  --   --   --   IBILI 0.2*  --   --   --    < > = values in this interval not displayed.   Sedimentation Rate Recent Labs    02/22/20 0352  ESRSEDRATE 98*    Microbiology: 2/28 BAL no growth at 24hr Studies/Results: Portable Chest x-ray  Result Date: 02/22/2020 CLINICAL DATA:  Cavitary pneumonia EXAM: PORTABLE CHEST 1 VIEW COMPARISON:  02/22/2020 FINDINGS: There are persistent diffuse bilateral airspace opacities, grossly similar to prior study. The endotracheal tube  terminates approximately 5.2 cm above the carina. The enteric tube extends below the left hemidiaphragm. The heart size is stable. There is no pneumothorax. No large pleural effusion. No acute osseous abnormality. IMPRESSION: Persistent diffuse bilateral airspace opacities, grossly similar to prior study. Endotracheal tube as above. Electronically Signed   By: Constance Holster M.D.   On: 02/22/2020 17:44   DG CHEST PORT 1 VIEW  Result Date: 02/22/2020 CLINICAL DATA:  Acute respiratory failure, hypoxia EXAM: PORTABLE CHEST 1 VIEW COMPARISON:  02/16/2020 FINDINGS: Patchy bilateral interstitial and alveolar airspace opacities involving the upper and lower lobes. Trace left pleural effusion. No significant right pleural effusion. No pneumothorax. Stable cardiomediastinal silhouette. No aggressive osseous lesion. IMPRESSION: Persistent patchy bilateral interstitial and alveolar airspace opacities most concerning for multilobar pneumonia including atypical viral pneumonia. Overall there is no significant interval change accounting for differences in technique. Electronically Signed   By: Kathreen Devoid   On: 02/22/2020 13:43   [place] EKG SITE RITE  Result Date: 02/22/2020 If [facility] image not attached, placement could not be confirmed due to current cardiac rhythm.    Assessment/Plan: Interstitial pneumonia with cavitary lesion = had immunosuppression with steroids during initial covid hospitalization. Unclear if this is post acute covid inflammatory process to lungs vs unveiling of underlying infection such as aspergillosis or mTB in terms of cavitary lesion vs. Bacterial process. Continue on cefepime. Will add metronidazole for anaerobic coverage  Patient also worsening progressive symptoms could be consistent with PJP. For now, continue on iv bactrim  -we will check B-d glucan, aspergillus ag, awaiting PJP stain, afb and QTF is pending as well  Leukocytosis = suspect elevated from underlying infection   Respiratory failure, vent support =defer to PCCM for management  Overall condition guarded  [facility] Cell: [phone] Pager: [phone]  02/23/2020, 5:44 PM

## 2020-02-23 NOTE — Progress Notes (Signed)
NAME:  Bruce Webb, MRN:  [id number], DOB:  [birth year], LOS: 8 ADMISSION DATE:  02/15/2020, CONSULTATION DATE:  02/19/2020 REFERRING MD:  Dr. Karleen Hampshire, CHIEF COMPLAINT:  Hypotension/ hypoxia    Brief History   79 year old male post COVID and PE in January on Eliquis admitted 2/21 to [facility] for extertional dyspnea found to be anemic with hemoccult positive stools.  GI consulted and transfused PRBC with since stabilized Hgb but has had persistent higher O2 requirements.  Was diuresed but today developed hypotension, with ongoing higher O2 requirements, PCCM was asked to consult.     History of present illness   HPI obtained via interpreter and from daughter.   79 year old male, who speaks [place], with history of HTN, HFpEF, DMT2, glaucoma with recent COVID 01/16/2020 with subsequent subacute PE on Eliquis and home O2 2L [place] who presented 2/21 with exertional dyspnea, black tarry stools, and worsening vision loss in his right eye.  Patient lives at home with his daughter and pre-COVID was independent in his ADLs.  He is a retired and prior worked in an office.  Denies any smoking or pulmonary history.  Reports prior to COVID, had a chronic non-productive cough for 10-15 years and his primary care MD treated it occasionally with cough medicine.   Patient with recent hospitalization at [facility] for [place] after initial positive 1/22 for failed outpatient treatment for COVID infection despite monoclonal antibodies and steroids.  While at [facility], treated with remdesivir and decadron and found to have a PE and was discharged home on 2/6 on 2L O2 and Eliquis.   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    On admit, found to have worsening anemia, 8.8-> 6 with positive fecal occult blood testing and hypotension responsive to fluid.  Additionally found to have WBC 28k, CXR with worsening multifocal airspace consolidation bilaterally.  He was started  empirically on zithromax and cefepime. PCT 0.5.  He was transfused with 2 units PRBC, started on protonix, and GI consulted. However, given tenuous respiratory status and increased O2 needs, GI deferred EGD.  Hgb trend since stable and therefore placed on heparin gtt on 2/23.  He was diuresed on 2/23 given fluid overload post blood transfusion with good output then but has since required midodrine to help with soft blood pressures.  On 2/25, he had worsening hypotension despite midodrine, ongoing elevated O2 needs, and transaminitis, PCCM asked to consult.     Past Medical History  HTN, HFpEF, DMT2, glaucoma with progressive vision loss (left, now rapid loss in right) - recent COVID 01/16/2020 with subsequent PE on Eliquis and O2 2L [place]  Significant Hospital Events   2/21 admit. Stool ob =negatoive  2/23- echo ef 65%  2/25 PCCM consulted for hypotension and persistent hypoxemia  2/26 - Has severe desaturations with activity and eating. Required 10L while eating his meal today. No acute distress but continues to have shortness of breath. Denies chest pain.   2/27 -  -remains on 10 L.  He easily desaturates.  [name] at the bedside.  I spoke to her and subsequently also to their family friend who is in physician assistant in cardiology [place]).  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  According to the daughter he got worse with COVID-19 then got better went home but he declined again in terms of his hypoxemia.  I personally visualized the current  CT scan of the chest compared to 4 weeks ago and to my visualization it looks like an extension of his pulmonary infiltrates from the original COVID-19 admission including a right right upper lobe anterior segment cavity thick-walled.   Consults:  GI  PCCM  Procedures:   Significant Diagnostic Tests:  2/21 CTH >> 1. No acute intracranial abnormalities. 2. Mild cerebral atrophy with chronic  microvascular ischemic changes in the cerebral white matter, as above.  CTA 01/27/20 - Diffuse subpleural/peripheral interstitial ground glass opacities with increased involvement in the the lower lobes bilaterally R>L. CXR 02/16/20 R>L multifocal pneumonia R>L, no edema or effusion  2/23 TTE >> 1. Left ventricular ejection fraction, by estimation, is 60 to 65%. The left ventricle has normal function. The left ventricle has no regional wall motion abnormalities. Left ventricular diastolic parameters are consistent with Grade II diastolic dysfunction (pseudonormalization).  2. The history indicates the patient has a pulmonary embolus. The RV is  not dilated and there is normal RV function. . Right ventricular systolic function is low normal. The right ventricular size is normal.  3. The mitral valve is normal in structure and function. Mild mitral valve regurgitation. No evidence of mitral stenosis.  4. The aortic valve is normal in structure and function. Aortic valve regurgitation is not visualized. No aortic stenosis is present.   Micro Data:  2/21 BCx 2 >> ngtd x 4 days 2/25 - CCP 2/27 - quant gold -  2/27 RVP - negative 2/28  - urine strep - negative 228 - urine leg -  2/28 - aspergillus Ab -  xxxxxxxxxxxx 3/1 BAL Cx, fungal, acid fast >> 3/1 BAL PCP>>  Antimicrobials:  2/21 azithro>>2/26 2/21 ceftriaxone  2/22 cefepime >> 2/28 Bactrim >  Interim history/subjective:  Transferred to the ICU yesterday. Intubated and bronched. Became hypotensive post-procedure. Started on Neo gtt  Objective   Blood pressure (!) 113/52, pulse 62, temperature 99.3 F (37.4 C), temperature source Oral, resp. rate 10, height _0  (1.575 m), weight 52.3 kg, SpO2 93 %.    Vent Mode: PRVC FiO2 (%):  [80 %-100 %] 80 % Set Rate:  [24 bmp] 24 bmp Vt Set:  [440 mL-4740 mL] 4740 mL PEEP:  [5 cmH20] 5 cmH20 Plateau Pressure:  [21 cmH20-28 cmH20] 21 cmH20   Intake/Output Summary (Last 24 hours) at  02/23/2020 1043 Last data filed at 02/23/2020 0800 Gross per 24 hour  Intake 2989.98 ml  Output 500 ml  Net 2489.98 ml   Filed Weights   02/19/20 0659 02/20/20 0411 02/21/20 0421  Weight: 49.6 kg 50.3 kg 52.3 kg   Physical Exam: General: Chronically ill-appearing, frail male, on mechanical ventilation HENT: [place], AT, ETT in place Eyes: EOMI, no scleral icterus Respiratory: Diffuse ilateral crackles, no wheezing or rhonchi Cardiovascular: RRR, -M/R/G, no JVD GI: BS+, soft, nontender Extremities:-Edema,-tenderness Neuro: Sedated, opens eyes to touch GU: Foley in place  CXR 3/1 - Persistent bilateral opacities  Resolved Hospital Problem list    Assessment & Plan:  79 year old male never smoker with prior COVID-19 infection diagnosed in January and requiring hospitalization from 2/1-2/6, recently diagnosed pulmonary emboli on anticoagulation and end-stage glaucoma with right vision loss in addition to his known left vision loss who presents with worsening shortness of breath and admitted for acute on chronic respiratory failure secondary to multifocal pneumonia. Has had required high O2 requirements during the course of this hospitalization despite steroid therapy and transferred to the ICU on 2/28.  ARDS/Acute hypoxemic respiratory  failure secondary to cavitary pneumonia. Likely multifactorial with bacterial/necrotizing pneumonia in setting of post-inflammatory COVID-19 infection vs pulmonary fibrosis, atelectasis, pulmonary emboli. Low suspicion for TB even though he is from an endemic area; PPD neg and immigrated >10 years. BAL cell count significant for neutrophilic predominance suggesting bacterial infection. Autoimmune work-up, Quantiferon gold and aspergillus ab pending. --Full vent support --Appreciate ID input. On Bactrim for possible PJP. Continue Cefepime. --F/u cultures including BAL --F/u urine legionella --Continue BID solumedrol --PAD protocol: Wean fentanyl and propofol for  goal RASS -1  Septic shock secondary to bacterial pneumonia. LA wnl --Continue antibiotics per ID --On neo gtt for MAP goal >65 --Awaiting PICC for access. Will switch to levophed once access is obtained. --LR bolus now --F/u cultures  Right pulmonary emboli --Continue heparin gtt --Trend CBC with hx of melena prior to admission  Acute blood loss anemia, melena. S/p 2U PRBC and Hg stable --Trend CBC --GI consulted but unable to perform EGD due to respiratory status --PPID BID  Transaminitis. Improving. RUQ unrevealing --Trend LFTs  Chronic diastolic heart failure  Acute urinary retention 2/27 --Foley in place  GOC --Guarded prognosis in setting of respiratory failure in patient with baseline lung function since his COVID infection. Appreciate palliative consult.  Best practice:  Diet: TF Pain/Anxiety/Delirium protocol (if indicated): yes VAP protocol (if indicated): yes DVT prophylaxis: heparin gtt (stop eliquis) GI prophylaxis: PPI bid dosing Glucose control: SSI Mobility: push  Code Status: Full  Family Communication:d.w Rowe Pavy the PA in cards who his husband ofpatient's distance niece and is family spokesperson due to medical knowledge  Disposition: ICU  SIGNATURE   The patient is critically ill with multiple organ systems failure and requires high complexity decision making for assessment and support, frequent evaluation and titration of therapies, application of advanced monitoring technologies and extensive interpretation of multiple databases.   Critical Care Time devoted to patient care services described in this note is 40 Minutes. This time reflects time of care of this signee Dr. Rodman Pickle.  Rodman Pickle, M.D. [facility] Pulmonary/Critical Care Medicine 02/23/2020 11:18 AM   Please see Amion for pager number to reach on-call Pulmonary and Critical Care Team.  LABS    PULMONARY Recent Labs  Lab 02/22/20 1045 02/22/20 1830 02/22/20 2256    PHART 7.485* 7.338* 7.419  PCO2ART 33.4 59.3* 45.9  PO2ART 47.4* 71.0* 75.0*  HCO3 25.0 31.8* 29.9*  TCO2  --  34* 31  O2SAT 83.7 92.0 95.0    CBC Recent Labs  Lab 02/21/20 0517 02/21/20 0517 02/22/20 0352 02/22/20 0352 02/22/20 1830 02/22/20 2256 02/23/20 0420  HGB 9.1*   < > 9.7*   < > 9.9* 9.2* 8.5*  HCT 27.9*   < > 30.7*   < > 29.0* 27.0* 27.0*  WBC 28.1*  --  37.8*  --   --   --  46.3*  PLT 360  --  244  --   --   --  156   < > = values in this interval not displayed.    COAGULATION Recent Labs  Lab 02/19/20 1635  INR 1.2    CARDIAC  No results for input(s): TROPONINI in the last 168 hours. No results for input(s): PROBNP in the last 168 hours.   CHEMISTRY Recent Labs  Lab 02/18/20 0505 02/18/20 0505 02/19/20 0315 02/19/20 0315 02/20/20 1045 02/20/20 1045 02/22/20 0352 02/22/20 0352 02/22/20 1830 02/22/20 1830 02/22/20 2256 02/23/20 0420  NA 131*   < > 135   < >  137  --  134*  --  137  --  136 137  K 3.0*   < > 4.1   < > 4.6   < > 4.8   < > 4.7   < > 4.6 4.4  CL 98  --  99  --  103  --  102  --   --   --   --  103  CO2 25  --  27  --  26  --  26  --   --   --   --  27  GLUCOSE 154*  --  117*  --  180*  --  161*  --   --   --   --  107*  BUN 26*  --  28*  --  30*  --  44*  --   --   --   --  37*  CREATININE 1.00  --  1.07  --  1.10  --  0.79  --   --   --   --  0.85  CALCIUM 7.3*  --  7.7*  --  8.1*  --  8.5*  --   --   --   --  7.5*  MG 1.7  --   --   --  2.0  --   --   --   --   --   --   --    < > = values in this interval not displayed.   Estimated Creatinine Clearance: 53 mL/min (by C-G formula based on SCr of 0.85 mg/dL).   LIVER Recent Labs  Lab 02/17/20 0715 02/19/20 0315 02/19/20 1635 02/21/20 1205 02/22/20 0352 02/23/20 0420  AST 186* 189*  --  77* 98* 35  ALT 158* 169*  --  116* 124* 78*  ALKPHOS 161* 147*  --  167* 171* 131*  BILITOT 1.1 0.8  --  0.3 0.5 0.5  PROT 4.6* 5.1*  --  6.4* 5.6* 5.0*  ALBUMIN 1.3* 1.3*  --   1.3* 1.5* 1.4*  INR  --   --  1.2  --   --   --      INFECTIOUS Recent Labs  Lab 02/17/20 0715 02/19/20 1202 02/19/20 1635 02/23/20 0420  LATICACIDVEN  --  2.4* 1.9 1.5  PROCALCITON 0.50  --  0.99  --      ENDOCRINE CBG (last 3)  Recent Labs    02/23/20 0022 02/23/20 0431 02/23/20 0838  GLUCAP 260* 114* 127*         IMAGING x48h  - image(s) personally visualized  -   highlighted in bold Portable Chest x-ray  Result Date: 02/22/2020 CLINICAL DATA:  Cavitary pneumonia EXAM: PORTABLE CHEST 1 VIEW COMPARISON:  02/22/2020 FINDINGS: There are persistent diffuse bilateral airspace opacities, grossly similar to prior study. The endotracheal tube terminates approximately 5.2 cm above the carina. The enteric tube extends below the left hemidiaphragm. The heart size is stable. There is no pneumothorax. No large pleural effusion. No acute osseous abnormality. IMPRESSION: Persistent diffuse bilateral airspace opacities, grossly similar to prior study. Endotracheal tube as above. Electronically Signed   By: Constance Holster M.D.   On: 02/22/2020 17:44   DG CHEST PORT 1 VIEW  Result Date: 02/22/2020 CLINICAL DATA:  Acute respiratory failure, hypoxia EXAM: PORTABLE CHEST 1 VIEW COMPARISON:  02/16/2020 FINDINGS: Patchy bilateral interstitial and alveolar airspace opacities involving the upper and lower lobes. Trace left pleural effusion. No significant right pleural effusion. No pneumothorax.  Stable cardiomediastinal silhouette. No aggressive osseous lesion. IMPRESSION: Persistent patchy bilateral interstitial and alveolar airspace opacities most concerning for multilobar pneumonia including atypical viral pneumonia. Overall there is no significant interval change accounting for differences in technique. Electronically Signed   By: Kathreen Devoid   On: 02/22/2020 13:43   [place] EKG SITE RITE  Result Date: 02/22/2020 If [facility] image not attached, placement could not be confirmed due to  current cardiac rhythm.

## 2020-02-23 NOTE — Consult Note (Signed)
Consultation Note Date: 02/23/2020   Patient Name: Bruce Webb  DOB: February 08, 1941  MRN: [id number]  Age / Sex: 79 y.o., male  PCP: Glenda Chroman, MD Referring Physician: Margaretha Seeds, MD  Reason for Consultation: Establishing goals of care  HPI/Patient Profile: 79 y.o. male  with past medical history of hypertension, diet-controlled diabetes, history of recent Covid infection 1/22 admitted on 02/15/2020 with pneumonia due to COVID-19.   Clinical Assessment and Goals of Care: I have reviewed medical records including EPIC notes, labs and imaging, received report from bedside nursing staff and attending, examined the patient and call to family, son [name], to discuss diagnosis prognosis, [place], EOL wishes, disposition and options.  I introduced Palliative Medicine as specialized medical care for people living with serious illness. It focuses on providing relief from the symptoms and stress of a serious illness.   Fio2 100%,  BP 110/50  We discussed a brief life review of the patient.  [name] lived at home with his wife, son, and daughter and prior to his Covid illness he was independent with ADLs.  We discussed current illness and what it means in the larger context of on-going co-morbidities.  Natural disease trajectory and expectations at EOL were discussed.   We talked about ventilator support with FiO2 at 100% and saturation at 95%.  We also talked about blood pressure of approximately 110/50 with weighty vasopressor support.  I try to relate the severity of Mr. Space illness to his son.  [name] son requests that I call his uncle to explain.  Advanced directives, concepts specific to code status, were considered and discussed.  I share my concern about [name] being maxed out with oxygen (FiO2 at 100%), and the use of high-dose vasopressors for blood pressure control.  I share my concern that  if what we are doing is not working and his heart naturally stops, CPR would cause him to suffer at end-of-life.  [name] son asked that I call his uncle, his mother's brother [name] at [phone].  Call to number, left voicemail message requesting return call.  I tried to address questions and concerns, but several times Mr. Stmarie son requested that I called his uncle Bay Wayson.   The family was encouraged to call with questions or concerns.   Call to patient's nephew, [name], [phone],SWN "works at [employer]" per Mr. Blank son.  Left somewhat generic voicemail message requesting return phone call.  Second call to [name], who tells me that he is a cardiology PA with [employer].  We talk in detail about the severity of Mr. Hawker illness, his decline.  [name] tells me that at this time family would like to continue full scope full code.  He will discuss changes with family this afternoon and follow-up tomorrow.  He mentions that [name] has a son that is still in [place] and he feels that maybe family is waiting for son to arrive.  Attending updated via chat.  PMT to  follow.   HCPOA   NEXT OF KIN - wife [name];  Son [name]; 2 daughters, all live together.   [place], [place], (Savita's brother) "works at [employer]" per Mr. Pariseau son.      SUMMARY OF RECOMMENDATIONS   At this point full scope/full code Continue CODE STATUS discussions  Code Status/Advance Care Planning:  Full code - I share my worry that we have maximized medical treatments.  We briefly talk about CODE STATUS, but [name] son requests that I call [name] brother-in-law  Symptom Management:   Per CCM, no additional needs at this time.  Palliative Prophylaxis:   Oral Care and Turn Reposition  Additional Recommendations (Limitations, Scope, Preferences):  Full Scope Treatment  Psycho-social/Spiritual:   Desire for further Chaplaincy  support:no  Additional Recommendations: Caregiving  Support/Resources and ICU Family Guide  Prognosis:   Unable to determine, based on outcomes.  Guarded at this time.  Discharge Planning: To be determined, based on outcomes.      Primary Diagnoses: Present on Admission: . Acute GI bleeding . Essential hypertension . Pulmonary embolism ([place]) . Hyponatremia . Hyperglycemia   I have reviewed the medical record, interviewed the patient and family, and examined the patient. The following aspects are pertinent.  Past Medical History:  Diagnosis Date  . Hypertension    Social History   Socioeconomic History  . Marital status: Married    Spouse name: Not on file  . Number of children: Not on file  . Years of education: Not on file  . Highest education level: Not on file  Occupational History  . Not on file  Tobacco Use  . Smoking status: Never Smoker  . Smokeless tobacco: Never Used  Substance and Sexual Activity  . Alcohol use: Not on file  . Drug use: Not on file  . Sexual activity: Not on file  Other Topics Concern  . Not on file  Social History Narrative  . Not on file   Social Determinants of Health   Financial Resource Strain:   . Difficulty of Paying Living Expenses: Not on file  Food Insecurity:   . Worried About Charity fundraiser in the Last Year: Not on file  . Ran Out of Food in the Last Year: Not on file  Transportation Needs:   . Lack of Transportation (Medical): Not on file  . Lack of Transportation (Non-Medical): Not on file  Physical Activity:   . Days of Exercise per Week: Not on file  . Minutes of Exercise per Session: Not on file  Stress:   . Feeling of Stress : Not on file  Social Connections:   . Frequency of Communication with Friends and Family: Not on file  . Frequency of Social Gatherings with Friends and Family: Not on file  . Attends Religious Services: Not on file  . Active Member of Clubs or Organizations: Not on file  .  Attends Archivist Meetings: Not on file  . Marital Status: Not on file   Family History  Problem Relation Age of Onset  . Diabetes Neg Hx    Scheduled Meds: . brimonidine  1 drop Both Eyes BID  . budesonide (PULMICORT) nebulizer solution  0.5 mg Nebulization BID  . chlorhexidine gluconate (MEDLINE KIT)  15 mL Mouth Rinse BID  . Chlorhexidine Gluconate Cloth  6 each Topical Daily  . dorzolamide  1 drop Both Eyes BID  . insulin aspart  0-9 Units  Subcutaneous Q4H  . latanoprost  1 drop Both Eyes QHS  . mouth rinse  15 mL Mouth Rinse 10 times per day  . methylPREDNISolone (SOLU-MEDROL) injection  40 mg Intravenous Q12H  . midodrine  10 mg Oral BID WC  . multivitamin with minerals  1 tablet Oral Daily  . pantoprazole (PROTONIX) IV  40 mg Intravenous Q12H  . polyethylene glycol  17 g Oral Daily  . senna-docusate  2 tablet Oral BID  . sodium chloride flush  3 mL Intravenous Q12H  . sodium chloride flush  3 mL Intravenous Q12H  . timolol  1 drop Both Eyes BID  . white petrolatum   Topical BID   Continuous Infusions: . sodium chloride 10 mL/hr at 02/23/20 0104  . sodium chloride Stopped (02/23/20 0646)  . ceFEPime (MAXIPIME) IV    . fentaNYL infusion INTRAVENOUS 200 mcg/hr (02/23/20 1000)  . heparin 1,100 Units/hr (02/23/20 1000)  . phenylephrine (NEO-SYNEPHRINE) Adult infusion 85.333 mcg/min (02/23/20 1100)  . propofol (DIPRIVAN) infusion 20 mcg/kg/min (02/23/20 1000)  . sulfamethoxazole-trimethoprim 261.44 mg (02/23/20 1154)   PRN Meds:.sodium chloride, acetaminophen **OR** acetaminophen, docusate, fentaNYL, fentaNYL (SUBLIMAZE) injection, fentaNYL (SUBLIMAZE) injection, midazolam, midazolam, ondansetron **OR** ondansetron (ZOFRAN) IV, sodium chloride flush Medications Prior to Admission:  Prior to Admission medications   Medication Sig Start Date End Date Taking? Authorizing Provider  acetaminophen (TYLENOL) 325 MG tablet Take 650 mg by mouth every 6 (six) hours as  needed for mild pain or fever.   Yes [provider]  apixaban (ELIQUIS) 5 MG TABS tablet Take 5 mg by mouth 2 (two) times daily.   Yes [provider]  Brinzolamide-Brimonidine (SIMBRINZA) 1-0.2 % SUSP Place 1 drop into both eyes daily.   Yes [provider]  Carboxymethylcellulose Sod PF 0.5 % SOLN Place 1 drop into both eyes at bedtime.   Yes [provider]  ferrous sulfate 325 (65 FE) MG tablet Take 1 tablet (325 mg total) by mouth 2 (two) times daily with a meal. 01/31/20  Yes Amin, Ankit Chirag, MD  Ipratropium-Albuterol (COMBIVENT) 20-100 MCG/ACT AERS respimat Inhale 1 puff into the lungs every 6 (six) hours as needed for wheezing. 01/31/20  Yes Amin, Ankit Chirag, MD  latanoprost (XALATAN) 0.005 % ophthalmic solution Place 1 drop into both eyes at bedtime.   Yes [provider]  lisinopril (ZESTRIL) 10 MG tablet Take 10-20 mg by mouth See admin instructions. Take '20mg'$  tablet by mouth in am & then take '10mg'$  tablet by mouth in evening 11/25/19  Yes [provider]  senna-docusate (SENOKOT-S) 8.6-50 MG tablet Take 2 tablets by mouth at bedtime as needed for mild constipation or moderate constipation. 01/31/20  Yes Amin, Ankit Chirag, MD  Timolol-Brimonidine-Dorzolamid 0.5-0.15-2 % SOLN Place 1 drop into both eyes daily.   Yes [provider]  apixaban (ELIQUIS) 5 MG TABS tablet Take 2 tablets (10 mg total) by mouth 2 (two) times daily for 5 days, THEN 1 tablet (5 mg total) 2 (two) times daily for 25 days. Patient not taking: Reported on 02/16/2020 01/31/20 03/01/20  Damita Lack, MD  polyethylene glycol (MIRALAX / GLYCOLAX) 17 g packet Take 17 g by mouth daily as needed for moderate constipation or severe constipation. Patient not taking: Reported on 02/16/2020 01/31/20   Damita Lack, MD   No Known Allergies Review of Systems  Unable to perform ROS: Intubated    Physical Exam Vitals and nursing note reviewed.  Constitutional:       Comments:  Appears acutely/chronically ill and frail  Cardiovascular:     Rate and Rhythm: Normal rate.  Pulmonary:     Comments: Intubated/ventilated Neurological:     Comments: Intubated/ventilated, sedated     Vital Signs: BP (!) 111/49   Pulse 88   Temp 99.3 F (37.4 C) (Oral)   Resp (!) 28   Ht '5\' 2"'$  (1.575 m)   Wt 52.3 kg   SpO2 98%   BMI 21.09 kg/m  Pain Scale: CPOT   Pain Score: 0-No pain   SpO2: SpO2: 98 % O2 Device:SpO2: 98 % O2 Flow Rate: .O2 Flow Rate (L/min): 15 L/min  IO: Intake/output summary:   Intake/Output Summary (Last 24 hours) at 02/23/2020 1305 Last data filed at 02/23/2020 1000 Gross per 24 hour  Intake 3319.34 ml  Output 50 ml  Net 3269.34 ml    LBM: Last BM Date: 02/20/20 Baseline Weight: Weight: 67.2 kg Most recent weight: Weight: 52.3 kg     Palliative Assessment/Data:   Flowsheet Rows     Most Recent Value  Intake Tab  Referral Department  Hospitalist  Unit at Time of Referral  ICU  Palliative Care Primary Diagnosis  Pulmonary  Date Notified  02/22/20  Palliative Care Type  New Palliative care  Reason for referral  Clarify Goals of Care  Date of Admission  02/15/20  Date first seen by Palliative Care  02/23/20  # of days Palliative referral response time  1 Day(s)  # of days IP prior to Palliative referral  7  Clinical Assessment  Palliative Performance Scale Score  20%  Pain Max last 24 hours  Not able to report  Pain Min Last 24 hours  Not able to report  Dyspnea Max Last 24 Hours  Not able to report  Dyspnea Min Last 24 hours  Not able to report  Psychosocial & Spiritual Assessment  Palliative Care Outcomes      Time In: 1220 Time Out: 1330 Time Total: 70 minutes  Greater than 50%  of this time was spent counseling and coordinating care related to the above assessment and plan.  Signed by: Drue Novel, NP   Please contact Palliative Medicine Team phone at [phone] for questions and concerns.  For individual  provider: See Shea Evans

## 2020-02-23 NOTE — Progress Notes (Signed)
Pharmacy Antibiotic Note  Bruce Webb is a 79 y.o. male admitted on 02/15/2020 with cavitary pneumonia. Patient had recent covid infection with steroid use  ID following with concern for PCP, Mycobacterial disease, or fungal infection  Patient likely needs bronch but needs family decision on intubation first  SCr stable, CrCl = 53 ml/min. K wnl.  Plan: Adjust cefepime to 2 g q12h Bactrim 15 mg/kg/day div q8h F/u cx, bal, ID recs Watch renal fx, K while on bactrim  Height: _0  (157.5 cm) Weight: 115 lb 4.8 oz (52.3 kg) IBW/kg (Calculated) : 54.6  Temp (24hrs), Avg:98.6 F (37 C), Min:97.5 F (36.4 C), Max:99.3 F (37.4 C)  Recent Labs  Lab 02/18/20 0505 02/18/20 0505 02/19/20 0315 02/19/20 1202 02/19/20 1635 02/20/20 0509 02/20/20 1045 02/21/20 0517 02/22/20 0352 02/23/20 0420  WBC 29.4*   < > 26.2*  --   --  25.4*  --  28.1* 37.8* 46.3*  CREATININE 1.00  --  1.07  --   --   --  1.10  --  0.79 0.85  LATICACIDVEN  --   --   --  2.4* 1.9  --   --   --   --  1.5   < > = values in this interval not displayed.    Estimated Creatinine Clearance: 53 mL/min (by C-G formula based on SCr of 0.85 mg/dL).    No Known Allergies  Berenice Bouton, PharmD PGY1 Pharmacy Resident  Please check AMION for all [place] phone numbers After 10:00 PM, call [place] [phone]   02/23/2020 9:09 AM

## 2020-02-23 NOTE — Progress Notes (Signed)
ANTICOAGULATION CONSULT NOTE   Pharmacy Consult for Heparin Indication: 01/27/20 small subacute PE  No Known Allergies  Patient Measurements: Height: 5\' 2"  (157.5 cm) Weight: 115 lb 4.8 oz (52.3 kg) IBW/kg (Calculated) : 54.6 Heparin Dosing Weight: 52kg  Vital Signs: Temp: 99.3 F (37.4 C) (03/01 0844) Temp Source: Oral (03/01 0844) BP: 111/49 (03/01 1200) Pulse Rate: 88 (03/01 1200)  Labs: Recent Labs    02/21/20 0517 02/21/20 0517 02/22/20 0352 02/22/20 0352 02/22/20 1830 02/22/20 1830 02/22/20 2256 02/23/20 0420 02/23/20 1240  HGB 9.1*   < > 9.7*   < > 9.9*   < > 9.2* 8.5*  --   HCT 27.9*   < > 30.7*   < > 29.0*  --  27.0* 27.0*  --   PLT 360  --  244  --   --   --   --  156  --   APTT  --   --   --   --   --   --   --  79* 82*  HEPARINUNFRC  --   --   --   --   --   --   --  0.64 0.63  CREATININE  --   --  0.79  --   --   --   --  0.85  --   TROPONINIHS  --   --   --   --   --   --   --  145*  --    < > = values in this interval not displayed.    Estimated Creatinine Clearance: 53 mL/min (by C-G formula based on SCr of 0.85 mg/dL).   Assessment: 79 y.o. male with 01/27/20 small subacute PE, PTA Eliquis on hold, last dose 2/21. Started on heparin with low goal for H/H drop requiring transfusion on admit, stable H/H and platelets x5 days. Switched back to Apixaban 2/26 >> 2/27, back to Heparin IV on 2/28 due to intubation.   The patient's aPTT this afternoon remains therapeutic (aPTT 82 << 79, goal of 66-85),Heparin level appears to still show some false elevation.  Hgb 8.5 << 9.2, plts wnl. No issues or bleeding noted at this time.   Goal of Therapy:  Monitor platelets by anticoagulation protocol: Yes   Plan:  - Continue Heparin at 1100 units/hr - Will continue to monitor for any signs/symptoms of bleeding and will follow up with an aPTT and heparin level in the a.m.   Thank you for allowing pharmacy to be a part of this patient's care.  3/28,  PharmD, BCPS Clinical Pharmacist Clinical phone for 02/23/2020: [phone] 02/23/2020 2:04 PM   **Pharmacist phone directory can now be found on [email or web address] ([email or web address]).  Listed under [facility].

## 2020-02-24 LAB — POCT I-STAT 7, (LYTES, BLD GAS, ICA,H+H)
Acid-base deficit: 2 mmol/L (ref 0.0–2.0)
Acid-base deficit: 2 mmol/L (ref 0.0–2.0)
Acid-base deficit: 4 mmol/L — ABNORMAL HIGH (ref 0.0–2.0)
Acid-base deficit: 5 mmol/L — ABNORMAL HIGH (ref 0.0–2.0)
Bicarbonate: 25.4 mmol/L (ref 20.0–28.0)
Bicarbonate: 24.8 mmol/L (ref 20.0–28.0)
Bicarbonate: 22.2 mmol/L (ref 20.0–28.0)
Bicarbonate: 19.9 mmol/L — ABNORMAL LOW (ref 20.0–28.0)
Calcium, Ion: 1.2 mmol/L (ref 1.15–1.40)
Calcium, Ion: 1.16 mmol/L (ref 1.15–1.40)
Calcium, Ion: 1.14 mmol/L — ABNORMAL LOW (ref 1.15–1.40)
Calcium, Ion: 1.12 mmol/L — ABNORMAL LOW (ref 1.15–1.40)
HCT: 24 % — ABNORMAL LOW (ref 39.0–52.0)
HCT: 25 % — ABNORMAL LOW (ref 39.0–52.0)
HCT: 33 % — ABNORMAL LOW (ref 39.0–52.0)
HCT: 24 % — ABNORMAL LOW (ref 39.0–52.0)
Hemoglobin: 8.2 g/dL — ABNORMAL LOW (ref 13.0–17.0)
Hemoglobin: 8.5 g/dL — ABNORMAL LOW (ref 13.0–17.0)
Hemoglobin: 11.2 g/dL — ABNORMAL LOW (ref 13.0–17.0)
Hemoglobin: 8.2 g/dL — ABNORMAL LOW (ref 13.0–17.0)
O2 Saturation: 99 %
O2 Saturation: 97 %
O2 Saturation: 93 %
O2 Saturation: 94 %
Patient temperature: 97.6
Patient temperature: 97.6
Patient temperature: 98.5
Patient temperature: 98.6
Potassium: 4.6 mmol/L (ref 3.5–5.1)
Potassium: 4.8 mmol/L (ref 3.5–5.1)
Potassium: 4.6 mmol/L (ref 3.5–5.1)
Potassium: 4.8 mmol/L (ref 3.5–5.1)
Sodium: 131 mmol/L — ABNORMAL LOW (ref 135–145)
Sodium: 131 mmol/L — ABNORMAL LOW (ref 135–145)
Sodium: 129 mmol/L — ABNORMAL LOW (ref 135–145)
Sodium: 128 mmol/L — ABNORMAL LOW (ref 135–145)
TCO2: 27 mmol/L (ref 22–32)
TCO2: 26 mmol/L (ref 22–32)
TCO2: 23 mmol/L (ref 22–32)
TCO2: 21 mmol/L — ABNORMAL LOW (ref 22–32)
pCO2 arterial: 56.4 mmHg — ABNORMAL HIGH (ref 32.0–48.0)
pCO2 arterial: 52.8 mmHg — ABNORMAL HIGH (ref 32.0–48.0)
pCO2 arterial: 42.9 mmHg (ref 32.0–48.0)
pCO2 arterial: 37.5 mmHg (ref 32.0–48.0)
pH, Arterial: 7.26 — ABNORMAL LOW (ref 7.350–7.450)
pH, Arterial: 7.277 — ABNORMAL LOW (ref 7.350–7.450)
pH, Arterial: 7.32 — ABNORMAL LOW (ref 7.350–7.450)
pH, Arterial: 7.334 — ABNORMAL LOW (ref 7.350–7.450)
pO2, Arterial: 191 mmHg — ABNORMAL HIGH (ref 83.0–108.0)
pO2, Arterial: 100 mmHg (ref 83.0–108.0)
pO2, Arterial: 71 mmHg — ABNORMAL LOW (ref 83.0–108.0)
pO2, Arterial: 77 mmHg — ABNORMAL LOW (ref 83.0–108.0)

## 2020-02-24 LAB — CBC WITH DIFFERENTIAL/PLATELET
Abs Immature Granulocytes: 0.14 10*3/uL — ABNORMAL HIGH (ref 0.00–0.07)
Basophils Absolute: 0 10*3/uL (ref 0.0–0.1)
Basophils Relative: 0 %
Eosinophils Absolute: 0.2 10*3/uL (ref 0.0–0.5)
Eosinophils Relative: 1 %
HCT: 25.3 % — ABNORMAL LOW (ref 39.0–52.0)
Hemoglobin: 7.7 g/dL — ABNORMAL LOW (ref 13.0–17.0)
Immature Granulocytes: 1 %
Lymphocytes Relative: 5 %
Lymphs Abs: 1.1 10*3/uL (ref 0.7–4.0)
MCH: 26.6 pg (ref 26.0–34.0)
MCHC: 30.4 g/dL (ref 30.0–36.0)
MCV: 87.5 fL (ref 80.0–100.0)
Monocytes Absolute: 0.8 10*3/uL (ref 0.1–1.0)
Monocytes Relative: 4 %
Neutro Abs: 18.1 10*3/uL — ABNORMAL HIGH (ref 1.7–7.7)
Neutrophils Relative %: 89 %
Platelets: 386 10*3/uL (ref 150–400)
RBC: 2.89 MIL/uL — ABNORMAL LOW (ref 4.22–5.81)
RDW: 17.2 % — ABNORMAL HIGH (ref 11.5–15.5)
WBC: 20.3 10*3/uL — ABNORMAL HIGH (ref 4.0–10.5)
nRBC: 0 % (ref 0.0–0.2)

## 2020-02-24 LAB — COMPREHENSIVE METABOLIC PANEL
ALT: 102 U/L — ABNORMAL HIGH (ref 0–44)
AST: 87 U/L — ABNORMAL HIGH (ref 15–41)
Albumin: 1.9 g/dL — ABNORMAL LOW (ref 3.5–5.0)
Alkaline Phosphatase: 239 U/L — ABNORMAL HIGH (ref 38–126)
Anion gap: 10 (ref 5–15)
BUN: 110 mg/dL — ABNORMAL HIGH (ref 8–23)
CO2: 23 mmol/L (ref 22–32)
Calcium: 9.2 mg/dL (ref 8.9–10.3)
Chloride: 116 mmol/L — ABNORMAL HIGH (ref 98–111)
Creatinine, Ser: 1.53 mg/dL — ABNORMAL HIGH (ref 0.61–1.24)
GFR calc Af Amer: 50 mL/min — ABNORMAL LOW (ref >60)
GFR calc non Af Amer: 43 mL/min — ABNORMAL LOW (ref >60)
Glucose, Bld: 206 mg/dL — ABNORMAL HIGH (ref 70–99)
Potassium: 4.3 mmol/L (ref 3.5–5.1)
Sodium: 149 mmol/L — ABNORMAL HIGH (ref 135–145)
Total Bilirubin: 0.5 mg/dL (ref 0.3–1.2)
Total Protein: 8.6 g/dL — ABNORMAL HIGH (ref 6.5–8.1)

## 2020-02-24 LAB — CULTURE, BLOOD (ROUTINE X 2)
Culture: NO GROWTH
Culture: NO GROWTH
Special Requests: ADEQUATE
Special Requests: ADEQUATE

## 2020-02-24 LAB — BASIC METABOLIC PANEL
Anion gap: 7 (ref 5–15)
BUN: 39 mg/dL — ABNORMAL HIGH (ref 8–23)
CO2: 22 mmol/L (ref 22–32)
Calcium: 7.2 mg/dL — ABNORMAL LOW (ref 8.9–10.3)
Chloride: 100 mmol/L (ref 98–111)
Creatinine, Ser: 1.06 mg/dL (ref 0.61–1.24)
GFR calc Af Amer: >60 mL/min (ref >60)
GFR calc non Af Amer: >60 mL/min (ref >60)
Glucose, Bld: 391 mg/dL — ABNORMAL HIGH (ref 70–99)
Potassium: 4.8 mmol/L (ref 3.5–5.1)
Sodium: 129 mmol/L — ABNORMAL LOW (ref 135–145)

## 2020-02-24 LAB — HYPERSENSITIVITY PNEUMONITIS
A. Pullulans Abs: NEGATIVE
A.Fumigatus #1 Abs: POSITIVE — AB
Micropolyspora faeni, IgG: NEGATIVE
Pigeon Serum Abs: NEGATIVE
Thermoact. Saccharii: NEGATIVE
Thermoactinomyces vulgaris, IgG: NEGATIVE

## 2020-02-24 LAB — CBC
HCT: 25.8 % — ABNORMAL LOW (ref 39.0–52.0)
Hemoglobin: 7.9 g/dL — ABNORMAL LOW (ref 13.0–17.0)
MCH: 28.1 pg (ref 26.0–34.0)
MCHC: 30.6 g/dL (ref 30.0–36.0)
MCV: 91.8 fL (ref 80.0–100.0)
Platelets: 105 10*3/uL — ABNORMAL LOW (ref 150–400)
RBC: 2.81 MIL/uL — ABNORMAL LOW (ref 4.22–5.81)
RDW: 16.3 % — ABNORMAL HIGH (ref 11.5–15.5)
WBC: 34.7 10*3/uL — ABNORMAL HIGH (ref 4.0–10.5)
nRBC: 0.1 % (ref 0.0–0.2)

## 2020-02-24 LAB — APTT
aPTT: 38 seconds — ABNORMAL HIGH (ref 24–36)
aPTT: 136 seconds — ABNORMAL HIGH (ref 24–36)

## 2020-02-24 LAB — HEPARIN LEVEL (UNFRACTIONATED)
Heparin Unfractionated: <0.1 IU/mL — ABNORMAL LOW (ref 0.30–0.70)
Heparin Unfractionated: 0.82 IU/mL — ABNORMAL HIGH (ref 0.30–0.70)
Heparin Unfractionated: 0.54 IU/mL (ref 0.30–0.70)

## 2020-02-24 LAB — GLUCOSE, CAPILLARY
Glucose-Capillary: 278 mg/dL — ABNORMAL HIGH (ref 70–99)
Glucose-Capillary: 335 mg/dL — ABNORMAL HIGH (ref 70–99)
Glucose-Capillary: 364 mg/dL — ABNORMAL HIGH (ref 70–99)
Glucose-Capillary: 289 mg/dL — ABNORMAL HIGH (ref 70–99)
Glucose-Capillary: 253 mg/dL — ABNORMAL HIGH (ref 70–99)
Glucose-Capillary: 330 mg/dL — ABNORMAL HIGH (ref 70–99)

## 2020-02-24 LAB — MTB RIF NAA NON-SPUTUM, W/O CULTURE

## 2020-02-24 LAB — GLOMERULAR BASEMENT MEMBRANE ANTIBODIES: GBM Ab: 7 units (ref 0–20)

## 2020-02-24 MED ORDER — SULFAMETHOXAZOLE-TRIMETHOPRIM 400-80 MG/5ML IV SOLN
15.0000 mg/kg/d | Freq: Two times a day (BID) | INTRAVENOUS | Status: DC
  Filled 2020-02-24: qty 24.52

## 2020-02-24 MED ORDER — FUROSEMIDE 10 MG/ML IJ SOLN
40.0000 mg | Freq: Once | INTRAMUSCULAR | Status: AC
  Administered 2020-02-24: 40 mg via INTRAVENOUS
  Filled 2020-02-24: qty 4

## 2020-02-24 MED ORDER — INSULIN ASPART 100 UNIT/ML ~~LOC~~ SOLN
0.0000 [IU] | SUBCUTANEOUS | Status: DC
  Administered 2020-02-24: 8 [IU] via SUBCUTANEOUS
  Administered 2020-02-24: 11 [IU] via SUBCUTANEOUS
  Administered 2020-02-24: 8 [IU] via SUBCUTANEOUS
  Administered 2020-02-25: 5 [IU] via SUBCUTANEOUS
  Administered 2020-02-25: 8 [IU] via SUBCUTANEOUS
  Administered 2020-02-25: 11 [IU] via SUBCUTANEOUS
  Administered 2020-02-25: 8 [IU] via SUBCUTANEOUS
  Administered 2020-02-25: 5 [IU] via SUBCUTANEOUS
  Administered 2020-02-25: 11 [IU] via SUBCUTANEOUS
  Administered 2020-02-26 (×3): 3 [IU] via SUBCUTANEOUS
  Administered 2020-02-26 – 2020-02-27 (×2): 5 [IU] via SUBCUTANEOUS
  Administered 2020-02-27 (×2): 3 [IU] via SUBCUTANEOUS
  Administered 2020-02-27: 2 [IU] via SUBCUTANEOUS
  Administered 2020-02-27: 5 [IU] via SUBCUTANEOUS
  Administered 2020-02-28: 2 [IU] via SUBCUTANEOUS
  Administered 2020-02-28 (×2): 3 [IU] via SUBCUTANEOUS
  Administered 2020-02-28: 2 [IU] via SUBCUTANEOUS
  Administered 2020-02-28 – 2020-02-29 (×2): 3 [IU] via SUBCUTANEOUS
  Administered 2020-02-29: 2 [IU] via SUBCUTANEOUS
  Administered 2020-02-29: 5 [IU] via SUBCUTANEOUS
  Administered 2020-02-29 – 2020-03-01 (×3): 3 [IU] via SUBCUTANEOUS
  Administered 2020-03-01: 2 [IU] via SUBCUTANEOUS
  Administered 2020-03-01: 5 [IU] via SUBCUTANEOUS
  Administered 2020-03-01 (×2): 3 [IU] via SUBCUTANEOUS
  Administered 2020-03-02: 2 [IU] via SUBCUTANEOUS
  Administered 2020-03-02 (×2): 5 [IU] via SUBCUTANEOUS
  Administered 2020-03-02: 2 [IU] via SUBCUTANEOUS
  Administered 2020-03-02: 5 [IU] via SUBCUTANEOUS
  Administered 2020-03-03: 2 [IU] via SUBCUTANEOUS
  Administered 2020-03-03 – 2020-03-05 (×8): 3 [IU] via SUBCUTANEOUS
  Administered 2020-03-05: 2 [IU] via SUBCUTANEOUS
  Administered 2020-03-05: 3 [IU] via SUBCUTANEOUS
  Administered 2020-03-05 – 2020-03-06 (×2): 2 [IU] via SUBCUTANEOUS
  Administered 2020-03-06: 5 [IU] via SUBCUTANEOUS
  Administered 2020-03-07 (×2): 3 [IU] via SUBCUTANEOUS
  Administered 2020-03-07: 5 [IU] via SUBCUTANEOUS
  Administered 2020-03-07: 2 [IU] via SUBCUTANEOUS
  Administered 2020-03-07 (×2): 3 [IU] via SUBCUTANEOUS
  Administered 2020-03-08 (×3): 2 [IU] via SUBCUTANEOUS
  Administered 2020-03-08: 3 [IU] via SUBCUTANEOUS
  Administered 2020-03-08: 2 [IU] via SUBCUTANEOUS
  Administered 2020-03-08: 16:00:00 3 [IU] via SUBCUTANEOUS
  Administered 2020-03-09 – 2020-03-10 (×4): 2 [IU] via SUBCUTANEOUS
  Administered 2020-03-10 (×2): 3 [IU] via SUBCUTANEOUS
  Administered 2020-03-11: 2 [IU] via SUBCUTANEOUS
  Administered 2020-03-11: 3 [IU] via SUBCUTANEOUS
  Administered 2020-03-11 – 2020-03-14 (×3): 2 [IU] via SUBCUTANEOUS
  Administered 2020-03-14 (×2): 3 [IU] via SUBCUTANEOUS
  Administered 2020-03-14: 2 [IU] via SUBCUTANEOUS
  Administered 2020-03-14 – 2020-03-15 (×5): 3 [IU] via SUBCUTANEOUS

## 2020-02-24 MED ORDER — SULFAMETHOXAZOLE-TRIMETHOPRIM 400-80 MG/5ML IV SOLN
5.0000 mg/kg | Freq: Two times a day (BID) | INTRAVENOUS | Status: DC
  Administered 2020-02-24 – 2020-02-25 (×2): 261.44 mg via INTRAVENOUS
  Filled 2020-02-24 (×4): qty 16.34

## 2020-02-24 NOTE — Progress Notes (Signed)
ANTICOAGULATION CONSULT NOTE   Pharmacy Consult for Heparin Indication: 01/27/20 small subacute PE  No Known Allergies  Patient Measurements: Height: 5\' 2"  (157.5 cm) Weight: 120 lb 2.4 oz (54.5 kg) IBW/kg (Calculated) : 54.6 Heparin Dosing Weight: 52kg  Vital Signs: Temp: 98.6 F (37 C) (03/02 1600) Temp Source: Oral (03/02 1600) BP: 115/60 (03/02 1800) Pulse Rate: 91 (03/02 1800)  Labs: Recent Labs    02/23/20 0420 02/23/20 0420 02/23/20 1240 02/23/20 1441 02/24/20 0301 02/24/20 0301 02/24/20 0556 02/24/20 0556 02/24/20 0657 02/24/20 1220 02/24/20 1755  HGB 8.5*  --   --    < > 7.7*   < > 8.5*   < > 7.9* 11.2*  --   HCT 27.0*  --   --    < > 25.3*   < > 25.0*  --  25.8* 33.0*  --   PLT 156  --   --   --  386  --   --   --  105*  --   --   APTT 79*   < > 82*  --  38*  --   --   --  136*  --   --   HEPARINUNFRC 0.64   < > 0.63   < > <0.10*  --   --   --  0.82*  --  0.54  CREATININE 0.85  --   --   --  1.53*  --   --   --  1.06  --   --   TROPONINIHS 145*  --   --   --   --   --   --   --   --   --   --    < > = values in this interval not displayed.    Estimated Creatinine Clearance: 44.3 mL/min (by C-G formula based on SCr of 1.06 mg/dL).   Assessment: 79 y.o. male with 01/27/20 small subacute PE, PTA Eliquis on hold, last dose 2/21. Started on heparin with low goal for H/H drop requiring transfusion on admit, stable H/H and platelets x5 days. Switched back to Apixaban 2/26 >> 2/27, back to Heparin IV on 2/28 due to intubation.   Heparin level this evening is slightly SUPRAtherapeutic of the lower goal rate after a rate reduction earlier today (HL 0.54 << 0.82, goal of 0.3-0.5). Would expect the level to trend down a little further so will not adjust the rate this evening - will check another level in 8 hours and adjust from there. No bleeding or issues reported per discussion with the RN.   Goal of Therapy:  Monitor platelets by anticoagulation protocol: Yes  HL  0.3-0.5   Plan:  - Continue Heparin at 1000 units/hr (10 ml/hr) - Will continue to monitor for any signs/symptoms of bleeding and will follow up with heparin level in 8 hours   Thank you for allowing pharmacy to be a part of this patient's care.  3/28, PharmD, BCPS Clinical Pharmacist Clinical phone for 02/24/2020: 04/25/2020 02/24/2020 6:44 PM   **Pharmacist phone directory can now be found on [email or web address] ([email or web address]).  Listed under [facility].

## 2020-02-24 NOTE — Progress Notes (Signed)
Pharmacy Antibiotic Note  Bruce Webb is a 79 y.o. male admitted on 02/15/2020 with cavitary pneumonia. Patient had recent covid infection with steroid use  ID following with concern for PCP, Mycobacterial disease, or fungal infection  Patient likely needs bronch but needs family decision on intubation first  SCr bumped up to 1.53 with AM lab, then somewhat improved to 1.06 upon repeat lab; however, urine output low, will lower Bactrim to q12h dosing and trend renal fxn. K wnl on higher end.  Plan: Continue cefepime 2 g IV q12h Reduce Bactrim to 5 mg/kg IV q12h F/u cx, bal, ID recs Watch renal fx, K while on bactrim  Height: _0  (157.5 cm) Weight: 120 lb 2.4 oz (54.5 kg) IBW/kg (Calculated) : 54.6  Temp (24hrs), Avg:98.1 F (36.7 C), Min:97.5 F (36.4 C), Max:99.3 F (37.4 C)  Recent Labs  Lab  0000 02/19/20 1202 02/19/20 1635 02/20/20 0509 02/20/20 1045 02/21/20 0517 02/22/20 0352 02/23/20 0420 02/24/20 0301 02/24/20 0657  WBC  --   --   --    < >  --  28.1* 37.8* 46.3* 20.3* 34.7*  CREATININE   < >  --   --   --  1.10  --  0.79 0.85 1.53* 1.06  LATICACIDVEN  --  2.4* 1.9  --   --   --   --  1.5  --   --    < > = values in this interval not displayed.    Estimated Creatinine Clearance: 44.3 mL/min (by C-G formula based on SCr of 1.06 mg/dL).    No Known Allergies  Berenice Bouton, PharmD PGY1 Pharmacy Resident  Please check AMION for all [place] phone numbers After 10:00 PM, call [place] [phone]   02/24/2020 10:41 AM

## 2020-02-24 NOTE — Progress Notes (Signed)
eLink Physician-Brief Progress Note Patient Name: Bruce Webb DOB: 11-29-1941 MRN: [id number]   Date of Service  02/24/2020  HPI/Events of Note  Request to renew restraint orders.   eICU Interventions  Will order: 1. Will reorder soft bilateral wrist restraints X 8 hours.     Intervention Category Major Interventions: Other:  Krishauna Schatzman Dennard Nip 02/24/2020, 1:52 AM

## 2020-02-24 NOTE — Progress Notes (Signed)
NAME:  Bruce Webb, MRN:  [id number], DOB:  [birth year], LOS: 9 ADMISSION DATE:  02/15/2020, CONSULTATION DATE:  02/19/2020 REFERRING MD:  Dr. Karleen Hampshire, CHIEF COMPLAINT:  Hypotension/ hypoxia    Brief History   79 year old male post COVID and PE in January on Eliquis admitted 2/21 to [facility] for extertional dyspnea found to be anemic with hemoccult positive stools.  GI consulted and transfused PRBC with since stabilized Hgb but has had persistent higher O2 requirements.  Was diuresed but today developed hypotension, with ongoing higher O2 requirements, PCCM was asked to consult.     History of present illness   HPI obtained via interpreter and from daughter.   79 year old male, who speaks [place], with history of HTN, HFpEF, DMT2, glaucoma with recent COVID 01/16/2020 with subsequent subacute PE on Eliquis and home O2 2L [place] who presented 2/21 with exertional dyspnea, black tarry stools, and worsening vision loss in his right eye.  Patient lives at home with his daughter and pre-COVID was independent in his ADLs.  He is a retired and prior worked in an office.  Denies any smoking or pulmonary history.  Reports prior to COVID, had a chronic non-productive cough for 10-15 years and his primary care MD treated it occasionally with cough medicine.   Patient with recent hospitalization at [facility] for [place] after initial positive 1/22 for failed outpatient treatment for COVID infection despite monoclonal antibodies and steroids.  While at [facility], treated with remdesivir and decadron and found to have a PE and was discharged home on 2/6 on 2L O2 and Eliquis.   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    On admit, found to have worsening anemia, 8.8-> 6 with positive fecal occult blood testing and hypotension responsive to fluid.  Additionally found to have WBC 28k, CXR with worsening multifocal airspace consolidation bilaterally.  He was started  empirically on zithromax and cefepime. PCT 0.5.  He was transfused with 2 units PRBC, started on protonix, and GI consulted. However, given tenuous respiratory status and increased O2 needs, GI deferred EGD.  Hgb trend since stable and therefore placed on heparin gtt on 2/23.  He was diuresed on 2/23 given fluid overload post blood transfusion with good output then but has since required midodrine to help with soft blood pressures.  On 2/25, he had worsening hypotension despite midodrine, ongoing elevated O2 needs, and transaminitis, PCCM asked to consult.     Past Medical History  HTN, HFpEF, DMT2, glaucoma with progressive vision loss (left, now rapid loss in right) - recent COVID 01/16/2020 with subsequent PE on Eliquis and O2 2L [place]  Significant Hospital Events   2/21 admit. Stool ob =negatoive  2/23- echo ef 65%  2/25 PCCM consulted for hypotension and persistent hypoxemia  2/26 - Has severe desaturations with activity and eating. Required 10L while eating his meal today. No acute distress but continues to have shortness of breath. Denies chest pain.   2/27 -  -remains on 10 L.  He easily desaturates.  [name] at the bedside.  I spoke to her and subsequently also to their family friend who is in physician assistant in cardiology [place]).  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  According to the daughter he got worse with COVID-19 then got better went home but he declined again in terms of his hypoxemia.  I personally visualized the current  CT scan of the chest compared to 4 weeks ago and to my visualization it looks like an extension of his pulmonary infiltrates from the original COVID-19 admission including a right right upper lobe anterior segment cavity thick-walled.   Consults:  GI  PCCM  Procedures:   Significant Diagnostic Tests:  2/21 CTH >> 1. No acute intracranial abnormalities. 2. Mild cerebral atrophy with chronic  microvascular ischemic changes in the cerebral white matter, as above.  CTA 01/27/20 - Diffuse subpleural/peripheral interstitial ground glass opacities with increased involvement in the the lower lobes bilaterally R>L. CXR 02/16/20 R>L multifocal pneumonia R>L, no edema or effusion  2/23 TTE >> 1. Left ventricular ejection fraction, by estimation, is 60 to 65%. The left ventricle has normal function. The left ventricle has no regional wall motion abnormalities. Left ventricular diastolic parameters are consistent with Grade II diastolic dysfunction (pseudonormalization).  2. The history indicates the patient has a pulmonary embolus. The RV is  not dilated and there is normal RV function. . Right ventricular systolic function is low normal. The right ventricular size is normal.  3. The mitral valve is normal in structure and function. Mild mitral valve regurgitation. No evidence of mitral stenosis.  4. The aortic valve is normal in structure and function. Aortic valve regurgitation is not visualized. No aortic stenosis is present.   Micro Data:  2/21 BCx 2 >> ngtd x 4 days 2/25 - CCP 2/27 - quant gold -  2/27 RVP - negative 2/28  - urine strep - negative 228 - urine leg -  2/28 - aspergillus Ab -  xxxxxxxxxxxx 3/1 BAL Cx, fungal, acid fast >> 3/1 BAL PCP>>  Antimicrobials:  2/21 azithro>>2/26 2/21 ceftriaxone  2/22 cefepime >> 2/28 Bactrim >  Interim history/subjective:  Improved oxygenation after increasing sedation for double triggering and vent dyssynchrony.   Objective   Blood pressure 121/65, pulse 81, temperature 98 F (36.7 C), temperature source Oral, resp. rate (!) 30, height _0  (1.575 m), weight 54.5 kg, SpO2 100 %.    Vent Mode: PCV FiO2 (%):  [80 %-100 %] 80 % Set Rate:  [20 bmp-30 bmp] 30 bmp Vt Set:  [440 mL] 440 mL PEEP:  [5 cmH20-10 cmH20] 10 cmH20 Plateau Pressure:  [20 cmH20-36 cmH20] 36 cmH20   Intake/Output Summary (Last 24 hours) at 02/24/2020  1044 Last data filed at 02/24/2020 0900 Gross per 24 hour  Intake 4669.76 ml  Output 25 ml  Net 4644.76 ml   Filed Weights   02/20/20 0411 02/21/20 0421 02/24/20 0500  Weight: 50.3 kg 52.3 kg 54.5 kg   Physical Exam: General: Chronically ill-appearing, frail male laying in bed HENT: [place], AT, ETT in place Eyes: EOMI, no scleral icterus Respiratory: Vented breath sounds No crackles, wheezing or rales Cardiovascular: RRR, -M/R/G, no JVD GI: BS+, soft, nontender Extremities:-Edema,-tenderness Neuro: Sedated GU: Foley in place  Resolved Hospital Problem list    Assessment & Plan:  79 year old male never smoker with prior COVID-19 infection diagnosed in January and requiring hospitalization from 2/1-2/6, recently diagnosed pulmonary emboli on anticoagulation and end-stage glaucoma with right vision loss in addition to his known left vision loss who presents with worsening shortness of breath and admitted for acute on chronic respiratory failure secondary to multifocal pneumonia. Has had required high O2 requirements during the course of this hospitalization despite steroid therapy and transferred to the ICU on 2/28.  ARDS/Acute hypoxemic respiratory failure secondary to cavitary pneumonia. Likely multifactorial with bacterial/necrotizing pneumonia in setting  of post-inflammatory COVID-19 infection vs pulmonary fibrosis, atelectasis, pulmonary emboli. Low suspicion for TB even though he is from an endemic area; PPD neg and immigrated >10 years. BAL cell count significant for neutrophilic predominance suggesting bacterial infection. Autoimmune work-up, Quantiferon gold and aspergillus ab pending. --Full vent support --Wean FIO2/PEEP for goal SpO2 88-95% or pO2 55-80 --ABG PRN --Appreciate ID input. On Bactrim for possible PJP. Continue Cefepime. --F/u cultures including BAL --F/u urine legionella, PCP --Continue BID solumedrol --PAD protocol: Wean fentanyl and propofol for goal RASS  -1 --Lasix 40 mg OTO  Septic shock secondary to bacterial pneumonia. LA wnl --Continue antibiotics per ID --Wean levo gtt for MAP goal >65 --F/u cultures  Right pulmonary emboli --Continue heparin gtt --Trend CBC with hx of melena prior to admission  Acute blood loss anemia, melena. S/p 2U PRBC and Hg stable --Trend CBC --GI consulted but unable to perform EGD due to respiratory status --PPID BID  Hyperglycemia --SSI  Transaminitis -worsening likely in setting of shock. RUQ unrevealing --Trend LFTs  Chronic diastolic heart failure --Lasix OTO as noted above  Acute urinary retention 2/27 --Foley in place  GOC --Guarded prognosis in setting of respiratory failure in patient with baseline lung function since his COVID infection. Appreciate palliative consult.  Best practice:  Diet: TF Pain/Anxiety/Delirium protocol (if indicated): yes VAP protocol (if indicated): yes DVT prophylaxis: heparin gtt (stop eliquis) GI prophylaxis: PPI bid dosing Glucose control: SSI Mobility: push  Code Status: Full  Family Communication:d.w Rowe Pavy the PA in cards who his husband ofpatient's distance niece and is family spokesperson due to medical knowledge. Will update family today.  Disposition: ICU  SIGNATURE   The patient is critically ill with multiple organ systems failure and requires high complexity decision making for assessment and support, frequent evaluation and titration of therapies, application of advanced monitoring technologies and extensive interpretation of multiple databases.   Critical Care Time devoted to patient care services described in this note is 45 Minutes. This time reflects time of care of this signee Dr. Rodman Pickle.   Rodman Pickle, M.D. [facility] Pulmonary/Critical Care Medicine 02/24/2020 11:01 AM   Please see Amion for pager number to reach on-call Pulmonary and Critical Care Team.  LABS    PULMONARY Recent Labs  Lab 02/23/20 1441  02/23/20 2121 02/23/20 2338 02/24/20 0253 02/24/20 0556  PHART 7.176* 7.268* 7.273* 7.260* 7.277*  PCO2ART 71.1* 53.1* 54.8* 56.4* 52.8*  PO2ART 94.0 54.0* 165.0* 191.0* 100.0  HCO3 26.3 24.3 25.5 25.4 24.8  TCO2 _0 O2SAT 94.0 82.0 99.0 99.0 97.0    CBC Recent Labs  Lab 02/23/20 0420 02/23/20 1441 02/24/20 0301 02/24/20 0556 02/24/20 0657  HGB 8.5*   < > 7.7* 8.5* 7.9*  HCT 27.0*   < > 25.3* 25.0* 25.8*  WBC 46.3*  --  20.3*  --  34.7*  PLT 156  --  386  --  105*   < > = values in this interval not displayed.    COAGULATION Recent Labs  Lab 02/19/20 1635  INR 1.2    CARDIAC  No results for input(s): TROPONINI in the last 168 hours. No results for input(s): PROBNP in the last 168 hours.   CHEMISTRY Recent Labs  Lab 02/18/20 0505 02/19/20 0315 02/20/20 1045 02/20/20 1045 02/22/20 0352 02/22/20 1830 02/23/20 0420 02/23/20 1441 02/23/20 2338 02/23/20 2338 02/24/20 0253 02/24/20 0253 02/24/20 0301 02/24/20 0301 02/24/20 0556 02/24/20 0657  NA 131*   < > 137   < >  134*   < > 137   < > 134*  --  131*  --  149*  --  131* 129*  K 3.0*   < > 4.6   < > 4.8   < > 4.4   < > 4.9   < > 4.6   < > 4.3   < > 4.8 4.8  CL 98   < > 103  --  102  --  103  --   --   --   --   --  116*  --   --  100  CO2 25   < > 26  --  26  --  27  --   --   --   --   --  23  --   --  22  GLUCOSE 154*   < > 180*  --  161*  --  107*  --   --   --   --   --  206*  --   --  391*  BUN 26*   < > 30*  --  44*  --  37*  --   --   --   --   --  110*  --   --  39*  CREATININE 1.00   < > 1.10  --  0.79  --  0.85  --   --   --   --   --  1.53*  --   --  1.06  CALCIUM 7.3*   < > 8.1*  --  8.5*  --  7.5*  --   --   --   --   --  9.2  --   --  7.2*  MG 1.7  --  2.0  --   --   --   --   --   --   --   --   --   --   --   --   --    < > = values in this interval not displayed.   Estimated Creatinine Clearance: 44.3 mL/min (by C-G formula based on SCr of 1.06 mg/dL).   LIVER Recent Labs   Lab 02/19/20 0315 02/19/20 1635 02/21/20 1205 02/22/20 0352 02/23/20 0420 02/24/20 0301  AST 189*  --  77* 98* 35 87*  ALT 169*  --  116* 124* 78* 102*  ALKPHOS 147*  --  167* 171* 131* 239*  BILITOT 0.8  --  0.3 0.5 0.5 0.5  PROT 5.1*  --  6.4* 5.6* 5.0* 8.6*  ALBUMIN 1.3*  --  1.3* 1.5* 1.4* 1.9*  INR  --  1.2  --   --   --   --      INFECTIOUS Recent Labs  Lab 02/19/20 1202 02/19/20 1635 02/23/20 0420  LATICACIDVEN 2.4* 1.9 1.5  PROCALCITON  --  0.99  --      ENDOCRINE CBG (last 3)  Recent Labs    02/23/20 1456 02/23/20 2046 02/23/20 2348  GLUCAP 179* 183* 201*         IMAGING x48h  - image(s) personally visualized  -   highlighted in bold Portable Chest x-ray  Result Date: 02/22/2020 CLINICAL DATA:  Cavitary pneumonia EXAM: PORTABLE CHEST 1 VIEW COMPARISON:  02/22/2020 FINDINGS: There are persistent diffuse bilateral airspace opacities, grossly similar to prior study. The endotracheal tube terminates approximately 5.2 cm above the carina. The enteric tube extends below the left hemidiaphragm. The heart size is stable.  There is no pneumothorax. No large pleural effusion. No acute osseous abnormality. IMPRESSION: Persistent diffuse bilateral airspace opacities, grossly similar to prior study. Endotracheal tube as above. Electronically Signed   By: Constance Holster M.D.   On: 02/22/2020 17:44   DG CHEST PORT 1 VIEW  Result Date: 02/22/2020 CLINICAL DATA:  Acute respiratory failure, hypoxia EXAM: PORTABLE CHEST 1 VIEW COMPARISON:  02/16/2020 FINDINGS: Patchy bilateral interstitial and alveolar airspace opacities involving the upper and lower lobes. Trace left pleural effusion. No significant right pleural effusion. No pneumothorax. Stable cardiomediastinal silhouette. No aggressive osseous lesion. IMPRESSION: Persistent patchy bilateral interstitial and alveolar airspace opacities most concerning for multilobar pneumonia including atypical viral pneumonia.  Overall there is no significant interval change accounting for differences in technique. Electronically Signed   By: Kathreen Devoid   On: 02/22/2020 13:43   [place] EKG SITE RITE  Result Date: 02/22/2020 If [facility] image not attached, placement could not be confirmed due to current cardiac rhythm.

## 2020-02-24 NOTE — Progress Notes (Signed)
Labs results from 3/2 @ 0301 do not correlate with previous results. Will redraw for accuracy.

## 2020-02-24 NOTE — Progress Notes (Signed)
ANTICOAGULATION CONSULT NOTE   Pharmacy Consult for Heparin Indication: 01/27/20 small subacute PE  No Known Allergies  Patient Measurements: Height: 5\' 2"  (157.5 cm) Weight: 120 lb 2.4 oz (54.5 kg) IBW/kg (Calculated) : 54.6 Heparin Dosing Weight: 52kg  Vital Signs: Temp: 98 F (36.7 C) (03/02 0800) Temp Source: Oral (03/02 0800) BP: 121/65 (03/02 0900) Pulse Rate: 81 (03/02 0900)  Labs: Recent Labs    02/23/20 0420 02/23/20 0420 02/23/20 1240 02/23/20 1441 02/24/20 0301 02/24/20 0301 02/24/20 0556 02/24/20 0657  HGB 8.5*  --   --    < > 7.7*   < > 8.5* 7.9*  HCT 27.0*  --   --    < > 25.3*  --  25.0* 25.8*  PLT 156  --   --   --  386  --   --  105*  APTT 79*   < > 82*  --  38*  --   --  136*  HEPARINUNFRC 0.64   < > 0.63  --  <0.10*  --   --  0.82*  CREATININE 0.85  --   --   --  1.53*  --   --  1.06  TROPONINIHS 145*  --   --   --   --   --   --   --    < > = values in this interval not displayed.    Estimated Creatinine Clearance: 44.3 mL/min (by C-G formula based on SCr of 1.06 mg/dL).   Assessment: 79 y.o. male with 01/27/20 small subacute PE, PTA Eliquis on hold, last dose 2/21. Started on heparin with low goal for H/H drop requiring transfusion on admit, stable H/H and platelets x5 days. Switched back to Apixaban 2/26 >> 2/27, back to Heparin IV on 2/28 due to intubation.   Hep lvl and aptt now correlating and both high  Goal of Therapy:  Monitor platelets by anticoagulation protocol: Yes   Plan:  Reduce heparin to 1000 units/hr Next lvl 1800 Daily hep lvl cbc  3/28, PharmD, BCPS, BCCCP Clinical Pharmacist [phone]  Please check AMION for all [facility] Pharmacy numbers  02/24/2020 10:37 AM

## 2020-02-24 NOTE — Progress Notes (Signed)
Palliative:   Bruce Webb is lying quietly in bed, intubated/ventilated and sedated.  There is no family at bedside at this time.   Bruce Webb son request that we share medical information with Bruce Webb nephew.  Call to patient's "distant nephew", Bruce Webb at [phone], Cardiology PA with [facility].  We talk about ventilator support, vasopressor use.  We also talk about some "wat if's and maybe's".  We talk about more time for outcomes, per family wish.  We also briefly touch on trach on day 14, what this would look like.   Conference with attending and bedside nursing staff related to patient condition, needs, GOC.  PMT to follow   Plan:   Time for outcomes. Continue FULL code/scope.   35 minutes  Lillia Carmel, NP Palliative Medicine Team Team Phone # [phone] Greater than 50% of this time was spent counseling and coordinating care related to the above assessment and plan.

## 2020-02-25 ENCOUNTER — Inpatient Hospital Stay (HOSPITAL_COMMUNITY): Payer: Medicare Other

## 2020-02-25 DIAGNOSIS — I2782 Chronic pulmonary embolism: Secondary | ICD-10-CM

## 2020-02-25 LAB — COMPREHENSIVE METABOLIC PANEL
ALT: 40 U/L (ref 0–44)
AST: 22 U/L (ref 15–41)
Albumin: 1.3 g/dL — ABNORMAL LOW (ref 3.5–5.0)
Alkaline Phosphatase: 138 U/L — ABNORMAL HIGH (ref 38–126)
Anion gap: 9 (ref 5–15)
BUN: 54 mg/dL — ABNORMAL HIGH (ref 8–23)
CO2: 20 mmol/L — ABNORMAL LOW (ref 22–32)
Calcium: 7.2 mg/dL — ABNORMAL LOW (ref 8.9–10.3)
Chloride: 99 mmol/L (ref 98–111)
Creatinine, Ser: 1.59 mg/dL — ABNORMAL HIGH (ref 0.61–1.24)
GFR calc Af Amer: 47 mL/min — ABNORMAL LOW (ref >60)
GFR calc non Af Amer: 41 mL/min — ABNORMAL LOW (ref >60)
Glucose, Bld: 224 mg/dL — ABNORMAL HIGH (ref 70–99)
Potassium: 4.3 mmol/L (ref 3.5–5.1)
Sodium: 128 mmol/L — ABNORMAL LOW (ref 135–145)
Total Bilirubin: 0.4 mg/dL (ref 0.3–1.2)
Total Protein: 5 g/dL — ABNORMAL LOW (ref 6.5–8.1)

## 2020-02-25 LAB — ASPERGILLUS ANTIBODY BY IMMUNODIFF
Aspergillus flavus: NEGATIVE
Aspergillus fumigatus, IgG: NEGATIVE
Aspergillus niger: NEGATIVE

## 2020-02-25 LAB — POCT I-STAT 7, (LYTES, BLD GAS, ICA,H+H)
Acid-base deficit: 4 mmol/L — ABNORMAL HIGH (ref 0.0–2.0)
Acid-base deficit: 5 mmol/L — ABNORMAL HIGH (ref 0.0–2.0)
Acid-base deficit: 5 mmol/L — ABNORMAL HIGH (ref 0.0–2.0)
Bicarbonate: 21.5 mmol/L (ref 20.0–28.0)
Bicarbonate: 20.4 mmol/L (ref 20.0–28.0)
Bicarbonate: 19.1 mmol/L — ABNORMAL LOW (ref 20.0–28.0)
Calcium, Ion: 1.12 mmol/L — ABNORMAL LOW (ref 1.15–1.40)
Calcium, Ion: 1.1 mmol/L — ABNORMAL LOW (ref 1.15–1.40)
Calcium, Ion: 1.09 mmol/L — ABNORMAL LOW (ref 1.15–1.40)
HCT: 23 % — ABNORMAL LOW (ref 39.0–52.0)
HCT: 22 % — ABNORMAL LOW (ref 39.0–52.0)
HCT: 25 % — ABNORMAL LOW (ref 39.0–52.0)
Hemoglobin: 7.8 g/dL — ABNORMAL LOW (ref 13.0–17.0)
Hemoglobin: 7.5 g/dL — ABNORMAL LOW (ref 13.0–17.0)
Hemoglobin: 8.5 g/dL — ABNORMAL LOW (ref 13.0–17.0)
O2 Saturation: 96 %
O2 Saturation: 95 %
O2 Saturation: 88 %
Patient temperature: 98.5
Patient temperature: 97.6
Patient temperature: 97.6
Potassium: 4.3 mmol/L (ref 3.5–5.1)
Potassium: 4.7 mmol/L (ref 3.5–5.1)
Potassium: 4.7 mmol/L (ref 3.5–5.1)
Sodium: 130 mmol/L — ABNORMAL LOW (ref 135–145)
Sodium: 127 mmol/L — ABNORMAL LOW (ref 135–145)
Sodium: 128 mmol/L — ABNORMAL LOW (ref 135–145)
TCO2: 23 mmol/L (ref 22–32)
TCO2: 22 mmol/L (ref 22–32)
TCO2: 20 mmol/L — ABNORMAL LOW (ref 22–32)
pCO2 arterial: 41 mmHg (ref 32.0–48.0)
pCO2 arterial: 39.7 mmHg (ref 32.0–48.0)
pCO2 arterial: 32.1 mmHg (ref 32.0–48.0)
pH, Arterial: 7.327 — ABNORMAL LOW (ref 7.350–7.450)
pH, Arterial: 7.317 — ABNORMAL LOW (ref 7.350–7.450)
pH, Arterial: 7.382 (ref 7.350–7.450)
pO2, Arterial: 87 mmHg (ref 83.0–108.0)
pO2, Arterial: 80 mmHg — ABNORMAL LOW (ref 83.0–108.0)
pO2, Arterial: 54 mmHg — ABNORMAL LOW (ref 83.0–108.0)

## 2020-02-25 LAB — CBC WITH DIFFERENTIAL/PLATELET
Abs Immature Granulocytes: 1.98 10*3/uL — ABNORMAL HIGH (ref 0.00–0.07)
Basophils Absolute: 0.1 10*3/uL (ref 0.0–0.1)
Basophils Relative: 0 %
Eosinophils Absolute: 0.1 10*3/uL (ref 0.0–0.5)
Eosinophils Relative: 0 %
HCT: 25.4 % — ABNORMAL LOW (ref 39.0–52.0)
Hemoglobin: 7.9 g/dL — ABNORMAL LOW (ref 13.0–17.0)
Immature Granulocytes: 5 %
Lymphocytes Relative: 4 %
Lymphs Abs: 1.5 10*3/uL (ref 0.7–4.0)
MCH: 27.6 pg (ref 26.0–34.0)
MCHC: 31.1 g/dL (ref 30.0–36.0)
MCV: 88.8 fL (ref 80.0–100.0)
Monocytes Absolute: 1.3 10*3/uL — ABNORMAL HIGH (ref 0.1–1.0)
Monocytes Relative: 3 %
Neutro Abs: 33.5 10*3/uL — ABNORMAL HIGH (ref 1.7–7.7)
Neutrophils Relative %: 88 %
Platelets: 126 10*3/uL — ABNORMAL LOW (ref 150–400)
RBC: 2.86 MIL/uL — ABNORMAL LOW (ref 4.22–5.81)
RDW: 16.3 % — ABNORMAL HIGH (ref 11.5–15.5)
WBC: 38.4 10*3/uL — ABNORMAL HIGH (ref 4.0–10.5)
nRBC: 0.2 % (ref 0.0–0.2)

## 2020-02-25 LAB — APTT: aPTT: 79 seconds — ABNORMAL HIGH (ref 24–36)

## 2020-02-25 LAB — CULTURE, BAL-QUANTITATIVE W GRAM STAIN: Culture: NO GROWTH

## 2020-02-25 LAB — GLUCOSE, CAPILLARY
Glucose-Capillary: 181 mg/dL — ABNORMAL HIGH (ref 70–99)
Glucose-Capillary: 221 mg/dL — ABNORMAL HIGH (ref 70–99)
Glucose-Capillary: 233 mg/dL — ABNORMAL HIGH (ref 70–99)
Glucose-Capillary: 275 mg/dL — ABNORMAL HIGH (ref 70–99)
Glucose-Capillary: 287 mg/dL — ABNORMAL HIGH (ref 70–99)
Glucose-Capillary: 313 mg/dL — ABNORMAL HIGH (ref 70–99)
Glucose-Capillary: 319 mg/dL — ABNORMAL HIGH (ref 70–99)

## 2020-02-25 LAB — QUANTIFERON-TB GOLD PLUS

## 2020-02-25 LAB — ACID FAST SMEAR (AFB, MYCOBACTERIA): Acid Fast Smear: NEGATIVE

## 2020-02-25 LAB — LACTIC ACID, PLASMA
Lactic Acid, Venous: 1.6 mmol/L (ref 0.5–1.9)
Lactic Acid, Venous: 1.8 mmol/L (ref 0.5–1.9)

## 2020-02-25 LAB — HEPARIN LEVEL (UNFRACTIONATED)
Heparin Unfractionated: 0.55 IU/mL (ref 0.30–0.70)
Heparin Unfractionated: 0.4 IU/mL (ref 0.30–0.70)

## 2020-02-25 LAB — TRIGLYCERIDES: Triglycerides: 113 mg/dL (ref <150)

## 2020-02-25 MED ORDER — INSULIN ASPART 100 UNIT/ML ~~LOC~~ SOLN
4.0000 [IU] | SUBCUTANEOUS | Status: DC
  Administered 2020-02-25 – 2020-02-26 (×4): 4 [IU] via SUBCUTANEOUS

## 2020-02-25 MED ORDER — INSULIN GLARGINE 100 UNIT/ML ~~LOC~~ SOLN
20.0000 [IU] | Freq: Every day | SUBCUTANEOUS | Status: DC
  Administered 2020-02-25 – 2020-03-04 (×7): 20 [IU] via SUBCUTANEOUS
  Filled 2020-02-25 (×9): qty 0.2

## 2020-02-25 MED ORDER — INSULIN ASPART 100 UNIT/ML ~~LOC~~ SOLN
4.0000 [IU] | SUBCUTANEOUS | Status: DC
  Administered 2020-02-25 (×2): 4 [IU] via SUBCUTANEOUS

## 2020-02-25 MED ORDER — VORICONAZOLE 200 MG PO TABS
200.0000 mg | ORAL_TABLET | Freq: Two times a day (BID) | ORAL | Status: DC
  Administered 2020-02-26 – 2020-03-04 (×15): 200 mg
  Filled 2020-02-25 (×16): qty 1

## 2020-02-25 MED ORDER — DEXMEDETOMIDINE HCL IN NACL 400 MCG/100ML IV SOLN
0.4000 ug/kg/h | INTRAVENOUS | Status: DC
  Administered 2020-02-25: 0.4 ug/kg/h via INTRAVENOUS
  Administered 2020-02-25: 0.7 ug/kg/h via INTRAVENOUS
  Administered 2020-02-26 – 2020-02-27 (×2): 0.4 ug/kg/h via INTRAVENOUS
  Administered 2020-02-28 – 2020-02-29 (×3): 0.6 ug/kg/h via INTRAVENOUS
  Administered 2020-03-01: 0.4 ug/kg/h via INTRAVENOUS
  Administered 2020-03-02: 1.2 ug/kg/h via INTRAVENOUS
  Administered 2020-03-02: 0.4 ug/kg/h via INTRAVENOUS
  Administered 2020-03-03: 0.6 ug/kg/h via INTRAVENOUS
  Administered 2020-03-04 (×2): 0.8 ug/kg/h via INTRAVENOUS
  Administered 2020-03-04: 04:00:00 0.6 ug/kg/h via INTRAVENOUS
  Administered 2020-03-05 (×2): 0.7 ug/kg/h via INTRAVENOUS
  Filled 2020-02-25 (×18): qty 100

## 2020-02-25 MED ORDER — VORICONAZOLE 200 MG PO TABS
400.0000 mg | ORAL_TABLET | Freq: Two times a day (BID) | ORAL | Status: AC
  Administered 2020-02-25 – 2020-02-26 (×2): 400 mg
  Filled 2020-02-25: qty 1
  Filled 2020-02-25 (×2): qty 2

## 2020-02-25 NOTE — Progress Notes (Signed)
ANTICOAGULATION CONSULT NOTE   Pharmacy Consult for Heparin Indication: Small subacute PE  No Known Allergies  Patient Measurements: Height: 5\' 2"  (157.5 cm) Weight: 125 lb 7.1 oz (56.9 kg) IBW/kg (Calculated) : 54.6 Heparin Dosing Weight: 52kg  Vital Signs: Temp: 97 F (36.1 C) (03/03 1545) Temp Source: Axillary (03/03 1545) BP: 149/60 (03/03 1800) Pulse Rate: 93 (03/03 1800)  Labs: Recent Labs    02/23/20 0420 02/23/20 1240 02/24/20 0301 02/24/20 0556 02/24/20 0657 02/24/20 1220 02/24/20 1755 02/24/20 2050 02/25/20 0428 02/25/20 0428 02/25/20 0933 02/25/20 1546 02/25/20 1800  HGB 8.5*   < > 7.7*   < > 7.9*   < >  --    < > 7.9*  7.8*   < > 7.5* 8.5*  --   HCT 27.0*   < > 25.3*   < > 25.8*   < >  --    < > 25.4*  23.0*  --  22.0* 25.0*  --   PLT 156  --  386  --  105*  --   --   --  126*  --   --   --   --   APTT 79*   < > 38*  --  136*  --   --   --  79*  --   --   --   --   HEPARINUNFRC 0.64   < > <0.10*   < > 0.82*  --  0.54  --  0.55  --   --   --  0.40  CREATININE 0.85  --  1.53*  --  1.06  --   --   --  1.59*  --   --   --   --   TROPONINIHS 145*  --   --   --   --   --   --   --   --   --   --   --   --    < > = values in this interval not displayed.    Estimated Creatinine Clearance: 29.6 mL/min (A) (by C-G formula based on SCr of 1.59 mg/dL (H)).   Assessment: 79 y.o. male with 01/27/20 small subacute PE, PTA Eliquis on hold, last dose 2/21. Started on heparin with low goal for H/H drop requiring transfusion on admit, stable H/H and platelets x5 days. Switched back to Apixaban 2/26 >> 2/27, back to IV heparin on 2/28 due to intubation.   Heparin level is now therapeutic; no bleeding reported.  Goal of Therapy:  Hep level 0.3 - 0.5 units/mL Monitor platelets by anticoagulation protocol: Yes   Plan:  Continue heparin gtt at 900 units/hr Daily heparin level and CBC  Shaquille Murdy D. 3/28, PharmD, BCPS, BCCCP 02/25/2020, 6:40 PM

## 2020-02-25 NOTE — Progress Notes (Signed)
Inpatient Diabetes Program Recommendations  AACE/ADA: New Consensus Statement on Inpatient Glycemic Control (2015)  Target Ranges:  Prepandial:   less than 140 mg/dL      Peak postprandial:   less than 180 mg/dL (1-2 hours)      Critically ill patients:  140 - 180 mg/dL   Lab Results  Component Value Date   GLUCAP 319 (H) 02/25/2020   HGBA1C 7.1 (H) 02/16/2020    Review of Glycemic Control  Results for DEARIS, DANIS (MRN [id number]) as of 02/25/2020 13:23  Ref. Range 02/24/2020 21:03 02/25/2020 00:12 02/25/2020 04:02 02/25/2020 08:26 02/25/2020 11:24  Glucose-Capillary Latest Ref Range: 70 - 99 mg/dL 276 (H) 147 (H) 092 (H) 287 (H) 319 (H)    Outpatient Diabetes medications: None Current orders for Inpatient glycemic control: Lantus 20 units to start today at noon + Novolog 0-15 units Q4H + Solumedrol 40 mg Q12H  Inpatient Diabetes Program Recommendations:     Noted Lantus 20 units started today.  If steroids continue please consider  -Novolog 4 units every 4 hours tube feed coverage.  Stop is tube feeds held or discontinued  Thank you, Dulce Sellar, RN, BSN Diabetes Coordinator Inpatient Diabetes Program [phone] (team pager from 8a-5p)

## 2020-02-25 NOTE — Progress Notes (Signed)
ANTICOAGULATION CONSULT NOTE   Pharmacy Consult for Heparin Indication: 01/27/20 small subacute PE  No Known Allergies  Patient Measurements: Height: 5\' 2"  (157.5 cm) Weight: 125 lb 7.1 oz (56.9 kg) IBW/kg (Calculated) : 54.6 Heparin Dosing Weight: 52kg  Vital Signs: Temp: 97 F (36.1 C) (03/03 0400) Temp Source: Axillary (03/03 0400) BP: 156/64 (03/03 0500) Pulse Rate: 93 (03/03 0500)  Labs: Recent Labs    02/23/20 0420 02/23/20 1240 02/24/20 0301 02/24/20 0556 02/24/20 0657 02/24/20 0657 02/24/20 1220 02/24/20 1220 02/24/20 1755 02/24/20 2050 02/25/20 0428  HGB 8.5*   < > 7.7*   < > 7.9*   < > 11.2*   < >  --  8.2* 7.9*  7.8*  HCT 27.0*   < > 25.3*   < > 25.8*   < > 33.0*  --   --  24.0* 25.4*  23.0*  PLT 156  --  386  --  105*  --   --   --   --   --  126*  APTT 79*   < > 38*  --  136*  --   --   --   --   --  79*  HEPARINUNFRC 0.64   < > <0.10*   < > 0.82*  --   --   --  0.54  --  0.55  CREATININE 0.85  --  1.53*  --  1.06  --   --   --   --   --  1.59*  TROPONINIHS 145*  --   --   --   --   --   --   --   --   --   --    < > = values in this interval not displayed.    Estimated Creatinine Clearance: 29.6 mL/min (A) (by C-G formula based on SCr of 1.59 mg/dL (H)).   Assessment: 79 y.o. male with 01/27/20 small subacute PE, PTA Eliquis on hold, last dose 2/21. Started on heparin with low goal for H/H drop requiring transfusion on admit, stable H/H and platelets x5 days.  Initially switched back to apixaban 2/26, now NPO, pending possible intubation  Last dose apixaban last evening ~ 2200  CBC stable  3/3 AM update:  Heparin level just above goal  Goal of Therapy:  Heparin level 0.3-0.7 units/mL Monitor platelets by anticoagulation protocol: Yes   Plan:  Dec heparin to 900 units/hr 1400 heparin level   3/26, PharmD, BCPS Clinical Pharmacist Phone: [phone]

## 2020-02-25 NOTE — Progress Notes (Signed)
[place] for Infectious Disease    Date of Admission:  02/15/2020   Total days of antibiotics 11/ day 10 cefepime/day 4 bactrim/day 3 metro          ID: Bruce Webb is a 79 y.o. male with hx of covid admitted for respiratory distress found to have new cavitary pneumonia Principal Problem:   Acute GI bleeding Active Problems:   Essential hypertension   Pulmonary embolism ([place])   Pneumonia with cavity of lung   Hyponatremia   Hyperglycemia   Pressure injury of skin   Goals of care, counseling/discussion   Palliative care by specialist    Subjective: Scant secretions per RT note, still requiring intubation  Medications:  . brimonidine  1 drop Both Eyes BID  . budesonide (PULMICORT) nebulizer solution  0.5 mg Nebulization BID  . chlorhexidine gluconate (MEDLINE KIT)  15 mL Mouth Rinse BID  . Chlorhexidine Gluconate Cloth  6 each Topical Daily  . dorzolamide  1 drop Both Eyes BID  . insulin aspart  0-15 Units Subcutaneous Q4H  . insulin aspart  4 Units Subcutaneous Q4H  . insulin glargine  20 Units Subcutaneous Daily  . latanoprost  1 drop Both Eyes QHS  . mouth rinse  15 mL Mouth Rinse 10 times per day  . methylPREDNISolone (SOLU-MEDROL) injection  40 mg Intravenous Q12H  . midodrine  10 mg Per Tube BID WC  . multivitamin with minerals  1 tablet Per Tube Daily  . pantoprazole (PROTONIX) IV  40 mg Intravenous Q12H  . polyethylene glycol  17 g Per Tube Daily  . rocuronium  52 mg Intravenous Once  . senna-docusate  2 tablet Per Tube BID  . sodium chloride flush  10-40 mL Intracatheter Q12H  . sodium chloride flush  3 mL Intravenous Q12H  . sodium chloride flush  3 mL Intravenous Q12H  . timolol  1 drop Both Eyes BID  . [START ON 02/26/2020] voriconazole  200 mg Per Tube Q12H  . voriconazole  400 mg Per Tube Q12H  . white petrolatum   Topical BID    Objective: Vital signs in last 24 hours: Temp:  [97 F (36.1 C)-98.5 F (36.9 C)] 97 F (36.1 C) (03/03  1545) Pulse Rate:  [74-99] 78 (03/03 1700) Resp:  [8-36] 20 (03/03 1600) BP: (108-156)/(45-66) 129/57 (03/03 1700) SpO2:  [95 %-100 %] 98 % (03/03 1700) Arterial Line BP: (87-145)/(45-62) 125/58 (03/03 1700) FiO2 (%):  [50 %-60 %] 50 % (03/03 1700) Weight:  [56.9 kg] 56.9 kg (03/03 0407) gen = sedated, appears chronically ill HEENT = OETT in place  Lab Results Recent Labs    02/24/20 0657 02/24/20 1220 02/25/20 0428 02/25/20 0428 02/25/20 0933 02/25/20 1546  WBC 34.7*  --  38.4*  --   --   --   HGB 7.9*   < > 7.9*  7.8*   < > 7.5* 8.5*  HCT 25.8*   < > 25.4*  23.0*   < > 22.0* 25.0*  NA 129*   < > 128*  130*   < > 127* 128*  K 4.8   < > 4.3  4.3   < > 4.7 4.7  CL 100  --  99  --   --   --   CO2 22  --  20*  --   --   --   BUN 39*  --  54*  --   --   --   CREATININE 1.06  --  1.59*  --   --   --    < > = values in this interval not displayed.   Liver Panel Recent Labs    02/24/20 0301 02/25/20 0428  PROT 8.6* 5.0*  ALBUMIN 1.9* 1.3*  AST 87* 22  ALT 102* 40  ALKPHOS 239* 138*  BILITOT 0.5 0.4    Microbiology: +aspergillus BAL = AFB smear negative  Studies/Results: DG Chest Port 1 View  Result Date: 02/25/2020 CLINICAL DATA:  COVID pneumonia. EXAM: PORTABLE CHEST 1 VIEW COMPARISON:  Chest x-ray 02/22/2020 FINDINGS: The endotracheal tube is 5 cm above the carina. The NG tube is coursing down the esophagus and into the stomach. The right PICC line tip is in good position with its tip near the cavoatrial junction. The cardiac silhouette, mediastinal and hilar contours are within normal limits and stable. Persistent bilateral patchy interstitial and airspace infiltrates consistent with COVID pneumonia. No new or progressive findings. No pleural effusions. IMPRESSION: 1. Stable support apparatus.  New right PICC line in good position. 2. Persistent bilateral interstitial and airspace infiltrates. Electronically Signed   By: Marijo Sanes M.D.   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 Will start voriconazole today. There is an association of aspergillus infection post covid (due to immune suppression with steroids, tociclizimab)  Rule out for mTB = recommend 2 more samples of aspirate/sputum to be sent for 3 specimens to be negative so that airborne precautions can be discontinues  Will discontinue bactrim since unlikely to be pneumocystis pneumonia. We will resend specimen since first specimen is lost. Benefit to stopping bactrim is that patient has had doubling of his Cr/AKI since starting bactrim, as well  Antibacterial coverage for pneumonia- currently day 11, can finish course at day 14 of cefepime and metronidazole.   [facility] for Infectious Diseases Cell: [phone] Pager: [phone]  02/25/2020, 5:36 PM

## 2020-02-25 NOTE — Progress Notes (Signed)
ID Pharmacy Note  Aspergillus Ab + so will start voriconazole per Dr. Drue Second. Noted that with CrCl < 50 ml/min IV voriconazole is not recommended due to accumulation of cyclodextran.   Will give voriconazole 400 mg q 12 X 2 doses and then 200 mg BID per tube. Tube feeds should be held 1 hour before and 1 hour after voriconazole doses to enhance absorption of the doses.    Sharin Mons, PharmD, BCPS, BCIDP Infectious Diseases Clinical Pharmacist Phone: [phone] 02/25/2020 4:42 PM

## 2020-02-25 NOTE — Progress Notes (Signed)
NAME:  Bruce Webb, MRN:  [id number], DOB:  Oct 08, 1941, LOS: 10 ADMISSION DATE:  02/15/2020, CONSULTATION DATE:  02/19/2020 REFERRING MD:  Dr. Karleen Hampshire, CHIEF COMPLAINT:  Hypotension/ hypoxia    Brief History   79 year old male post COVID and PE in January on Eliquis admitted 2/21 to [facility] for extertional dyspnea found to be anemic with hemoccult positive stools.  GI consulted and transfused PRBC with since stabilized Hgb but has had persistent higher O2 requirements.  Was diuresed but today developed hypotension, with ongoing higher O2 requirements, PCCM was asked to consult.     History of present illness   HPI obtained via interpreter and from daughter.   79 year old male, who speaks [place], with history of HTN, HFpEF, DMT2, glaucoma with recent COVID 01/16/2020 with subsequent subacute PE on Eliquis and home O2 2L [place] who presented 2/21 with exertional dyspnea, black tarry stools, and worsening vision loss in his right eye.  Patient lives at home with his daughter and pre-COVID was independent in his ADLs.  He is a retired and prior worked in an office.  Denies any smoking or pulmonary history.  Reports prior to COVID, had a chronic non-productive cough for 10-15 years and his primary care MD treated it occasionally with cough medicine.   Patient with recent hospitalization at [facility] for [place] after initial positive 1/22 for failed outpatient treatment for COVID infection despite monoclonal antibodies and steroids.  While at [facility], treated with remdesivir and decadron and found to have a PE and was discharged home on 2/6 on 2L O2 and Eliquis.   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    On admit, found to have worsening anemia, 8.8-> 6 with positive fecal occult blood testing and hypotension responsive to fluid.  Additionally found to have WBC 28k, CXR with worsening multifocal airspace consolidation bilaterally.  He was started  empirically on zithromax and cefepime. PCT 0.5.  He was transfused with 2 units PRBC, started on protonix, and GI consulted. However, given tenuous respiratory status and increased O2 needs, GI deferred EGD.  Hgb trend since stable and therefore placed on heparin gtt on 2/23.  He was diuresed on 2/23 given fluid overload post blood transfusion with good output then but has since required midodrine to help with soft blood pressures.  On 2/25, he had worsening hypotension despite midodrine, ongoing elevated O2 needs, and transaminitis, PCCM asked to consult.     Past Medical History  HTN, HFpEF, DMT2, glaucoma with progressive vision loss (left, now rapid loss in right) - recent COVID 01/16/2020 with subsequent PE on Eliquis and O2 2L   Significant Hospital Events   2/21 admit. Stool ob =negatoive  2/23- echo ef 65%  2/25 PCCM consulted for hypotension and persistent hypoxemia  2/26 - Has severe desaturations with activity and eating. Required 10L while eating his meal today. No acute distress but continues to have shortness of breath. Denies chest pain.   2/27 -  -remains on 10 L.  He easily desaturates.  [name] at the bedside.  I spoke to her and subsequently also to their family friend who is in physician assistant in cardiology [place]).  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  According to the daughter he got worse with COVID-19 then got better went home but he declined again in terms of his hypoxemia.  I personally visualized the current  CT scan of the chest compared to 4 weeks ago and to my visualization it looks like an extension of his pulmonary infiltrates from the original COVID-19 admission including a right right upper lobe anterior segment cavity thick-walled.   Consults:  GI  PCCM  Procedures:   Significant Diagnostic Tests:  2/21 CTH >> 1. No acute intracranial abnormalities. 2. Mild cerebral atrophy with chronic  microvascular ischemic changes in the cerebral white matter, as above.  CTA 01/27/20 - Diffuse subpleural/peripheral interstitial ground glass opacities with increased involvement in the the lower lobes bilaterally R>L. CXR 02/16/20 R>L multifocal pneumonia R>L, no edema or effusion  2/23 TTE >> 1. Left ventricular ejection fraction, by estimation, is 60 to 65%. The left ventricle has normal function. The left ventricle has no regional wall motion abnormalities. Left ventricular diastolic parameters are consistent with Grade II diastolic dysfunction (pseudonormalization).  2. The history indicates the patient has a pulmonary embolus. The RV is  not dilated and there is normal RV function. . Right ventricular systolic function is low normal. The right ventricular size is normal.  3. The mitral valve is normal in structure and function. Mild mitral valve regurgitation. No evidence of mitral stenosis.  4. The aortic valve is normal in structure and function. Aortic valve regurgitation is not visualized. No aortic stenosis is present.   Micro Data:  2/21 BCx 2 >> ngtd x 4 days 2/25 - CCP 2/27 - quant gold -  2/27 RVP - negative 2/28  - urine strep - negative 228 - urine leg -  2/28 - aspergillus Ab -  xxxxxxxxxxxx 3/1 BAL Cx, fungal, acid fast >> 3/1 BAL PCP>>  Antimicrobials:  2/21 azithro>>2/26 2/21 ceftriaxone  2/22 cefepime >> 2/28 Bactrim > 3/1 Flagyl>  Interim history/subjective:  Improving oxygenation. Sedated, mechanical ventilation.  Objective   Blood pressure (!) 122/50, pulse 84, temperature 97.6 F (36.4 C), temperature source Oral, resp. rate (!) 24, height _0  (1.575 m), weight 56.9 kg, SpO2 100 %.    Vent Mode: PCV FiO2 (%):  [60 %] 60 % Set Rate:  [28 bmp-30 bmp] 28 bmp PEEP:  [10 cmH20] 10 cmH20 Plateau Pressure:  [19 cmH20-40 cmH20] 24 cmH20   Intake/Output Summary (Last 24 hours) at 02/25/2020 1122 Last data filed at 02/25/2020 1000 Gross per 24 hour    Intake 3036.84 ml  Output 750 ml  Net 2286.84 ml   Filed Weights   02/21/20 0421 02/24/20 0500 02/25/20 0407  Weight: 52.3 kg 54.5 kg 56.9 kg   Physical Exam: General: Chronically ill-appearing, no acute distress HENT: [place], AT, ETT in place Eyes: EOMI, no scleral icterus Respiratory: Clear to auscultation bilaterally.  No crackles, wheezing or rales Cardiovascular: RRR, -M/R/G, no JVD GI: BS+, soft, nontender Extremities:Anasarca,-tenderness Neuro: Sedated, grimaces to pain GU: Foley in place  Resolved Hospital Problem list   Transaminitis -resolved.worsening likely in setting of shock. RUQ unrevealing  Assessment & Plan:  79 year old male never smoker with prior COVID-19 infection diagnosed in January and requiring hospitalization from 2/1-2/6, recently diagnosed pulmonary emboli on anticoagulation and end-stage glaucoma with right vision loss in addition to his known left vision loss who presents with worsening shortness of breath and admitted for acute on chronic respiratory failure secondary to multifocal pneumonia. Has had required high O2 requirements during the course of this hospitalization despite steroid therapy and transferred to the ICU on 2/28.  ARDS/Acute hypoxemic respiratory failure secondary to cavitary pneumonia. Likely multifactorial with bacterial/necrotizing pneumonia in setting  of post-inflammatory COVID-19 infection vs pulmonary fibrosis, atelectasis, pulmonary emboli. Low suspicion for TB even though he is from an endemic area; PPD neg and immigrated >10 years. BAL cell count significant for neutrophilic predominance suggesting bacterial infection. Autoimmune work-up, Quantiferon gold and aspergillus ab pending. --Full vent support --Wean FIO2/PEEP for goal SpO2 88-95% or pO2 55-80 --ABG PRN --Appreciate ID input. On Bactrim for possible PJP. Continue Cefepime and Flagyl. --F/u cultures including BAL --F/u PCP --Continue BID solumedrol --PAD protocol: Wean  fentanyl and propofol for goal RASS -1 --Hold on diuresis  Septic shock secondary to bacterial pneumonia. LA wnl --Continue antibiotics per ID --Wean levo gtt for MAP goal >65 --Trend lactic acid --F/u cultures  AKI S/p lasix yesterday.  --Hold diuretics today --Monitor UOP/Cr  Right pulmonary emboli --Continue heparin gtt --Trend CBC with hx of melena prior to admission  Acute blood loss anemia, melena. S/p 2U PRBC and Hg stable --GI consulted but unable to perform EGD due to respiratory status --Trend CBC --PPI BID  Hyponatremia --Trend BMP daily  Hyperglycemia --SSI --Add Lantus  Chronic diastolic heart failure --No action  Acute urinary retention 2/27 --Foley in place  GOC --Guarded prognosis in setting of respiratory failure in patient with baseline lung function since his COVID infection. Appreciate palliative consult.  Best practice:  Diet: TF Pain/Anxiety/Delirium protocol (if indicated): yes VAP protocol (if indicated): yes DVT prophylaxis: heparin gtt (stop eliquis) GI prophylaxis: PPI bid dosing Glucose control: SSI Mobility: push  Code Status: Full  Family Communication:d.w Rowe Pavy the PA in cards who his husband ofpatient's distance niece and is family spokesperson due to medical knowledge.   Called patient's distant nephew. No answer. Left message for callback.  Disposition: ICU  SIGNATURE   The patient is critically ill with multiple organ systems failure and requires high complexity decision making for assessment and support, frequent evaluation and titration of therapies, application of advanced monitoring technologies and extensive interpretation of multiple databases.   Critical Care Time devoted to patient care services described in this note is 40 Minutes.  Rodman Pickle, M.D. [facility] Pulmonary/Critical Care Medicine 02/25/2020 1:22 PM   Please see Amion for pager number to reach on-call Pulmonary and Critical Care Team.   LABS     PULMONARY Recent Labs  Lab 02/24/20 0556 02/24/20 1220 02/24/20 2050 02/25/20 0428 02/25/20 0933  PHART 7.277* 7.320* 7.334* 7.327* 7.317*  PCO2ART 52.8* 42.9 37.5 41.0 39.7  PO2ART 100.0 71.0* 77.0* 87.0 80.0*  HCO3 24.8 22.2 19.9* 21.5 20.4  TCO2 26 23 21* 23 22  O2SAT 97.0 93.0 94.0 96.0 95.0    CBC Recent Labs  Lab 02/24/20 0301 02/24/20 0556 02/24/20 0657 02/24/20 1220 02/24/20 2050 02/25/20 0428 02/25/20 0933  HGB 7.7*   < > 7.9*   < > 8.2* 7.9*  7.8* 7.5*  HCT 25.3*   < > 25.8*   < > 24.0* 25.4*  23.0* 22.0*  WBC 20.3*  --  34.7*  --   --  38.4*  --   PLT 386  --  105*  --   --  126*  --    < > = values in this interval not displayed.    COAGULATION Recent Labs  Lab 02/19/20 1635  INR 1.2    CARDIAC  No results for input(s): TROPONINI in the last 168 hours. No results for input(s): PROBNP in the last 168 hours.   CHEMISTRY Recent Labs  Lab 02/20/20 1045 02/20/20 1045 02/22/20 0352 02/22/20 1830 02/23/20 0420  02/23/20 1441 02/24/20 0301 02/24/20 0556 02/24/20 0657 02/24/20 0657 02/24/20 1220 02/24/20 1220 02/24/20 2050 02/24/20 2050 02/25/20 0428 02/25/20 0933  NA 137   < > 134*   < > 137   < > 149*   < > 129*  --  129*  --  128*  --  128*  130* 127*  K 4.6   < > 4.8   < > 4.4   < > 4.3   < > 4.8   < > 4.6   < > 4.8   < > 4.3  4.3 4.7  CL 103   < > 102  --  103  --  116*  --  100  --   --   --   --   --  99  --   CO2 26   < > 26  --  27  --  23  --  22  --   --   --   --   --  20*  --   GLUCOSE 180*   < > 161*  --  107*  --  206*  --  391*  --   --   --   --   --  224*  --   BUN 30*   < > 44*  --  37*  --  110*  --  39*  --   --   --   --   --  54*  --   CREATININE 1.10   < > 0.79  --  0.85  --  1.53*  --  1.06  --   --   --   --   --  1.59*  --   CALCIUM 8.1*   < > 8.5*  --  7.5*  --  9.2  --  7.2*  --   --   --   --   --  7.2*  --   MG 2.0  --   --   --   --   --   --   --   --   --   --   --   --   --   --   --    < > =  values in this interval not displayed.   Estimated Creatinine Clearance: 29.6 mL/min (A) (by C-G formula based on SCr of 1.59 mg/dL (H)).   LIVER Recent Labs  Lab 02/19/20 0315 02/19/20 1635 02/21/20 1205 02/22/20 0352 02/23/20 0420 02/24/20 0301 02/25/20 0428  AST   < >  --  77* 98* 35 87* 22  ALT   < >  --  116* 124* 78* 102* 40  ALKPHOS   < >  --  167* 171* 131* 239* 138*  BILITOT   < >  --  0.3 0.5 0.5 0.5 0.4  PROT   < >  --  6.4* 5.6* 5.0* 8.6* 5.0*  ALBUMIN   < >  --  1.3* 1.5* 1.4* 1.9* 1.3*  INR  --  1.2  --   --   --   --   --    < > = values in this interval not displayed.     INFECTIOUS Recent Labs  Lab 02/19/20 1202 02/19/20 1635 02/23/20 0420  LATICACIDVEN 2.4* 1.9 1.5  PROCALCITON  --  0.99  --      ENDOCRINE CBG (last 3)  Recent Labs    02/25/20 0012 02/25/20 0402 02/25/20 2376  GLUCAP 313* 221* 287*         IMAGING x48h  - image(s) personally visualized  -   highlighted in bold No results found.

## 2020-02-25 NOTE — Progress Notes (Signed)
ANTICOAGULATION CONSULT NOTE   Pharmacy Consult for Heparin Indication: 01/27/20 small subacute PE  No Known Allergies  Patient Measurements: Height: 5\' 2"  (157.5 cm) Weight: 125 lb 7.1 oz (56.9 kg) IBW/kg (Calculated) : 54.6 Heparin Dosing Weight: 52kg  Vital Signs: Temp: 97.6 F (36.4 C) (03/03 0829) Temp Source: Oral (03/03 0829) BP: 113/50 (03/03 1200) Pulse Rate: 78 (03/03 1200)  Labs: Recent Labs    02/23/20 0420 02/23/20 1240 02/24/20 0301 02/24/20 0556 02/24/20 0657 02/24/20 1220 02/24/20 1755 02/24/20 2050 02/24/20 2050 02/25/20 0428 02/25/20 0933  HGB 8.5*   < > 7.7*   < > 7.9*   < >  --  8.2*   < > 7.9*  7.8* 7.5*  HCT 27.0*   < > 25.3*   < > 25.8*   < >  --  24.0*  --  25.4*  23.0* 22.0*  PLT 156  --  386  --  105*  --   --   --   --  126*  --   APTT 79*   < > 38*  --  136*  --   --   --   --  79*  --   HEPARINUNFRC 0.64   < > <0.10*   < > 0.82*  --  0.54  --   --  0.55  --   CREATININE 0.85  --  1.53*  --  1.06  --   --   --   --  1.59*  --   TROPONINIHS 145*  --   --   --   --   --   --   --   --   --   --    < > = values in this interval not displayed.    Estimated Creatinine Clearance: 29.6 mL/min (A) (by C-G formula based on SCr of 1.59 mg/dL (H)).   Assessment: 79 y.o. male with 01/27/20 small subacute PE, PTA Eliquis on hold, last dose 2/21. Started on heparin with low goal for H/H drop requiring transfusion on admit, stable H/H and platelets x5 days. Switched back to Apixaban 2/26 >> 2/27, back to Heparin IV on 2/28 due to intubation.   Hep lvl slightly above goal today - 0.55  Goal of Therapy:  Hep lvl 0.3 - 0.5 Monitor platelets by anticoagulation protocol: Yes   Plan:  Reduce heparin to 900 units/hr Next lvl 1800 Daily hep lvl cbc  3/28, PharmD, BCPS, BCCCP Clinical Pharmacist [phone]  Please check AMION for all [facility] Pharmacy numbers  02/25/2020 1:00 PM

## 2020-02-25 NOTE — Progress Notes (Signed)
eLink Physician-Brief Progress Note Patient Name: Kaleab Frasier DOB: [birth year] MRN: [id number]   Date of Service  02/25/2020  HPI/Events of Note  Agitation - Request for bilateral soft wrist restraints.   eICU Interventions  Will order bilateral soft wrist restraints X 2 hours.      Intervention Category Major Interventions: Delirium, psychosis, severe agitation - evaluation and management  Caine Barfield [place] 02/25/2020, 6:35 AM

## 2020-02-26 DIAGNOSIS — R739 Hyperglycemia, unspecified: Secondary | ICD-10-CM

## 2020-02-26 DIAGNOSIS — Z7189 Other specified counseling: Secondary | ICD-10-CM

## 2020-02-26 DIAGNOSIS — J189 Pneumonia, unspecified organism: Secondary | ICD-10-CM

## 2020-02-26 LAB — COMPREHENSIVE METABOLIC PANEL
ALT: 32 U/L (ref 0–44)
AST: 26 U/L (ref 15–41)
Albumin: 1.3 g/dL — ABNORMAL LOW (ref 3.5–5.0)
Alkaline Phosphatase: 123 U/L (ref 38–126)
Anion gap: 9 (ref 5–15)
BUN: 63 mg/dL — ABNORMAL HIGH (ref 8–23)
CO2: 18 mmol/L — ABNORMAL LOW (ref 22–32)
Calcium: 7.2 mg/dL — ABNORMAL LOW (ref 8.9–10.3)
Chloride: 102 mmol/L (ref 98–111)
Creatinine, Ser: 1.5 mg/dL — ABNORMAL HIGH (ref 0.61–1.24)
GFR calc Af Amer: 51 mL/min — ABNORMAL LOW (ref >60)
GFR calc non Af Amer: 44 mL/min — ABNORMAL LOW (ref >60)
Glucose, Bld: 187 mg/dL — ABNORMAL HIGH (ref 70–99)
Potassium: 5.1 mmol/L (ref 3.5–5.1)
Sodium: 129 mmol/L — ABNORMAL LOW (ref 135–145)
Total Bilirubin: 0.5 mg/dL (ref 0.3–1.2)
Total Protein: 4.9 g/dL — ABNORMAL LOW (ref 6.5–8.1)

## 2020-02-26 LAB — POCT I-STAT 7, (LYTES, BLD GAS, ICA,H+H)
Acid-base deficit: 5 mmol/L — ABNORMAL HIGH (ref 0.0–2.0)
Acid-base deficit: 5 mmol/L — ABNORMAL HIGH (ref 0.0–2.0)
Bicarbonate: 19.1 mmol/L — ABNORMAL LOW (ref 20.0–28.0)
Bicarbonate: 18.5 mmol/L — ABNORMAL LOW (ref 20.0–28.0)
Calcium, Ion: 1.09 mmol/L — ABNORMAL LOW (ref 1.15–1.40)
Calcium, Ion: 1.14 mmol/L — ABNORMAL LOW (ref 1.15–1.40)
HCT: 44 % (ref 39.0–52.0)
HCT: 32 % — ABNORMAL LOW (ref 39.0–52.0)
Hemoglobin: 15 g/dL (ref 13.0–17.0)
Hemoglobin: 10.9 g/dL — ABNORMAL LOW (ref 13.0–17.0)
O2 Saturation: 91 %
O2 Saturation: 92 %
Patient temperature: 97.3
Patient temperature: 97.6
Potassium: 4.7 mmol/L (ref 3.5–5.1)
Potassium: 5.1 mmol/L (ref 3.5–5.1)
Sodium: 131 mmol/L — ABNORMAL LOW (ref 135–145)
Sodium: 130 mmol/L — ABNORMAL LOW (ref 135–145)
TCO2: 20 mmol/L — ABNORMAL LOW (ref 22–32)
TCO2: 19 mmol/L — ABNORMAL LOW (ref 22–32)
pCO2 arterial: 32.6 mmHg (ref 32.0–48.0)
pCO2 arterial: 29.6 mmHg — ABNORMAL LOW (ref 32.0–48.0)
pH, Arterial: 7.372 (ref 7.350–7.450)
pH, Arterial: 7.401 (ref 7.350–7.450)
pO2, Arterial: 61 mmHg — ABNORMAL LOW (ref 83.0–108.0)
pO2, Arterial: 61 mmHg — ABNORMAL LOW (ref 83.0–108.0)

## 2020-02-26 LAB — CBC WITH DIFFERENTIAL/PLATELET
Abs Immature Granulocytes: 2.27 10*3/uL — ABNORMAL HIGH (ref 0.00–0.07)
Basophils Absolute: 0.1 10*3/uL (ref 0.0–0.1)
Basophils Relative: 0 %
Eosinophils Absolute: 0 10*3/uL (ref 0.0–0.5)
Eosinophils Relative: 0 %
HCT: 24.4 % — ABNORMAL LOW (ref 39.0–52.0)
Hemoglobin: 7.9 g/dL — ABNORMAL LOW (ref 13.0–17.0)
Immature Granulocytes: 6 %
Lymphocytes Relative: 3 %
Lymphs Abs: 1.2 10*3/uL (ref 0.7–4.0)
MCH: 27.9 pg (ref 26.0–34.0)
MCHC: 32.4 g/dL (ref 30.0–36.0)
MCV: 86.2 fL (ref 80.0–100.0)
Monocytes Absolute: 1.1 10*3/uL — ABNORMAL HIGH (ref 0.1–1.0)
Monocytes Relative: 3 %
Neutro Abs: 35.8 10*3/uL — ABNORMAL HIGH (ref 1.7–7.7)
Neutrophils Relative %: 88 %
Platelets: 156 10*3/uL (ref 150–400)
RBC: 2.83 MIL/uL — ABNORMAL LOW (ref 4.22–5.81)
RDW: 17 % — ABNORMAL HIGH (ref 11.5–15.5)
WBC: 40.5 10*3/uL — ABNORMAL HIGH (ref 4.0–10.5)
nRBC: 0.1 % (ref 0.0–0.2)

## 2020-02-26 LAB — GLUCOSE, CAPILLARY
Glucose-Capillary: 110 mg/dL — ABNORMAL HIGH (ref 70–99)
Glucose-Capillary: 161 mg/dL — ABNORMAL HIGH (ref 70–99)
Glucose-Capillary: 184 mg/dL — ABNORMAL HIGH (ref 70–99)
Glucose-Capillary: 205 mg/dL — ABNORMAL HIGH (ref 70–99)
Glucose-Capillary: 70 mg/dL (ref 70–99)
Glucose-Capillary: 83 mg/dL (ref 70–99)

## 2020-02-26 LAB — APTT: aPTT: 91 seconds — ABNORMAL HIGH (ref 24–36)

## 2020-02-26 LAB — ACID FAST SMEAR (AFB, MYCOBACTERIA): Acid Fast Smear: NEGATIVE

## 2020-02-26 LAB — HEPARIN LEVEL (UNFRACTIONATED)
Heparin Unfractionated: 0.57 IU/mL (ref 0.30–0.70)
Heparin Unfractionated: 0.48 IU/mL (ref 0.30–0.70)

## 2020-02-26 LAB — FUNGITELL, SERUM: Fungitell Result: <31 pg/mL (ref <80)

## 2020-02-26 MED ORDER — LINEZOLID 600 MG PO TABS
600.0000 mg | ORAL_TABLET | Freq: Two times a day (BID) | ORAL | Status: DC
  Administered 2020-02-26 – 2020-02-27 (×3): 600 mg
  Filled 2020-02-26 (×4): qty 1

## 2020-02-26 MED ORDER — INSULIN ASPART 100 UNIT/ML ~~LOC~~ SOLN
6.0000 [IU] | SUBCUTANEOUS | Status: DC
  Administered 2020-02-26 – 2020-03-04 (×33): 6 [IU] via SUBCUTANEOUS

## 2020-02-26 MED ORDER — OXYCODONE HCL 5 MG/5ML PO SOLN
5.0000 mg | Freq: Three times a day (TID) | ORAL | Status: DC
  Administered 2020-02-26 – 2020-02-28 (×6): 5 mg
  Filled 2020-02-26 (×6): qty 5

## 2020-02-26 MED ORDER — LINEZOLID 100 MG/5ML PO SUSR
600.0000 mg | Freq: Two times a day (BID) | ORAL | Status: DC
  Filled 2020-02-26 (×3): qty 30

## 2020-02-26 MED ORDER — FUROSEMIDE 10 MG/ML IJ SOLN
20.0000 mg | Freq: Once | INTRAMUSCULAR | Status: AC
  Administered 2020-02-26: 20 mg via INTRAVENOUS
  Filled 2020-02-26: qty 2

## 2020-02-26 MED ORDER — PANTOPRAZOLE SODIUM 40 MG IV SOLR
40.0000 mg | INTRAVENOUS | Status: DC
  Administered 2020-02-27 – 2020-03-04 (×7): 40 mg via INTRAVENOUS
  Filled 2020-02-26 (×7): qty 40

## 2020-02-26 MED ORDER — VITAL AF 1.2 CAL PO LIQD
1000.0000 mL | ORAL | Status: DC
  Administered 2020-02-27 – 2020-03-04 (×8): 1000 mL
  Filled 2020-02-26 (×3): qty 1000

## 2020-02-26 NOTE — Progress Notes (Signed)
Inpatient Diabetes Program Recommendations  AACE/ADA: New Consensus Statement on Inpatient Glycemic Control (2015)  Target Ranges:  Prepandial:   less than 140 mg/dL      Peak postprandial:   less than 180 mg/dL (1-2 hours)      Critically ill patients:  140 - 180 mg/dL   Lab Results  Component Value Date   GLUCAP 205 (H) 02/26/2020   HGBA1C 7.1 (H) 02/16/2020    Review of Glycemic Control Results for Bruce Webb, Bruce Webb (MRN [id number]) as of 02/26/2020 09:59  Ref. Range 02/25/2020 11:24 02/25/2020 15:33 02/25/2020 20:14 02/25/2020 23:36 02/26/2020 03:39 02/26/2020 08:24  Glucose-Capillary Latest Ref Range: 70 - 99 mg/dL 288 (H)  Novolog 4 units  Lantus 20 units 275 (H)  Novolog 12 units 233 (H)  Novolog 9 units 181 (H)  Novolog 7 units 161 (H)  Novolog 7 units 205 (H)  Novolog 9 units  Lantus 20 units   Diabetes history: None  Current orders for Inpatient glycemic control:  Lantus 20 units Daily Novolog 0-15 units Q4 hours Novolog 4 units Q4 hours Tube Feed Coverage  Vital AF 50 ml/hour Solumedrol 40 mg Q12 hours  Inpatient Diabetes Program Recommendations:    Increase Novolog Tube Feed Coverage to 6 units Q4 hours.  Thanks,  Christena Deem RN, MSN, BC-ADM Inpatient Diabetes Coordinator Team Pager [phone] (8a-5p)

## 2020-02-26 NOTE — Progress Notes (Signed)
Daily Progress Note   Patient Name: Bruce Webb       Date: 02/26/2020 DOB: 1941-06-23  Age: 79 y.o. MRN#: [id number] Attending Physician: Bruce Seeds, MD Primary Care Physician: Bruce Chroman, MD Admit Date: 02/15/2020  Reason for Consultation/Follow-up: Establishing goals of care and Psychosocial/spiritual support  Subjective: Discussed with bedside RN and CCM MD.  [facility] on the phone.  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  The patient's wife and daughters are his decision makers.  I explained that I'm with Palliative and calling to introduce myself.  Bruce Webb asks several specific questions regarding vent settings, white count, antibiotic therapy.  He questions whether or not a bronchoscopy would be helpful to provide a specific diagnosis allowing for more targeted treatment.  He expresses concern over the rising WBC.  We talked about code status and trach vs 1 way extubation.  Would Bruce Webb want to live in nursing home long term?   Perhaps the family can gather in the next few days and we can discuss his values and what he would want.   Bruce Webb relates that last weekend when Bruce Webb needed intubation the family chose full code status.  Assessment: Patient post covid, being ruled out for TB.  Intubated.  Ventilator requirements appear stable, but he is not weaning at this time.   Very deconditioned, debilitated, severe malnutrition.   Patient Profile/HPI:  79 y.o. male  with past medical history of hypertension, diet-controlled diabetes, history of recent Covid infection 1/22 admitted on 02/15/2020 with pneumonia due to COVID-19.  Suffered with GI bleed, progressive vision loss, and worsening hypotension and hypoxia.  Intubated 2/28.    Length of  Stay: 11  Current Medications: Scheduled Meds:  . brimonidine  1 drop Both Eyes BID  . budesonide (PULMICORT) nebulizer solution  0.5 mg Nebulization BID  . chlorhexidine gluconate (MEDLINE KIT)  15 mL Mouth Rinse BID  . Chlorhexidine Gluconate Cloth  6 each Topical Daily  . dorzolamide  1 drop Both Eyes BID  . insulin aspart  0-15 Units Subcutaneous Q4H  . insulin aspart  4 Units Subcutaneous Q4H  . insulin glargine  20 Units Subcutaneous Daily  . latanoprost  1 drop Both Eyes QHS  . mouth rinse  15 mL Mouth Rinse 10 times per  day  . methylPREDNISolone (SOLU-MEDROL) injection  40 mg Intravenous Q12H  . midodrine  10 mg Per Tube BID WC  . multivitamin with minerals  1 tablet Per Tube Daily  . pantoprazole (PROTONIX) IV  40 mg Intravenous Q12H  . polyethylene glycol  17 g Per Tube Daily  . rocuronium  52 mg Intravenous Once  . senna-docusate  2 tablet Per Tube BID  . sodium chloride flush  10-40 mL Intracatheter Q12H  . sodium chloride flush  3 mL Intravenous Q12H  . sodium chloride flush  3 mL Intravenous Q12H  . timolol  1 drop Both Eyes BID  . voriconazole  200 mg Per Tube Q12H  . voriconazole  400 mg Per Tube Q12H  . white petrolatum   Topical BID    Continuous Infusions: . sodium chloride 10 mL/hr at 02/23/20 0104  . ceFEPime (MAXIPIME) IV Stopped (02/26/20 0531)  . dexmedetomidine (PRECEDEX) IV infusion 0.2 mcg/kg/hr (02/26/20 0900)  . feeding supplement (VITAL AF 1.2 CAL) 1,000 mL (02/26/20 0500)  . fentaNYL infusion INTRAVENOUS 100 mcg/hr (02/26/20 0900)  . heparin 900 Units/hr (02/26/20 0900)  . metronidazole 500 mg (02/26/20 0953)  . norepinephrine (LEVOPHED) Adult infusion Stopped (02/26/20 0824)    PRN Meds: sodium chloride, acetaminophen **OR** acetaminophen, docusate, fentaNYL, fentaNYL (SUBLIMAZE) injection, fentaNYL (SUBLIMAZE) injection, midazolam, midazolam, ondansetron **OR** ondansetron (ZOFRAN) IV, sodium chloride flush, sodium chloride flush  Vital  Signs: BP (!) 158/72   Pulse 78   Temp 97.7 F (36.5 C) (Axillary)   Resp (!) 22   Ht _0  (1.575 m)   Wt 59 kg   SpO2 90%   BMI 23.79 kg/m  SpO2: SpO2: 90 % O2 Device: O2 Device: Ventilator O2 Flow Rate: O2 Flow Rate (L/min): 15 L/min   Patient awake and able to follow simple commands.  Intake/output summary:   Intake/Output Summary (Last 24 hours) at 02/26/2020 0953 Last data filed at 02/26/2020 0900 Gross per 24 hour  Intake 2958.05 ml  Output 760 ml  Net 2198.05 ml   LBM: Last BM Date: 02/20/20 Baseline Weight: Weight: 67.2 kg Most recent weight: Weight: 59 kg       Palliative Assessment/Data: 10%      Patient Active Problem List   Diagnosis Date Noted  . Goals of care, counseling/discussion   . Palliative care by specialist   . Pressure injury of skin 02/19/2020  . Acute GI bleeding 02/15/2020  . Pneumonia with cavity of lung 02/15/2020  . Hyponatremia 02/15/2020  . Hyperglycemia 02/15/2020  . Symptomatic anemia   . Vision loss of right eye   . Pulmonary embolism ([place]) 01/28/2020  . Pneumonia due to COVID-19 virus 01/27/2020  . Acute respiratory failure with hypoxia ([place]) 01/27/2020  . Essential hypertension 01/27/2020  . DM (diabetes mellitus), type 2 ([place]) 01/27/2020    Palliative Care Plan    Recommendations/Plan:  PMT will continue to follow with you as an extra layer for support for family.  Bruce Webb is in support of Bronchoscopy if that would be beneficial in narrowing a diagnosis and treatment plan.  As of last weekend family requested full code full scope.  Would like to have a true conversation with family (wife, daughters, and Bruce Webb) regarding patient's values and what quality of life would be acceptable to him.   Goals of Care and Additional Recommendations:  Limitations on Scope of Treatment: Full Scope Treatment  Code Status:  Full code  Prognosis:   Unable to determine   Discharge  Planning:  To Be Determined  Care  plan was discussed with Dr. Loanne Webb  Thank you for allowing the Palliative Medicine Team to assist in the care of this patient.  Total time spent:  35 min     Greater than 50%  of this time was spent counseling and coordinating care related to the above assessment and plan.  Bruce Jenny, PA-C Palliative Medicine  Please contact Palliative MedicineTeam phone at [phone] for questions and concerns between 7 am - 7 pm.   Please see AMION for individual provider pager numbers.

## 2020-02-26 NOTE — Progress Notes (Signed)
PT Cancellation Note  Patient Details Name: Bruce Webb MRN: [id number] DOB: 1941/02/26   Cancelled Treatment:    Reason Eval/Treat Not Completed: Medical issues which prohibited therapy(Pt not following commands per nurse with VS unstable at rest). Nurse asked to HOLD today.  Also pt is a r/o TB.  Will check back tomorrow.    Berline Lopes 02/26/2020, 10:51 AM Dwyane Dupree W,PT Acute Rehabilitation Services Pager:  [phone]  Office:  [phone]

## 2020-02-26 NOTE — Progress Notes (Signed)
Nutrition Follow-up   DOCUMENTATION CODES:   Not applicable  INTERVENTION:    Vital AF 1.2 at 60 ml/h x 20 hours per day (1200 ml)   Provides 1440 kcal, 90 gm protein, 973 ml free water daily  NUTRITION DIAGNOSIS:   Inadequate oral intake related to altered GI function as evidenced by NPO status.  Ongoing   GOAL:   Patient will meet greater than or equal to 90% of their needs  Met with TF.  MONITOR:   Vent status, TF tolerance, Labs  REASON FOR ASSESSMENT:   Consult Enteral/tube feeding initiation and management  ASSESSMENT:   79 yo male admitted with exertional dyspnea, anemia, hemoccult positive stools. PMH includes COVID and PE in January 2021, HTN, HF, DM-2, glaucoma.  Discussed patient in ICU rounds and with RN today. TF was initiated 3/1; currently receiving Vital AF 1.2 at 50 ml/h. Patient is receiving a medication that requires TF to be held for 1 hour before and 1 hour after administration. Voriconazole to be given BID at 10 am and 10 pm. Will order TF to be held 9-11 am and 9-11 pm. Will increase TF rate to 60 ml/h to be infused for a total of 20 hours per day.   Palliative Care team is following.   Patient remains intubated on ventilator support. MV: 24.6 L/min Temp (24hrs), Avg:97.7 F (36.5 C), Min:97 F (36.1 C), Max:98.4 F (36.9 C)  Propofol: off  Labs reviewed. Na 129 (L) CBG's: [phone]  Medications reviewed and include lasix, novolog, lantus, solumedrol, MVI, miralax, senokot-s, voriconazole.  I/O +10.7 L since admission   Diet Order:   Diet Order            Diet NPO time specified  Diet effective ____              EDUCATION NEEDS:   Not appropriate for education at this time  Skin:  Skin Assessment: Skin Integrity Issues: Skin Integrity Issues:: Stage II Stage II: nose and ears  Last BM:  2/26  Height:   Ht Readings from Last 1 Encounters:  02/24/20 '5\' 2"'$  (1.575 m)    Weight:   Wt Readings from Last 1  Encounters:  02/26/20 59 kg    Ideal Body Weight:  53.6 kg  BMI:  Body mass index is 23.79 kg/m.  Estimated Nutritional Needs:   Kcal:  1485  Protein:  80-95 grams  Fluid:  > 1.6 L    Molli Barrows, RD, LDN, CNSC Please refer to Amion for contact information.

## 2020-02-26 NOTE — Progress Notes (Signed)
ANTICOAGULATION CONSULT NOTE  Pharmacy Consult for Heparin Indication: Small subacute PE  No Known Allergies  Patient Measurements: Height: 5\' 2"  (157.5 cm) Weight: 130 lb 1.1 oz (59 kg) IBW/kg (Calculated) : 54.6 Heparin Dosing Weight: 52kg  Vital Signs: Temp: 97.6 F (36.4 C) (03/04 1156) Temp Source: Axillary (03/04 1156) BP: 80/44 (03/04 1800) Pulse Rate: 69 (03/04 1830)  Labs: Recent Labs    02/24/20 0657 02/24/20 1220 02/25/20 0428 02/25/20 0933 02/25/20 1546 02/25/20 1800 02/26/20 0413 02/26/20 0413 02/26/20 0440 02/26/20 1225 02/26/20 1814  HGB 7.9*   < > 7.9*  7.8*   < >   < >  --  15.0   < > 7.9* 10.9*  --   HCT 25.8*   < > 25.4*  23.0*   < >   < >  --  44.0  --  24.4* 32.0*  --   PLT 105*  --  126*  --   --   --   --   --  156  --   --   APTT 136*  --  79*  --   --   --   --   --  91*  --   --   HEPARINUNFRC 0.82*   < > 0.55  --   --  0.40  --   --  0.57  --  0.48  CREATININE 1.06  --  1.59*  --   --   --   --   --  1.50*  --   --    < > = values in this interval not displayed.    Estimated Creatinine Clearance: 31.3 mL/min (A) (by C-G formula based on SCr of 1.5 mg/dL (H)).   Assessment: 79 y.o. male with 01/27/20 small subacute PE, PTA Eliquis on hold, last dose 2/21. Started on heparin with low goal for H/H drop requiring transfusion on admit, stable H/H and platelets x5 days. Switched back to Apixaban 2/26 >> 2/27, back to IV heparin on 2/28 due to intubation.   Heparin level is therapeutic and toward the high end of normal; no bleeding reported.  Goal of Therapy:  Hep level 0.3 - 0.5 units/mL Monitor platelets by anticoagulation protocol: Yes   Plan:  Reduce heparin gtt slightly to 700 units/hr  Daily heparin level and CBC  Aniko Finnigan D. 3/28, PharmD, BCPS, BCCCP 02/26/2020, 7:00 PM

## 2020-02-26 NOTE — Progress Notes (Signed)
NAME:  Bruce Webb, MRN:  [id number], DOB:  1941/01/06, LOS: 11 ADMISSION DATE:  02/15/2020, CONSULTATION DATE:  02/19/2020 REFERRING MD:  Dr. Karleen Hampshire, CHIEF COMPLAINT:  Hypotension/ hypoxia    Brief History   79 year old male post COVID and PE in January on Eliquis admitted 2/21 to [facility] for extertional dyspnea found to be anemic with hemoccult positive stools.  GI consulted and transfused PRBC with since stabilized Hgb but has had persistent higher O2 requirements.  Was diuresed but today developed hypotension, with ongoing higher O2 requirements, PCCM was asked to consult.     History of present illness   HPI obtained via interpreter and from daughter.   79 year old male, who speaks [place], with history of HTN, HFpEF, DMT2, glaucoma with recent COVID 01/16/2020 with subsequent subacute PE on Eliquis and home O2 2L [place] who presented 2/21 with exertional dyspnea, black tarry stools, and worsening vision loss in his right eye.  Patient lives at home with his daughter and pre-COVID was independent in his ADLs.  He is a retired and prior worked in an office.  Denies any smoking or pulmonary history.  Reports prior to COVID, had a chronic non-productive cough for 10-15 years and his primary care MD treated it occasionally with cough medicine.   Patient with recent hospitalization at [facility] for [place] after initial positive 1/22 for failed outpatient treatment for COVID infection despite monoclonal antibodies and steroids.  While at [facility], treated with remdesivir and decadron and found to have a PE and was discharged home on 2/6 on 2L O2 and Eliquis.   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    On admit, found to have worsening anemia, 8.8-> 6 with positive fecal occult blood testing and hypotension responsive to fluid.  Additionally found to have WBC 28k, CXR with worsening multifocal airspace consolidation bilaterally.  He was started  empirically on zithromax and cefepime. PCT 0.5.  He was transfused with 2 units PRBC, started on protonix, and GI consulted. However, given tenuous respiratory status and increased O2 needs, GI deferred EGD.  Hgb trend since stable and therefore placed on heparin gtt on 2/23.  He was diuresed on 2/23 given fluid overload post blood transfusion with good output then but has since required midodrine to help with soft blood pressures.  On 2/25, he had worsening hypotension despite midodrine, ongoing elevated O2 needs, and transaminitis, PCCM asked to consult.     Past Medical History  HTN, HFpEF, DMT2, glaucoma with progressive vision loss (left, now rapid loss in right) - recent COVID 01/16/2020 with subsequent PE on Eliquis and O2 2L [place]  Significant Hospital Events   2/21 admit. Stool ob =negatoive  2/23- echo ef 65%  2/25 PCCM consulted for hypotension and persistent hypoxemia  2/26 - Has severe desaturations with activity and eating. Required 10L while eating his meal today. No acute distress but continues to have shortness of breath. Denies chest pain.   2/27 -  -remains on 10 L.  He easily desaturates.  [name] at the bedside.  I spoke to her and subsequently also to their family friend who is in physician assistant in cardiology [place]).  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  According to the daughter he got worse with COVID-19 then got better went home but he declined again in terms of his hypoxemia.  I personally visualized the current  CT scan of the chest compared to 4 weeks ago and to my visualization it looks like an extension of his pulmonary infiltrates from the original COVID-19 admission including a right right upper lobe anterior segment cavity thick-walled.   Consults:  GI  PCCM  Procedures:   Significant Diagnostic Tests:  2/21 CTH >> 1. No acute intracranial abnormalities. 2. Mild cerebral atrophy with chronic  microvascular ischemic changes in the cerebral white matter, as above.  CTA 01/27/20 - Diffuse subpleural/peripheral interstitial ground glass opacities with increased involvement in the the lower lobes bilaterally R>L. CXR 02/16/20 R>L multifocal pneumonia R>L, no edema or effusion  2/23 TTE >> 1. Left ventricular ejection fraction, by estimation, is 60 to 65%. The left ventricle has normal function. The left ventricle has no regional wall motion abnormalities. Left ventricular diastolic parameters are consistent with Grade II diastolic dysfunction (pseudonormalization).  2. The history indicates the patient has a pulmonary embolus. The RV is  not dilated and there is normal RV function. . Right ventricular systolic function is low normal. The right ventricular size is normal.  3. The mitral valve is normal in structure and function. Mild mitral valve regurgitation. No evidence of mitral stenosis.  4. The aortic valve is normal in structure and function. Aortic valve regurgitation is not visualized. No aortic stenosis is present.   Micro Data:  2/21 BCx 2 >> ngtd x 4 days 2/25 - CCP 2/27 - quant gold -  2/27 RVP - negative 2/28  - urine strep - negative 228 - urine leg -  2/28 - aspergillus Ab -  xxxxxxxxxxxx 3/1 BAL Cx, fungal, acid fast >> 3/1 BAL PCP>>  Antimicrobials:  2/21 azithro>>2/26 2/21 ceftriaxone  2/22 cefepime >> 2/28 Bactrim > 3/1 Flagyl>  Interim history/subjective:  Stable oxygenation onf 50-60% FIO2. Will be agitated on sedation weaning.  Objective   Blood pressure (!) 180/84, pulse 76, temperature 97.7 F (36.5 C), temperature source Axillary, resp. rate 17, height _0  (1.575 m), weight 59 kg, SpO2 91 %.    Vent Mode: PCV FiO2 (%):  [50 %-60 %] 50 % Set Rate:  [28 bmp] 28 bmp PEEP:  [10 cmH20] 10 cmH20 Plateau Pressure:  [25 cmH20] 25 cmH20   Intake/Output Summary (Last 24 hours) at 02/26/2020 1103 Last data filed at 02/26/2020 1000 Gross per 24 hour    Intake 2839.84 ml  Output 760 ml  Net 2079.84 ml   Filed Weights   02/24/20 0500 02/25/20 0407 02/26/20 0429  Weight: 54.5 kg 56.9 kg 59 kg   Physical Exam: General: Chronically ill-appearing, no acute distress HENT: [place], AT, ETT in place Eyes: EOMI, no scleral icterus Respiratory: Clear to auscultation bilaterally.  No crackles, wheezing or rales Cardiovascular: RRR, -M/R/G, no JVD GI: BS+, soft, nontender Extremities: Anasarca,-tenderness Neuro: Opens eyes to voice, grimaces to pain, moves extremities x 4, does not follow commands GU: Foley in place  Resolved Hospital Problem list   Transaminitis -resolved.worsening likely in setting of shock. RUQ unrevealing  Assessment & Plan:  79 year old male never smoker with prior COVID-19 infection diagnosed in January and requiring hospitalization from 2/1-2/6, recently diagnosed pulmonary emboli on anticoagulation and end-stage glaucoma with right vision loss in addition to his known left vision loss who presents with worsening shortness of breath and admitted for acute on chronic respiratory failure secondary to multifocal pneumonia. Has had required high O2 requirements during the course of this hospitalization despite steroid therapy and transferred to the ICU on 2/28.  ARDS/Acute hypoxemic respiratory failure secondary to cavitary pneumonia. Likely multifactorial with bacterial/necrotizing pneumonia in setting of post-inflammatory COVID-19 infection vs pulmonary fibrosis, atelectasis, pulmonary emboli. Low suspicion for TB even though he is from an endemic area; PPD neg and immigrated >10 years. BAL cell count significant for neutrophilic predominance suggesting bacterial infection. Autoimmune work-up neg. Quantiferon gold pending. Aspergillus ab positive --Full vent support --Wean FIO2/PEEP for goal SpO2 88-95% or pO2 55-80 --ABG PRN --Appreciate ID input. Start Vori for + aspergillus ag. Continue Cefepime and Flagyl. Bactrim DC for  AKI --F/u cultures including BAL --F/u PCP --Continue BID solumedrol --Gentle diuresis today  Agitation requiring sedation --Start oxycodone 5 mg q8h --PAD protocol: Wean fentanyl and precedex for goal RASS -1  Septic shock secondary to bacterial pneumonia. LA wnl --Continue antibiotics per ID --Wean levo gtt for MAP goal >65. Off this morning. --F/u cultures  AKI S/p lasix 3/2 and 3/4 --Gentle diuresis --Monitor UOP/Cr  Right pulmonary emboli --Continue heparin gtt --Trend CBC with hx of melena prior to admission  Acute blood loss anemia, melena. S/p 2U PRBC. Hg stable with no further transfusions --GI consulted but unable to perform EGD due to respiratory status --Trend CBC --Decrease PPI to daily  Hyponatremia --Trend BMP daily  Hyperglycemia --SSI --Increase Lantus  Chronic diastolic heart failure --Gentle diuresis  Acute urinary retention 2/27 --Foley in place  GOC --Guarded prognosis in setting of respiratory failure in patient with baseline lung function since his COVID infection.  --Appreciate palliative consult.  Best practice:  Diet: TF Pain/Anxiety/Delirium protocol (if indicated): yes VAP protocol (if indicated): yes DVT prophylaxis: heparin gtt (stop eliquis) GI prophylaxis: PPI bid dosing Glucose control: SSI Mobility: push  Code Status: Full  Family Communication:d.w Rowe Pavy the PA in cards who his husband ofpatient's distance niece and is family spokesperson due to medical knowledge.   Called patient's distant nephew on 3/3. Will call today.  Disposition: ICU  SIGNATURE   The patient is critically ill with multiple organ systems failure and requires high complexity decision making for assessment and support, frequent evaluation and titration of therapies, application of advanced monitoring technologies and extensive interpretation of multiple databases.   Critical Care Time devoted to patient care services described in this note is  34 Minutes.   Rodman Pickle, M.D. [facility] Pulmonary/Critical Care Medicine 02/26/2020 11:03 AM   Please see Amion for pager number to reach on-call Pulmonary and Critical Care Team.   LABS    PULMONARY Recent Labs  Lab 02/24/20 2050 02/25/20 0428 02/25/20 0933 02/25/20 1546 02/26/20 0413  PHART 7.334* 7.327* 7.317* 7.382 7.372  PCO2ART 37.5 41.0 39.7 32.1 32.6  PO2ART 77.0* 87.0 80.0* 54.0* 61.0*  HCO3 19.9* 21.5 20.4 19.1* 19.1*  TCO2 21* 23 22 20* 20*  O2SAT 94.0 96.0 95.0 88.0 91.0    CBC Recent Labs  Lab 02/24/20 0657 02/24/20 1220 02/25/20 0428 02/25/20 0933 02/25/20 1546 02/26/20 0413 02/26/20 0440  HGB 7.9*   < > 7.9*  7.8*   < > 8.5* 15.0 7.9*  HCT 25.8*   < > 25.4*  23.0*   < > 25.0* 44.0 24.4*  WBC 34.7*  --  38.4*  --   --   --  40.5*  PLT 105*  --  126*  --   --   --  156   < > = values in this interval not displayed.    COAGULATION Recent Labs  Lab 02/19/20 1635  INR 1.2    CARDIAC  No  results for input(s): TROPONINI in the last 168 hours. No results for input(s): PROBNP in the last 168 hours.   CHEMISTRY Recent Labs  Lab 02/20/20 1045 02/22/20 0352 02/23/20 0420 02/23/20 1441 02/24/20 0301 02/24/20 0556 02/24/20 0657 02/24/20 1220 02/25/20 0428 02/25/20 0428 02/25/20 0933 02/25/20 0933 02/25/20 1546 02/25/20 1546 02/26/20 0413 02/26/20 0440  NA 137   < > 137   < > 149*   < > 129*   < > 128*  130*  --  127*  --  128*  --  131* 129*  K 4.6   < > 4.4   < > 4.3   < > 4.8   < > 4.3  4.3   < > 4.7   < > 4.7   < > 4.7 5.1  CL 103   < > 103  --  116*  --  100  --  99  --   --   --   --   --   --  102  CO2 26   < > 27  --  23  --  22  --  20*  --   --   --   --   --   --  18*  GLUCOSE 180*   < > 107*  --  206*  --  391*  --  224*  --   --   --   --   --   --  187*  BUN 30*   < > 37*  --  110*  --  39*  --  54*  --   --   --   --   --   --  63*  CREATININE 1.10   < > 0.85  --  1.53*  --  1.06  --  1.59*  --   --   --   --   --    --  1.50*  CALCIUM 8.1*   < > 7.5*  --  9.2  --  7.2*  --  7.2*  --   --   --   --   --   --  7.2*  MG 2.0  --   --   --   --   --   --   --   --   --   --   --   --   --   --   --    < > = values in this interval not displayed.   Estimated Creatinine Clearance: 31.3 mL/min (A) (by C-G formula based on SCr of 1.5 mg/dL (H)).   LIVER Recent Labs  Lab 02/19/20 1635 02/21/20 1205 02/22/20 0352 02/23/20 0420 02/24/20 0301 02/25/20 0428 02/26/20 0440  AST  --    < > 98* 35 87* 22 26  ALT  --    < > 124* 78* 102* 40 32  ALKPHOS  --    < > 171* 131* 239* 138* 123  BILITOT  --    < > 0.5 0.5 0.5 0.4 0.5  PROT  --    < > 5.6* 5.0* 8.6* 5.0* 4.9*  ALBUMIN  --    < > 1.5* 1.4* 1.9* 1.3* 1.3*  INR 1.2  --   --   --   --   --   --    < > = values in this interval not displayed.     INFECTIOUS Recent Labs  Lab 02/19/20 1635 02/19/20 1635 02/23/20  9935 02/25/20 1315 02/25/20 1644  LATICACIDVEN 1.9   < > 1.5 1.6 1.8  PROCALCITON 0.99  --   --   --   --    < > = values in this interval not displayed.     ENDOCRINE CBG (last 3)  Recent Labs    02/25/20 2336 02/26/20 0339 02/26/20 0824  GLUCAP 181* 161* 205*         IMAGING x48h  - image(s) personally visualized  -   highlighted in bold DG Chest Port 1 View  Result Date: 02/25/2020 CLINICAL DATA:  COVID pneumonia. EXAM: PORTABLE CHEST 1 VIEW COMPARISON:  Chest x-ray 02/22/2020 FINDINGS: The endotracheal tube is 5 cm above the carina. The NG tube is coursing down the esophagus and into the stomach. The right PICC line tip is in good position with its tip near the cavoatrial junction. The cardiac silhouette, mediastinal and hilar contours are within normal limits and stable. Persistent bilateral patchy interstitial and airspace infiltrates consistent with COVID pneumonia. No new or progressive findings. No pleural effusions. IMPRESSION: 1. Stable support apparatus.  New right PICC line in good position. 2. Persistent bilateral  interstitial and airspace infiltrates. Electronically Signed   By: Marijo Sanes M.D.   On: 02/25/2020 11:59

## 2020-02-26 NOTE — Progress Notes (Signed)
Attempted to use stratus to translate. Pt was awake and shaking his "no" but did not follow any commands when translated.

## 2020-02-27 DIAGNOSIS — Z515 Encounter for palliative care: Secondary | ICD-10-CM

## 2020-02-27 LAB — CBC
HCT: 25.2 % — ABNORMAL LOW (ref 39.0–52.0)
Hemoglobin: 7.9 g/dL — ABNORMAL LOW (ref 13.0–17.0)
MCH: 27.7 pg (ref 26.0–34.0)
MCHC: 31.3 g/dL (ref 30.0–36.0)
MCV: 88.4 fL (ref 80.0–100.0)
Platelets: 243 10*3/uL (ref 150–400)
RBC: 2.85 MIL/uL — ABNORMAL LOW (ref 4.22–5.81)
RDW: 18.2 % — ABNORMAL HIGH (ref 11.5–15.5)
WBC: 43.5 10*3/uL — ABNORMAL HIGH (ref 4.0–10.5)
nRBC: 0.3 % — ABNORMAL HIGH (ref 0.0–0.2)

## 2020-02-27 LAB — COMPREHENSIVE METABOLIC PANEL
ALT: 31 U/L (ref 0–44)
AST: 35 U/L (ref 15–41)
Albumin: 1.4 g/dL — ABNORMAL LOW (ref 3.5–5.0)
Alkaline Phosphatase: 111 U/L (ref 38–126)
Anion gap: 10 (ref 5–15)
BUN: 75 mg/dL — ABNORMAL HIGH (ref 8–23)
CO2: 19 mmol/L — ABNORMAL LOW (ref 22–32)
Calcium: 7.5 mg/dL — ABNORMAL LOW (ref 8.9–10.3)
Chloride: 102 mmol/L (ref 98–111)
Creatinine, Ser: 1.5 mg/dL — ABNORMAL HIGH (ref 0.61–1.24)
GFR calc Af Amer: 51 mL/min — ABNORMAL LOW (ref >60)
GFR calc non Af Amer: 44 mL/min — ABNORMAL LOW (ref >60)
Glucose, Bld: 172 mg/dL — ABNORMAL HIGH (ref 70–99)
Potassium: 5.4 mmol/L — ABNORMAL HIGH (ref 3.5–5.1)
Sodium: 131 mmol/L — ABNORMAL LOW (ref 135–145)
Total Bilirubin: 0.5 mg/dL (ref 0.3–1.2)
Total Protein: 5.1 g/dL — ABNORMAL LOW (ref 6.5–8.1)

## 2020-02-27 LAB — BASIC METABOLIC PANEL
Anion gap: 7 (ref 5–15)
BUN: 81 mg/dL — ABNORMAL HIGH (ref 8–23)
CO2: 19 mmol/L — ABNORMAL LOW (ref 22–32)
Calcium: 7.4 mg/dL — ABNORMAL LOW (ref 8.9–10.3)
Chloride: 105 mmol/L (ref 98–111)
Creatinine, Ser: 1.58 mg/dL — ABNORMAL HIGH (ref 0.61–1.24)
GFR calc Af Amer: 48 mL/min — ABNORMAL LOW (ref >60)
GFR calc non Af Amer: 41 mL/min — ABNORMAL LOW (ref >60)
Glucose, Bld: 149 mg/dL — ABNORMAL HIGH (ref 70–99)
Potassium: 4.9 mmol/L (ref 3.5–5.1)
Sodium: 131 mmol/L — ABNORMAL LOW (ref 135–145)

## 2020-02-27 LAB — GLUCOSE, CAPILLARY
Glucose-Capillary: 129 mg/dL — ABNORMAL HIGH (ref 70–99)
Glucose-Capillary: 249 mg/dL — ABNORMAL HIGH (ref 70–99)
Glucose-Capillary: 211 mg/dL — ABNORMAL HIGH (ref 70–99)
Glucose-Capillary: 174 mg/dL — ABNORMAL HIGH (ref 70–99)
Glucose-Capillary: 169 mg/dL — ABNORMAL HIGH (ref 70–99)

## 2020-02-27 LAB — POCT I-STAT 7, (LYTES, BLD GAS, ICA,H+H)
Acid-base deficit: 8 mmol/L — ABNORMAL HIGH (ref 0.0–2.0)
Bicarbonate: 17.5 mmol/L — ABNORMAL LOW (ref 20.0–28.0)
Calcium, Ion: 1.11 mmol/L — ABNORMAL LOW (ref 1.15–1.40)
HCT: 23 % — ABNORMAL LOW (ref 39.0–52.0)
Hemoglobin: 7.8 g/dL — ABNORMAL LOW (ref 13.0–17.0)
O2 Saturation: 88 %
Patient temperature: 97.7
Potassium: 5.3 mmol/L — ABNORMAL HIGH (ref 3.5–5.1)
Sodium: 130 mmol/L — ABNORMAL LOW (ref 135–145)
TCO2: 19 mmol/L — ABNORMAL LOW (ref 22–32)
pCO2 arterial: 34.1 mmHg (ref 32.0–48.0)
pH, Arterial: 7.315 — ABNORMAL LOW (ref 7.350–7.450)
pO2, Arterial: 57 mmHg — ABNORMAL LOW (ref 83.0–108.0)

## 2020-02-27 LAB — ACID FAST SMEAR (AFB, MYCOBACTERIA): Acid Fast Smear: NEGATIVE

## 2020-02-27 LAB — PNEUMOCYSTIS JIROVECI SMEAR BY DFA: Pneumocystis jiroveci Ag: NEGATIVE

## 2020-02-27 LAB — APTT: aPTT: 60 s — ABNORMAL HIGH (ref 24–36)

## 2020-02-27 LAB — HEPARIN LEVEL (UNFRACTIONATED): Heparin Unfractionated: 0.49 [IU]/mL (ref 0.30–0.70)

## 2020-02-27 MED ORDER — METHYLPREDNISOLONE SODIUM SUCC 40 MG IJ SOLR
40.0000 mg | Freq: Two times a day (BID) | INTRAMUSCULAR | Status: DC
  Administered 2020-02-28: 40 mg via INTRAVENOUS
  Filled 2020-02-27 (×2): qty 1

## 2020-02-27 MED ORDER — SODIUM CHLORIDE 0.9 % IV SOLN
3.0000 g | Freq: Three times a day (TID) | INTRAVENOUS | Status: AC
  Administered 2020-02-27 – 2020-03-05 (×21): 3 g via INTRAVENOUS
  Filled 2020-02-27 (×22): qty 8

## 2020-02-27 MED ORDER — FUROSEMIDE 10 MG/ML IJ SOLN
40.0000 mg | Freq: Once | INTRAMUSCULAR | Status: AC
  Administered 2020-02-27: 40 mg via INTRAVENOUS
  Filled 2020-02-27: qty 4

## 2020-02-27 NOTE — Consult Note (Signed)
WOC Nurse Consult Note: Reason for Consult: MDRPI has healed (nares and nose) Wound type: Pressure Pressure Injury POA: No Measurement: N/A Wound bed:N/A Drainage (amount, consistency, odor) None Periwound:intact Dressing procedure/placement/frequency: Dried blood and any areas that were present last week have healed.  WOC nursing team will not follow, but will remain available to this patient, the nursing and medical teams.  Please re-consult if needed. Thanks, Ladona Mow, MSN, RN, GNP, [place]  Pager# [phone]

## 2020-02-27 NOTE — Progress Notes (Signed)
[facility] for Infectious Disease    Date of Admission:  02/15/2020   Total days of antibiotics 12           ID: Bruce Webb is a 79 y.o. male with  Hx of covid, tx with steroids and toci, readmitted for respiratory distress found to have cavitary pneumonia Principal Problem:   Acute GI bleeding Active Problems:   Essential hypertension   Pulmonary embolism ([place])   Pneumonia with cavity of lung   Hyponatremia   Hyperglycemia   Pressure injury of skin   Goals of care, counseling/discussion   Palliative care by specialist   Multifocal pneumonia    Subjective: Remains afebrile, intubated, FIO2 at 50%.   Micro shows e.faecalis on recent sputum culture  Medications:  . brimonidine  1 drop Both Eyes BID  . budesonide (PULMICORT) nebulizer solution  0.5 mg Nebulization BID  . chlorhexidine gluconate (MEDLINE KIT)  15 mL Mouth Rinse BID  . Chlorhexidine Gluconate Cloth  6 each Topical Daily  . dorzolamide  1 drop Both Eyes BID  . insulin aspart  0-15 Units Subcutaneous Q4H  . insulin aspart  6 Units Subcutaneous Q4H  . insulin glargine  20 Units Subcutaneous Daily  . latanoprost  1 drop Both Eyes QHS  . linezolid  600 mg Per Tube Q12H  . mouth rinse  15 mL Mouth Rinse 10 times per day  . [START ON 02/28/2020] methylPREDNISolone (SOLU-MEDROL) injection  40 mg Intravenous Q12H  . midodrine  10 mg Per Tube BID WC  . multivitamin with minerals  1 tablet Per Tube Daily  . oxyCODONE  5 mg Per Tube Q8H  . pantoprazole (PROTONIX) IV  40 mg Intravenous Q24H  . polyethylene glycol  17 g Per Tube Daily  . rocuronium  52 mg Intravenous Once  . senna-docusate  2 tablet Per Tube BID  . sodium chloride flush  10-40 mL Intracatheter Q12H  . sodium chloride flush  3 mL Intravenous Q12H  . sodium chloride flush  3 mL Intravenous Q12H  . timolol  1 drop Both Eyes BID  . voriconazole  200 mg Per Tube Q12H  . white petrolatum   Topical BID    Objective: Vital signs in last 24  hours: Temp:  [97.7 F (36.5 C)-98.7 F (37.1 C)] 97.8 F (36.6 C) (03/05 1200) Pulse Rate:  [53-81] 69 (03/05 1415) Resp:  [11-37] 22 (03/05 1415) BP: (76-160)/(36-78) 92/43 (03/05 1126) SpO2:  [87 %-100 %] 99 % (03/05 1415) Arterial Line BP: (76-156)/(36-69) 139/53 (03/05 1415) FiO2 (%):  [50 %] 50 % (03/05 1200) Weight:  [60 kg] 60 kg (03/05 0408)  gen = appears chronically ill, intubated  Lab Results Recent Labs    02/26/20 0440 02/26/20 1225 02/27/20 0425 02/27/20 0820 02/27/20 1000  WBC 40.5*  --   --   --  43.5*  HGB 7.9*   < >  --  7.8* 7.9*  HCT 24.4*   < >  --  23.0* 25.2*  NA 129*   < > 131* 130*  --   K 5.1   < > 5.4* 5.3*  --   CL 102  --  102  --   --   CO2 18*  --  19*  --   --   BUN 63*  --  75*  --   --   CREATININE 1.50*  --  1.50*  --   --    < > = values in  this interval not displayed.   Liver Panel Recent Labs    02/26/20 0440 02/27/20 0425  PROT 4.9* 5.1*  ALBUMIN 1.3* 1.4*  AST 26 35  ALT 32 31  ALKPHOS 123 111  BILITOT 0.5 0.5    Microbiology: Efaecalis, and yeast on aspirate cx  Studies/Results: No results found.   Assessment/Plan: Cavitary pneumonia = we have limited data. BAL didn't isolate any pathogen on 2/28, repeat trach aspirate showing e.faecalis. Will switch from cefepime/metro -> to amp.sub  Continue on voriconazole for now - will do 7 day course to see if anything grows on his BAL and other sputum culture, but may continue if more evidence for fungal infection  Can stop airborne isolation tomorrow, if AFB is negative from 3/4  [facility] for Infectious Diseases Cell: [phone] Pager: [phone]  02/27/2020, 2:25 PM

## 2020-02-27 NOTE — Progress Notes (Signed)
ANTICOAGULATION CONSULT NOTE  Pharmacy Consult for Heparin Indication: Small subacute PE  No Known Allergies  Patient Measurements: Height: 5\' 2"  (157.5 cm) Weight: 132 lb 4.4 oz (60 kg) IBW/kg (Calculated) : 54.6 Heparin Dosing Weight: 52kg  Vital Signs: Temp: 98.7 F (37.1 C) (03/05 0800) Temp Source: Rectal (03/05 0800) BP: 122/60 (03/05 1000) Pulse Rate: 61 (03/05 1115)  Labs: Recent Labs    02/25/20 0428 02/25/20 0933 02/26/20 0440 02/26/20 0440 02/26/20 1225 02/26/20 1814 02/27/20 0425 02/27/20 0820  HGB 7.9*  7.8*   < > 7.9*   < > 10.9*  --   --  7.8*  HCT 25.4*  23.0*   < > 24.4*  --  32.0*  --   --  23.0*  PLT 126*  --  156  --   --   --   --   --   APTT 79*  --  91*  --   --   --  60*  --   HEPARINUNFRC 0.55   < > 0.57  --   --  0.48 0.49  --   CREATININE 1.59*  --  1.50*  --   --   --  1.50*  --    < > = values in this interval not displayed.    Estimated Creatinine Clearance: 31.3 mL/min (A) (by C-G formula based on SCr of 1.5 mg/dL (H)).   Assessment: 79 y.o. male with 01/27/20 small subacute PE, PTA Eliquis on hold, last dose 2/21. Started on heparin with low goal for H/H drop requiring transfusion on admit, stable H/H and platelets x5 days. Switched back to Apixaban 2/26 >> 2/27, back to IV heparin on 2/28 due to intubation.   Heparin level is therapeutic and toward the high end of normal; no bleeding reported.  Goal of Therapy:  Hep level 0.3 - 0.5 units/mL Monitor platelets by anticoagulation protocol: Yes   Plan:  Continue heparin gtt 700 units/hr  Daily heparin level and CBC  3/28, PharmD, BCPS, BCCCP Clinical Pharmacist [phone]  Please check AMION for all [facility] Pharmacy numbers  02/27/2020 11:32 AM

## 2020-02-27 NOTE — Progress Notes (Addendum)
Physical Therapy Re-evaluation Patient Details Name: Bruce Webb MRN: [id number] DOB: Oct 18, 1941 Today's Date: 02/27/2020    History of Present Illness 79 y.o. gentleman prior history of essential hypertension, type 2 diabetes mellitus, recent COVID-19 illness initially diagnosed on 01/16/2020. Patient was discharged from hospital on recent discharge from the hospital on 01/31/2020 on 2L of nasal cannula oxygen and had been found to have pulmonary embolism. 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 He presented to the emergency department with exertional dyspnea, melena, and progressive vision loss involving the right eye. Chest x-ray of 2/22: asymmetric airspace disease, right greater than left. WBC increased on 2/24; afebrile.  Intubated 2/28 due to decline in medical status.  Also now he is r/o TB as well.  On airborne precautions.     PT Comments    Pt admitted with above diagnosis. Pt has some spontaneous movement of all 4 extremities but does not follow commands for any movement. Resistance at shoulders and elbows with P/ROM.  Pt with response to nail bed pressure but not response to blink to threat or to temperature.  Pt did blink to noise but unsure if a reflexive response.  Pt unable to particpate fully due to medical issues.  Did not meet any goals previously set and goals downgraded today, changed frequency to 2 x week and updated d/c plan to [facility].   Pt currently with functional limitations due to balance and endurance deficits. Pt will benefit from skilled PT to increase their independence and safety with mobility to allow discharge to the venue listed below.     Follow Up Recommendations  LTACH;Supervision/Assistance - 24 hour     Equipment Recommendations  (TBD)    Recommendations for Other Services       Precautions / Restrictions Precautions Precautions: Fall Precaution Comments: watch O2 Restrictions Weight Bearing Restrictions: No    Mobility  Bed  Mobility               General bed mobility comments: Nurse agreed to bed level eval.  Pt responds to nail bed pressure by pulling away and did blink to noise but does not blink to threat.  no temperature response.  Spontaneous movement of left >right UE and LE.   Transfers                    Ambulation/Gait                 Stairs             Wheelchair Mobility    Modified Rankin (Stroke Patients Only)       Balance                                            Cognition Arousal/Alertness: Awake/alert Behavior During Therapy: WFL for tasks assessed/performed Overall Cognitive Status: Difficult to assess                                 General Comments: video interpreter used       Exercises General Exercises - Upper Extremity Shoulder Flexion: PROM;Both;5 reps;Supine Shoulder Horizontal ADduction: PROM;Both;5 reps;Supine Elbow Flexion: PROM;Both;5 reps;Supine Elbow Extension: PROM;Both;Supine Digit Composite Flexion: PROM;Both;5 reps;Supine General Exercises - Lower Extremity Ankle Circles/Pumps: PROM;Both;5 reps;Supine Heel Slides: PROM;Both;5 reps;Supine Hip ABduction/ADduction: PROM;Both;5  reps;Supine Other Exercises Other Exercises: Pt had some left spontaneous movement after each exercise performed but appears as trace movement.  Very little right spontaneous movement.  Pt had resistance to movement at bil shoulders and elbow.      General Comments General comments (skin integrity, edema, etc.): VSS with vent on 50% FiO2 and PEEP 10.        Pertinent Vitals/Pain Pain Assessment: Faces Faces Pain Scale: Hurts little more Pain Location: generalized Pain Descriptors / Indicators: Grimacing Pain Intervention(s): Limited activity within patient's tolerance;Monitored during session;Repositioned    Home Living                      Prior Function            PT Goals (current goals can now be  found in the care plan section) Acute Rehab PT Goals Patient Stated Goal: Go home to family PT Goal Formulation: Patient unable to participate in goal setting Time For Goal Achievement: 03/12/20 Potential to Achieve Goals: Fair Progress towards PT goals: Not progressing toward goals - comment(Sedated and unresponsive for the most part)    Frequency    Min 2X/week      PT Plan Discharge plan needs to be updated;Frequency needs to be updated    Co-evaluation              AM-PAC PT "6 Clicks" Mobility   Outcome Measure  Help needed turning from your back to your side while in a flat bed without using bedrails?: Total Help needed moving from lying on your back to sitting on the side of a flat bed without using bedrails?: Total Help needed moving to and from a bed to a chair (including a wheelchair)?: Total Help needed standing up from a chair using your arms (e.g., wheelchair or bedside chair)?: Total Help needed to walk in hospital room?: Total Help needed climbing 3-5 steps with a railing? : Total 6 Click Score: 6    End of Session Equipment Utilized During Treatment: Oxygen(VEnt) Activity Tolerance: Patient limited by fatigue;Patient limited by lethargy Patient left: in bed;with call bell/phone within reach;with bed alarm set Nurse Communication: Mobility status;Need for lift equipment PT Visit Diagnosis: Unsteadiness on feet (R26.81);Other abnormalities of gait and mobility (R26.89);Muscle weakness (generalized) (M62.81)     Time: 8182-9937 PT Time Calculation (min) (ACUTE ONLY): 11 min  Charges:     $$Re-evaluation : 8-23 minutes                 Rishabh Rinkenberger W,PT Acute Rehabilitation Services Pager:  [phone]  Office:  [phone]     Berline Lopes 02/27/2020, 11:28 AM

## 2020-02-27 NOTE — Progress Notes (Signed)
NAME:  Annie Roseboom, MRN:  [id number], DOB:  1941/06/08, LOS: 12 ADMISSION DATE:  02/15/2020, CONSULTATION DATE:  02/19/2020 REFERRING MD:  Dr. Karleen Hampshire, CHIEF COMPLAINT:  Hypotension/ hypoxia    Brief History   79 year old male post COVID and PE in January on Eliquis admitted 2/21 to [facility] for extertional dyspnea found to be anemic with hemoccult positive stools.  GI consulted and transfused PRBC with since stabilized Hgb but has had persistent higher O2 requirements.  Was diuresed but today developed hypotension, with ongoing higher O2 requirements, PCCM was asked to consult.     History of present illness   HPI obtained via interpreter and from daughter.   79 year old male, who speaks [place], with history of HTN, HFpEF, DMT2, glaucoma with recent COVID 01/16/2020 with subsequent subacute PE on Eliquis and home O2 2L [place] who presented 2/21 with exertional dyspnea, black tarry stools, and worsening vision loss in his right eye.  Patient lives at home with his daughter and pre-COVID was independent in his ADLs.  He is a retired and prior worked in an office.  Denies any smoking or pulmonary history.  Reports prior to COVID, had a chronic non-productive cough for 10-15 years and his primary care MD treated it occasionally with cough medicine.   Patient with recent hospitalization at [facility] for [place] after initial positive 1/22 for failed outpatient treatment for COVID infection despite monoclonal antibodies and steroids.  While at [facility], treated with remdesivir and decadron and found to have a PE and was discharged home on 2/6 on 2L O2 and Eliquis.   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    On admit, found to have worsening anemia, 8.8-> 6 with positive fecal occult blood testing and hypotension responsive to fluid.  Additionally found to have WBC 28k, CXR with worsening multifocal airspace consolidation bilaterally.  He was started  empirically on zithromax and cefepime. PCT 0.5.  He was transfused with 2 units PRBC, started on protonix, and GI consulted. However, given tenuous respiratory status and increased O2 needs, GI deferred EGD.  Hgb trend since stable and therefore placed on heparin gtt on 2/23.  He was diuresed on 2/23 given fluid overload post blood transfusion with good output then but has since required midodrine to help with soft blood pressures.  On 2/25, he had worsening hypotension despite midodrine, ongoing elevated O2 needs, and transaminitis, PCCM asked to consult.     Past Medical History  HTN, HFpEF, DMT2, glaucoma with progressive vision loss (left, now rapid loss in right) - recent COVID 01/16/2020 with subsequent PE on Eliquis and O2 2L [place]  Significant Hospital Events   2/21 admit. Stool ob =negatoive  2/23- echo ef 65%  2/25 PCCM consulted for hypotension and persistent hypoxemia  2/26 - Has severe desaturations with activity and eating. Required 10L while eating his meal today. No acute distress but continues to have shortness of breath. Denies chest pain.   2/27 -  -remains on 10 L.  He easily desaturates.  [name] at the bedside.  I spoke to her and subsequently also to their family friend who is in physician assistant in cardiology [place]).  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  According to the daughter he got worse with COVID-19 then got better went home but he declined again in terms of his hypoxemia.  I personally visualized the current  CT scan of the chest compared to 4 weeks ago and to my visualization it looks like an extension of his pulmonary infiltrates from the original COVID-19 admission including a right right upper lobe anterior segment cavity thick-walled.   Consults:  GI  PCCM  Procedures:   Significant Diagnostic Tests:  2/21 CTH >> 1. No acute intracranial abnormalities. 2. Mild cerebral atrophy with chronic  microvascular ischemic changes in the cerebral white matter, as above.  CTA 01/27/20 - Diffuse subpleural/peripheral interstitial ground glass opacities with increased involvement in the the lower lobes bilaterally R>L. CXR 02/16/20 R>L multifocal pneumonia R>L, no edema or effusion  2/23 TTE >> 1. Left ventricular ejection fraction, by estimation, is 60 to 65%. The left ventricle has normal function. The left ventricle has no regional wall motion abnormalities. Left ventricular diastolic parameters are consistent with Grade II diastolic dysfunction (pseudonormalization).  2. The history indicates the patient has a pulmonary embolus. The RV is  not dilated and there is normal RV function. . Right ventricular systolic function is low normal. The right ventricular size is normal.  3. The mitral valve is normal in structure and function. Mild mitral valve regurgitation. No evidence of mitral stenosis.  4. The aortic valve is normal in structure and function. Aortic valve regurgitation is not visualized. No aortic stenosis is present.   Micro Data:  2/21 BCx 2 >> ngtd x 4 days 2/25 - CCP 2/27 - quant gold -  2/27 RVP - negative 2/28  - urine strep - negative 228 - urine leg -  2/28 - aspergillus Ab -  xxxxxxxxxxxx 3/1 BAL Cx, fungal, acid fast >> 3/1 BAL PCP>>  Antimicrobials:  2/21 azithro>>2/26 2/21 ceftriaxone  2/22 cefepime >> 2/28 Bactrim >3/4 3/1 Flagyl> 3/4 Zyvox  Interim history/subjective:  SpO2 100% on FIO2 50% and PEEP 10. Sedated on low dose vasopressor support  Objective   Blood pressure 122/60, pulse 61, temperature 98.7 F (37.1 C), temperature source Rectal, resp. rate 19, height _0  (1.575 m), weight 60 kg, SpO2 99 %.    Vent Mode: PCV FiO2 (%):  [50 %] 50 % Set Rate:  [28 bmp] 28 bmp PEEP:  [10 cmH20] 10 cmH20 Plateau Pressure:  [24 cmH20-40 cmH20] 24 cmH20   Intake/Output Summary (Last 24 hours) at 02/27/2020 1130 Last data filed at 02/27/2020 1100 Gross  per 24 hour  Intake 1699.84 ml  Output 1000 ml  Net 699.84 ml   Filed Weights   02/25/20 0407 02/26/20 0429 02/27/20 0408  Weight: 56.9 kg 59 kg 60 kg   Physical Exam: General: Chronically ill-appearing, no acute distress HENT: [place], AT, ETT in place Eyes: EOMI, no scleral icterus Respiratory: Clear to auscultation bilaterally.  No crackles, wheezing or rales Cardiovascular: RRR, -M/R/G, no JVD GI: BS+, soft, nontender Extremities:-Edema,-tenderness Neuro: Opens eyes to voice, grimaces to pain, moves extremities x 4 GU: Foley in place  Resolved Hospital Problem list   Transaminitis -resolved.worsening likely in setting of shock. RUQ unrevealing  Assessment & Plan:  79 year old male never smoker with prior COVID-19 infection diagnosed in January and requiring hospitalization from 2/1-2/6, recently diagnosed pulmonary emboli on anticoagulation and end-stage glaucoma with right vision loss in addition to his known left vision loss who presents with worsening shortness of breath and admitted for acute on chronic respiratory failure secondary to multifocal pneumonia. Has had required high O2 requirements during the course of this hospitalization despite steroid therapy and transferred to the ICU on 2/28.  ARDS/Acute  hypoxemic respiratory failure secondary to cavitary pneumonia. Likely multifactorial with bacterial/necrotizing pneumonia in setting of post-inflammatory COVID-19 infection vs pulmonary fibrosis, atelectasis, pulmonary emboli. Low suspicion for TB even though he is from an endemic area; PPD neg and immigrated >10 years. BAL cell count significant for neutrophilic predominance suggesting bacterial infection. Autoimmune work-up neg. Quantiferon gold pending. Aspergillus ab positive --Full vent support --Wean FIO2/PEEP for goal SpO2 88-95% or pO2 55-80 --ABG PRN --Appreciate ID input. Continue Vori for + aspergillus ag. Continue Zyvox,  Cefepime and Flagyl.  --F/u cultures including  BAL --F/u PCP --Wean solumedrol to 40 mg daily --Lasix 40 mg OTO for fluid balance >10L since admission  Agitation requiring sedation --Continue oxycodone 5 mg q8h --PAD protocol: Wean fentanyl and precedex for goal RASS -1  Septic shock secondary to bacterial pneumonia. LA wnl --Continue antibiotics per ID --Wean levo gtt for MAP goal >65.  --F/u cultures  AKI with hyperkalemia S/p lasix 3/2 and 3/4, 3/5 --Gentle diuresis --BMP @ 1800 to monitor K after diuresis --Monitor UOP/Cr  Right pulmonary emboli --Continue heparin gtt --Trend CBC with hx of melena prior to admission  Acute blood loss anemia, melena. S/p 2U PRBC. Hg stable with no further transfusions --GI consulted on admission but unable to perform EGD due to respiratory status --Trend CBC --PPI daily  Hyponatremia --Trend BMP daily  Hyperglycemia --SSI --Increase Lantus  Chronic diastolic heart failure --Gentle diuresis  Acute urinary retention 2/27 --Foley in place  GOC --Guarded prognosis in setting of respiratory failure in patient with baseline lung function since his COVID infection. Discussed GOC with grandson yesterday. Expressed concerns regarding prolonged need for respiratory support including patient's ability to be extubated successfully. His deconditioning from his prior COVID infection places him at a disadvantage and would likely result in re-intubation and probable need for tracheostomy.  --Appreciate palliative consult.  Best practice:  Diet: TF Pain/Anxiety/Delirium protocol (if indicated): yes VAP protocol (if indicated): yes DVT prophylaxis: heparin gtt (stop eliquis) GI prophylaxis: PPI bid dosing Glucose control: SSI Mobility: push  Code Status: Full  Family Communication:d.w Rowe Pavy the PA in cards who his husband ofpatient's distance niece and is family spokesperson due to medical knowledge.   Discussed [place] with grandson on 3/4.  Disposition: ICU  SIGNATURE   The  patient is critically ill with multiple organ systems failure and requires high complexity decision making for assessment and support, frequent evaluation and titration of therapies, application of advanced monitoring technologies and extensive interpretation of multiple databases.   Critical Care Time devoted to patient care services described in this note is 38 Minutes.   Rodman Pickle, M.D. [facility] Pulmonary/Critical Care Medicine 02/27/2020 11:30 AM   Please see Amion for pager number to reach on-call Pulmonary and Critical Care Team.   LABS    PULMONARY Recent Labs  Lab 02/25/20 0933 02/25/20 1546 02/26/20 0413 02/26/20 1225 02/27/20 0820  PHART 7.317* 7.382 7.372 7.401 7.315*  PCO2ART 39.7 32.1 32.6 29.6* 34.1  PO2ART 80.0* 54.0* 61.0* 61.0* 57.0*  HCO3 20.4 19.1* 19.1* 18.5* 17.5*  TCO2 22 20* 20* 19* 19*  O2SAT 95.0 88.0 91.0 92.0 88.0    CBC Recent Labs  Lab 02/24/20 0657 02/24/20 1220 02/25/20 0428 02/25/20 0933 02/26/20 0440 02/26/20 1225 02/27/20 0820  HGB 7.9*   < > 7.9*   7.8*   < > 7.9* 10.9* 7.8*  HCT 25.8*   < > 25.4*   23.0*   < > 24.4* 32.0* 23.0*  WBC 34.7*  --  38.4*  --  40.5*  --   --   PLT 105*  --  126*  --  156  --   --    < > = values in this interval not displayed.    COAGULATION No results for input(s): INR in the last 168 hours.  CARDIAC  No results for input(s): TROPONINI in the last 168 hours. No results for input(s): PROBNP in the last 168 hours.   CHEMISTRY Recent Labs  Lab 02/24/20 0301 02/24/20 0556 02/24/20 0657 02/24/20 1220 02/25/20 0428 02/25/20 0933 02/26/20 0413 02/26/20 0413 02/26/20 0440 02/26/20 0440 02/26/20 1225 02/26/20 1225 02/27/20 0425 02/27/20 0820  NA 149*   < > 129*   < > 128*   130*   < > 131*  --  129*  --  130*  --  131* 130*  K 4.3   < > 4.8   < > 4.3   4.3   < > 4.7   < > 5.1   < > 5.1   < > 5.4* 5.3*  CL 116*  --  100  --  99  --   --   --  102  --   --   --  102  --   CO2 23  --  22   --  20*  --   --   --  18*  --   --   --  19*  --   GLUCOSE 206*  --  391*  --  224*  --   --   --  187*  --   --   --  172*  --   BUN 110*  --  39*  --  54*  --   --   --  63*  --   --   --  75*  --   CREATININE 1.53*  --  1.06  --  1.59*  --   --   --  1.50*  --   --   --  1.50*  --   CALCIUM 9.2  --  7.2*  --  7.2*  --   --   --  7.2*  --   --   --  7.5*  --    < > = values in this interval not displayed.   Estimated Creatinine Clearance: 31.3 mL/min (A) (by C-G formula based on SCr of 1.5 mg/dL (H)).   LIVER Recent Labs  Lab 02/23/20 0420 02/24/20 0301 02/25/20 0428 02/26/20 0440 02/27/20 0425  AST 35 87* 22 26 35  ALT 78* 102* 40 32 31  ALKPHOS 131* 239* 138* 123 111  BILITOT 0.5 0.5 0.4 0.5 0.5  PROT 5.0* 8.6* 5.0* 4.9* 5.1*  ALBUMIN 1.4* 1.9* 1.3* 1.3* 1.4*     INFECTIOUS Recent Labs  Lab 02/23/20 0420 02/25/20 1315 02/25/20 1644  LATICACIDVEN 1.5 1.6 1.8     ENDOCRINE CBG (last 3)  Recent Labs    02/26/20 2337 02/27/20 0333 02/27/20 0817  GLUCAP 110* 129* 249*         IMAGING x48h  - image(s) personally visualized  -   highlighted in bold DG Chest Port 1 View  Result Date: 02/25/2020 CLINICAL DATA:  COVID pneumonia. EXAM: PORTABLE CHEST 1 VIEW COMPARISON:  Chest x-ray 02/22/2020 FINDINGS: The endotracheal tube is 5 cm above the carina. The NG tube is coursing down the esophagus and into the stomach. The right PICC line tip is in good position with its tip  near the cavoatrial junction. The cardiac silhouette, mediastinal and hilar contours are within normal limits and stable. Persistent bilateral patchy interstitial and airspace infiltrates consistent with COVID pneumonia. No new or progressive findings. No pleural effusions. IMPRESSION: 1. Stable support apparatus.  New right PICC line in good position. 2. Persistent bilateral interstitial and airspace infiltrates. Electronically Signed   By: Marijo Sanes M.D.   On: 02/25/2020 11:59

## 2020-02-27 NOTE — Progress Notes (Addendum)
Brief meeting with patient's daughter at bedside.  Patient is alert and attempting to follow her commands.   She asks about his respiratory status.  I told her it is about the same today as yesterday.  I explained antibiotics have been changed as some cultures returned with a specific bacteria.  I attempted to talk with her about extubation but I am concerned there was a language barrier that made it difficult for both of [place] - - when Mr. Mottola is ready we would attempt to extubate successfully.  However, before extubation is attempted the family will need to make a decision.  If extubation is successful then there is no issue.  If extubation is not successful the family would need to decide ahead of time either (1) re-intubate and place tracheostomy and PEG or (2) do not re-intubate and allow him to gently pass away.   If he requires a trach / PEG he would likely live in a nursing home for a long period of time perhaps permanently.  Mr. Lares daughter indicated that the family had not made a decision about trach yet.  I suggested that we gather in the next day or two and talk about it.  She explained that the patient's wife is in [place] and now [place] has returned to [place] as well.  The patient has 1 son and 1 dtr here.  She suggested we could talk on Zoom.  This is a complex situation with language barrier, no clear decision maker, and family geographical challenges. I will ask [name]'s help in communicating with the family and in determining the appropriate decision maker (s).  Briefly discussed with bedside RN and CCM MD.  Norvel Richards, PA-C Palliative Medicine Office:  [phone]  15 min. Greater than 50%  of this time was spent counseling and coordinating care related to the above assessment and plan.

## 2020-02-28 ENCOUNTER — Inpatient Hospital Stay (HOSPITAL_COMMUNITY): Payer: Medicare Other

## 2020-02-28 DIAGNOSIS — J189 Pneumonia, unspecified organism: Secondary | ICD-10-CM

## 2020-02-28 DIAGNOSIS — J984 Other disorders of lung: Secondary | ICD-10-CM

## 2020-02-28 DIAGNOSIS — Z515 Encounter for palliative care: Secondary | ICD-10-CM

## 2020-02-28 LAB — COMPREHENSIVE METABOLIC PANEL
ALT: 26 U/L (ref 0–44)
AST: 43 U/L — ABNORMAL HIGH (ref 15–41)
Albumin: 1.3 g/dL — ABNORMAL LOW (ref 3.5–5.0)
Alkaline Phosphatase: 116 U/L (ref 38–126)
Anion gap: 12 (ref 5–15)
BUN: 85 mg/dL — ABNORMAL HIGH (ref 8–23)
CO2: 18 mmol/L — ABNORMAL LOW (ref 22–32)
Calcium: 7.5 mg/dL — ABNORMAL LOW (ref 8.9–10.3)
Chloride: 105 mmol/L (ref 98–111)
Creatinine, Ser: 1.64 mg/dL — ABNORMAL HIGH (ref 0.61–1.24)
GFR calc Af Amer: 46 mL/min — ABNORMAL LOW (ref >60)
GFR calc non Af Amer: 39 mL/min — ABNORMAL LOW (ref >60)
Glucose, Bld: 129 mg/dL — ABNORMAL HIGH (ref 70–99)
Potassium: 5.3 mmol/L — ABNORMAL HIGH (ref 3.5–5.1)
Sodium: 135 mmol/L (ref 135–145)
Total Bilirubin: 0.5 mg/dL (ref 0.3–1.2)
Total Protein: 4.8 g/dL — ABNORMAL LOW (ref 6.5–8.1)

## 2020-02-28 LAB — CBC
HCT: 25.3 % — ABNORMAL LOW (ref 39.0–52.0)
Hemoglobin: 8 g/dL — ABNORMAL LOW (ref 13.0–17.0)
MCH: 27.8 pg (ref 26.0–34.0)
MCHC: 31.6 g/dL (ref 30.0–36.0)
MCV: 87.8 fL (ref 80.0–100.0)
Platelets: 265 K/uL (ref 150–400)
RBC: 2.88 MIL/uL — ABNORMAL LOW (ref 4.22–5.81)
RDW: 18.6 % — ABNORMAL HIGH (ref 11.5–15.5)
WBC: 41 K/uL — ABNORMAL HIGH (ref 4.0–10.5)
nRBC: 0.9 % — ABNORMAL HIGH (ref 0.0–0.2)

## 2020-02-28 LAB — CULTURE, BLOOD (ROUTINE X 2)
Culture: NO GROWTH
Culture: NO GROWTH

## 2020-02-28 LAB — GLUCOSE, CAPILLARY
Glucose-Capillary: 137 mg/dL — ABNORMAL HIGH (ref 70–99)
Glucose-Capillary: 141 mg/dL — ABNORMAL HIGH (ref 70–99)
Glucose-Capillary: 159 mg/dL — ABNORMAL HIGH (ref 70–99)
Glucose-Capillary: 160 mg/dL — ABNORMAL HIGH (ref 70–99)
Glucose-Capillary: 163 mg/dL — ABNORMAL HIGH (ref 70–99)
Glucose-Capillary: 188 mg/dL — ABNORMAL HIGH (ref 70–99)
Glucose-Capillary: 85 mg/dL (ref 70–99)

## 2020-02-28 LAB — HEPARIN LEVEL (UNFRACTIONATED): Heparin Unfractionated: 0.48 [IU]/mL (ref 0.30–0.70)

## 2020-02-28 LAB — APTT: aPTT: 60 s — ABNORMAL HIGH (ref 24–36)

## 2020-02-28 MED ORDER — CLONAZEPAM 0.1 MG/ML ORAL SUSPENSION: 1.0000 mg | Freq: Two times a day (BID) | ORAL | Status: DC

## 2020-02-28 MED ORDER — CLONAZEPAM 0.5 MG PO TBDP
1.0000 mg | ORAL_TABLET | Freq: Two times a day (BID) | ORAL | Status: DC
  Administered 2020-02-28 (×2): 1 mg
  Filled 2020-02-28 (×2): qty 2

## 2020-02-28 MED ORDER — QUETIAPINE FUMARATE 50 MG PO TABS
50.0000 mg | ORAL_TABLET | Freq: Two times a day (BID) | ORAL | Status: DC
  Administered 2020-02-28 (×2): 50 mg
  Filled 2020-02-28 (×2): qty 1

## 2020-02-28 MED ORDER — OXYCODONE HCL 5 MG/5ML PO SOLN
10.0000 mg | Freq: Four times a day (QID) | ORAL | Status: DC
  Administered 2020-02-29 (×2): 10 mg
  Filled 2020-02-28 (×2): qty 10

## 2020-02-28 NOTE — Progress Notes (Signed)
NAME:  Bruce Webb, MRN:  [id number], DOB:  [birth year], LOS: 70 ADMISSION DATE:  02/15/2020, CONSULTATION DATE:  02/19/2020 REFERRING MD:  Dr. Karleen Hampshire, CHIEF COMPLAINT:  Hypotension/ hypoxia    Brief History   79 year old male post COVID and PE in January on Eliquis admitted 2/21 to [facility] for extertional dyspnea found to be anemic with hemoccult positive stools.  GI consulted and transfused PRBC with since stabilized Hgb but has had persistent higher O2 requirements.  Was diuresed but today developed hypotension, with ongoing higher O2 requirements, PCCM was asked to consult.     History of present illness   HPI obtained via interpreter and from daughter.   79 year old male, who speaks [place], with history of HTN, HFpEF, DMT2, glaucoma with recent COVID 01/16/2020 with subsequent subacute PE on Eliquis and home O2 2L [place] who presented 2/21 with exertional dyspnea, black tarry stools, and worsening vision loss in his right eye.  Patient lives at home with his daughter and pre-COVID was independent in his ADLs.  He is a retired and prior worked in an office.  Denies any smoking or pulmonary history.  Reports prior to COVID, had a chronic non-productive cough for 10-15 years and his primary care MD treated it occasionally with cough medicine.   Patient with recent hospitalization at [facility] for [place] after initial positive 1/22 for failed outpatient treatment for COVID infection despite monoclonal antibodies and steroids.  While at [facility], treated with remdesivir and decadron and found to have a PE and was discharged home on 2/6 on 2L O2 and Eliquis.   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    On admit, found to have worsening anemia, 8.8-> 6 with positive fecal occult blood testing and hypotension responsive to fluid.  Additionally found to have WBC 28k, CXR with worsening multifocal airspace consolidation bilaterally.  He was started  empirically on zithromax and cefepime. PCT 0.5.  He was transfused with 2 units PRBC, started on protonix, and GI consulted. However, given tenuous respiratory status and increased O2 needs, GI deferred EGD.  Hgb trend since stable and therefore placed on heparin gtt on 2/23.  He was diuresed on 2/23 given fluid overload post blood transfusion with good output then but has since required midodrine to help with soft blood pressures.  On 2/25, he had worsening hypotension despite midodrine, ongoing elevated O2 needs, and transaminitis, PCCM asked to consult.     Past Medical History  HTN, HFpEF, DMT2, glaucoma with progressive vision loss (left, now rapid loss in right) - recent COVID 01/16/2020 with subsequent PE on Eliquis and O2 2L [place]  Significant Hospital Events   2/21 admit. Stool ob =negatoive  2/23- echo ef 65%  2/25 PCCM consulted for hypotension and persistent hypoxemia  2/26 - Has severe desaturations with activity and eating. Required 10L while eating his meal today. No acute distress but continues to have shortness of breath. Denies chest pain.   2/27 -  -remains on 10 L.  He easily desaturates.  [name] at the bedside.  I spoke to her and subsequently also to their family friend who is in physician assistant in cardiology [place]).  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  According to the daughter he got worse with COVID-19 then got better went home but he declined again in terms of his hypoxemia.   Consults:  GI  PCCM  Procedures:   Significant Diagnostic Tests:  2/21 CTH >> 1. No acute intracranial abnormalities. 2. Mild cerebral atrophy with chronic microvascular ischemic changes in the cerebral white matter, as above.  CTA 01/27/20 - Diffuse subpleural/peripheral interstitial ground glass opacities with increased involvement in the the lower lobes bilaterally R>L. CXR 02/16/20 R>L multifocal pneumonia R>L, no edema or  effusion  2/23 TTE >> 1. Left ventricular ejection fraction, by estimation, is 60 to 65%. The left ventricle has normal function. The left ventricle has no regional wall motion abnormalities. Left ventricular diastolic parameters are consistent with Grade II diastolic dysfunction (pseudonormalization).  2. The history indicates the patient has a pulmonary embolus. The RV is  not dilated and there is normal RV function. . Right ventricular systolic function is low normal. The right ventricular size is normal.  3. The mitral valve is normal in structure and function. Mild mitral valve regurgitation. No evidence of mitral stenosis.  4. The aortic valve is normal in structure and function. Aortic valve regurgitation is not visualized. No aortic stenosis is present.   Micro Data:  2/21 BCx 2 >> ngtd x 4 days 2/25 - CCP 2/27 - quant gold -  2/27 RVP - negative 2/28  - urine strep - negative 228 - urine leg -  2/28 - aspergillus Ab -  xxxxxxxxxxxx 3/1 BAL Cx, fungal, acid fast >> 3/1 BAL PCP>>  Antimicrobials:  2/21 azithro>>2/26 2/21 ceftriaxone  2/22 cefepime >> 2/28 Bactrim >3/4 3/1 Flagyl> 3/4 Zyvox  Interim history/subjective:  SpO2 100% on FIO2 50% and PEEP 10. Sedated on low dose vasopressor support  Objective   Blood pressure (!) 121/52, pulse 77, temperature 97.8 F (36.6 C), temperature source Oral, resp. rate 17, height _0  (1.575 m), weight 59.7 kg, SpO2 100 %.    Vent Mode: PCV FiO2 (%):  [40 %-50 %] 40 % Set Rate:  [28 bmp] 28 bmp PEEP:  [8 cmH20] 8 cmH20 Plateau Pressure:  [27 cmH20-36 cmH20] 36 cmH20   Intake/Output Summary (Last 24 hours) at 02/28/2020 1127 Last data filed at 02/28/2020 1100 Gross per 24 hour  Intake 2462.19 ml  Output 1475 ml  Net 987.19 ml   Filed Weights   02/26/20 0429 02/27/20 0408 02/28/20 0300  Weight: 59 kg 60 kg 59.7 kg   Physical Exam: General: Chronically ill-appearing, no acute distress HENT: [place], AT, ETT in place Eyes:  EOMI, no scleral icterus Respiratory: Clear to auscultation bilaterally.  No crackles, wheezing or rales Cardiovascular: RRR, -M/R/G, no JVD GI: BS+, soft, nontender Extremities:-Edema,-tenderness Neuro: Opens eyes to voice, grimaces to pain, moves extremities x 4 GU: Foley in place  Resolved Hospital Problem list   Transaminitis -resolved.worsening likely in setting of shock. RUQ unrevealing  Assessment & Plan:  79 year old male never smoker with prior COVID-19 infection diagnosed in January and requiring hospitalization from 2/1-2/6, recently diagnosed pulmonary emboli on anticoagulation and end-stage glaucoma with right vision loss in addition to his known left vision loss who presents with worsening shortness of breath and admitted for acute on chronic respiratory failure secondary to multifocal pneumonia. Has had required high O2 requirements during the course of this hospitalization despite steroid therapy and transferred to the ICU on 2/28.  ARDS/Acute hypoxemic respiratory failure secondary to cavitary pneumonia and post COVID lung disease requiring mechanical ventilation. -Full vent support - Will likely need tracheostomy as anticipate slow recovery from pneumonia superimposed on already compromised lungs.  Cavitary pneumonia of unclear etiology. Most likely  bacterial - anaerobic from oral organisms. Relative immunosuppression from [place] may have led to aspergillus. TB negative. --Appreciate ID input. Continue Vori for + aspergillus ag. Continue Zyvox,  Amp-sulbactam and Flagyl.   Agitation requiring sedation --Continue oxycodone 5 mg q8h --PAD protocol: Wean fentanyl and precedex for goal RASS -1  AKI with hyperkalemia S/p lasix 3/2 and 3/4, 3/5 --Gentle diuresis --BMP @ 1800 to monitor K after diuresis --Monitor UOP/Cr  Small bilateral pulmonary emboli which may represent thrombosis in situ from pneumonia. --Continue heparin gtt --Trend CBC with hx of melena prior to  admission  Acute blood loss anemia, melena. S/p 2U PRBC. Hg stable with no further transfusions --GI consulted on admission but unable to perform EGD due to respiratory status --Trend CBC --PPI daily  Hyponatremia --Trend BMP daily  Hyperglycemia -SSI -Increase Lantus  Chronic diastolic heart failure -Gentle diuresis  Acute urinary retention 2/27 -Foley in place   Daily Goals Checklist  Pain/Anxiety/Delirium protocol (if indicated): increase enteral sedatives and wean precedex and fentanyl offf VAP protocol (if indicated): bundle in place. Respiratory support goals: full ventilatory support. Blood pressure target: MAP>65, off NE DVT prophylaxis: IV heparin Nutrition Status: Nutrition Problem: Inadequate oral intake Etiology: altered GI function Signs/Symptoms: NPO status Interventions: Tube feeding GI prophylaxis: pantoprazole. Fluid status goals: Gentle diuresis to keep even Urinary catheter: Condom. Central line: PICC Glucose control: SSI - euglycemic  Mobility/therapy needs: bed rest Antibiotic de-escalation: continue current antibiotics. Home medication reconciliation: n/a Daily labs: cbc, bmp Code Status: full Family Communication: will update family today  Disposition: ICU.   LABS   Recent Labs  Lab 02/25/20 0933 02/25/20 1546 02/26/20 0413 02/26/20 1225 02/27/20 0820  PHART 7.317* 7.382 7.372 7.401 7.315*  PCO2ART 39.7 32.1 32.6 29.6* 34.1  PO2ART 80.0* 54.0* 61.0* 61.0* 57.0*  HCO3 20.4 19.1* 19.1* 18.5* 17.5*  TCO2 22 20* 20* 19* 19*  O2SAT 95.0 88.0 91.0 92.0 88.0    CBC Recent Labs  Lab 02/26/20 0440 02/26/20 1225 02/27/20 0820 02/27/20 1000 02/28/20 0344  HGB 7.9*   < > 7.8* 7.9* 8.0*  HCT 24.4*   < > 23.0* 25.2* 25.3*  WBC 40.5*  --   --  43.5* 41.0*  PLT 156  --   --  243 265   < > = values in this interval not displayed.    COAGULATION No results for input(s): INR in the last 168 hours.  CARDIAC  No results for input(s):  TROPONINI in the last 168 hours. No results for input(s): PROBNP in the last 168 hours.   CHEMISTRY Recent Labs  Lab 02/25/20 0428 02/25/20 0933 02/26/20 0440 02/26/20 0440 02/26/20 1225 02/26/20 1225 02/27/20 0425 02/27/20 0425 02/27/20 0820 02/27/20 0820 02/27/20 1705 02/28/20 0344  NA 128*  130*   < > 129*   < > 130*  --  131*  --  130*  --  131* 135  K 4.3  4.3   < > 5.1   < > 5.1   < > 5.4*   < > 5.3*   < > 4.9 5.3*  CL 99  --  102  --   --   --  102  --   --   --  105 105  CO2 20*  --  18*  --   --   --  19*  --   --   --  19* 18*  GLUCOSE 224*  --  187*  --   --   --  172*  --   --   --  149* 129*  BUN 54*  --  63*  --   --   --  75*  --   --   --  81* 85*  CREATININE 1.59*  --  1.50*  --   --   --  1.50*  --   --   --  1.58* 1.64*  CALCIUM 7.2*  --  7.2*  --   --   --  7.5*  --   --   --  7.4* 7.5*   < > = values in this interval not displayed.   Estimated Creatinine Clearance: 28.7 mL/min (A) (by C-G formula based on SCr of 1.64 mg/dL (H)).   LIVER Recent Labs  Lab 02/24/20 0301 02/25/20 0428 02/26/20 0440 02/27/20 0425 02/28/20 0344  AST 87* 22 26 35 43*  ALT 102* 40 32 31 26  ALKPHOS 239* 138* 123 111 116  BILITOT 0.5 0.4 0.5 0.5 0.5  PROT 8.6* 5.0* 4.9* 5.1* 4.8*  ALBUMIN 1.9* 1.3* 1.3* 1.4* 1.3*     INFECTIOUS Recent Labs  Lab 02/23/20 0420 02/25/20 1315 02/25/20 1644  LATICACIDVEN 1.5 1.6 1.8     ENDOCRINE CBG (last 3)  Recent Labs    02/28/20 0040 02/28/20 0322 02/28/20 0752  GLUCAP 141* 137* 85   CRITICAL CARE Performed by: Kipp Brood   Total critical care time: 32 minutes  Critical care time was exclusive of separately billable procedures and treating other patients.  Critical care was necessary to treat or prevent imminent or life-threatening deterioration.  Critical care was time spent personally by me on the following activities: development of treatment plan with patient and/or surrogate as well as nursing,  discussions with consultants, evaluation of patient's response to treatment, examination of patient, obtaining history from patient or surrogate, ordering and performing treatments and interventions, ordering and review of laboratory studies, ordering and review of radiographic studies, pulse oximetry, re-evaluation of patient's condition and participation in multidisciplinary rounds.  Kipp Brood, MD [facility] ICU Physician [place]  Pager: [phone] Mobile: [phone] After hours: [phone].

## 2020-02-28 NOTE — Progress Notes (Signed)
Patient slowly oozing maroon colored bloody secretions during mouth care. Pharmacy states it is okay to continue with heparin drip so long as the bloody oral secretions do not increase.

## 2020-02-28 NOTE — Progress Notes (Addendum)
Pt given new medication Seroquel and Klonopin at about 1400. Patient required multiple prn boluses of Fentanyl this afternoon in addition to Fentanyl and Precedex drip due to double stacking and breathing against ventilator, causing it to alarm. Current RASS -4. Dr Katrinka Blazing at bedside, changed ventilator settings.    Updated MD Agarwala by phone. MD states to leave sedation at current rate and to allow the patient to double stack on the ventilator from now on.

## 2020-02-28 NOTE — Progress Notes (Signed)
ANTICOAGULATION CONSULT NOTE   Pharmacy Consult for Heparin Indication: Small subacute PE  No Known Allergies  Patient Measurements: Height: 5\' 2"  (157.5 cm) Weight: 131 lb 9.8 oz (59.7 kg) IBW/kg (Calculated) : 54.6 Heparin Dosing Weight: 52kg  Vital Signs: Temp: 97.8 F (36.6 C) (03/06 0755) Temp Source: Oral (03/06 0755) BP: 138/51 (03/06 0446) Pulse Rate: 57 (03/06 0600)  Labs: Recent Labs    02/26/20 0440 02/26/20 0440 02/26/20 1225 02/26/20 1814 02/27/20 0425 02/27/20 0820 02/27/20 0820 02/27/20 1000 02/27/20 1705 02/28/20 0344  HGB 7.9*  --    < >  --   --  7.8*   < > 7.9*  --  8.0*  HCT 24.4*  --    < >  --   --  23.0*  --  25.2*  --  25.3*  PLT 156  --   --   --   --   --   --  243  --  265  APTT 91*  --   --   --  60*  --   --   --   --  60*  HEPARINUNFRC 0.57  --   --  0.48 0.49  --   --   --   --  0.48  CREATININE 1.50*   < >  --   --  1.50*  --   --   --  1.58* 1.64*   < > = values in this interval not displayed.    Estimated Creatinine Clearance: 28.7 mL/min (A) (by C-G formula based on SCr of 1.64 mg/dL (H)).   Assessment: 79 y.o. male with 01/27/20 small subacute PE, PTA Eliquis on hold, last dose 2/21. Started on heparin with low goal for H/H drop requiring transfusion on admit, stable H/H and platelets x5 days. Switched back to Apixaban 2/26 >> 2/27, back to IV heparin on 2/28 due to intubation.   Heparin level is therapeutic; no bleeding reported.  Goal of Therapy:  Hep level 0.3 - 0.5 units/mL Monitor platelets by anticoagulation protocol: Yes   Plan:  Continue heparin gtt at 700 units/hr Daily heparin level and CBC  Quanah Majka D. 3/28, PharmD, BCPS, BCCCP 02/28/2020, 7:56 AM

## 2020-02-28 NOTE — Progress Notes (Signed)
ID PROGRESS NOTE  Remains intubated, critically ill  A/p cavitary pneumonia - since + e.faecalis on recent aspirate culture -continue on amp/sub - continue on voriconazole, empiric coverage for aspergillosis. Awaiting fungal cutlures - no longer needs airborne isolation since 3 AFB are negative

## 2020-02-28 NOTE — Progress Notes (Signed)
Daily Progress Note   Patient Name: Bruce Webb       Date: 02/28/2020 DOB: 04/09/1941  Age: 79 y.o. MRN#: [id number] Attending Physician: Bruce Seeds, MD Primary Care Physician: Bruce Chroman, MD Admit Date: 02/15/2020  Reason for Consultation/Follow-up: Establishing goals of care and Psychosocial/spiritual support  Subjective: Visited patient at bedside.  He is intubated and is not responsive to me.  Spoke with CCM MD about patient  Bruce Webb (cardiology PA who is translating for family).  Explained that from a lab perspective things are some what unchanged.  Gave micro results and discussed change to Unasyn.  Expressed my opinion that patient "simply looks terrible".  Bruce Webb understood.  I asked Bruce Webb for a specific decision maker for the CCM team to communicate with.  Bruce Webb stated that the patient's daughter in the [place] (Bruce sp?) is the decision maker and she can be reached thru her son Bruce Webb  Phone [phone].  Bruce Webb shares further that the patient's son is coming from [place] next week.  The family wants the son to visit and then they will likely make the decision to extubate.  Bruce Webb states the family may trach but more than likely will just place oxygen on Bruce Webb after extubation and follow a palliative course - allowing him to live or pass without heroic interventions.   Assessment: Patient suffering with cavitary pneumonia post severe COVID 19 illness.  Remains intubated.  Currently unresponsive to my exam   Patient Profile/HPI:  79 y.o.malewith past medical history of hypertension, diet-controlled diabetes, history of recent Covid infection 1/22admitted on2/21/2021with pneumonia due to COVID-19.  Suffered with GI bleed, progressive vision loss, and  worsening hypotension and hypoxia.  Intubated 2/28.     Length of Stay: 13  Current Medications: Scheduled Meds:  . brimonidine  1 drop Both Eyes BID  . budesonide (PULMICORT) nebulizer solution  0.5 mg Nebulization BID  . chlorhexidine gluconate (MEDLINE KIT)  15 mL Mouth Rinse BID  . Chlorhexidine Gluconate Cloth  6 each Topical Daily  . dorzolamide  1 drop Both Eyes BID  . insulin aspart  0-15 Units Subcutaneous Q4H  . insulin aspart  6 Units Subcutaneous Q4H  . insulin glargine  20 Units Subcutaneous Daily  . latanoprost  1 drop Both Eyes QHS  . mouth rinse  15 mL Mouth Rinse 10 times per day  . methylPREDNISolone (SOLU-MEDROL) injection  40 mg Intravenous Q12H  . midodrine  10 mg Per Tube BID WC  . multivitamin with minerals  1 tablet Per Tube Daily  . oxyCODONE  5 mg Per Tube Q8H  . pantoprazole (PROTONIX) IV  40 mg Intravenous Q24H  . polyethylene glycol  17 g Per Tube Daily  . rocuronium  52 mg Intravenous Once  . senna-docusate  2 tablet Per Tube BID  . sodium chloride flush  10-40 mL Intracatheter Q12H  . sodium chloride flush  3 mL Intravenous Q12H  . sodium chloride flush  3 mL Intravenous Q12H  . timolol  1 drop Both Eyes BID  . voriconazole  200 mg Per Tube Q12H  . white petrolatum   Topical BID    Continuous Infusions: . sodium chloride 10 mL/hr at 02/23/20 0104  . ampicillin-sulbactam (UNASYN) IV Stopped (02/28/20 0844)  . dexmedetomidine (PRECEDEX) IV infusion 0.6 mcg/kg/hr (02/28/20 1200)  . feeding supplement (VITAL AF 1.2 CAL) 1,000 mL (02/28/20 0400)  . fentaNYL infusion INTRAVENOUS 100 mcg/hr (02/28/20 1200)  . heparin 700 Units/hr (02/28/20 1200)  . norepinephrine (LEVOPHED) Adult infusion Stopped (02/28/20 0234)    PRN Meds: sodium chloride, acetaminophen **OR** acetaminophen, docusate, fentaNYL, fentaNYL (SUBLIMAZE) injection, fentaNYL (SUBLIMAZE) injection, midazolam, midazolam, ondansetron **OR** ondansetron (ZOFRAN) IV, sodium chloride  flush, sodium chloride flush  Physical Exam       Critically ill appearing gentleman, eyes open but not seeing CV rrr with murmru resp ETT in place Abdomen soft, nt, nd Ext with 2-3+ edema  Vital Signs: BP (!) 144/61   Pulse 64   Temp 97.8 F (36.6 C) (Oral)   Resp 20   Ht '5\' 2"'$  (1.575 m)   Wt 59.7 kg   SpO2 100%   BMI 24.07 kg/m  SpO2: SpO2: 100 % O2 Device: O2 Device: Ventilator O2 Flow Rate: O2 Flow Rate (L/min): 15 L/min  Intake/output summary:   Intake/Output Summary (Last 24 hours) at 02/28/2020 1255 Last data filed at 02/28/2020 1200 Gross per 24 hour  Intake 2407.34 ml  Output 1400 ml  Net 1007.34 ml   LBM: Last BM Date: (PTA) Baseline Weight: Weight: 67.2 kg Most recent weight: Weight: 59.7 kg       Palliative Assessment/Data: 10%   Patient Active Problem List   Diagnosis Date Noted  . Multifocal pneumonia   . Goals of care, counseling/discussion   . Palliative care by specialist   . Pressure injury of skin 02/19/2020  . Acute GI bleeding 02/15/2020  . Pneumonia with cavity of lung 02/15/2020  . Hyponatremia 02/15/2020  . Hyperglycemia 02/15/2020  . Symptomatic anemia   . Vision loss of right eye   . Pulmonary embolism (Bruce Webb) 01/28/2020  . Pneumonia due to COVID-19 virus 01/27/2020  . Acute respiratory failure with hypoxia ([place]) 01/27/2020  . Essential hypertension 01/27/2020  . DM (diabetes mellitus), type 2 ([place]) 01/27/2020    Palliative Care Plan    Recommendations/Plan:  Per Bruce Webb - patient's daughter at bedside is the decision maker Bruce Webb - I'm probably mis-spelling that).   Family wants son to come from [place] this week and then they will extubate.    Per Bruce Webb the decision is not firm but family is considering extubating without reintubation.  Would pursue a palliative route.  PMT will continue to follow with you on Monday.  Goals of Care and Additional Recommendations:  Limitations on Scope of Treatment: Full Scope  Treatment  Code Status:  Full code  Prognosis:  Patient is continuing to require vent support, pressor support and is not responsive.  Prognosis is very poor.  Discharge Planning:  To Be Determined  Care plan was discussed with Dr. Lynetta Webb, and family translator Bruce Webb (Cards PA)  Thank you for allowing the Palliative Medicine Team to assist in the care of this patient.  Total time spent:  35 min.     Greater than 50%  of this time was spent counseling and coordinating care related to the above assessment and plan.  [name], PA-C Palliative Medicine  Please contact Palliative MedicineTeam phone at [phone] for questions and concerns between 7 am - 7 pm.   Please see AMION for individual provider pager numbers.

## 2020-02-29 LAB — CULTURE, RESPIRATORY W GRAM STAIN

## 2020-02-29 LAB — POCT I-STAT 7, (LYTES, BLD GAS, ICA,H+H)
Acid-base deficit: 6 mmol/L — ABNORMAL HIGH (ref 0.0–2.0)
Bicarbonate: 19.3 mmol/L — ABNORMAL LOW (ref 20.0–28.0)
Calcium, Ion: 1.18 mmol/L (ref 1.15–1.40)
HCT: 21 % — ABNORMAL LOW (ref 39.0–52.0)
Hemoglobin: 7.1 g/dL — ABNORMAL LOW (ref 13.0–17.0)
O2 Saturation: 87 %
Potassium: 4.9 mmol/L (ref 3.5–5.1)
Sodium: 136 mmol/L (ref 135–145)
TCO2: 20 mmol/L — ABNORMAL LOW (ref 22–32)
pCO2 arterial: 34.1 mmHg (ref 32.0–48.0)
pH, Arterial: 7.361 (ref 7.350–7.450)
pO2, Arterial: 54 mmHg — ABNORMAL LOW (ref 83.0–108.0)

## 2020-02-29 LAB — CBC
HCT: 21.2 % — ABNORMAL LOW (ref 39.0–52.0)
Hemoglobin: 6.7 g/dL — CL (ref 13.0–17.0)
MCH: 28.3 pg (ref 26.0–34.0)
MCHC: 31.6 g/dL (ref 30.0–36.0)
MCV: 89.5 fL (ref 80.0–100.0)
Platelets: 267 10*3/uL (ref 150–400)
RBC: 2.37 MIL/uL — ABNORMAL LOW (ref 4.22–5.81)
RDW: 20.4 % — ABNORMAL HIGH (ref 11.5–15.5)
WBC: 37.3 10*3/uL — ABNORMAL HIGH (ref 4.0–10.5)
nRBC: 1.5 % — ABNORMAL HIGH (ref 0.0–0.2)

## 2020-02-29 LAB — BASIC METABOLIC PANEL
Anion gap: 7 (ref 5–15)
BUN: 86 mg/dL — ABNORMAL HIGH (ref 8–23)
CO2: 20 mmol/L — ABNORMAL LOW (ref 22–32)
Calcium: 7.4 mg/dL — ABNORMAL LOW (ref 8.9–10.3)
Chloride: 110 mmol/L (ref 98–111)
Creatinine, Ser: 1.5 mg/dL — ABNORMAL HIGH (ref 0.61–1.24)
GFR calc Af Amer: 51 mL/min — ABNORMAL LOW (ref >60)
GFR calc non Af Amer: 44 mL/min — ABNORMAL LOW (ref >60)
Glucose, Bld: 122 mg/dL — ABNORMAL HIGH (ref 70–99)
Potassium: 5 mmol/L (ref 3.5–5.1)
Sodium: 137 mmol/L (ref 135–145)

## 2020-02-29 LAB — GLUCOSE, CAPILLARY
Glucose-Capillary: 117 mg/dL — ABNORMAL HIGH (ref 70–99)
Glucose-Capillary: 133 mg/dL — ABNORMAL HIGH (ref 70–99)
Glucose-Capillary: 215 mg/dL — ABNORMAL HIGH (ref 70–99)
Glucose-Capillary: 167 mg/dL — ABNORMAL HIGH (ref 70–99)
Glucose-Capillary: 160 mg/dL — ABNORMAL HIGH (ref 70–99)

## 2020-02-29 LAB — HEPARIN LEVEL (UNFRACTIONATED)
Heparin Unfractionated: 0.51 IU/mL (ref 0.30–0.70)
Heparin Unfractionated: 0.45 IU/mL (ref 0.30–0.70)

## 2020-02-29 NOTE — Progress Notes (Signed)
ANTICOAGULATION CONSULT NOTE   Pharmacy Consult for Heparin Indication: Small subacute PE  No Known Allergies  Patient Measurements: Height: 5\' 2"  (157.5 cm) Weight: 136 lb 3.9 oz (61.8 kg) IBW/kg (Calculated) : 54.6 Heparin Dosing Weight: 52kg  Vital Signs: Temp: 98.7 F (37.1 C) (03/07 0400) Temp Source: Axillary (03/07 0400) BP: 106/56 (03/07 0600) Pulse Rate: 70 (03/07 0600)  Labs: Recent Labs    02/27/20 0425 02/27/20 0425 02/27/20 0820 02/27/20 1000 02/27/20 1000 02/27/20 1705 02/28/20 0344 02/29/20 0526  HGB  --   --    < > 7.9*   < >  --  8.0* 6.7*  HCT  --   --    < > 25.2*  --   --  25.3* 21.2*  PLT  --   --   --  243  --   --  265 267  APTT 60*  --   --   --   --   --  60*  --   HEPARINUNFRC 0.49  --   --   --   --   --  0.48 0.51  CREATININE 1.50*   < >  --   --   --  1.58* 1.64* 1.50*   < > = values in this interval not displayed.    Estimated Creatinine Clearance: 31.3 mL/min (A) (by C-G formula based on SCr of 1.5 mg/dL (H)).   Assessment: 79 y.o. male with 01/27/20 small subacute PE, PTA Eliquis on hold, last dose 2/21. Started on heparin with low goal for H/H drop requiring transfusion on admit, stable H/H and platelets x5 days. Switched back to Apixaban 2/26 >> 2/27, back to IV heparin on 2/28 due to intubation.   Heparin level up to slightly supratherapeutic (0.51) on gtt at 700 units/hr. Hgb down to 6.7 - Elink MD notified with no changes to heparin for now. Pt still with small amount of bleeding from gums.  Goal of Therapy:  Hep level 0.3 - 0.5 units/mL Monitor platelets by anticoagulation protocol: Yes   Plan:  Decrease heparin gtt to 600 units/hr Will f/u 8 hr heparin level F/u for further bleeding and repeat CBC  3/28, PharmD, BCPS Please see amion for complete clinical pharmacist phone list 02/29/2020, 6:41 AM

## 2020-02-29 NOTE — Progress Notes (Signed)
CRITICAL VALUE ALERT  Critical Value:  Hemoglobin 6.7  Date & Time Notied:  02/29/20 0354  Provider Notified: Pola Corn   Orders Received/Actions taken: see new orders

## 2020-02-29 NOTE — Progress Notes (Signed)
ANTICOAGULATION CONSULT NOTE   Pharmacy Consult for Heparin Indication: Small subacute PE  No Known Allergies  Patient Measurements: Height: 5\' 2"  (157.5 cm) Weight: 136 lb 3.9 oz (61.8 kg) IBW/kg (Calculated) : 54.6 Heparin Dosing Weight: 52kg  Vital Signs: Temp: 97.7 F (36.5 C) (03/07 1500) Temp Source: Axillary (03/07 1500) BP: 102/52 (03/07 1600) Pulse Rate: 90 (03/07 1600)  Labs: Recent Labs    02/27/20 0425 02/27/20 0425 02/27/20 0820 02/27/20 1000 02/27/20 1705 02/28/20 0344 02/28/20 0344 02/29/20 0526 02/29/20 1140 02/29/20 1548  HGB  --    < >   < > 7.9*  --  8.0*   < > 6.7* 7.1*  --   HCT  --    < >   < > 25.2*  --  25.3*  --  21.2* 21.0*  --   PLT  --   --   --  243  --  265  --  267  --   --   APTT 60*  --   --   --   --  60*  --   --   --   --   HEPARINUNFRC 0.49   < >  --   --   --  0.48  --  0.51  --  0.45  CREATININE 1.50*   < >  --   --  1.58* 1.64*  --  1.50*  --   --    < > = values in this interval not displayed.    Estimated Creatinine Clearance: 31.3 mL/min (A) (by C-G formula based on SCr of 1.5 mg/dL (H)).   Assessment: 79 y.o. male with 01/27/20 small subacute PE, PTA Eliquis on hold, last dose 2/21. Started on heparin with low goal for H/H drop requiring transfusion on admit, stable H/H and platelets x5 days. Switched back to Apixaban 2/26 >> 2/27, back to IV heparin on 2/28 due to intubation.   Heparin level is now therapeutic at 0.45 on 600 units/hr. Hgb up slightly to 7.1.   Goal of Therapy:  Hep level 0.3 - 0.5 units/mL Monitor platelets by anticoagulation protocol: Yes   Plan:  Continue heparin gtt 600 units/hr Daily heparin level and CBC F/u bleeding  3/28, PharmD, BCPS Clinical Pharmacist Please see AMION for all pharmacy numbers 02/29/2020 5:42 PM

## 2020-02-29 NOTE — Progress Notes (Signed)
NAME:  Bruce Webb, MRN:  [id number], DOB:  12-12-1941, LOS: 70 ADMISSION DATE:  02/15/2020, CONSULTATION DATE:  02/19/2020 REFERRING MD:  Dr. Karleen Hampshire, CHIEF COMPLAINT:  Hypotension/ hypoxia    Brief History   79 year old male post COVID and PE in January on Eliquis admitted 2/21 to [facility] for extertional dyspnea found to be anemic with hemoccult positive stools.  GI consulted and transfused PRBC with since stabilized Hgb but has had persistent higher O2 requirements.  Was diuresed but today developed hypotension, with ongoing higher O2 requirements, PCCM was asked to consult.     History of present illness   HPI obtained via interpreter and from daughter.   79 year old male, who speaks [place], with history of HTN, HFpEF, DMT2, glaucoma with recent COVID 01/16/2020 with subsequent subacute PE on Eliquis and home O2 2L [place] who presented 2/21 with exertional dyspnea, black tarry stools, and worsening vision loss in his right eye.  Patient lives at home with his daughter and pre-COVID was independent in his ADLs.  He is a retired and prior worked in an office.  Denies any smoking or pulmonary history.  Reports prior to COVID, had a chronic non-productive cough for 10-15 years and his primary care MD treated it occasionally with cough medicine.   Patient with recent hospitalization at [facility] for [place] after initial positive 1/22 for failed outpatient treatment for COVID infection despite monoclonal antibodies and steroids.  While at [facility], treated with remdesivir and decadron and found to have a PE and was discharged home on 2/6 on 2L O2 and Eliquis.   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    On admit, found to have worsening anemia, 8.8-> 6 with positive fecal occult blood testing and hypotension responsive to fluid.  Additionally found to have WBC 28k, CXR with worsening multifocal airspace consolidation bilaterally.  He was started  empirically on zithromax and cefepime. PCT 0.5.  He was transfused with 2 units PRBC, started on protonix, and GI consulted. However, given tenuous respiratory status and increased O2 needs, GI deferred EGD.  Hgb trend since stable and therefore placed on heparin gtt on 2/23.  He was diuresed on 2/23 given fluid overload post blood transfusion with good output then but has since required midodrine to help with soft blood pressures.  On 2/25, he had worsening hypotension despite midodrine, ongoing elevated O2 needs, and transaminitis, PCCM asked to consult.     Past Medical History  HTN, HFpEF, DMT2, glaucoma with progressive vision loss (left, now rapid loss in right) - recent COVID 01/16/2020 with subsequent PE on Eliquis and O2 2L [place]  Significant Hospital Events   2/21 admit. Stool ob =negatoive  2/23- echo ef 65%  2/25 PCCM consulted for hypotension and persistent hypoxemia  2/26 - Has severe desaturations with activity and eating. Required 10L while eating his meal today. No acute distress but continues to have shortness of breath. Denies chest pain.   2/27 -  -remains on 10 L.  He easily desaturates.  [name] at the bedside.  I spoke to her and subsequently also to their family friend who is in physician assistant in cardiology [place]).  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  According to the daughter he got worse with COVID-19 then got better went home but he declined again in terms of his hypoxemia.   Consults:  GI  PCCM  Procedures:   Significant Diagnostic Tests:  2/21 CTH >> 1. No acute intracranial abnormalities. 2. Mild cerebral atrophy with chronic microvascular ischemic changes in the cerebral white matter, as above.  CTA 01/27/20 - Diffuse subpleural/peripheral interstitial ground glass opacities with increased involvement in the the lower lobes bilaterally R>L. CXR 02/16/20 R>L multifocal pneumonia R>L, no edema or  effusion  2/23 TTE >> 1. Left ventricular ejection fraction, by estimation, is 60 to 65%. The left ventricle has normal function. The left ventricle has no regional wall motion abnormalities. Left ventricular diastolic parameters are consistent with Grade II diastolic dysfunction (pseudonormalization).  2. The history indicates the patient has a pulmonary embolus. The RV is  not dilated and there is normal RV function. . Right ventricular systolic function is low normal. The right ventricular size is normal.  3. The mitral valve is normal in structure and function. Mild mitral valve regurgitation. No evidence of mitral stenosis.  4. The aortic valve is normal in structure and function. Aortic valve regurgitation is not visualized. No aortic stenosis is present.   Micro Data:  2/21 BCx 2 >> ngtd x 4 days 2/25 - CCP 2/27 - quant gold -  2/27 RVP - negative 2/28  - urine strep - negative 228 - urine leg -  2/28 - aspergillus Ab -  xxxxxxxxxxxx 3/1 BAL Cx, fungal, acid fast >> 3/1 BAL PCP>>  Antimicrobials:  2/21 azithro>>2/26 2/21 ceftriaxone  2/22 cefepime >> 2/28 Bactrim >3/4 3/1 Flagyl> 3/4 Zyvox  Interim history/subjective:   Persistent dyssynchrony with double stacking independent of mode of ventilation.  Improved somewhat with deep sedation.  Switch to pressure support ventilation and tolerating PSV 5/5 with improvement in ventilator dyssynchrony and generating tidal volumes of 700 mL.  Starting to wake up after sedation interruption.  Objective   Blood pressure (!) 102/52, pulse 90, temperature 97.7 F (36.5 C), temperature source Axillary, resp. rate (!) 26, height _0  (1.575 m), weight 61.8 kg, SpO2 99 %.    Vent Mode: PSV;CPAP FiO2 (%):  [40 %-50 %] 50 % Set Rate:  [28 bmp] 28 bmp PEEP:  [5 cmH20-8 cmH20] 5 cmH20 Pressure Support:  [5 cmH20] 5 cmH20 Plateau Pressure:  [15 cmH20-21 cmH20] 15 cmH20   Intake/Output Summary (Last 24 hours) at 02/29/2020  1710 Last data filed at 02/29/2020 1600 Gross per 24 hour  Intake 2902.64 ml  Output 1000 ml  Net 1902.64 ml   Filed Weights   02/27/20 0408 02/28/20 0300 02/29/20 0500  Weight: 60 kg 59.7 kg 61.8 kg   Physical Exam: General: Chronically ill-appearing, no acute distress HENT: [place], AT, ETT in place Eyes: EOMI, no scleral icterus Respiratory: Clear to auscultation bilaterally.  No crackles, wheezing or rales Cardiovascular: RRR, -M/R/G, no JVD GI: BS+, soft, nontender Extremities:-Edema,-tenderness Neuro: Opens eyes to voice, grimaces to pain, moves extremities x 4 GU: Foley in place  Resolved Hospital Problem list   Transaminitis -resolved.worsening likely in setting of shock. RUQ unrevealing  Assessment & Plan:  79 year old male never smoker with prior COVID-19 infection diagnosed in January and requiring hospitalization from 2/1-2/6, recently diagnosed pulmonary emboli on anticoagulation and end-stage glaucoma with right vision loss in addition to his known left vision loss who presents with worsening shortness of breath and admitted for acute on chronic respiratory failure secondary to multifocal pneumonia. Has had required high O2 requirements during the course of this hospitalization despite steroid therapy and transferred to the ICU on 2/28.  ARDS/Acute  hypoxemic respiratory failure secondary to cavitary pneumonia and post COVID lung disease requiring mechanical ventilation. -Acceptable lung mechanics. -Ignore ventilator dyssynchrony unless gas exchange impaired -May be extubatable once sedation wears off.  Cavitary pneumonia of unclear etiology. Most likely bacterial - anaerobic from oral organisms. Relative immunosuppression from [place] may have led to aspergillus. TB negative. --Appreciate ID input. Continue Vori for + aspergillus ag. Continue Zyvox,  Amp-sulbactam and Flagyl.  -Given loss of vision in right eye, will obtain CT of neck to rule out jugular venous thrombosis,  Fusobacterium as a cause of lung abscess.  Agitation requiring sedation -Full sedation vacation.  Avoid any sedation unless gas exchange impaired.  AKI with hyperkalemia S/p lasix 3/2 and 3/4, 3/5 --Gentle diuresis --BMP @ 1800 to monitor K after diuresis --Monitor UOP/Cr  Small bilateral pulmonary emboli which may represent thrombosis in situ from pneumonia. --Continue heparin gtt --Trend CBC with hx of melena prior to admission  Acute blood loss anemia, melena. S/p 2U PRBC. Hg stable with no further transfusions --GI consulted on admission but unable to perform EGD due to respiratory status --Trend CBC --PPI daily  Hyponatremia --Trend BMP daily  Hyperglycemia -SSI -Increase Lantus  Chronic diastolic heart failure -Gentle diuresis  Acute urinary retention 2/27 -Foley in place   Daily Goals Checklist  Pain/Anxiety/Delirium protocol (if indicated): Keep off all sedation.   VAP protocol (if indicated): bundle in place. Respiratory support goals: SBT.  Extubate once mental status allows. Blood pressure target: MAP>65, off NE DVT prophylaxis: IV heparin Nutrition Status: Nutrition Problem: Inadequate oral intake Etiology: altered GI function Signs/Symptoms: NPO status Interventions: Tube feeding GI prophylaxis: pantoprazole. Fluid status goals: Gentle diuresis to keep even Urinary catheter: Condom. Central line: PICC Glucose control: SSI - euglycemic  Mobility/therapy needs: bed rest Antibiotic de-escalation: continue current antibiotics. Home medication reconciliation: n/a Daily labs: cbc, bmp Code Status: full Family Communication: will update family today  Disposition: ICU.   LABS   Recent Labs  Lab 02/25/20 1546 02/26/20 0413 02/26/20 1225 02/27/20 0820 02/29/20 1140  PHART 7.382 7.372 7.401 7.315* 7.361  PCO2ART 32.1 32.6 29.6* 34.1 34.1  PO2ART 54.0* 61.0* 61.0* 57.0* 54.0*  HCO3 19.1* 19.1* 18.5* 17.5* 19.3*  TCO2 20* 20* 19* 19* 20*  O2SAT  88.0 91.0 92.0 88.0 87.0    CBC Recent Labs  Lab 02/27/20 1000 02/27/20 1000 02/28/20 0344 02/29/20 0526 02/29/20 1140  HGB 7.9*   < > 8.0* 6.7* 7.1*  HCT 25.2*   < > 25.3* 21.2* 21.0*  WBC 43.5*  --  41.0* 37.3*  --   PLT 243  --  265 267  --    < > = values in this interval not displayed.    COAGULATION No results for input(s): INR in the last 168 hours.  CARDIAC  No results for input(s): TROPONINI in the last 168 hours. No results for input(s): PROBNP in the last 168 hours.   CHEMISTRY Recent Labs  Lab 02/26/20 0440 02/26/20 1225 02/27/20 0425 02/27/20 0425 02/27/20 0820 02/27/20 0820 02/27/20 1705 02/27/20 1705 02/28/20 0344 02/28/20 0344 02/29/20 0526 02/29/20 1140  NA 129*   < > 131*   < > 130*  --  131*  --  135  --  137 136  K 5.1   < > 5.4*   < > 5.3*   < > 4.9   < > 5.3*   < > 5.0 4.9  CL 102  --  102  --   --   --  105  --  105  --  110  --   CO2 18*  --  19*  --   --   --  19*  --  18*  --  20*  --   GLUCOSE 187*  --  172*  --   --   --  149*  --  129*  --  122*  --   BUN 63*  --  75*  --   --   --  81*  --  85*  --  86*  --   CREATININE 1.50*  --  1.50*  --   --   --  1.58*  --  1.64*  --  1.50*  --   CALCIUM 7.2*  --  7.5*  --   --   --  7.4*  --  7.5*  --  7.4*  --    < > = values in this interval not displayed.   Estimated Creatinine Clearance: 31.3 mL/min (A) (by C-G formula based on SCr of 1.5 mg/dL (H)).   LIVER Recent Labs  Lab 02/24/20 0301 02/25/20 0428 02/26/20 0440 02/27/20 0425 02/28/20 0344  AST 87* 22 26 35 43*  ALT 102* 40 32 31 26  ALKPHOS 239* 138* 123 111 116  BILITOT 0.5 0.4 0.5 0.5 0.5  PROT 8.6* 5.0* 4.9* 5.1* 4.8*  ALBUMIN 1.9* 1.3* 1.3* 1.4* 1.3*     INFECTIOUS Recent Labs  Lab 02/23/20 0420 02/25/20 1315 02/25/20 1644  LATICACIDVEN 1.5 1.6 1.8     ENDOCRINE CBG (last 3)  Recent Labs    02/29/20 0748 02/29/20 1200 02/29/20 1548  GLUCAP 133* 215* 167*   CRITICAL CARE Performed by: Kipp Brood   Total critical care time: 40 minutes  Critical care time was exclusive of separately billable procedures and treating other patients.  Critical care was necessary to treat or prevent imminent or life-threatening deterioration.  Critical care was time spent personally by me on the following activities: development of treatment plan with patient and/or surrogate as well as nursing, discussions with consultants, evaluation of patient's response to treatment, examination of patient, obtaining history from patient or surrogate, ordering and performing treatments and interventions, ordering and review of laboratory studies, ordering and review of radiographic studies, pulse oximetry, re-evaluation of patient's condition and participation in multidisciplinary rounds.  Kipp Brood, MD [facility] ICU Physician [place]  Pager: [phone] Mobile: [phone] After hours: [phone].

## 2020-02-29 NOTE — Progress Notes (Addendum)
[facility] for Infectious Disease    Date of Admission:  02/15/2020   Total days of antibiotics 14/ day 5 vori/ day 3 amp/sub           ID: Bruce Webb is a 79 y.o. male with respiratory failure with cavitary pneumonia Principal Problem:   Acute GI bleeding Active Problems:   Essential hypertension   Pulmonary embolism (HCC)   Pneumonia with cavity of lung   Hyponatremia   Hyperglycemia   Pressure injury of skin   Goals of care, counseling/discussion   Palliative care by specialist   Multifocal pneumonia   Cavitary pneumonia   Palliative care encounter    Subjective: Afebrile, on pressure support now. Has been off of sedation this morning. Appears to have low hgb overnight, no melenic stools - does have low hgb at baseline  Medications:  . brimonidine  1 drop Both Eyes BID  . chlorhexidine gluconate (MEDLINE KIT)  15 mL Mouth Rinse BID  . Chlorhexidine Gluconate Cloth  6 each Topical Daily  . dorzolamide  1 drop Both Eyes BID  . insulin aspart  0-15 Units Subcutaneous Q4H  . insulin aspart  6 Units Subcutaneous Q4H  . insulin glargine  20 Units Subcutaneous Daily  . latanoprost  1 drop Both Eyes QHS  . mouth rinse  15 mL Mouth Rinse 10 times per day  . midodrine  10 mg Per Tube BID WC  . multivitamin with minerals  1 tablet Per Tube Daily  . pantoprazole (PROTONIX) IV  40 mg Intravenous Q24H  . polyethylene glycol  17 g Per Tube Daily  . rocuronium  52 mg Intravenous Once  . senna-docusate  2 tablet Per Tube BID  . sodium chloride flush  10-40 mL Intracatheter Q12H  . sodium chloride flush  3 mL Intravenous Q12H  . sodium chloride flush  3 mL Intravenous Q12H  . timolol  1 drop Both Eyes BID  . voriconazole  200 mg Per Tube Q12H  . white petrolatum   Topical BID    Objective: Vital signs in last 24 hours: Temp:  [97.7 F (36.5 C)-98.8 F (37.1 C)] 97.7 F (36.5 C) (03/07 1500) Pulse Rate:  [70-99] 85 (03/07 1516) Resp:  [14-34] 25 (03/07 1516) BP:  (103-140)/(42-70) 109/42 (03/07 1516) SpO2:  [92 %-100 %] 94 % (03/07 1516) Arterial Line BP: (101-123)/(40-54) 108/40 (03/07 1200) FiO2 (%):  [40 %-50 %] 50 % (03/07 1516) Weight:  [61.8 kg] 61.8 kg (03/07 0500) Physical Exam  Constitutional: He is sedated. He appears chronically mal-nourished. No distress.  HENT:  Mouth/Throat: OETT in place Cardiovascular: Normal rate, regular rhythm and normal heart sounds. Exam reveals no gallop and no friction rub.  No murmur heard.  Pulmonary/Chest: Effort normal and breath sounds normal. No respiratory distress. He has no wheezes.  Abdominal: Soft. Bowel sounds are decresaed.He exhibits no distension. There is no tenderness.  Ext: muscle wasting Skin: Skin is warm and dry. No rash noted. No erythema.   Lab Results Recent Labs    02/28/20 0344 02/28/20 0344 02/29/20 0526 02/29/20 1140  WBC 41.0*  --  37.3*  --   HGB 8.0*   < > 6.7* 7.1*  HCT 25.3*   < > 21.2* 21.0*  NA 135   < > 137 136  K 5.3*   < > 5.0 4.9  CL 105  --  110  --   CO2 18*  --  20*  --   BUN 85*  --  86*  --   CREATININE 1.64*  --  1.50*  --    < > = values in this interval not displayed.   Liver Panel Recent Labs    02/27/20 0425 02/28/20 0344  PROT 5.1* 4.8*  ALBUMIN 1.4* 1.3*  AST 35 43*  ALT 31 26  ALKPHOS 111 116  BILITOT 0.5 0.5   Lab Results  Component Value Date   ESRSEDRATE 98 (H) 02/22/2020   Lab Results  Component Value Date   CRP 19.2 (H) 02/20/2020    Microbiology: 3/4 BAL - e.faecalis  Studies/Results: DG CHEST PORT 1 VIEW  Result Date: 02/28/2020 CLINICAL DATA:  Follow-up pneumonia. EXAM: PORTABLE CHEST 1 VIEW COMPARISON:  02/25/2020 FINDINGS: The endotracheal tube is in satisfactory position. Nasogastric tube extending into the stomach with its side hole in the proximal stomach. Right PICC tip at the superior cavoatrial junction. The cardiac silhouette remains near the upper limit of normal in size. Mild increase in patchy densities in  both lungs with stable underlying prominence of the interstitial markings. No definite pleural fluid seen. Thoracic and upper lumbar spine degenerative changes. IMPRESSION: 1. Mild increase in patchy densities in both lungs, suspicious for progressive pneumonia. 2. 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 Electronically Signed   By: Claudie Revering M.D.   On: 02/28/2020 14:03    Assessment/Plan: Complicated pneumonia- cavitary lesion =  Continue with amp/sub to cover broad spectrum oral flora as well e.faecalis isolated on BAL. Would also continue on voriconazole for presumed fungal infection  respiratory failure on ventilation = slowly improving.  Recommend getting CT neck to see if any endovascular infection associated with cavitary lesions, like fusarium.  Chronic illness from covid/current hospitalization = significant decondition. Continue to support with tube feeds.  [facility] for Infectious Diseases Cell: [phone] Pager: [phone]  02/29/2020, 3:45 PM

## 2020-03-01 ENCOUNTER — Inpatient Hospital Stay (HOSPITAL_COMMUNITY): Payer: Medicare Other

## 2020-03-01 DIAGNOSIS — R579 Shock, unspecified: Secondary | ICD-10-CM

## 2020-03-01 DIAGNOSIS — D62 Acute posthemorrhagic anemia: Secondary | ICD-10-CM

## 2020-03-01 DIAGNOSIS — G9341 Metabolic encephalopathy: Secondary | ICD-10-CM

## 2020-03-01 LAB — CBC
HCT: 21.9 % — ABNORMAL LOW (ref 39.0–52.0)
HCT: 28.8 % — ABNORMAL LOW (ref 39.0–52.0)
Hemoglobin: 6.8 g/dL — CL (ref 13.0–17.0)
Hemoglobin: 9.6 g/dL — ABNORMAL LOW (ref 13.0–17.0)
MCH: 28.3 pg (ref 26.0–34.0)
MCH: 29.4 pg (ref 26.0–34.0)
MCHC: 31.1 g/dL (ref 30.0–36.0)
MCHC: 33.3 g/dL (ref 30.0–36.0)
MCV: 91.3 fL (ref 80.0–100.0)
MCV: 88.1 fL (ref 80.0–100.0)
Platelets: 282 10*3/uL (ref 150–400)
Platelets: 293 10*3/uL (ref 150–400)
RBC: 2.4 MIL/uL — ABNORMAL LOW (ref 4.22–5.81)
RBC: 3.27 MIL/uL — ABNORMAL LOW (ref 4.22–5.81)
RDW: 21.4 % — ABNORMAL HIGH (ref 11.5–15.5)
RDW: 19.6 % — ABNORMAL HIGH (ref 11.5–15.5)
WBC: 38.9 10*3/uL — ABNORMAL HIGH (ref 4.0–10.5)
WBC: 36.9 10*3/uL — ABNORMAL HIGH (ref 4.0–10.5)
nRBC: 1.8 % — ABNORMAL HIGH (ref 0.0–0.2)
nRBC: 1.3 % — ABNORMAL HIGH (ref 0.0–0.2)

## 2020-03-01 LAB — QUANTIFERON-TB GOLD PLUS: QuantiFERON Incubation: UNDETERMINED

## 2020-03-01 LAB — GLUCOSE, CAPILLARY
Glucose-Capillary: 126 mg/dL — ABNORMAL HIGH (ref 70–99)
Glucose-Capillary: 155 mg/dL — ABNORMAL HIGH (ref 70–99)
Glucose-Capillary: 161 mg/dL — ABNORMAL HIGH (ref 70–99)
Glucose-Capillary: 180 mg/dL — ABNORMAL HIGH (ref 70–99)
Glucose-Capillary: 188 mg/dL — ABNORMAL HIGH (ref 70–99)
Glucose-Capillary: 202 mg/dL — ABNORMAL HIGH (ref 70–99)
Glucose-Capillary: 88 mg/dL (ref 70–99)
Glucose-Capillary: 88 mg/dL (ref 70–99)

## 2020-03-01 LAB — HEPARIN LEVEL (UNFRACTIONATED): Heparin Unfractionated: 0.32 IU/mL (ref 0.30–0.70)

## 2020-03-01 LAB — PREPARE RBC (CROSSMATCH)

## 2020-03-01 MED ORDER — SODIUM CHLORIDE 0.9% IV SOLUTION: Freq: Once | INTRAVENOUS | Status: AC

## 2020-03-01 NOTE — Progress Notes (Signed)
eLink Physician-Brief Progress Note Patient Name: Bruce Webb DOB: 12-Jul-1941 MRN: [id number]   Date of Service  03/01/2020  HPI/Events of Note  Anemia - Hgb = 6.8.  eICU Interventions  Will transfuse 1 unit PRBC.     Intervention Category Major Interventions: Other:  Lenell Antu 03/01/2020, 5:55 AM

## 2020-03-01 NOTE — Progress Notes (Signed)
Patient ID: Bruce Webb, male   DOB: 1941/01/13, 79 y.o.   MRN: [id number]  This NP visited patient at the bedside as a follow up to  yesterday's GOCs meeting.  79 year old male, who speaks [place], with history of HTN, HFpEF, DMT2, glaucoma with recent COVID 01/16/2020 with subsequent subacute PE on Eliquis and home O2 2L [place] who presented 2/21 with exertional dyspnea, black tarry stools, and worsening vision loss in his right eye.  Patient lives at home with his daughter and pre-COVID was independent in his ADLs.   Patient with recent hospitalization at [facility] for COVID after initial positive 1/22 for failed outpatient treatment for COVID infection despite monoclonal antibodies and steroids.  While at [facility], treated with remdesivir and decadron and found to have a PE and was discharged home on 2/6 on 2L O2 and Eliquis.   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    On admit, 02-15-20 found to have worsening anemia, 8.8-> 6 with positive fecal occult blood testing and hypotension responsive to fluid.  Additionally found to have WBC 28k, CXR with worsening multifocal airspace consolidation bilaterally.  He was started empirically on zithromax and cefepime. PCT 0.5.  He was transfused with 2 units PRBC, started on protonix, and GI consulted.   However, given tenuous respiratory status and increased O2 needs, GI deferred EGD.  Hgb trend since stable and therefore placed on heparin gtt on 2/23.  He was diuresed on 2/23 given fluid overload post blood transfusion with good output then but has since required midodrine to help with soft blood pressures.  On 2/25, he had worsening hypotension despite midodrine, ongoing elevated O2 needs, and transaminitis,   Intubated on 02-22-20.  Remains intubated with worsening chest CT.  Family face treatment option decisions, advanced directive decisions and anticipatory care needs.    Received  call from and spoke with nephew Dr Allena Katz  #[phone].  It is important that we clarify decision maker in this patient's treatment plan.  I left my cell phone number at bedside for daughter to call me when she arrives.  And nephew tells me that son will be arriving from out of the country in the next few days.  I discussed with nephew the current medical situation, the patient's high risk for decompensation and the importance of advanced care planning, specific to reintubation, and consideration of tracheostomy.  I stressed the importance of family meeting in order for all family members to have understanding of the current medical situation and establishing plan of care.  Discussed with family member  the importance of continued conversation with the family and the medical providers regarding overall plan of care and treatment options,  ensuring decisions are within the context of the patients values and GOCs.  Questions and concerns addressed   Discussed with bedside RN  Total time spent on the unit was 35 minutes    PMT will follow-up and continue to support holistically.  Greater than 50% of the time was spent in counseling and coordination of care  Lorinda Creed NP  Palliative Medicine Team Team Phone # [phone] Pager [phone]

## 2020-03-01 NOTE — Progress Notes (Signed)
ANTICOAGULATION CONSULT NOTE   Pharmacy Consult for Heparin Indication: Small subacute PE  No Known Allergies  Patient Measurements: Height: 5\' 2"  (157.5 cm) Weight: 137 lb 5.6 oz (62.3 kg) IBW/kg (Calculated) : 54.6 Heparin Dosing Weight: 52kg  Vital Signs: Temp: 98.3 F (36.8 C) (03/08 1010) Temp Source: Axillary (03/08 1010) BP: 130/59 (03/08 0800) Pulse Rate: 90 (03/08 1010)  Labs: Recent Labs    02/27/20 1705 02/28/20 0344 02/28/20 0344 02/29/20 0526 02/29/20 0526 02/29/20 1140 02/29/20 1548 03/01/20 0313  HGB  --  8.0*   < > 6.7*   < > 7.1*  --  6.8*  HCT  --  25.3*   < > 21.2*  --  21.0*  --  21.9*  PLT  --  265  --  267  --   --   --  282  APTT  --  60*  --   --   --   --   --   --   HEPARINUNFRC  --  0.48   < > 0.51  --   --  0.45 0.32  CREATININE 1.58* 1.64*  --  1.50*  --   --   --   --    < > = values in this interval not displayed.    Estimated Creatinine Clearance: 31.3 mL/min (A) (by C-G formula based on SCr of 1.5 mg/dL (H)).   Assessment: 79 y.o. male with 01/27/20 small subacute PE, PTA Eliquis on hold, last dose 2/21. Started on heparin with low goal for H/H drop requiring transfusion on admit, stable H/H and platelets x5 days. Switched back to Apixaban 2/26 >> 2/27, back to IV heparin on 2/28 due to intubation.   Heparin level is therapeutic.  Gum bleeding resolving per RN; brown old blood.  Getting transfusion for anemia.  Goal of Therapy:  Hep level 0.3 - 0.5 units/mL Monitor platelets by anticoagulation protocol: Yes   Plan:  Continue heparin gtt 600 units/hr Daily heparin level and CBC F/u bleeding  Maricruz Lucero D. 01-31-1982, PharmD, BCPS, BCCCP 03/01/2020, 10:49 AM

## 2020-03-01 NOTE — Progress Notes (Signed)
[facility] notified of pt's HGB=6.8. No new orders at this time. Continuing to monitor patient.

## 2020-03-01 NOTE — Progress Notes (Signed)
Patient transported to CT and back to 2M11 without any apparent complications.

## 2020-03-01 NOTE — Progress Notes (Addendum)
NAME:  Bruce Webb, MRN:  [id number], DOB:  06-17-1941, LOS: 23 ADMISSION DATE:  02/15/2020, CONSULTATION DATE:  02/19/2020 REFERRING MD:  Dr. Karleen Hampshire, CHIEF COMPLAINT:  Hypotension/ hypoxia    Brief History   79 year old male post COVID and PE in January on Eliquis admitted 2/21 to [facility] for extertional dyspnea found to be anemic with hemoccult positive stools.  GI consulted and transfused PRBC with since stabilized Hgb but has had persistent higher O2 requirements.  Was diuresed but today developed hypotension, with ongoing higher O2 requirements, PCCM was asked to consult.     History of present illness   HPI obtained via interpreter and from daughter.   79 year old male, who speaks [place], with history of HTN, HFpEF, DMT2, glaucoma with recent COVID 01/16/2020 with subsequent subacute PE on Eliquis and home O2 2L [place] who presented 2/21 with exertional dyspnea, black tarry stools, and worsening vision loss in his right eye.  Patient lives at home with his daughter and pre-COVID was independent in his ADLs.  He is a retired and prior worked in an office.  Denies any smoking or pulmonary history.  Reports prior to COVID, had a chronic non-productive cough for 10-15 years and his primary care MD treated it occasionally with cough medicine.   Patient with recent hospitalization at [facility] [place] after initial positive 1/22 for failed outpatient treatment for COVID infection despite monoclonal antibodies and steroids.  While at [facility], treated with remdesivir and decadron and found to have a PE and was discharged home on 2/6 on 2L O2 and Eliquis.   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    On admit, found to have worsening anemia, 8.8-> 6 with positive fecal occult blood testing and hypotension responsive to fluid.  Additionally found to have WBC 28k, CXR with worsening multifocal airspace consolidation bilaterally.  He was started  empirically on zithromax and cefepime. PCT 0.5.  He was transfused with 2 units PRBC, started on protonix, and GI consulted. However, given tenuous respiratory status and increased O2 needs, GI deferred EGD.  Hgb trend since stable and therefore placed on heparin gtt on 2/23.  He was diuresed on 2/23 given fluid overload post blood transfusion with good output then but has since required midodrine to help with soft blood pressures.  On 2/25, he had worsening hypotension despite midodrine, ongoing elevated O2 needs, and transaminitis, PCCM asked to consult.     Past Medical History  HTN, HFpEF, DMT2, glaucoma with progressive vision loss (left, now rapid loss in right) - recent COVID 01/16/2020 with subsequent PE on Eliquis and O2 2L [place]  Significant Hospital Events   2/21 admit. Stool ob =negatoive  2/23- echo ef 65%  2/25 PCCM consulted for hypotension and persistent hypoxemia  2/26 - Has severe desaturations with activity and eating. Required 10L while eating his meal today. No acute distress but continues to have shortness of breath. Denies chest pain.   2/27 -  -remains on 10 L.  He easily desaturates.  [name] at the bedside.  I spoke to her and subsequently also to their family friend who is in physician assistant in cardiology [place]).  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  According to the daughter he got worse with COVID-19 then got better went home but he declined again in terms of his hypoxemia.   Consults:  GI  PCCM  Procedures:   Significant Diagnostic Tests:  2/21 CTH >> 1. No acute intracranial abnormalities. 2. Mild cerebral atrophy with chronic microvascular ischemic changes in the cerebral white matter, as above.  CTA 01/27/20 - Diffuse subpleural/peripheral interstitial ground glass opacities with increased involvement in the the lower lobes bilaterally R>L. CXR 02/16/20 R>L multifocal pneumonia R>L, no edema or  effusion  2/23 TTE >> 1. Left ventricular ejection fraction, by estimation, is 60 to 65%. The left ventricle has normal function. The left ventricle has no regional wall motion abnormalities. Left ventricular diastolic parameters are consistent with Grade II diastolic dysfunction (pseudonormalization).  2. The history indicates the patient has a pulmonary embolus. The RV is  not dilated and there is normal RV function. . Right ventricular systolic function is low normal. The right ventricular size is normal.  3. The mitral valve is normal in structure and function. Mild mitral valve regurgitation. No evidence of mitral stenosis.  4. The aortic valve is normal in structure and function. Aortic valve regurgitation is not visualized. No aortic stenosis is present.   Micro Data:  2/21 BCx 2 >> ngtd x 4 days 2/25 - CCP 2/27 - quant gold -  2/27 RVP - negative 2/28  - urine strep - negative 228 - urine leg -  2/28 - aspergillus Ab -  xxxxxxxxxxxx 3/1 BAL Cx, fungal, acid fast >> negative 3/1 BAL PCP>> negative 3/ blood cultures - ngtd Antimicrobials:  2/21 azithro>>2/26 2/21 ceftriaxone  2/22 cefepime >> 2/28 Bactrim >3/4 3/1 Flagyl> 3/2 Voriconazole >> 3/4 Zyvox  Interim history/subjective:  Off sedation this morning.  Very tachypneic.  On pressure control this morning.  Objective   Blood pressure (!) 128/54, pulse 87, temperature 97.8 F (36.6 C), temperature source Axillary, resp. rate (!) 30, height _0  (1.575 m), weight 62.3 kg, SpO2 97 %.    Vent Mode: PCV FiO2 (%):  [40 %-50 %] 40 % Set Rate:  [28 bmp] 28 bmp PEEP:  [5 cmH20] 5 cmH20 Pressure Support:  [5 cmH20] 5 cmH20 Plateau Pressure:  [27 cmH20-36 cmH20] 36 cmH20   Intake/Output Summary (Last 24 hours) at 03/01/2020 1447 Last data filed at 03/01/2020 1226 Gross per 24 hour  Intake 2340.99 ml  Output 1475 ml  Net 865.99 ml   Filed Weights   02/28/20 0300 02/29/20 0500 03/01/20 0312  Weight: 59.7 kg 61.8 kg  62.3 kg   Physical Exam: General: Chronically ill-appearing, no acute distress HENT: [place], AT, ETT in place Eyes: EOMI, no scleral icterus Respiratory: Clear to auscultation bilaterally.  No crackles, wheezing or rales Cardiovascular: RRR, -M/R/G, no JVD GI: BS+, soft, nontender Extremities:-Edema,-tenderness Neuro: Opens eyes to voice, grimaces to pain, moves extremities x 4 GU: Foley in place  Resolved Hospital Problem list   Transaminitis -resolved.worsening likely in setting of shock. RUQ unrevealing  Assessment & Plan:  79 year old male never smoker with prior COVID-19 infection diagnosed in January and requiring hospitalization from 2/1-2/6, recently diagnosed pulmonary emboli on anticoagulation and end-stage glaucoma with right vision loss in addition to his known left vision loss who presents with worsening shortness of breath and admitted for acute on chronic respiratory failure secondary to multifocal pneumonia. Has had required high O2 requirements during the course of this hospitalization despite steroid therapy and transferred to the ICU on 2/28.  ARDS/Acute hypoxemic respiratory failure secondary to cavitary pneumonia and post COVID lung disease requiring mechanical ventilation. -Very tachypneic, unable to pass SBT due to this.  Also  having high pressure control requirements  Cavitary pneumonia of unclear etiology. Most likely bacterial - anaerobic from oral organisms. Relative immunosuppression from [place] may have led to aspergillus. TB negative. -Continue Vori for + aspergillus ag. Continue Amp-sulbactam and Flagyl.  Appreciate ID input -Pete CT chest demonstrates redemonstration of this cavitary lesion, as well as worsening airspace opacities bilateral upper lobes. -Given loss of vision in right eye, Fusobacterium as a cause of lung abscess per previous discussion with ID.  Agitation requiring sedation -Full sedation vacation.  Avoid any sedation unless gas exchange  impaired. -We will trial as needed and low-dose Precedex.  AKI with hyperkalemia --Gentle diuresis --Monitor UOP/Cr -Avoiding contrast unless absolutely necessary.  Small bilateral pulmonary emboli which may represent thrombosis in situ from pneumonia. --Continue heparin gtt --Trend CBC with hx of melena prior to admission  Acute blood loss anemia, melena. S/p 2U PRBC. Hg stable with no further transfusions --GI consulted on admission but unable to perform EGD due to respiratory status --Trend CBC --PPI daily  Hyponatremia --Trend BMP daily  Hyperglycemia -SSI -Increase Lantus  Chronic diastolic heart failure -Gentle diuresis  Acute urinary retention 2/27 -Foley in place   Daily Goals Checklist  Pain/Anxiety/Delirium protocol (if indicated):  Low-dose Precedex, only if needed. VAP protocol (if indicated): bundle in place. Respiratory support goals: SBT.  Extubate once mental status allows.  He almost certainly will require tracheostomy. Blood pressure target: MAP>65, off NE DVT prophylaxis: IV heparin Nutrition Status: Nutrition Problem: Inadequate oral intake Etiology: altered GI function Signs/Symptoms: NPO status Interventions: Tube feeding GI prophylaxis: pantoprazole. Fluid status goals: Gentle diuresis to keep even Urinary catheter: Condom. Central line: PICC Glucose control: SSI - euglycemic  Mobility/therapy needs: bed rest Antibiotic de-escalation: continue current antibiotics. Home medication reconciliation: n/a Daily labs: cbc, bmp Code Status: full Family Communication: updated son and daughter over the phone in [place] today. Will proceed with tracheostomy this week.  Disposition: ICU.   LABS   Recent Labs  Lab 02/25/20 1546 02/26/20 0413 02/26/20 1225 02/27/20 0820 02/29/20 1140  PHART 7.382 7.372 7.401 7.315* 7.361  PCO2ART 32.1 32.6 29.6* 34.1 34.1  PO2ART 54.0* 61.0* 61.0* 57.0* 54.0*  HCO3 19.1* 19.1* 18.5* 17.5* 19.3*  TCO2 20*  20* 19* 19* 20*  O2SAT 88.0 91.0 92.0 88.0 87.0    CBC Recent Labs  Lab 02/28/20 0344 02/28/20 0344 02/29/20 0526 02/29/20 1140 03/01/20 0313  HGB 8.0*   < > 6.7* 7.1* 6.8*  HCT 25.3*   < > 21.2* 21.0* 21.9*  WBC 41.0*  --  37.3*  --  38.9*  PLT 265  --  267  --  282   < > = values in this interval not displayed.    COAGULATION No results for input(s): INR in the last 168 hours.  CARDIAC  No results for input(s): TROPONINI in the last 168 hours. No results for input(s): PROBNP in the last 168 hours.   CHEMISTRY Recent Labs  Lab 02/26/20 0440 02/26/20 1225 02/27/20 0425 02/27/20 0425 02/27/20 0820 02/27/20 0820 02/27/20 1705 02/27/20 1705 02/28/20 0344 02/28/20 0344 02/29/20 0526 02/29/20 1140  NA 129*   < > 131*   < > 130*  --  131*  --  135  --  137 136  K 5.1   < > 5.4*   < > 5.3*   < > 4.9   < > 5.3*   < > 5.0 4.9  CL 102  --  102  --   --   --  105  --  105  --  110  --   CO2 18*  --  19*  --   --   --  19*  --  18*  --  20*  --   GLUCOSE 187*  --  172*  --   --   --  149*  --  129*  --  122*  --   BUN 63*  --  75*  --   --   --  81*  --  85*  --  86*  --   CREATININE 1.50*  --  1.50*  --   --   --  1.58*  --  1.64*  --  1.50*  --   CALCIUM 7.2*  --  7.5*  --   --   --  7.4*  --  7.5*  --  7.4*  --    < > = values in this interval not displayed.   Estimated Creatinine Clearance: 31.3 mL/min (A) (by C-G formula based on SCr of 1.5 mg/dL (H)).   LIVER Recent Labs  Lab 02/24/20 0301 02/25/20 0428 02/26/20 0440 02/27/20 0425 02/28/20 0344  AST 87* 22 26 35 43*  ALT 102* 40 32 31 26  ALKPHOS 239* 138* 123 111 116  BILITOT 0.5 0.4 0.5 0.5 0.5  PROT 8.6* 5.0* 4.9* 5.1* 4.8*  ALBUMIN 1.9* 1.3* 1.3* 1.4* 1.3*     INFECTIOUS Recent Labs  Lab 02/25/20 1315 02/25/20 1644  LATICACIDVEN 1.6 1.8     ENDOCRINE CBG (last 3)  Recent Labs    03/01/20 0353 03/01/20 0723 03/01/20 1132  GLUCAP 126* 88 161*   CRITICAL CARE The patient is  critically ill with multiple organ systems failure and requires high complexity decision making for assessment and support, frequent evaluation and titration of therapies, application of advanced monitoring technologies and extensive interpretation of multiple databases.   Critical Care Time devoted to patient care services described in this note is 47 minutes. This time reflects time of care of this [place] . This critical care time does not reflect separately billable procedures or procedure time, teaching time or supervisory time of PA/NP/Med student/Med Resident etc but could involve care discussion time.  [facility] 03/01/2020 2:48 PM  Pager: [phone] After hours pager: [phone]

## 2020-03-01 NOTE — Progress Notes (Signed)
[place] [facility] for Infectious Disease    Date of Admission:  02/15/2020   Total days of antibiotics:        Day 6 voriconazole        Day 4 amp/sub           ID: Orbin Mayeux is a 79 y.o. male with hx of severe COVID with respiratory distress s/p vent, found to have cavitary pneumonia and vision loss, difficult vent wean Principal Problem:   Acute GI bleeding Active Problems:   Essential hypertension   Pulmonary embolism (HCC)   Pneumonia with cavity of lung   Hyponatremia   Hyperglycemia   Pressure injury of skin   Goals of care, counseling/discussion   Palliative care by specialist   Multifocal pneumonia   Cavitary pneumonia   Palliative care encounter    Subjective: Off of sedation x 24hr, with more spontaneous movement of arms and legs, was on pressure support up until midnight.   Medications:  . brimonidine  1 drop Both Eyes BID  . chlorhexidine gluconate (MEDLINE KIT)  15 mL Mouth Rinse BID  . Chlorhexidine Gluconate Cloth  6 each Topical Daily  . dorzolamide  1 drop Both Eyes BID  . insulin aspart  0-15 Units Subcutaneous Q4H  . insulin aspart  6 Units Subcutaneous Q4H  . insulin glargine  20 Units Subcutaneous Daily  . latanoprost  1 drop Both Eyes QHS  . mouth rinse  15 mL Mouth Rinse 10 times per day  . midodrine  10 mg Per Tube BID WC  . multivitamin with minerals  1 tablet Per Tube Daily  . pantoprazole (PROTONIX) IV  40 mg Intravenous Q24H  . polyethylene glycol  17 g Per Tube Daily  . rocuronium  52 mg Intravenous Once  . senna-docusate  2 tablet Per Tube BID  . sodium chloride flush  10-40 mL Intracatheter Q12H  . sodium chloride flush  3 mL Intravenous Q12H  . sodium chloride flush  3 mL Intravenous Q12H  . timolol  1 drop Both Eyes BID  . voriconazole  200 mg Per Tube Q12H  . white petrolatum   Topical BID    Objective: Vital signs in last 24 hours: Temp:  [97.1 F (36.2 C)-98.9 F (37.2 C)] 98.9 F (37.2 C) (03/08 0949) Pulse Rate:   [79-116] 90 (03/08 0949) Resp:  [17-39] 39 (03/08 0949) BP: (91-144)/(42-59) 130/59 (03/08 0800) SpO2:  [89 %-100 %] 100 % (03/08 0949) Arterial Line BP: (101-153)/(38-54) 144/54 (03/08 0949) FiO2 (%):  [40 %-50 %] 40 % (03/08 0949) Weight:  [62.3 kg] 62.3 kg (03/08 0312) Physical Exam  Constitutional: His eyes are open but does not track(history of visual impairment). He appears  Chronically ill and mal-nourished. No distress.  HENT: pallor Mouth/Throat: OETT in place. Dry oral mucosa No oropharyngeal exudate.  Cardiovascular: Normal rate, regular rhythm and normal heart sounds. Exam reveals no gallop and no friction rub.  No murmur heard.  Pulmonary/Chest: Effort normal and breath sounds normal. No respiratory distress. He has no wheezes.  Abdominal: Soft. Bowel sounds are normal. He exhibits no distension. There is no tenderness.  Lymphadenopathy:  He has no cervical adenopathy.  Neurological: He spontaneously moves hands and left foot,  Skin: Skin is warm and dry. No rash noted. No erythema.      Lab Results Recent Labs    02/28/20 0344 02/28/20 0344 02/29/20 0526 02/29/20 0526 02/29/20 1140 03/01/20 0313  WBC 41.0*   < > 37.3*  --   --  38.9*  HGB 8.0*   < > 6.7*   < > 7.1* 6.8*  HCT 25.3*   < > 21.2*   < > 21.0* 21.9*  NA 135   < > 137  --  136  --   K 5.3*   < > 5.0  --  4.9  --   CL 105  --  110  --   --   --   CO2 18*  --  20*  --   --   --   BUN 85*  --  86*  --   --   --   CREATININE 1.64*  --  1.50*  --   --   --    < > = values in this interval not displayed.   Liver Panel Recent Labs    02/28/20 0344  PROT 4.8*  ALBUMIN 1.3*  AST 43*  ALT 26  ALKPHOS 116  BILITOT 0.5   +aspergillus ab but antigen negative Microbiology: Aspirate - e.faecalis and c.lusitanea Studies/Results: DG CHEST PORT 1 VIEW  Result Date: 02/28/2020 CLINICAL DATA:  Follow-up pneumonia. EXAM: PORTABLE CHEST 1 VIEW COMPARISON:  02/25/2020 FINDINGS: The endotracheal tube is in  satisfactory position. Nasogastric tube extending into the stomach with its side hole in the proximal stomach. Right PICC tip at the superior cavoatrial junction. The cardiac silhouette remains near the upper limit of normal in size. Mild increase in patchy densities in both lungs with stable underlying prominence of the interstitial markings. No definite pleural fluid seen. Thoracic and upper lumbar spine degenerative changes. IMPRESSION: 1. Mild increase in patchy densities in both lungs, suspicious for progressive pneumonia. 2. 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 Electronically Signed   By: Claudie Revering M.D.   On: 02/28/2020 14:03     Assessment/Plan: History of loss of vision plus less spontaneous movement to right leg (since off of sedation) = consider getting mri brain to see if any evidence of septic emboli that would explain neuro findings. CT of head was non revealing  Cavitary pneumonia = empirically treated for asperigillus with voriconazole(given that he was treated with steroids and toci - for immunosuppression. BAL no fungal elements, did not undergo tissue culture/biopsy due to being on anticoagulation and so labile with quick desaturation on vent. Please get repeat chest CT to see if cavitary lesion is decreased in size. Still has antibacterial coverage with amp/sub to cover mouthflora bacteria as possible source of infection.  E.faecalis on respiratory culture = currently day 3, treat for 7 days with amp/sub  Leukocytosis = still elevated despite steroids stopped yesterday. Will continue to monitor. Please add differential  Condition still guarded. Slightly improved  [facility] Cell: [phone] Pager: [phone]  03/01/2020, 9:55 AM

## 2020-03-02 DIAGNOSIS — G9340 Encephalopathy, unspecified: Secondary | ICD-10-CM

## 2020-03-02 DIAGNOSIS — Z9911 Dependence on respirator [ventilator] status: Secondary | ICD-10-CM

## 2020-03-02 DIAGNOSIS — J17 Pneumonia in diseases classified elsewhere: Secondary | ICD-10-CM

## 2020-03-02 DIAGNOSIS — U071 COVID-19: Secondary | ICD-10-CM

## 2020-03-02 DIAGNOSIS — B49 Unspecified mycosis: Secondary | ICD-10-CM

## 2020-03-02 DIAGNOSIS — I2699 Other pulmonary embolism without acute cor pulmonale: Secondary | ICD-10-CM

## 2020-03-02 LAB — CBC
HCT: 28 % — ABNORMAL LOW (ref 39.0–52.0)
Hemoglobin: 9.1 g/dL — ABNORMAL LOW (ref 13.0–17.0)
MCH: 29.4 pg (ref 26.0–34.0)
MCHC: 32.5 g/dL (ref 30.0–36.0)
MCV: 90.3 fL (ref 80.0–100.0)
Platelets: 261 10*3/uL (ref 150–400)
RBC: 3.1 MIL/uL — ABNORMAL LOW (ref 4.22–5.81)
RDW: 20.2 % — ABNORMAL HIGH (ref 11.5–15.5)
WBC: 33.9 10*3/uL — ABNORMAL HIGH (ref 4.0–10.5)
nRBC: 1 % — ABNORMAL HIGH (ref 0.0–0.2)

## 2020-03-02 LAB — GLUCOSE, CAPILLARY
Glucose-Capillary: 110 mg/dL — ABNORMAL HIGH (ref 70–99)
Glucose-Capillary: 125 mg/dL — ABNORMAL HIGH (ref 70–99)
Glucose-Capillary: 146 mg/dL — ABNORMAL HIGH (ref 70–99)
Glucose-Capillary: 201 mg/dL — ABNORMAL HIGH (ref 70–99)
Glucose-Capillary: 203 mg/dL — ABNORMAL HIGH (ref 70–99)
Glucose-Capillary: 238 mg/dL — ABNORMAL HIGH (ref 70–99)

## 2020-03-02 LAB — BPAM RBC
Blood Product Expiration Date: 202104032359
ISSUE DATE / TIME: 202103080934
Unit Type and Rh: 5100

## 2020-03-02 LAB — HEPARIN LEVEL (UNFRACTIONATED): Heparin Unfractionated: 0.28 IU/mL — ABNORMAL LOW (ref 0.30–0.70)

## 2020-03-02 LAB — TYPE AND SCREEN
ABO/RH(D): O POS
Antibody Screen: NEGATIVE
Unit division: 0

## 2020-03-02 MED ORDER — POLYETHYLENE GLYCOL 3350 17 G PO PACK: 17.0000 g | PACK | Freq: Every day | ORAL | Status: DC | PRN

## 2020-03-02 NOTE — Progress Notes (Signed)
Physical Therapy Treatment Patient Details Name: Bruce Webb MRN: [id number] DOB: 06-22-1941 Today's Date: 03/02/2020    History of Present Illness 79 y.o. gentleman prior history of essential hypertension, type 2 diabetes mellitus, recent COVID-19 illness initially diagnosed on 01/16/2020. Patient was discharged from hospital on recent discharge from the hospital on 01/31/2020 on 2L of nasal cannula oxygen and had been found to have pulmonary embolism. 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 He presented to the emergency department with exertional dyspnea, melena, and progressive vision loss involving the right eye. Chest x-ray of 2/22: asymmetric airspace disease, right greater than left. WBC increased on 2/24; afebrile.  Intubated 2/28 due to decline in medical status. TB has been ruled out.    PT Comments    Pt remained on vent and per nurse only bed level treat at this time.  Pt was able to follow increased commands and provided assistance with ROM exercises L stronger than R.  Transitioned pt to chair mode for 5 mins and worked on lifting trunk off bed.  Pt's VSS and no signs of distress, but did have blood come out of mouth when in chair mode (RN notified and reports he has been doing this).   Follow Up Recommendations  LTACH;Supervision/Assistance - 24 hour     Equipment Recommendations  Other (comment)(TBD)    Recommendations for Other Services       Precautions / Restrictions Precautions Precautions: Fall Precaution Comments: watch O2    Mobility  Bed Mobility               General bed mobility comments: Nurse agreed to bed level only treat at this time.  Did transition pt to chair mode for ~5 minutes then returned to supine with HOB at 45 degrees.  Transfers                    Ambulation/Gait                 Stairs             Wheelchair Mobility    Modified Rankin (Stroke Patients Only)       Balance                                             Cognition Arousal/Alertness: Awake/alert Behavior During Therapy: WFL for tasks assessed/performed Overall Cognitive Status: Difficult to assess                                 General Comments: Pt was able to follow ~50% multimodal simple commands for ROM (did not use interpreter , no family present, only worked on bed exercise)      Exercises General Exercises - Upper Extremity Shoulder Flexion: AAROM;Both;10 reps;Supine General Exercises - Lower Extremity Quad Sets: AROM;10 reps;Both;Supine(increased cues) Short Arc Quad: AAROM;Both;10 reps;Other (comment)(chair position of bed) Heel Slides: AAROM;10 reps;Both;Supine Other Exercises Other Exercises: heel cord stretch bil 30 sec Other Exercises: pulling trunk up off bed in chair mode x 2 with max x 2    General Comments General comments (skin integrity, edema, etc.): VSS with vent on 40% FiO2 and PEEP 5 w/ PS CPAP setting.      Pertinent Vitals/Pain Pain Assessment: No/denies pain    Home Living  Prior Function            PT Goals (current goals can now be found in the care plan section) Progress towards PT goals: Progressing toward goals    Frequency    Min 2X/week      PT Plan Current plan remains appropriate    Co-evaluation              AM-PAC PT "6 Clicks" Mobility   Outcome Measure  Help needed turning from your back to your side while in a flat bed without using bedrails?: Total Help needed moving from lying on your back to sitting on the side of a flat bed without using bedrails?: Total Help needed moving to and from a bed to a chair (including a wheelchair)?: Total Help needed standing up from a chair using your arms (e.g., wheelchair or bedside chair)?: Total Help needed to walk in hospital room?: Total Help needed climbing 3-5 steps with a railing? : Total 6 Click Score: 6    End of Session  Equipment Utilized During Treatment: Other (comment)(VENT) Activity Tolerance: Patient tolerated treatment well Patient left: in bed;with call bell/phone within reach;with bed alarm set Nurse Communication: Mobility status;Need for lift equipment(pt coughed up blood when in chair mode - RN aware) PT Visit Diagnosis: Unsteadiness on feet (R26.81);Other abnormalities of gait and mobility (R26.89);Muscle weakness (generalized) (M62.81)     Time: 9604-5409 PT Time Calculation (min) (ACUTE ONLY): 22 min  Charges:  $Therapeutic Exercise: 8-22 mins                     Royetta Asal, PT Acute Rehab Services Pager [phone] [facility] [phone] [facility] [phone]    Rayetta Humphrey 03/02/2020, 12:00 PM

## 2020-03-02 NOTE — Progress Notes (Signed)
ANTICOAGULATION CONSULT NOTE   Pharmacy Consult for Heparin Indication: Small subacute PE  No Known Allergies  Patient Measurements: Height: 5\' 2"  (157.5 cm) Weight: 139 lb 1.8 oz (63.1 kg) IBW/kg (Calculated) : 54.6 Heparin Dosing Weight: 52kg  Vital Signs: Temp: 97.6 F (36.4 C) (03/09 0350) Temp Source: Oral (03/09 0350) BP: 117/59 (03/09 0600) Pulse Rate: 84 (03/09 0600)  Labs: Recent Labs    02/29/20 0526 02/29/20 0526 02/29/20 1140 02/29/20 1548 03/01/20 0313 03/01/20 0313 03/01/20 2044 03/02/20 0458  HGB 6.7*   < >   < >  --  6.8*   < > 9.6* 9.1*  HCT 21.2*   < >   < >  --  21.9*  --  28.8* 28.0*  PLT 267   < >  --   --  282  --  293 261  HEPARINUNFRC 0.51  --   --  0.45 0.32  --   --  0.28*  CREATININE 1.50*  --   --   --   --   --   --   --    < > = values in this interval not displayed.    Estimated Creatinine Clearance: 31.3 mL/min (A) (by C-G formula based on SCr of 1.5 mg/dL (H)).   Assessment: 79 y.o. male with 01/27/20 small subacute PE, PTA Eliquis on hold, last dose 2/21. Started on heparin with low goal for H/H drop requiring transfusion on admit, stable H/H and platelets x5 days. Switched back to Apixaban 2/26 >> 2/27, back to IV heparin on 2/28 due to intubation.   Heparin level is slightly sub-therapeutic today; no issue with heparin infusion per RN.  Gum bleeding resolving per RN; brown old blood.    Goal of Therapy:  Hep level 0.3 - 0.5 units/mL Monitor platelets by anticoagulation protocol: Yes   Plan:  Increase heparin gtt to 650 units/hr Daily heparin level and CBC Monitor for bleeding  Gean Laursen D. 01-31-1983, PharmD, BCPS, BCCCP 03/02/2020, 7:28 AM

## 2020-03-02 NOTE — Progress Notes (Signed)
NAME:  Bruce Webb, MRN:  [id number], DOB:  July 02, 1941, LOS: 11 ADMISSION DATE:  02/15/2020, CONSULTATION DATE:  02/19/2020 REFERRING MD:  Dr. Karleen Hampshire, CHIEF COMPLAINT:  Hypotension/ hypoxia    Brief History   79 year old male post COVID and PE in January on Eliquis admitted 2/21 to [facility] for extertional dyspnea found to be anemic with hemoccult positive stools.  GI consulted and transfused PRBC with since stabilized Hgb but has had persistent higher O2 requirements.  Was diuresed but today developed hypotension, with ongoing higher O2 requirements, PCCM was asked to consult.     History of present illness   HPI obtained via interpreter and from Bruce.   79 year old male, who speaks [place], with history of HTN, HFpEF, DMT2, glaucoma with recent COVID 01/16/2020 with subsequent subacute PE on Eliquis and home O2 2L [place] who presented 2/21 with exertional dyspnea, black tarry stools, and worsening vision loss in his right eye.  Patient lives at home with his Bruce and pre-COVID was independent in his ADLs.  He is a retired and prior worked in an office.  Denies any smoking or pulmonary history.  Reports prior to COVID, had a chronic non-productive cough for 10-15 years and his primary care MD treated it occasionally with cough medicine.   Patient with recent hospitalization at [facility] for [place] after initial positive 1/22 for failed outpatient treatment for COVID infection despite monoclonal antibodies and steroids.  While at [facility], treated with remdesivir and decadron and found to have a PE and was discharged home on 2/6 on 2L O2 and Eliquis.   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    On admit, found to have worsening anemia, 8.8-> 6 with positive fecal occult blood testing and hypotension responsive to fluid.  Additionally found to have WBC 28k, CXR with worsening multifocal airspace consolidation bilaterally.  He was started  empirically on zithromax and cefepime. PCT 0.5.  He was transfused with 2 units PRBC, started on protonix, and GI consulted. However, given tenuous respiratory status and increased O2 needs, GI deferred EGD.  Hgb trend since stable and therefore placed on heparin gtt on 2/23.  He was diuresed on 2/23 given fluid overload post blood transfusion with good output then but has since required midodrine to help with soft blood pressures.  On 2/25, he had worsening hypotension despite midodrine, ongoing elevated O2 needs, and transaminitis, PCCM asked to consult.     Past Medical History  HTN, HFpEF, DMT2, glaucoma with progressive vision loss (left, now rapid loss in right) - recent COVID 01/16/2020 with subsequent PE on Eliquis and O2 2L [place]  Significant Hospital Events   2/21 admit. Stool ob =negatoive  2/23- echo ef 65%  2/25 PCCM consulted for hypotension and persistent hypoxemia  2/26 - Has severe desaturations with activity and eating. Required 10L while eating his meal today. No acute distress but continues to have shortness of breath. Denies chest pain.   2/27 -  -remains on 10 L.  He easily desaturates.  Bruce Webb at the bedside.  I spoke to her and subsequently also to their family friend who is in physician assistant in cardiology [place]).  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  According to the Bruce he got worse with COVID-19 then got better went home but he declined again in terms of his hypoxemia.   Consults:  GI  PCCM  Procedures:   Significant Diagnostic Tests:  2/21 CTH >> 1. No acute intracranial abnormalities. 2. Mild cerebral atrophy with chronic microvascular ischemic changes in the cerebral white matter, as above.  CTA 01/27/20 - Diffuse subpleural/peripheral interstitial ground glass opacities with increased involvement in the the lower lobes bilaterally R>L. CXR 02/16/20 R>L multifocal pneumonia R>L, no edema or  effusion  2/23 TTE >> 1. Left ventricular ejection fraction, by estimation, is 60 to 65%. The left ventricle has normal function. The left ventricle has no regional wall motion abnormalities. Left ventricular diastolic parameters are consistent with Grade II diastolic dysfunction (pseudonormalization).  2. The history indicates the patient has a pulmonary embolus. The RV is  not dilated and there is normal RV function. . Right ventricular systolic function is low normal. The right ventricular size is normal.  3. The mitral valve is normal in structure and function. Mild mitral valve regurgitation. No evidence of mitral stenosis.  4. The aortic valve is normal in structure and function. Aortic valve regurgitation is not visualized. No aortic stenosis is present.   Micro Data:  2/21 BCx 2 >> ngtd x 4 days 2/25 - CCP 2/27 - quant gold -  2/27 RVP - negative 2/28  - urine strep - negative 228 - urine leg -  2/28 - aspergillus Ab -  xxxxxxxxxxxx 3/1 BAL Cx, fungal, acid fast >> negative 3/1 BAL PCP>> negative 3/ blood cultures - ngtd Antimicrobials:  2/21 azithro>>2/26 2/21 ceftriaxone  2/22 cefepime >> 2/28 Bactrim >3/4 3/1 Flagyl> 3/2 Voriconazole >> 3/4 Zyvox  Interim history/subjective:  On low-dose Precedex.  Tolerating SBT.  Resting comfortably.  Objective   Blood pressure 130/62, pulse (!) 57, temperature 97.6 F (36.4 C), temperature source Oral, resp. rate (!) 27, height _0  (1.575 m), weight 63.1 kg, SpO2 96 %.    Vent Mode: CPAP;PSV FiO2 (%):  [40 %] 40 % Set Rate:  [28 bmp] 28 bmp PEEP:  [5 cmH20] 5 cmH20 Pressure Support:  [5 cmH20] 5 cmH20 Plateau Pressure:  [25 cmH20-36 cmH20] 25 cmH20   Intake/Output Summary (Last 24 hours) at 03/02/2020 1328 Last data filed at 03/02/2020 1200 Gross per 24 hour  Intake 1577.69 ml  Output 800 ml  Net 777.69 ml   Filed Weights   02/29/20 0500 03/01/20 0312 03/02/20 0456  Weight: 61.8 kg 62.3 kg 63.1 kg   Physical  Exam: General: Chronically ill-appearing, no acute distress HENT: [place], AT, ETT in place Eyes: EOMI, no scleral icterus Respiratory: Clear to auscultation bilaterally.  No crackles, wheezing or rales Cardiovascular: RRR, -M/R/G, no JVD GI: BS+, soft, nontender Extremities:-Edema,-tenderness Neuro: Opens eyes to voice, grimaces to pain, moves extremities x 4 GU: Foley in place  Resolved Hospital Problem list   Transaminitis -resolved.worsening likely in setting of shock. RUQ unrevealing  Assessment & Plan:  79 year old male never smoker with prior COVID-19 infection diagnosed in January and requiring hospitalization from 2/1-2/6, recently diagnosed pulmonary emboli on anticoagulation and end-stage glaucoma with right vision loss in addition to his known left vision loss who presents with worsening shortness of breath and admitted for acute on chronic respiratory failure secondary to multifocal pneumonia. Has had required high O2 requirements during the course of this hospitalization despite steroid therapy and transferred to the ICU on 2/28.  ARDS/Acute hypoxemic respiratory failure secondary to cavitary pneumonia and post COVID lung disease requiring mechanical ventilation. -Daily SBT and SAT.  He is passing today.  But I think mental status and  strength preclude extubation.  Cavitary pneumonia of unclear etiology. Most likely bacterial - anaerobic from oral organisms. Relative immunosuppression from [place] may have led to aspergillus. TB negative. -Continue Vori for + aspergillus ag. Continue Amp-sulbactam and Flagyl.  Appreciate ID input -repeat chest demonstrates redemonstration of this cavitary lesion, as well as worsening airspace opacities bilateral upper lobes. -Given loss of vision in right eye, Fusobacterium as a cause of lung abscess per previous discussion with ID.  Agitation requiring sedation -Full sedation vacation.  Avoid any sedation unless gas exchange impaired. -We will  trial as needed and low-dose Precedex.  AKI with hyperkalemia --Gentle diuresis --Monitor UOP/Cr -Avoiding contrast unless absolutely necessary.  Small bilateral pulmonary emboli which may represent thrombosis in situ from pneumonia. --Continue heparin gtt --Trend CBC with hx of melena prior to admission  Acute blood loss anemia, melena.  S/p 2U PRBC. Hg stable with no further transfusions --GI consulted on admission but unable to perform EGD due to respiratory status --Trend CBC --PPI daily  Hyponatremia --Trend BMP daily  Hyperglycemia -SSI -Increase Lantus  Chronic diastolic heart failure -Gentle diuresis  Acute urinary retention 2/27 -Foley in place   Daily Goals Checklist  Pain/Anxiety/Delirium protocol (if indicated):  Low-dose Precedex, only if needed. VAP protocol (if indicated): bundle in place. Respiratory support goals: SBT.  Extubate once mental status allows.  He almost certainly will require tracheostomy. DVT prophylaxis: IV heparin Nutrition: tube feeds, at goal GI prophylaxis: pantoprazole. Urinary catheter: Condom. Central line: PICC Glucose control: SSI - euglycemic  Mobility: PT/OT consulted, advance as tolerated Code Status: full Family Communication: updated son and Bruce over the phone in [place] today. Will proceed with tracheostomy this week.  Patient's family wishes to wait until Saturday after speaking with their cousin who is a PA. Disposition: ICU.   LABS   Recent Labs  Lab 02/25/20 1546 02/26/20 0413 02/26/20 1225 02/27/20 0820 02/29/20 1140  PHART 7.382 7.372 7.401 7.315* 7.361  PCO2ART 32.1 32.6 29.6* 34.1 34.1  PO2ART 54.0* 61.0* 61.0* 57.0* 54.0*  HCO3 19.1* 19.1* 18.5* 17.5* 19.3*  TCO2 20* 20* 19* 19* 20*  O2SAT 88.0 91.0 92.0 88.0 87.0    CBC Recent Labs  Lab 03/01/20 0313 03/01/20 2044 03/02/20 0458  HGB 6.8* 9.6* 9.1*  HCT 21.9* 28.8* 28.0*  WBC 38.9* 36.9* 33.9*  PLT 282 293 261    COAGULATION No  results for input(s): INR in the last 168 hours.  CARDIAC  No results for input(s): TROPONINI in the last 168 hours. No results for input(s): PROBNP in the last 168 hours.   CHEMISTRY Recent Labs  Lab 02/26/20 0440 02/26/20 1225 02/27/20 0425 02/27/20 0425 02/27/20 0820 02/27/20 0820 02/27/20 1705 02/27/20 1705 02/28/20 0344 02/28/20 0344 02/29/20 0526 02/29/20 1140  NA 129*   < > 131*   < > 130*  --  131*  --  135  --  137 136  K 5.1   < > 5.4*   < > 5.3*   < > 4.9   < > 5.3*   < > 5.0 4.9  CL 102  --  102  --   --   --  105  --  105  --  110  --   CO2 18*  --  19*  --   --   --  19*  --  18*  --  20*  --   GLUCOSE 187*  --  172*  --   --   --  149*  --  129*  --  122*  --   BUN 63*  --  75*  --   --   --  81*  --  85*  --  86*  --   CREATININE 1.50*  --  1.50*  --   --   --  1.58*  --  1.64*  --  1.50*  --   CALCIUM 7.2*  --  7.5*  --   --   --  7.4*  --  7.5*  --  7.4*  --    < > = values in this interval not displayed.   Estimated Creatinine Clearance: 31.3 mL/min (A) (by C-G formula based on SCr of 1.5 mg/dL (H)).   LIVER Recent Labs  Lab 02/25/20 0428 02/26/20 0440 02/27/20 0425 02/28/20 0344  AST 22 26 35 43*  ALT 40 32 31 26  ALKPHOS 138* 123 111 116  BILITOT 0.4 0.5 0.5 0.5  PROT 5.0* 4.9* 5.1* 4.8*  ALBUMIN 1.3* 1.3* 1.4* 1.3*     INFECTIOUS Recent Labs  Lab 02/25/20 1315 02/25/20 1644  LATICACIDVEN 1.6 1.8     ENDOCRINE CBG (last 3)  Recent Labs    03/02/20 0326 03/02/20 0740 03/02/20 1130  GLUCAP 110* 146* 201*   CRITICAL CARE The patient is critically ill with multiple organ systems failure and requires high complexity decision making for assessment and support, frequent evaluation and titration of therapies, application of advanced monitoring technologies and extensive interpretation of multiple databases.   Critical Care Time devoted to patient care services described in this note is 49 minutes. This time reflects time of care of  this [place] . This critical care time does not reflect separately billable procedures or procedure time, teaching time or supervisory time of PA/NP/Med student/Med Resident etc but could involve care discussion time.  Leone [facility] 03/02/2020 1:28 PM  Pager: [phone] After hours pager: [phone]

## 2020-03-02 NOTE — Progress Notes (Signed)
Patient ID: Tykel Badie, male   DOB: [birth year], 79 y.o.   MRN: [id number]  This NP visited patient at the bedside as a follow up for palliative medicine  needs and emotional support.  79 year old male, who speaks [place], with history of HTN, HFpEF, DMT2, glaucoma with recent COVID 01/16/2020 with subsequent subacute PE on Eliquis and home O2 2L [place] who presented 2/21 with exertional dyspnea, black tarry stools, and worsening vision loss in his right eye.  Patient lives at home with his daughter and pre-COVID was independent in his ADLs.   Patient with recent hospitalization at [facility] for [place] after initial positive 1/22 for failed outpatient treatment for COVID infection despite monoclonal antibodies and steroids.  While at [facility], treated with remdesivir and decadron and found to have a PE and was discharged home on 2/6 on 2L O2 and Eliquis.   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    On admit, 02-15-20 found to have worsening anemia, 8.8-> 6 with positive fecal occult blood testing and hypotension responsive to fluid.  Additionally found to have WBC 28k, CXR with worsening multifocal airspace consolidation bilaterally.  He was started empirically on zithromax and cefepime. PCT 0.5.  He was transfused with 2 units PRBC, started on protonix, and GI consulted.   However, given tenuous respiratory status and increased O2 needs, GI deferred EGD.  Hgb trend since stable and therefore placed on heparin gtt on 2/23.  He was diuresed on 2/23 given fluid overload post blood transfusion with good output then but has since required midodrine to help with soft blood pressures.  On 2/25, he had worsening hypotension despite midodrine, ongoing elevated O2 needs, and transaminitis,   Intubated on 02-22-20.  Remains intubated with worsening chest CT.  Family face treatment option decisions, advanced directive decisions and anticipatory care  needs.  Today I spoke to daughter Venus Ruhe # [phone] at bedside.  I discussed with her the importance of establishing clear contacts within the family for medical updates and the goal decisions. She was actually unsure of who the person that called me yesterday who told me that he was the patient's nephew and that he was Dr. Posey Pronto from [place] [phone])   She refers me to her son/the patient's grandson       Morad Tal (grandson) [phone]) as the main person to contact for communication within the family unit until her brother/patient's son Pierson Vantol # (country code [phone])  arrives from out of the country this Friday        I strongly recommended to the daughter that when her brother arrives here that a family meeting be set up for all relevant family members and treatment team to discuss patient's care medical situation, treatment options and goals of care.  Patient is high risk for decompensation with a likely long-term poor prognosis  Discussed with daughter the importance of continued conversation with the family and the medical providers regarding overall plan of care and treatment options,  ensuring decisions are within the context of the patients values and GOCs.  Questions and concerns addressed   Discussed with bedside RN    Discussed with Dr Shearon Stalls via secure chat  Total time spent on the unit was 35 minutes    PMT will follow-up and continue to support holistically.  Greater than 50% of the time was spent in counseling and coordination of care  Wadie Lessen NP  Palliative Medicine Team  Team Phone # [phone] Pager [phone]

## 2020-03-02 NOTE — Progress Notes (Signed)
[facility] for Infectious Disease    Date of Admission:  02/15/2020   Total days of antibiotics 7 vori/ampsub          ID: Bruce Webb is a 79 y.o. male with  Principal Problem:   Acute GI bleeding Active Problems:   Essential hypertension   Pulmonary embolism (HCC)   Pneumonia with cavity of lung   Hyponatremia   Hyperglycemia   Pressure injury of skin   Goals of care, counseling/discussion   Palliative care by specialist   Multifocal pneumonia   Cavitary pneumonia   Palliative care encounter   Ventilator dependence ([place])    Subjective: Afebrile, more alert, on 10 L; Wbc continues to trend down. Now at 33K. Had repeat CT which showed still same size cavitary lesions despite empiric treatment over the last 10+ days  Medications:  . brimonidine  1 drop Both Eyes BID  . chlorhexidine gluconate (MEDLINE KIT)  15 mL Mouth Rinse BID  . Chlorhexidine Gluconate Cloth  6 each Topical Daily  . dorzolamide  1 drop Both Eyes BID  . insulin aspart  0-15 Units Subcutaneous Q4H  . insulin aspart  6 Units Subcutaneous Q4H  . insulin glargine  20 Units Subcutaneous Daily  . latanoprost  1 drop Both Eyes QHS  . mouth rinse  15 mL Mouth Rinse 10 times per day  . midodrine  10 mg Per Tube BID WC  . multivitamin with minerals  1 tablet Per Tube Daily  . pantoprazole (PROTONIX) IV  40 mg Intravenous Q24H  . rocuronium  52 mg Intravenous Once  . sodium chloride flush  10-40 mL Intracatheter Q12H  . sodium chloride flush  3 mL Intravenous Q12H  . sodium chloride flush  3 mL Intravenous Q12H  . timolol  1 drop Both Eyes BID  . voriconazole  200 mg Per Tube Q12H  . white petrolatum   Topical BID    Objective: Vital signs in last 24 hours: Temp:  [96.7 F (35.9 C)-98.1 F (36.7 C)] 96.7 F (35.9 C) (03/09 1549) Pulse Rate:  [57-87] 60 (03/09 1508) Resp:  [20-38] 33 (03/09 1508) BP: (97-148)/(49-70) 130/62 (03/09 0800) SpO2:  [91 %-100 %] 99 % (03/09 1508) Arterial Line  BP: (113-171)/(41-71) 113/41 (03/09 1200) FiO2 (%):  [40 %] 40 % (03/09 1508) Weight:  [63.1 kg] 63.1 kg (03/09 0456)  Physical Exam  Constitutional: He is awake but does not follow commands. He appears chronically ill and mal-nourished. No distress.  HENT:  Mouth/Throat: OETT in place. No oropharyngeal exudate.  Cardiovascular: Normal rate, regular rhythm and normal heart sounds. Exam reveals no gallop and no friction rub.  No murmur heard.  Pulmonary/Chest: Effort normal and breath sounds normal. No respiratory distress. He has no wheezes.  Abdominal: Soft. Bowel sounds are normal. He exhibits no distension. There is no tenderness.  Skin: Skin is warm and dry. No rash noted. No erythema.    Lab Results Recent Labs    02/29/20 0526 02/29/20 0526 02/29/20 1140 03/01/20 0313 03/01/20 2044 03/02/20 0458  WBC 37.3*  --   --    < > 36.9* 33.9*  HGB 6.7*   < > 7.1*   < > 9.6* 9.1*  HCT 21.2*   < > 21.0*   < > 28.8* 28.0*  NA 137  --  136  --   --   --   K 5.0  --  4.9  --   --   --  CL 110  --   --   --   --   --   CO2 20*  --   --   --   --   --   BUN 86*  --   --   --   --   --   CREATININE 1.50*  --   --   --   --   --    < > = values in this interval not displayed.    Microbiology: reviewed Studies/Results: CT CHEST WO CONTRAST  Result Date: 03/01/2020 CLINICAL DATA:  re-evaluate cavitary lesion. post covid patient. respiratory failure., Pneumonia. EXAM: CT CHEST WITHOUT CONTRAST TECHNIQUE: Multidetector CT imaging of the chest was performed following the standard protocol without IV contrast. COMPARISON:  CT chest 02/20/2020 chest radiograph 02/28/2020 FINDINGS: Endotracheal tube in place with tip between the thoracic inlet and carina. There is a nasogastric tube in place which courses below the diaphragm with distal aspect out of field of view. There is a right upper extremity central venous catheter with tip terminating at the cavoatrial junction. Cardiovascular: Normal  heart size. Trace pericardial effusion. Normal caliber of the thoracic aorta and main pulmonary artery. Mediastinum/Nodes: No enlarged mediastinal or axillary lymph nodes. Thyroid gland, trachea, and esophagus demonstrate no significant findings. Lungs/Pleura: Central airways are patent. There are small bilateral pleural effusions, mildly increased from prior. There are again extensive bilateral heterogeneous and ground-glass airspace opacities which are increased in the right lung apices compared to prior. There is relative sparing of the left anterior upper lobe. There is again a dense area of consolidation in the anterior right upper lobe with a cavitary lesion measuring 3.2 x 2.5 cm (series 3, image 25) which is similar in appearance to prior. New small cystic space in the right lung apex measuring 1.5 cm (series 3, image 18) likely a pneumatocele. No pneumothorax. Emphysema. Upper Abdomen: No acute abnormality. Musculoskeletal: No chest wall mass or suspicious bone lesions identified. IMPRESSION: 1. Redemonstrated extensive bilateral airspace opacities, increased in the right lung apices compared to prior. Small bilateral effusions are mildly increased from prior. 2. Similar appearance of a cavitary lesion in the anterior right upper lobe. 3. New small cyst in the right lung apex measuring 1.5 cm likely a pneumatocele. 4. Trace pericardial effusion. Electronically Signed   By: Audie Pinto M.D.   On: 03/01/2020 13:24     Assessment/Plan: Cavitary pneumonia = not significantly changed since starting abtx > 10d.  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 Continue on amp/sub for efaecalis but also coverage for oral flora.  Leukocytosis = continues to trend down  Respiratory distress = slightly improved but easily hypoxic with movement. Defer to primary team for management.  [facility] for Infectious Diseases Cell: [phone] Pager:  [phone]  03/02/2020, 3:49 PM

## 2020-03-03 DIAGNOSIS — I2694 Multiple subsegmental pulmonary emboli without acute cor pulmonale: Secondary | ICD-10-CM

## 2020-03-03 DIAGNOSIS — E43 Unspecified severe protein-calorie malnutrition: Secondary | ICD-10-CM

## 2020-03-03 LAB — CBC
HCT: 27.6 % — ABNORMAL LOW (ref 39.0–52.0)
Hemoglobin: 8.5 g/dL — ABNORMAL LOW (ref 13.0–17.0)
MCH: 29.2 pg (ref 26.0–34.0)
MCHC: 30.8 g/dL (ref 30.0–36.0)
MCV: 94.8 fL (ref 80.0–100.0)
Platelets: 252 10*3/uL (ref 150–400)
RBC: 2.91 MIL/uL — ABNORMAL LOW (ref 4.22–5.81)
RDW: 22 % — ABNORMAL HIGH (ref 11.5–15.5)
WBC: 27.5 10*3/uL — ABNORMAL HIGH (ref 4.0–10.5)
nRBC: 1.1 % — ABNORMAL HIGH (ref 0.0–0.2)

## 2020-03-03 LAB — BASIC METABOLIC PANEL
Anion gap: 7 (ref 5–15)
BUN: 68 mg/dL — ABNORMAL HIGH (ref 8–23)
CO2: 22 mmol/L (ref 22–32)
Calcium: 7.3 mg/dL — ABNORMAL LOW (ref 8.9–10.3)
Chloride: 116 mmol/L — ABNORMAL HIGH (ref 98–111)
Creatinine, Ser: 1.11 mg/dL (ref 0.61–1.24)
GFR calc Af Amer: >60 mL/min (ref >60)
GFR calc non Af Amer: >60 mL/min (ref >60)
Glucose, Bld: 191 mg/dL — ABNORMAL HIGH (ref 70–99)
Potassium: 4 mmol/L (ref 3.5–5.1)
Sodium: 145 mmol/L (ref 135–145)

## 2020-03-03 LAB — GLUCOSE, CAPILLARY
Glucose-Capillary: 106 mg/dL — ABNORMAL HIGH (ref 70–99)
Glucose-Capillary: 118 mg/dL — ABNORMAL HIGH (ref 70–99)
Glucose-Capillary: 123 mg/dL — ABNORMAL HIGH (ref 70–99)
Glucose-Capillary: 154 mg/dL — ABNORMAL HIGH (ref 70–99)
Glucose-Capillary: 189 mg/dL — ABNORMAL HIGH (ref 70–99)
Glucose-Capillary: 189 mg/dL — ABNORMAL HIGH (ref 70–99)

## 2020-03-03 LAB — HEPARIN LEVEL (UNFRACTIONATED)
Heparin Unfractionated: 0.25 IU/mL — ABNORMAL LOW (ref 0.30–0.70)
Heparin Unfractionated: 0.43 IU/mL (ref 0.30–0.70)

## 2020-03-03 NOTE — Progress Notes (Signed)
ANTICOAGULATION CONSULT NOTE   Pharmacy Consult for Heparin Indication: Small subacute PE  No Known Allergies  Patient Measurements: Height: 5\' 2"  (157.5 cm) Weight: 139 lb 5.3 oz (63.2 kg) IBW/kg (Calculated) : 54.6 Heparin Dosing Weight: 52kg  Vital Signs: Temp: 97.8 F (36.6 C) (03/10 0402) Temp Source: Oral (03/10 0402) BP: 101/52 (03/10 0700) Pulse Rate: 64 (03/10 0700)  Labs: Recent Labs    03/01/20 0313 03/01/20 0313 03/01/20 2044 03/01/20 2044 03/02/20 0458 03/03/20 0447 03/03/20 0448  HGB 6.8*   < > 9.6*   < > 9.1*  --  8.5*  HCT 21.9*   < > 28.8*  --  28.0*  --  27.6*  PLT 282   < > 293  --  261  --  252  HEPARINUNFRC 0.32  --   --   --  0.28* 0.25*  --    < > = values in this interval not displayed.    Estimated Creatinine Clearance: 31.3 mL/min (A) (by C-G formula based on SCr of 1.5 mg/dL (H)).   Assessment: 79 y.o. male with 01/27/20 small subacute PE, PTA Eliquis on hold, last dose 2/21. Started on heparin with low goal for H/H drop requiring transfusion on admit, stable H/H and platelets x5 days. Switched back to Apixaban 2/26 >> 2/27, back to IV heparin on 2/28 due to intubation.   Heparin level is slightly sub-therapeutic today; no issue with heparin infusion per RN.    Goal of Therapy:  Hep level 0.3 - 0.5 units/mL Monitor platelets by anticoagulation protocol: Yes   Plan:  Increase heparin gtt to 700 units/hr 1600 HL Daily heparin level and CBC Monitor for bleeding  3/28, PharmD, BCPS, BCCCP Clinical Pharmacist [phone]  Please check AMION for all [facility] numbers  03/03/2020 7:44 AM

## 2020-03-03 NOTE — Progress Notes (Signed)
NAME:  Bruce Webb, MRN:  [id number], DOB:  1941/07/18, LOS: 75 ADMISSION DATE:  02/15/2020, CONSULTATION DATE:  02/19/2020 REFERRING MD:  Dr. Karleen Hampshire, CHIEF COMPLAINT:  Hypotension/ hypoxia    Brief History   79 year old male post COVID and PE in January on Eliquis admitted 2/21 to [facility] for extertional dyspnea found to be anemic with hemoccult positive stools.  GI consulted and transfused PRBC with since stabilized Hgb but has had persistent higher O2 requirements.  Was diuresed but today developed hypotension, with ongoing higher O2 requirements, PCCM was asked to consult.     History of present illness   HPI obtained via interpreter and from daughter.   79 year old male, who speaks [place], with history of HTN, HFpEF, DMT2, glaucoma with recent COVID 01/16/2020 with subsequent subacute PE on Eliquis and home O2 2L [place] who presented 2/21 with exertional dyspnea, black tarry stools, and worsening vision loss in his right eye.  Patient lives at home with his daughter and pre-COVID was independent in his ADLs.  He is a retired and prior worked in an office.  Denies any smoking or pulmonary history.  Reports prior to COVID, had a chronic non-productive cough for 10-15 years and his primary care MD treated it occasionally with cough medicine.   Patient with recent hospitalization at [facility] for [place] after initial positive 1/22 for failed outpatient treatment for COVID infection despite monoclonal antibodies and steroids.  While at [facility], treated with remdesivir and decadron and found to have a PE and was discharged home on 2/6 on 2L O2 and Eliquis.   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    On admit, found to have worsening anemia, 8.8-> 6 with positive fecal occult blood testing and hypotension responsive to fluid.  Additionally found to have WBC 28k, CXR with worsening multifocal airspace consolidation bilaterally.  He was started  empirically on zithromax and cefepime. PCT 0.5.  He was transfused with 2 units PRBC, started on protonix, and GI consulted. However, given tenuous respiratory status and increased O2 needs, GI deferred EGD.  Hgb trend since stable and therefore placed on heparin gtt on 2/23.  He was diuresed on 2/23 given fluid overload post blood transfusion with good output then but has since required midodrine to help with soft blood pressures.  On 2/25, he had worsening hypotension despite midodrine, ongoing elevated O2 needs, and transaminitis, PCCM asked to consult.     Past Medical History  HTN, HFpEF, DMT2, glaucoma with progressive vision loss (left, now rapid loss in right) - recent COVID 01/16/2020 with subsequent PE on Eliquis and O2 2L   Significant Hospital Events   2/21 admit. Stool ob =negatoive  2/23- echo ef 65%  2/25 PCCM consulted for hypotension and persistent hypoxemia  2/26 - Has severe desaturations with activity and eating. Required 10L while eating his meal today. No acute distress but continues to have shortness of breath. Denies chest pain.   2/27 -  -remains on 10 L.  He easily desaturates.  [name] at the bedside.  I spoke to her and subsequently also to their family friend who is in physician assistant in cardiology [place]).  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  According to the daughter he got worse with COVID-19 then got better went home but he declined again in terms of his hypoxemia.   Consults:  GI  PCCM  Procedures:   Significant Diagnostic Tests:  2/21 CTH >> 1. No acute intracranial abnormalities. 2. Mild cerebral atrophy with chronic microvascular ischemic changes in the cerebral white matter, as above.  CTA 01/27/20 - Diffuse subpleural/peripheral interstitial ground glass opacities with increased involvement in the the lower lobes bilaterally R>L. CXR 02/16/20 R>L multifocal pneumonia R>L, no edema or  effusion  2/23 TTE >> 1. Left ventricular ejection fraction, by estimation, is 60 to 65%. The left ventricle has normal function. The left ventricle has no regional wall motion abnormalities. Left ventricular diastolic parameters are consistent with Grade II diastolic dysfunction (pseudonormalization).  2. The history indicates the patient has a pulmonary embolus. The RV is  not dilated and there is normal RV function. . Right ventricular systolic function is low normal. The right ventricular size is normal.  3. The mitral valve is normal in structure and function. Mild mitral valve regurgitation. No evidence of mitral stenosis.  4. The aortic valve is normal in structure and function. Aortic valve regurgitation is not visualized. No aortic stenosis is present.   Micro Data:  2/21 BCx 2 >> ngtd x 4 days 2/25 - CCP 2/27 - quant gold -  2/27 RVP - negative 2/28  - urine strep - negative 228 - urine leg -  2/28 - aspergillus Ab -  xxxxxxxxxxxx 3/1 BAL Cx, fungal, acid fast >> negative 3/1 BAL PCP>> negative 3/ blood cultures - ngtd Antimicrobials:  2/21 azithro>>2/26 2/21 ceftriaxone  2/22 cefepime >> 2/28 Bactrim >3/4 3/1 Flagyl> 3/2 Voriconazole >> 3/4 Zyvox  Interim history/subjective:  On SBT this morning and tolerating well. Did 2 hours yesterday before desaturation.   Objective   Blood pressure 132/62, pulse 66, temperature (!) 96.6 F (35.9 C), temperature source Axillary, resp. rate 20, height _0  (1.575 m), weight 63.2 kg, SpO2 98 %.    Vent Mode: CPAP;PSV FiO2 (%):  [40 %] 40 % Set Rate:  [28 bmp] 28 bmp PEEP:  [5 cmH20] 5 cmH20 Pressure Support:  [5 cmH20-8 cmH20] 8 cmH20 Plateau Pressure:  [20 cmH20-40 cmH20] 20 cmH20   Intake/Output Summary (Last 24 hours) at 03/03/2020 0947 Last data filed at 03/03/2020 0600 Gross per 24 hour  Intake 1798.28 ml  Output 800 ml  Net 998.28 ml   Filed Weights   03/01/20 0312 03/02/20 0456 03/03/20 0325  Weight: 62.3 kg  63.1 kg 63.2 kg   Physical Exam: General: Chronically ill-appearing, no acute distress HENT: [place], AT, ETT in place Eyes: EOMI, no scleral icterus Respiratory: Clear to auscultation bilaterally.  No crackles, wheezing or rales Cardiovascular: RRR, -M/R/G, no JVD GI: BS+, soft, nontender Extremities:-Edema,-tenderness Neuro: Opens eyes to voice, grimaces to pain, moves extremities x 4. Follows commands GU: Foley in place  Resolved Hospital Problem list   Transaminitis -resolved.worsening likely in setting of shock. RUQ unrevealing  Assessment & Plan:  79 year old male never smoker with prior COVID-19 infection diagnosed in January and requiring hospitalization from 2/1-2/6, recently diagnosed pulmonary emboli on anticoagulation and end-stage glaucoma with right vision loss in addition to his known left vision loss who presents with worsening shortness of breath and admitted for acute on chronic respiratory failure secondary to multifocal pneumonia. Has had required high O2 requirements during the course of this hospitalization despite steroid therapy and transferred to the ICU on 2/28.  ARDS/Acute hypoxemic respiratory failure secondary to cavitary pneumonia and post COVID lung disease requiring mechanical ventilation. -Daily SBT and SAT.  He is passing today,  the second day in a row. Following commands as well.  - will discuss extubation with the family today. If he fails he will require tracheostomy.   Cavitary pneumonia of unclear etiology. Most likely bacterial - anaerobic from oral organisms. Relative immunosuppression from [place] may have led to aspergillus. TB negative. -Continue Vori for + aspergillus ag. Continue Amp-sulbactam and Flagyl.  Appreciate ID input -repeat chest demonstrates redemonstration of this cavitary lesion, as well as worsening airspace opacities bilateral upper lobes. -Given loss of vision in right eye, Fusobacterium as a cause of lung abscess per previous  discussion with ID.  Agitation requiring sedation -Full sedation vacation.  Avoid any sedation unless gas exchange impaired. -We will trial as needed and low-dose Precedex.  AKI with hyperkalemia --Gentle diuresis --Monitor UOP/Cr -Avoiding contrast unless absolutely necessary.  Small bilateral pulmonary emboli which may represent thrombosis in situ from pneumonia. --Continue heparin gtt --Trend CBC with hx of melena prior to admission  Acute blood loss anemia, melena.  S/p 2U PRBC. Hg stable with no further transfusions --GI consulted on admission but unable to perform EGD due to respiratory status --Trend CBC --PPI daily  Hyponatremia --Trend BMP daily  Hyperglycemia -SSI -Increase Lantus  Chronic diastolic heart failure -Gentle diuresis  Acute urinary retention 2/27 -Foley in place   Daily Goals Checklist  Pain/Anxiety/Delirium protocol (if indicated):  Low-dose Precedex, only if needed. VAP protocol (if indicated): bundle in place. Respiratory support goals: SBT. Potential extubation today. DVT prophylaxis: IV heparin Nutrition: tube feeds, at goal GI prophylaxis: pantoprazole. Urinary catheter: Condom. Central line: PICC Glucose control: SSI - euglycemic  Mobility: PT/OT consulted, advance as tolerated Code Status: full Family Communication: patient's family requested update to patient's nephew Daryn Hicks who is a PA at [employer]. Will update about possible extubation today Disposition: ICU.   LABS   Recent Labs  Lab 02/25/20 1546 02/26/20 0413 02/26/20 1225 02/27/20 0820 02/29/20 1140  PHART 7.382 7.372 7.401 7.315* 7.361  PCO2ART 32.1 32.6 29.6* 34.1 34.1  PO2ART 54.0* 61.0* 61.0* 57.0* 54.0*  HCO3 19.1* 19.1* 18.5* 17.5* 19.3*  TCO2 20* 20* 19* 19* 20*  O2SAT 88.0 91.0 92.0 88.0 87.0    CBC Recent Labs  Lab 03/01/20 2044 03/02/20 0458 03/03/20 0448  HGB 9.6* 9.1* 8.5*  HCT 28.8* 28.0* 27.6*  WBC 36.9* 33.9* 27.5*  PLT 293 261 252     COAGULATION No results for input(s): INR in the last 168 hours.  CARDIAC  No results for input(s): TROPONINI in the last 168 hours. No results for input(s): PROBNP in the last 168 hours.   CHEMISTRY Recent Labs  Lab 02/26/20 0440 02/26/20 1225 02/27/20 0425 02/27/20 0425 02/27/20 0820 02/27/20 0820 02/27/20 1705 02/27/20 1705 02/28/20 0344 02/28/20 0344 02/29/20 0526 02/29/20 1140  NA 129*   < > 131*   < > 130*  --  131*  --  135  --  137 136  K 5.1   < > 5.4*   < > 5.3*   < > 4.9   < > 5.3*   < > 5.0 4.9  CL 102  --  102  --   --   --  105  --  105  --  110  --   CO2 18*  --  19*  --   --   --  19*  --  18*  --  20*  --   GLUCOSE 187*  --  172*  --   --   --  149*  --  129*  --  122*  --   BUN 63*  --  75*  --   --   --  81*  --  85*  --  86*  --   CREATININE 1.50*  --  1.50*  --   --   --  1.58*  --  1.64*  --  1.50*  --   CALCIUM 7.2*  --  7.5*  --   --   --  7.4*  --  7.5*  --  7.4*  --    < > = values in this interval not displayed.   Estimated Creatinine Clearance: 31.3 mL/min (A) (by C-G formula based on SCr of 1.5 mg/dL (H)).   LIVER Recent Labs  Lab 02/26/20 0440 02/27/20 0425 02/28/20 0344  AST 26 35 43*  ALT 32 31 26  ALKPHOS 123 111 116  BILITOT 0.5 0.5 0.5  PROT 4.9* 5.1* 4.8*  ALBUMIN 1.3* 1.4* 1.3*     INFECTIOUS Recent Labs  Lab 02/25/20 1315 02/25/20 1644  LATICACIDVEN 1.6 1.8     ENDOCRINE CBG (last 3)  Recent Labs    03/02/20 2331 03/03/20 0335 03/03/20 0752  GLUCAP 125* 154* 189*   CRITICAL CARE The patient is critically ill with multiple organ systems failure and requires high complexity decision making for assessment and support, frequent evaluation and titration of therapies, application of advanced monitoring technologies and extensive interpretation of multiple databases.   Critical Care Time devoted to patient care services described in this note is 46 minutes. This time reflects time of care of this [place] . This critical care time does not reflect separately billable procedures or procedure time, teaching time or supervisory time of PA/NP/Med student/Med Resident etc but could involve care discussion time.  [facility] 03/03/2020 9:47 AM  Pager: [phone] After hours pager: [phone]

## 2020-03-03 NOTE — Progress Notes (Signed)
ANTICOAGULATION CONSULT NOTE   Pharmacy Consult for Heparin Indication: Small subacute PE  No Known Allergies  Patient Measurements: Height: 5\' 2"  (157.5 cm) Weight: 139 lb 5.3 oz (63.2 kg) IBW/kg (Calculated) : 54.6 Heparin Dosing Weight: 52kg  Vital Signs: Temp: 96.8 F (36 C) (03/10 1603) Temp Source: Axillary (03/10 1603) BP: 106/80 (03/10 1600) Pulse Rate: 85 (03/10 1600)  Labs: Recent Labs    03/01/20 0313 03/01/20 2044 03/02/20 0458 03/03/20 0447 03/03/20 0448 03/03/20 0939 03/03/20 1651  HGB   < > 9.6* 9.1*  --  8.5*  --   --   HCT  --  28.8* 28.0*  --  27.6*  --   --   PLT  --  293 261  --  252  --   --   HEPARINUNFRC   < >  --  0.28* 0.25*  --   --  0.43  CREATININE  --   --   --   --   --  1.11  --    < > = values in this interval not displayed.    Estimated Creatinine Clearance: 42.4 mL/min (by C-G formula based on SCr of 1.11 mg/dL).   Assessment: 79 y.o. male with 01/27/20 small subacute PE, PTA Eliquis on hold, last dose 2/21. Started on heparin with low goal for H/H drop requiring transfusion on admit, stable H/H and platelets x5 days. Switched back to Apixaban 2/26 >> 2/27, back to IV heparin on 2/28 due to intubation.   Heparin drip rate 700 uts/hr heparin level 0.43 at goal.  Last CBC low stable - watch  no issue with heparin infusion per RN.    Goal of Therapy:  Hep level 0.3 - 0.5 units/mL Monitor platelets by anticoagulation protocol: Yes   Plan:  Continue  heparin drip 700 units/hr Daily heparin level and CBC Monitor for bleeding  3/28 Pharm.D. CPP, BCPS Clinical Pharmacist [phone] 03/03/2020 6:09 PM    Please check AMION for all [facility] Pharmacy numbers  03/03/2020 6:08 PM

## 2020-03-04 DIAGNOSIS — N179 Acute kidney failure, unspecified: Secondary | ICD-10-CM

## 2020-03-04 DIAGNOSIS — Z794 Long term (current) use of insulin: Secondary | ICD-10-CM

## 2020-03-04 DIAGNOSIS — E1139 Type 2 diabetes mellitus with other diabetic ophthalmic complication: Secondary | ICD-10-CM

## 2020-03-04 LAB — GLUCOSE, CAPILLARY
Glucose-Capillary: 110 mg/dL — ABNORMAL HIGH (ref 70–99)
Glucose-Capillary: 114 mg/dL — ABNORMAL HIGH (ref 70–99)
Glucose-Capillary: 146 mg/dL — ABNORMAL HIGH (ref 70–99)
Glucose-Capillary: 174 mg/dL — ABNORMAL HIGH (ref 70–99)
Glucose-Capillary: 195 mg/dL — ABNORMAL HIGH (ref 70–99)
Glucose-Capillary: 96 mg/dL (ref 70–99)

## 2020-03-04 LAB — HEPARIN LEVEL (UNFRACTIONATED): Heparin Unfractionated: 0.3 IU/mL (ref 0.30–0.70)

## 2020-03-04 MED ORDER — VITAL AF 1.2 CAL PO LIQD
1000.0000 mL | ORAL | Status: DC
  Administered 2020-03-05 – 2020-03-08 (×5): 1000 mL
  Filled 2020-03-04 (×2): qty 1000

## 2020-03-04 MED ORDER — INSULIN GLARGINE 100 UNIT/ML ~~LOC~~ SOLN
10.0000 [IU] | Freq: Every day | SUBCUTANEOUS | Status: DC
  Administered 2020-03-05 – 2020-03-10 (×5): 10 [IU] via SUBCUTANEOUS
  Filled 2020-03-04 (×6): qty 0.1

## 2020-03-04 NOTE — Progress Notes (Signed)
PT Cancellation Note  Patient Details Name: Emmanuelle Coxe MRN: [id number] DOB: 06/01/1941   Cancelled Treatment:    Reason Eval/Treat Not Completed: Medical issues which prohibited therapy(Sedation increased today. Check back tomorrow. )   Berline Lopes 03/04/2020, 12:52 PM  Tilia Faso W,PT Acute Rehabilitation Services Pager:  [phone]  Office:  [phone]

## 2020-03-04 NOTE — Progress Notes (Signed)
Patient ID: Bruce Webb, male   DOB: March 17, 1941, 79 y.o.   MRN: [id number]  This NP discussed with Dr Celine Mans my various conversations with family.    She understands the situation and will continue discussion through the week-end with family.  She has established relationship. Plan is for tracheostomy on Saturday.  This nurse practitioner  will be out of the hospital until Monday morning. I will follow-up at that time.    Attending will call PMT with any questions or needs.  Call palliative medicine team phone # [phone] with questions or concerns.  No charge  Lorinda Creed NP  Palliative Medicine Team Team Phone # [phone] Pager [phone]

## 2020-03-04 NOTE — Progress Notes (Signed)
NAME:  Bruce Webb, MRN:  [id number], DOB:  August 18, 1941, LOS: 83 ADMISSION DATE:  02/15/2020, CONSULTATION DATE:  02/19/2020 REFERRING MD:  Dr. Karleen Hampshire, CHIEF COMPLAINT:  Hypotension/ hypoxia    Brief History   79 year old male post COVID and PE in January on Eliquis admitted 2/21 to [facility] for extertional dyspnea found to be anemic with hemoccult positive stools.  GI consulted and transfused PRBC with since stabilized Hgb but has had persistent higher O2 requirements.  Was diuresed but today developed hypotension, with ongoing higher O2 requirements, PCCM was asked to consult.     History of present illness   HPI obtained via interpreter and from daughter.   79 year old male, who speaks [place], with history of HTN, HFpEF, DMT2, glaucoma with recent COVID 01/16/2020 with subsequent subacute PE on Eliquis and home O2 2L [place] who presented 2/21 with exertional dyspnea, black tarry stools, and worsening vision loss in his right eye.  Patient lives at home with his daughter and pre-COVID was independent in his ADLs.  He is a retired and prior worked in an office.  Denies any smoking or pulmonary history.  Reports prior to COVID, had a chronic non-productive cough for 10-15 years and his primary care MD treated it occasionally with cough medicine.   Patient with recent hospitalization at [facility] for Moro after initial positive 1/22 for failed outpatient treatment for COVID infection despite monoclonal antibodies and steroids.  While at [facility], treated with remdesivir and decadron and found to have a PE and was discharged home on 2/6 on 2L O2 and Eliquis.   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    On admit, found to have worsening anemia, 8.8-> 6 with positive fecal occult blood testing and hypotension responsive to fluid.  Additionally found to have WBC 28k, CXR with worsening multifocal airspace consolidation bilaterally.  He was started  empirically on zithromax and cefepime. PCT 0.5.  He was transfused with 2 units PRBC, started on protonix, and GI consulted. However, given tenuous respiratory status and increased O2 needs, GI deferred EGD.  Hgb trend since stable and therefore placed on heparin gtt on 2/23.  He was diuresed on 2/23 given fluid overload post blood transfusion with good output then but has since required midodrine to help with soft blood pressures.  On 2/25, he had worsening hypotension despite midodrine, ongoing elevated O2 needs, and transaminitis, PCCM asked to consult.     Past Medical History  HTN, HFpEF, DMT2, glaucoma with progressive vision loss (left, now rapid loss in right) - recent COVID 01/16/2020 with subsequent PE on Eliquis and O2 2L   Significant Hospital Events   2/21 admit. Stool ob =negatoive  2/23- echo ef 65%  2/25 PCCM consulted for hypotension and persistent hypoxemia  2/26 - Has severe desaturations with activity and eating. Required 10L while eating his meal today. No acute distress but continues to have shortness of breath. Denies chest pain.   2/27 -  -remains on 10 L.  He easily desaturates.  [name] at the bedside.  I spoke to her and subsequently also to their family friend who is in physician assistant in cardiology [place]).  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  According to the daughter he got worse with COVID-19 then got better went home but he declined again in terms of his hypoxemia.   Consults:  GI  PCCM  Procedures:   Significant Diagnostic Tests:  2/21 CTH >> 1. No acute intracranial abnormalities. 2. Mild cerebral atrophy with chronic microvascular ischemic changes in the cerebral white matter, as above.  CTA 01/27/20 - Diffuse subpleural/peripheral interstitial ground glass opacities with increased involvement in the the lower lobes bilaterally R>L. CXR 02/16/20 R>L multifocal pneumonia R>L, no edema or  effusion  2/23 TTE >> 1. Left ventricular ejection fraction, by estimation, is 60 to 65%. The left ventricle has normal function. The left ventricle has no regional wall motion abnormalities. Left ventricular diastolic parameters are consistent with Grade II diastolic dysfunction (pseudonormalization).  2. The history indicates the patient has a pulmonary embolus. The RV is  not dilated and there is normal RV function. . Right ventricular systolic function is low normal. The right ventricular size is normal.  3. The mitral valve is normal in structure and function. Mild mitral valve regurgitation. No evidence of mitral stenosis.  4. The aortic valve is normal in structure and function. Aortic valve regurgitation is not visualized. No aortic stenosis is present.   Micro Data:  2/21 BCx 2 >> ngtd x 4 days 2/25 - CCP 2/27 - quant gold -  2/27 RVP - negative 2/28  - urine strep - negative 228 - urine leg -  2/28 - aspergillus Ab -  xxxxxxxxxxxx 3/1 BAL Cx, fungal, acid fast >> negative 3/1 BAL PCP>> negative 3/ blood cultures - ngtd Antimicrobials:  2/21 azithro>>2/26 2/21 ceftriaxone  2/22 cefepime >> 2/28 Bactrim >3/4 3/1 Flagyl> 3/2 Voriconazole >> 3/4 Zyvox  Interim history/subjective:  On SBT this morning and tolerating well. Did 2 hours yesterday before desaturation.   Objective   Blood pressure (!) 124/59, pulse 69, temperature 99.1 F (37.3 C), temperature source Axillary, resp. rate (!) 29, height _0  (1.575 m), weight 66.6 kg, SpO2 100 %.    Vent Mode: PCV FiO2 (%):  [40 %] 40 % Set Rate:  [28 bmp] 28 bmp PEEP:  [5 cmH20] 5 cmH20 Pressure Support:  [5 cmH20] 5 cmH20 Plateau Pressure:  [29 cmH20-39 cmH20] 33 cmH20   Intake/Output Summary (Last 24 hours) at 03/04/2020 1512 Last data filed at 03/04/2020 1100 Gross per 24 hour  Intake 1828.06 ml  Output 2350 ml  Net -521.94 ml   Filed Weights   03/02/20 0456 03/03/20 0325 03/04/20 0433  Weight: 63.1 kg 63.2  kg 66.6 kg   Physical Exam: General: Chronically ill-appearing, no acute distress, appears uncomfortable on ventilator HENT: [place], AT, ETT in place Eyes: EOMI, no scleral icterus Respiratory: Clear to auscultation bilaterally.  No crackles, wheezing or rales Cardiovascular: RRR, -M/R/G, no JVD GI: BS+, soft, nontender Extremities:-Edema,-tenderness Neuro: Opens eyes to voice, grimaces to pain, moves extremities x 4. Follows commands GU: Foley in place  Resolved Hospital Problem list   Transaminitis -resolved.worsening likely in setting of shock. RUQ unrevealing  Assessment & Plan:  79 year old male never smoker with prior COVID-19 infection diagnosed in January and requiring hospitalization from 2/1-2/6, recently diagnosed pulmonary emboli on anticoagulation and end-stage glaucoma with right vision loss in addition to his known left vision loss who presents with worsening shortness of breath and admitted for acute on chronic respiratory failure secondary to multifocal pneumonia. Has had required high O2 requirements during the course of this hospitalization despite steroid therapy and transferred to the ICU on 2/28.  ARDS/Acute hypoxemic respiratory failure secondary to cavitary pneumonia and post COVID lung disease requiring mechanical ventilation. -Daily SBT and SAT.  Failing SBT's, not following commands - Unable to be extubated.  He is a prolonged ventilator wean.  He will require tracheostomy. Currently planned for Saturday - lung protective ventilation as tolerated  Cavitary pneumonia of unclear etiology. Most likely bacterial - anaerobic from oral organisms. Relative immunosuppression from Rudy may have led to aspergillus. TB negative. -Continue Vori for + aspergillus ag. Continue Amp-sulbactam and Flagyl.  Appreciate ID input -repeat chest demonstrates redemonstration of this cavitary lesion, as well as worsening airspace opacities bilateral upper lobes.  -Given loss of vision in  right eye, Fusobacterium as a cause of lung abscess per previous discussion with ID.  Agitation and delirium -We will trial as needed and low-dose Precedex. -Exacerbated by loss of vision  AKI with hyperkalemia --Monitor UOP/Cr -Avoiding contrast unless absolutely necessary.  Small bilateral pulmonary emboli which may represent thrombosis in situ from pneumonia. --Continue heparin gtt --Trend CBC with hx of melena prior to admission  Acute blood loss anemia, melena.  S/p 2U PRBC. Hg stable with no further transfusions --Trend CBC --PPI daily  Hyponatremia --Trend BMP daily  Type 2 diabetes mellitus -SSI - We will decrease his Lantus as he has been having lower blood sugars overall and still tolerating tube feeds.  Chronic diastolic heart failure -Maintain euvolemia  Acute urinary retention 2/27 -Foley in place   Daily Goals Checklist  Pain/Anxiety/Delirium protocol (if indicated): Precedex VAP protocol (if indicated): bundle in place. Respiratory support goals: Daily SBT as tolerated.  Plan is for tracheostomy Saturday DVT prophylaxis: IV heparin Nutrition: tube feeds, at goal GI prophylaxis: pantoprazole. Urinary catheter: Condom. Central line: PICC Glucose control: SSI - euglycemic  Mobility: PT/OT consulted, advance as tolerated Code Status: full Family Communication: I discussed Mr. Harkleroad plan of care today with his cousin Dr. Edwyna Shell who is a physician in [place].  Explained that while the patient is not medically worsening, he is not making meaningful improvements in his chronic critical illness.  He will most certainly require tracheostomy to facilitate prolonged ventilator weaning.  However this does not reduce his risk for bacterial superinfection in the setting of Covid 19 pneumonia which he is already being treated for.  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  Current  plan is for tracheostomy on Saturday after the patient's son arrives from [place] tomorrow.  I encouraged Dr. Posey Pronto to discuss goals of care with the patient's family. Disposition: ICU.   LABS   Recent Labs  Lab 02/27/20 0820 02/29/20 1140  PHART 7.315* 7.361  PCO2ART 34.1 34.1  PO2ART 57.0* 54.0*  HCO3 17.5* 19.3*  TCO2 19* 20*  O2SAT 88.0 87.0    CBC Recent Labs  Lab 03/01/20 2044 03/02/20 0458 03/03/20 0448  HGB 9.6* 9.1* 8.5*  HCT 28.8* 28.0* 27.6*  WBC 36.9* 33.9* 27.5*  PLT 293 261 252    COAGULATION No results for input(s): INR in the last 168 hours.  CARDIAC  No results for input(s): TROPONINI in the last 168 hours. No results for input(s): PROBNP in the last 168 hours.   CHEMISTRY Recent Labs  Lab 02/27/20 0425 02/27/20 0820 02/27/20 1705 02/27/20 1705 02/28/20 0344 02/28/20 0344 02/29/20 0526 02/29/20 0526 02/29/20 1140 03/03/20 0939  NA 131*   < > 131*  --  135  --  137  --  136 145  K 5.4*   < > 4.9   < > 5.3*   < > 5.0   < >  4.9 4.0  CL 102  --  105  --  105  --  110  --   --  116*  CO2 19*  --  19*  --  18*  --  20*  --   --  22  GLUCOSE 172*  --  149*  --  129*  --  122*  --   --  191*  BUN 75*  --  81*  --  85*  --  86*  --   --  68*  CREATININE 1.50*  --  1.58*  --  1.64*  --  1.50*  --   --  1.11  CALCIUM 7.5*  --  7.4*  --  7.5*  --  7.4*  --   --  7.3*   < > = values in this interval not displayed.   Estimated Creatinine Clearance: 46.1 mL/min (by C-G formula based on SCr of 1.11 mg/dL).   LIVER Recent Labs  Lab 02/27/20 0425 02/28/20 0344  AST 35 43*  ALT 31 26  ALKPHOS 111 116  BILITOT 0.5 0.5  PROT 5.1* 4.8*  ALBUMIN 1.4* 1.3*     INFECTIOUS No results for input(s): LATICACIDVEN, PROCALCITON in the last 168 hours.   ENDOCRINE CBG (last 3)  Recent Labs    03/04/20 0338 03/04/20 0753 03/04/20 1135  GLUCAP 110* 114* 96   CRITICAL CARE The patient is critically ill with multiple organ systems failure and  requires high complexity decision making for assessment and support, frequent evaluation and titration of therapies, application of advanced monitoring technologies and extensive interpretation of multiple databases.   Critical Care Time devoted to patient care services described in this note is 56 minutes. This time reflects time of care of this [place] . This critical care time does not reflect separately billable procedures or procedure time, teaching time or supervisory time of PA/NP/Med student/Med Resident etc but could involve care discussion time.  [facility] 03/04/2020 3:12 PM  Pager: [phone] After hours pager: [phone]

## 2020-03-04 NOTE — Progress Notes (Signed)
ANTICOAGULATION CONSULT NOTE  Pharmacy Consult for Heparin Indication: Small subacute PE  No Known Allergies  Patient Measurements: Height: 5\' 2"  (157.5 cm) Weight: 146 lb 13.2 oz (66.6 kg) IBW/kg (Calculated) : 54.6 Heparin Dosing Weight: 52kg  Vital Signs: Temp: 97.7 F (36.5 C) (03/11 0358) Temp Source: Oral (03/11 0358) BP: 121/55 (03/11 0700) Pulse Rate: 67 (03/11 0700)  Labs: Recent Labs    03/01/20 2044 03/01/20 2044 03/02/20 0458 03/02/20 0458 03/03/20 0447 03/03/20 0448 03/03/20 0939 03/03/20 1651 03/04/20 0304  HGB 9.6*   < > 9.1*  --   --  8.5*  --   --   --   HCT 28.8*  --  28.0*  --   --  27.6*  --   --   --   PLT 293  --  261  --   --  252  --   --   --   HEPARINUNFRC  --   --  0.28*   < > 0.25*  --   --  0.43 0.30  CREATININE  --   --   --   --   --   --  1.11  --   --    < > = values in this interval not displayed.    Estimated Creatinine Clearance: 46.1 mL/min (by C-G formula based on SCr of 1.11 mg/dL).   Assessment: 79 y.o. male with 01/27/20 small subacute PE, PTA Eliquis on hold, last dose 2/21. Started on heparin with low goal for H/H drop requiring transfusion on admit, stable H/H and platelets x5 days. Switched back to Apixaban 2/26 >> 2/27, back to IV heparin on 2/28 due to intubation.   Heparin level remains therapeutic and trended down; no issue with heparin infusion nor bleeding per RN.  Goal of Therapy:  Hep level 0.3 - 0.5 units/mL Monitor platelets by anticoagulation protocol: Yes   Plan:  Increase heparin gtt to 750 units/hr Daily heparin level and CBC  Belen Zwahlen D. 3/28, PharmD, BCPS, BCCCP 03/04/2020, 12:26 PM

## 2020-03-04 NOTE — Progress Notes (Signed)
Nutrition Follow-up   DOCUMENTATION CODES:   Not applicable  INTERVENTION:   Continue TF via OGT:   Increase Vital AF 1.2 to 70 ml/h x 20 hours per day (1400 ml)   Provides 1680 kcal, 105 gm protein, 1135 ml free water daily  NUTRITION DIAGNOSIS:   Inadequate oral intake related to altered GI function as evidenced by NPO status.  Ongoing   GOAL:   Patient will meet greater than or equal to 90% of their needs  Met with TF.  MONITOR:   Vent status, TF tolerance, Labs  ASSESSMENT:   79 yo male admitted with exertional dyspnea, anemia, hemoccult positive stools. PMH includes COVID and PE in January 2021, HTN, HF, DM-2, glaucoma.  Currently receiving Vital AF 1.2 at 60 ml/h x 20 h per day to account for TF to be held for 1 hour before and 1 hour after administration of Voriconazole BID at 10 am and 10 pm.    Patient not weaning from vent; plans for trach this weekend.   Patient remains intubated on ventilator support. MV: 18 L/min Temp (24hrs), Avg:98.2 F (36.8 C), Min:96.8 F (36 C), Max:99.3 F (37.4 C)   Labs reviewed. CBG's: [phone]  Medications reviewed and include novolog, lantus, MVI, voriconazole.  I/O +17.3 L since admission   Diet Order:   Diet Order            Diet NPO time specified  Diet effective ____              EDUCATION NEEDS:   Not appropriate for education at this time  Skin: Reviewed RN Assessment  Last BM:  3/11 rectal tube  Height:   Ht Readings from Last 1 Encounters:  02/24/20 _0  (1.575 m)    Weight:   Wt Readings from Last 1 Encounters:  03/04/20 66.6 kg    Ideal Body Weight:  53.6 kg  BMI:  Body mass index is 26.85 kg/m.  Estimated Nutritional Needs:   Kcal:  1650  Protein:  80-95 grams  Fluid:  > 1.6 L    Molli Barrows, RD, LDN, CNSC Please refer to Amion for contact information.

## 2020-03-04 NOTE — TOC Initial Note (Signed)
Transition of Care [facility]) - Initial/Assessment Note    Patient Details  Name: Bruce Webb MRN: [id number] Date of Birth: 28-Oct-1941  Transition of [facility]) CM/SW Contact:    Lawerance Sabal, RN Phone Number: 03/04/2020, 12:20 PM  Clinical Narrative:           Patient from home w daughter. Multiple family members have been involved in decision making.  Patient speaks [place], with history of HTN, HFpEF, DMT2, glaucoma with recent COVID 01/16/2020 with subsequent subacute PE on Eliquis and home O2 2L [place] who presented 2/21 with exertional dyspnea, black tarry stools, and worsening vision loss in his right eye.   Intubated 2/28, currently in ICU. Remains Full Code, palliative involved.  Per palliative, pont of contact designated by patient's daughter is: Kvion Shapley (grandson) [phone]) as the main person to contact for communication within the family unit until her brother/patient's son Bedford Winsor # (country code [place]) [phone])  [place] from [place] country this Friday.  TOC will continue to follow.            Expected Discharge Plan: (TBD) Barriers to Discharge: Continued Medical Work up   Patient Goals and CMS Choice        Expected Discharge Plan and Services Expected Discharge Plan: (TBD)                                              Prior Living Arrangements/Services                       Activities of Daily Living Home Assistive Devices/Equipment: None ADL Screening (condition at time of admission) Patient's cognitive ability adequate to safely complete daily activities?: Yes Is the patient deaf or have difficulty hearing?: No Does the patient have difficulty seeing, even when wearing glasses/contacts?: No Does the patient have difficulty concentrating, remembering, or making decisions?: No Patient able to express need for assistance with ADLs?: Yes Does the patient have difficulty dressing or bathing?: No Independently performs  ADLs?: Yes (appropriate for developmental age) Does the patient have difficulty walking or climbing stairs?: No Weakness of Legs: None Weakness of Arms/Hands: None  Permission Sought/Granted                  Emotional Assessment              Admission diagnosis:  Acute GI bleeding [K92.2] Vision loss of right eye [H54.61] Symptomatic anemia [D64.9] Multifocal pneumonia [J18.9] Patient Active Problem List   Diagnosis Date Noted  . Ventilator dependence (HCC)   . Cavitary pneumonia   . Palliative care encounter   . Multifocal pneumonia   . Goals of care, counseling/discussion   . Palliative care by specialist   . Pressure injury of skin 02/19/2020  . Acute GI bleeding 02/15/2020  . Pneumonia with cavity of lung 02/15/2020  . Hyponatremia 02/15/2020  . Hyperglycemia 02/15/2020  . Symptomatic anemia   . Vision loss of right eye   . Pulmonary embolism (HCC) 01/28/2020  . Pneumonia due to COVID-19 virus 01/27/2020  . Acute respiratory failure with hypoxia (HCC) 01/27/2020  . Essential hypertension 01/27/2020  . DM (diabetes mellitus), type 2 (HCC) 01/27/2020   PCP:  Ignatius Specking, MD Pharmacy:   [facility] - [place], [place] - [address] [place]  [address] Phone: [phone] Fax: [phone]  [id number] - [place], [place] - [address] [place] [address] Phone: [phone] Fax: [phone]     Social Determinants of Health (SDOH) Interventions    Readmission Risk Interventions No flowsheet data found.

## 2020-03-05 DIAGNOSIS — I129 Hypertensive chronic kidney disease with stage 1 through stage 4 chronic kidney disease, or unspecified chronic kidney disease: Secondary | ICD-10-CM

## 2020-03-05 DIAGNOSIS — E1122 Type 2 diabetes mellitus with diabetic chronic kidney disease: Secondary | ICD-10-CM

## 2020-03-05 LAB — GLUCOSE, CAPILLARY
Glucose-Capillary: 132 mg/dL — ABNORMAL HIGH (ref 70–99)
Glucose-Capillary: 157 mg/dL — ABNORMAL HIGH (ref 70–99)
Glucose-Capillary: 180 mg/dL — ABNORMAL HIGH (ref 70–99)
Glucose-Capillary: 166 mg/dL — ABNORMAL HIGH (ref 70–99)
Glucose-Capillary: 181 mg/dL — ABNORMAL HIGH (ref 70–99)

## 2020-03-05 LAB — CBC
HCT: 25.8 % — ABNORMAL LOW (ref 39.0–52.0)
Hemoglobin: 7.6 g/dL — ABNORMAL LOW (ref 13.0–17.0)
MCH: 29.7 pg (ref 26.0–34.0)
MCHC: 29.5 g/dL — ABNORMAL LOW (ref 30.0–36.0)
MCV: 100.8 fL — ABNORMAL HIGH (ref 80.0–100.0)
Platelets: 193 10*3/uL (ref 150–400)
RBC: 2.56 MIL/uL — ABNORMAL LOW (ref 4.22–5.81)
RDW: 24.2 % — ABNORMAL HIGH (ref 11.5–15.5)
WBC: 19.7 10*3/uL — ABNORMAL HIGH (ref 4.0–10.5)
nRBC: 0.5 % — ABNORMAL HIGH (ref 0.0–0.2)

## 2020-03-05 LAB — BASIC METABOLIC PANEL
Anion gap: 7 (ref 5–15)
BUN: 51 mg/dL — ABNORMAL HIGH (ref 8–23)
CO2: 24 mmol/L (ref 22–32)
Calcium: 7.5 mg/dL — ABNORMAL LOW (ref 8.9–10.3)
Chloride: 122 mmol/L — ABNORMAL HIGH (ref 98–111)
Creatinine, Ser: 0.94 mg/dL (ref 0.61–1.24)
GFR calc Af Amer: >60 mL/min (ref >60)
GFR calc non Af Amer: >60 mL/min (ref >60)
Glucose, Bld: 203 mg/dL — ABNORMAL HIGH (ref 70–99)
Potassium: 3.8 mmol/L (ref 3.5–5.1)
Sodium: 153 mmol/L — ABNORMAL HIGH (ref 135–145)

## 2020-03-05 LAB — HEPARIN LEVEL (UNFRACTIONATED)
Heparin Unfractionated: 1.8 IU/mL — ABNORMAL HIGH (ref 0.30–0.70)
Heparin Unfractionated: 0.39 IU/mL (ref 0.30–0.70)

## 2020-03-05 MED ORDER — MIDAZOLAM HCL 2 MG/2ML IJ SOLN
5.0000 mg | Freq: Once | INTRAMUSCULAR | Status: AC
  Administered 2020-03-06: 2 mg via INTRAVENOUS
  Filled 2020-03-05: qty 6

## 2020-03-05 MED ORDER — FENTANYL CITRATE (PF) 100 MCG/2ML IJ SOLN
200.0000 ug | Freq: Once | INTRAMUSCULAR | Status: AC
  Administered 2020-03-06: 100 ug via INTRAVENOUS
  Filled 2020-03-05: qty 4

## 2020-03-05 MED ORDER — FUROSEMIDE 10 MG/ML IJ SOLN
40.0000 mg | Freq: Every day | INTRAMUSCULAR | Status: DC
  Administered 2020-03-05 – 2020-03-06 (×2): 40 mg via INTRAVENOUS
  Filled 2020-03-05 (×2): qty 4

## 2020-03-05 MED ORDER — ETOMIDATE 2 MG/ML IV SOLN
40.0000 mg | Freq: Once | INTRAVENOUS | Status: AC
  Administered 2020-03-06: 11:00:00 20 mg via INTRAVENOUS
  Filled 2020-03-05: qty 20

## 2020-03-05 MED ORDER — VECURONIUM BROMIDE 10 MG IV SOLR
10.0000 mg | Freq: Once | INTRAVENOUS | Status: AC
  Administered 2020-03-06: 11:00:00 10 mg via INTRAVENOUS
  Filled 2020-03-05 (×2): qty 10

## 2020-03-05 MED ORDER — DIAZEPAM 2 MG PO TABS: 5.0000 mg | ORAL_TABLET | Freq: Once | ORAL | Status: DC

## 2020-03-05 MED ORDER — PANTOPRAZOLE SODIUM 40 MG PO PACK
40.0000 mg | PACK | Freq: Every day | ORAL | Status: DC
  Administered 2020-03-05 – 2020-03-15 (×10): 40 mg
  Filled 2020-03-05 (×12): qty 20

## 2020-03-05 MED ORDER — PROPOFOL 10 MG/ML IV BOLUS
500.0000 mg | Freq: Once | INTRAVENOUS | Status: DC
  Filled 2020-03-05: qty 60

## 2020-03-05 NOTE — Progress Notes (Signed)
[place] for Infectious Disease    Date of Admission:  02/15/2020     ID: Bruce Webb is a 79 y.o. male with cavitary lung disease Principal Problem:   Acute GI bleeding Active Problems:   Essential hypertension   Pulmonary embolism (HCC)   Pneumonia with cavity of lung   Hyponatremia   Hyperglycemia   Pressure injury of skin   Goals of care, counseling/discussion   Palliative care by specialist   Multifocal pneumonia   Cavitary pneumonia   Palliative care encounter   Ventilator dependence ([place])    Subjective: Afebrile, more alert able to follow simple commands per dr Shearon Stalls.  Medications:  . brimonidine  1 drop Both Eyes BID  . chlorhexidine gluconate (MEDLINE KIT)  15 mL Mouth Rinse BID  . Chlorhexidine Gluconate Cloth  6 each Topical Daily  . dorzolamide  1 drop Both Eyes BID  . [START ON 03/06/2020] etomidate  40 mg Intravenous Once  . [START ON 03/06/2020] fentaNYL (SUBLIMAZE) injection  200 mcg Intravenous Once  . furosemide  40 mg Intravenous Daily  . insulin aspart  0-15 Units Subcutaneous Q4H  . insulin glargine  10 Units Subcutaneous Daily  . latanoprost  1 drop Both Eyes QHS  . mouth rinse  15 mL Mouth Rinse 10 times per day  . [START ON 03/06/2020] midazolam  5 mg Intravenous Once  . midodrine  10 mg Per Tube BID WC  . multivitamin with minerals  1 tablet Per Tube Daily  . pantoprazole sodium  40 mg Per Tube Daily  . [START ON 03/06/2020] propofol  500 mg Intravenous Once  . rocuronium  52 mg Intravenous Once  . sodium chloride flush  10-40 mL Intracatheter Q12H  . sodium chloride flush  3 mL Intravenous Q12H  . sodium chloride flush  3 mL Intravenous Q12H  . timolol  1 drop Both Eyes BID  . [START ON 03/06/2020] vecuronium  10 mg Intravenous Once  . white petrolatum   Topical BID    Objective: Vital signs in last 24 hours: Temp:  [97.6 F (36.4 C)-98.4 F (36.9 C)] 97.9 F (36.6 C) (03/12 1540) Pulse Rate:  [58-119] 73 (03/12 1600) Resp:   [18-34] 28 (03/12 1600) BP: (91-135)/(49-119) 112/57 (03/12 1600) SpO2:  [97 %-100 %] 100 % (03/12 1600) FiO2 (%):  [40 %] 40 % (03/12 1540) Weight:  [67.6 kg] 67.6 kg (03/12 0421) Physical Exam  Constitutional: He is oriented to person, place, and time. He appears chronically ill and under-nourished. No distress.  HENT:  Mouth/Throat: OETT in place. No oropharyngeal exudate.  Cardiovascular: Normal rate, regular rhythm and normal heart sounds. Exam reveals no gallop and no friction rub.  No murmur heard.  Pulmonary/Chest: Effort normal and breath sounds normal. No respiratory distress. He has no wheezes.  Abdominal: Soft. Bowel sounds are normal. He exhibits no distension. There is no tenderness.  Skin: Skin is warm and dry. No rash noted. No erythema. Anasarca+ Ext: right arm picc line is c/d/i  Lab Results Recent Labs    03/03/20 0448 03/03/20 0939 03/05/20 0928 03/05/20 1130  WBC 27.5*  --  19.7*  --   HGB 8.5*  --  7.6*  --   HCT 27.6*  --  25.8*  --   NA  --  145  --  153*  K  --  4.0  --  3.8  CL  --  116*  --  122*  CO2  --  22  --  24  BUN  --  68*  --  51*  CREATININE  --  1.11  --  0.94   Lab Results  Component Value Date   ESRSEDRATE 98 (H) 02/22/2020    Microbiology: Cx=e.faecalis Studies/Results: No results found. 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 Covering oral flora pathogen. Recommend 10 additional days of amp/sub, can convert to amox/clav for addn 10-14 days to finish out course. Recommend repeat chest CT in 2-3 wk to see progress improvement  Will discontinue voriconazole. Aspergillus not found on any culture. Only had +antibody.  At onset of illness also loss of eye sight, since he is more stable =  recommend getting mri of brain to see if any evidence of septic emboli.  Will sign off.  [facility] for Infectious Diseases Cell: [phone] Pager: [phone]  03/05/2020, 4:45 PM

## 2020-03-05 NOTE — Progress Notes (Signed)
ANTICOAGULATION CONSULT NOTE  Pharmacy Consult for Heparin Indication: Small subacute PE  No Known Allergies  Patient Measurements: Height: 5\' 2"  (157.5 cm) Weight: 149 lb 0.5 oz (67.6 kg) IBW/kg (Calculated) : 54.6 Heparin Dosing Weight: 52kg  Vital Signs: Temp: 98.4 F (36.9 C) (03/12 0700) Temp Source: Axillary (03/12 0700) BP: 109/62 (03/12 1000) Pulse Rate: 64 (03/12 1000)  Labs: Recent Labs    03/03/20 0448 03/03/20 0939 03/03/20 1651 03/04/20 0304 03/05/20 0420 03/05/20 0928  HGB 8.5*  --   --   --   --  7.6*  HCT 27.6*  --   --   --   --  25.8*  PLT 252  --   --   --   --  193  HEPARINUNFRC  --   --    < > 0.30 1.80* 0.39  CREATININE  --  1.11  --   --   --   --    < > = values in this interval not displayed.    Estimated Creatinine Clearance: 46.4 mL/min (by C-G formula based on SCr of 1.11 mg/dL).   Assessment: 79 y.o. male with 01/27/20 small subacute PE, PTA Eliquis on hold, last dose 2/21. Started on heparin with low goal for H/H drop requiring transfusion on admit, stable H/H and platelets x5 days. Switched back to Apixaban 2/26 >> 2/27, back to IV heparin on 2/28 due to intubation.   Heparin level remains therapeutic (level of 1.8 is inaccurate - contaminated sample); no issue with heparin infusion nor bleeding per RN.  Goal of Therapy:  Hep level 0.3 - 0.5 units/mL Monitor platelets by anticoagulation protocol: Yes   Plan:  Continue heparin gtt at 750 units/hr Daily heparin level and CBC  Tome Wilson D. 3/28, PharmD, BCPS, BCCCP 03/05/2020, 11:17 AM

## 2020-03-05 NOTE — Progress Notes (Signed)
NAME:  Bruce Webb, MRN:  [id number], DOB:  [birth year], LOS: 63 ADMISSION DATE:  02/15/2020, CONSULTATION DATE:  02/19/2020 REFERRING MD:  Dr. Karleen Hampshire, CHIEF COMPLAINT:  Hypotension/ hypoxia    Brief History   79 year old male post COVID and PE in January on Eliquis admitted 2/21 to [facility] for extertional dyspnea found to be anemic with hemoccult positive stools.  GI consulted and transfused PRBC with since stabilized Hgb but has had persistent higher O2 requirements.  Was diuresed but today developed hypotension, with ongoing higher O2 requirements, PCCM was asked to consult.     History of present illness   HPI obtained via interpreter and from daughter.   79 year old male, who speaks [place], with history of HTN, HFpEF, DMT2, glaucoma with recent COVID 01/16/2020 with subsequent subacute PE on Eliquis and home O2 2L Bruce Webb who presented 2/21 with exertional dyspnea, black tarry stools, and worsening vision loss in his right eye.  Patient lives at home with his daughter and pre-COVID was independent in his ADLs.  He is a retired and prior worked in an office.  Denies any smoking or pulmonary history.  Reports prior to COVID, had a chronic non-productive cough for 10-15 years and his primary care MD treated it occasionally with cough medicine.   Patient with recent hospitalization at [facility] for [place] after initial positive 1/22 for failed outpatient treatment for COVID infection despite monoclonal antibodies and steroids.  While at [facility], treated with remdesivir and decadron and found to have a PE and was discharged home on 2/6 on 2L O2 and Eliquis.   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    On admit, found to have worsening anemia, 8.8-> 6 with positive fecal occult blood testing and hypotension responsive to fluid.  Additionally found to have WBC 28k, CXR with worsening multifocal airspace consolidation bilaterally.  He was started  empirically on zithromax and cefepime. PCT 0.5.  He was transfused with 2 units PRBC, started on protonix, and GI consulted. However, given tenuous respiratory status and increased O2 needs, GI deferred EGD.  Hgb trend since stable and therefore placed on heparin gtt on 2/23.  He was diuresed on 2/23 given fluid overload post blood transfusion with good output then but has since required midodrine to help with soft blood pressures.  On 2/25, he had worsening hypotension despite midodrine, ongoing elevated O2 needs, and transaminitis, PCCM asked to consult.     Past Medical History  HTN, HFpEF, DMT2, glaucoma with progressive vision loss (left, now rapid loss in right) - recent COVID 01/16/2020 with subsequent PE on Eliquis and O2 2L [place]  Significant Hospital Events   2/21 admit. Stool ob =negatoive  2/23- echo ef 65%  2/25 PCCM consulted for hypotension and persistent hypoxemia  2/26 - Has severe desaturations with activity and eating. Required 10L while eating his meal today. No acute distress but continues to have shortness of breath. Denies chest pain.   2/27 -  -remains on 10 L.  He easily desaturates.  Daughter Bruce Webb at the bedside.  I spoke to her and subsequently also to their family friend who is in physician assistant in cardiology [place]).  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  According to the daughter he got worse with COVID-19 then got better went home but he declined again in terms of his hypoxemia.   Consults:  GI  PCCM  Procedures:   Significant Diagnostic Tests:  2/21 CTH >> 1. No acute intracranial abnormalities. 2. Mild cerebral atrophy with chronic microvascular ischemic changes in the cerebral white matter, as above.  CTA 01/27/20 - Diffuse subpleural/peripheral interstitial ground glass opacities with increased involvement in the the lower lobes bilaterally R>L. CXR 02/16/20 R>L multifocal pneumonia R>L, no edema or  effusion  2/23 TTE >> 1. Left ventricular ejection fraction, by estimation, is 60 to 65%. The left ventricle has normal function. The left ventricle has no regional wall motion abnormalities. Left ventricular diastolic parameters are consistent with Grade II diastolic dysfunction (pseudonormalization).  2. The history indicates the patient has a pulmonary embolus. The RV is  not dilated and there is normal RV function. . Right ventricular systolic function is low normal. The right ventricular size is normal.  3. The mitral valve is normal in structure and function. Mild mitral valve regurgitation. No evidence of mitral stenosis.  4. The aortic valve is normal in structure and function. Aortic valve regurgitation is not visualized. No aortic stenosis is present.   Micro Data:  2/21 BCx 2 >> ngtd x 4 days 2/25 - CCP 2/27 - quant gold -  2/27 RVP - negative 2/28  - urine strep - negative 228 - urine leg -  2/28 - aspergillus Ab -  xxxxxxxxxxxx 3/1 BAL Cx, fungal, acid fast >> negative 3/1 BAL PCP>> negative 3/ blood cultures - ngtd Antimicrobials:  2/21 azithro>>2/26 2/21 ceftriaxone  2/22 cefepime >> 2/28 Bactrim >3/4 3/1 Flagyl> 3/2 Voriconazole >> 3/4 Zyvox  Interim history/subjective:  Did not pass SBT yesterday. No overnight issues.  Objective   Blood pressure (!) 113/56, pulse (!) 119, temperature 98.4 F (36.9 C), temperature source Axillary, resp. rate (!) 29, height _0  (1.575 m), weight 67.6 kg, SpO2 100 %.    Vent Mode: PCV FiO2 (%):  [40 %] 40 % Set Rate:  [22 bmp-28 bmp] 22 bmp PEEP:  [5 cmH20] 5 cmH20 Plateau Pressure:  [28 cmH20-38 cmH20] 37 cmH20   Intake/Output Summary (Last 24 hours) at 03/05/2020 0937 Last data filed at 03/05/2020 0700 Gross per 24 hour  Intake 1613.02 ml  Output 1200 ml  Net 413.02 ml   Filed Weights   03/03/20 0325 03/04/20 0433 03/05/20 0421  Weight: 63.2 kg 66.6 kg 67.6 kg   Physical Exam: General: Chronically  ill-appearing, no acute distress, appears uncomfortable on ventilator HENT: [place], AT, ETT in place Eyes: EOMI, no scleral icterus Respiratory: Clear to auscultation bilaterally.  No crackles, wheezing or rales Cardiovascular: RRR, -M/R/G, no JVD GI: BS+, soft, nontender Extremities:-Edema,-tenderness Neuro: Opens eyes to voice, grimaces to pain, moves extremities x 4. Follows commands GU: Foley in place  Resolved Hospital Problem list   Transaminitis -resolved.worsening likely in setting of shock. RUQ unrevealing  Assessment & Plan:  79 year old male never smoker with prior COVID-19 infection diagnosed in January and requiring hospitalization from 2/1-2/6, recently diagnosed pulmonary emboli on anticoagulation and end-stage glaucoma with right vision loss in addition to his known left vision loss who presents with worsening shortness of breath and admitted for acute on chronic respiratory failure secondary to multifocal pneumonia. Has had required high O2 requirements during the course of this hospitalization despite steroid therapy and transferred to the ICU on 2/28.  ARDS/Acute hypoxemic respiratory failure secondary to cavitary pneumonia and post COVID lung disease requiring mechanical ventilation. -Daily SBT and SAT.  Failing SBT's, not following commands - Unable to be extubated.  He is a prolonged ventilator wean.  He will require tracheostomy. Currently planned for Saturday - lung protective ventilation as tolerated - he is significantly positive fluid balance, now that Blood pressure is better will start with gentle diuresis  Cavitary pneumonia of unclear etiology. Most likely bacterial - anaerobic from oral organisms. Relative immunosuppression from [place] may have led to aspergillus. TB negative. Continue Amp-sulbactam.  Appreciate ID input -repeat chest demonstrates redemonstration of this cavitary lesion, as well as worsening airspace opacities bilateral upper lobes.  - discussed  with ID - onVori for + aspergillus Ag negative, Ab positive. Consider discontinuation of voriconazole.  Agitation and delirium - on low dose precedex -Exacerbated by loss of vision - intermittently follows commands. Suspect delirium. Can consider MRI if not improving.   AKI with hyperkalemia --Monitor UOP/Cr -Avoiding contrast unless absolutely necessary.  Small bilateral pulmonary emboli which may represent thrombosis in situ from pneumonia. --Continue heparin gtt --Trend CBC with hx of melena prior to admission  Acute blood loss anemia, melena.  S/p 2U PRBC. Hg stable with no further transfusions --Trend CBC --PPI daily  Hyponatremia - improved --Trend BMP daily  Type 2 diabetes mellitus -SSI - blood sugars controlled Chronic diastolic heart failure - diuresis as above  Acute urinary retention 2/27 -Foley in place   Daily Goals Checklist  Pain/Anxiety/Delirium protocol (if indicated): Precedex VAP protocol (if indicated): bundle in place. Respiratory support goals: Daily SBT as tolerated.  Plan is for tracheostomy Saturday DVT prophylaxis: IV heparin Nutrition: tube feeds, at goal GI prophylaxis: pantoprazole. Urinary catheter: Condom. Central line: PICC Glucose control: SSI - euglycemic  Mobility: PT/OT consulted, advance as tolerated Code Status: full Family Communication: I discussed Mr. Royce plan of care 3/11 with his cousin Dr. Edwyna Shell who is a physician in [place].  Explained that while the patient is not medically worsening, he is not making meaningful improvements in his chronic critical illness.  He will most certainly require tracheostomy to facilitate prolonged ventilator weaning.  However this does not reduce his risk for bacterial superinfection in the setting of Covid 19 pneumonia which he is already being treated for.  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   Current plan is for tracheostomy on Saturday after the patient's son arrives from [place] tomorrow.  I encouraged Dr. Posey Pronto to discuss goals of care with the patient's family. Disposition: ICU.   LABS   Recent Labs  Lab 02/29/20 1140  PHART 7.361  PCO2ART 34.1  PO2ART 54.0*  HCO3 19.3*  TCO2 20*  O2SAT 87.0    CBC Recent Labs  Lab 03/01/20 2044 03/02/20 0458 03/03/20 0448  HGB 9.6* 9.1* 8.5*  HCT 28.8* 28.0* 27.6*  WBC 36.9* 33.9* 27.5*  PLT 293 261 252    COAGULATION No results for input(s): INR in the last 168 hours.  CARDIAC  No results for input(s): TROPONINI in the last 168 hours. No results for input(s): PROBNP in the last 168 hours.   CHEMISTRY Recent Labs  Lab 02/27/20 1705 02/27/20 1705 02/28/20 0344 02/28/20 0344 02/29/20 0526 02/29/20 0526 02/29/20 1140 03/03/20 0939  NA 131*  --  135  --  137  --  136 145  K 4.9   < > 5.3*   < > 5.0   < > 4.9 4.0  CL 105  --  105  --  110  --   --  116*  CO2 19*  --  18*  --  20*  --   --  22  GLUCOSE 149*  --  129*  --  122*  --   --  191*  BUN 81*  --  85*  --  86*  --   --  68*  CREATININE 1.58*  --  1.64*  --  1.50*  --   --  1.11  CALCIUM 7.4*  --  7.5*  --  7.4*  --   --  7.3*   < > = values in this interval not displayed.   Estimated Creatinine Clearance: 46.4 mL/min (by C-G formula based on SCr of 1.11 mg/dL).   LIVER Recent Labs  Lab 02/28/20 0344  AST 43*  ALT 26  ALKPHOS 116  BILITOT 0.5  PROT 4.8*  ALBUMIN 1.3*     INFECTIOUS No results for input(s): LATICACIDVEN, PROCALCITON in the last 168 hours.   ENDOCRINE CBG (last 3)  Recent Labs    03/04/20 2342 03/05/20 0329 03/05/20 0751  GLUCAP 195* 132* 157*   CRITICAL CARE The patient is critically ill with multiple organ systems failure and requires high complexity decision making for assessment and support, frequent evaluation and titration of therapies, application of advanced monitoring technologies and extensive  interpretation of multiple databases.   Critical Care Time devoted to patient care services described in this note is 47 minutes. This time reflects time of care of this [place] . This critical care time does not reflect separately billable procedures or procedure time, teaching time or supervisory time of PA/NP/Med student/Med Resident etc but could involve care discussion time.  [facility] 03/05/2020 9:37 AM  Pager: [phone] After hours pager: [phone]

## 2020-03-05 NOTE — Progress Notes (Signed)
Pt had a significant vasovagal response to ETT suctioning with 8 second cardiac pause, current HR low 60's.  E-link aware providing video surveillance.

## 2020-03-05 NOTE — Progress Notes (Signed)
Physical Therapy Treatment Patient Details Name: Bruce Webb MRN: [id number] DOB: 30-Jul-1941 Today's Date: 03/05/2020    History of Present Illness 79 y.o. gentleman prior history of essential hypertension, type 2 diabetes mellitus, recent COVID-19 illness initially diagnosed on 01/16/2020. Patient was discharged from hospital on recent discharge from the hospital on 01/31/2020 on 2L of nasal cannula oxygen and had been found to have pulmonary embolism. 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 He presented to the emergency department with exertional dyspnea, melena, and progressive vision loss involving the right eye. Chest x-ray of 2/22: asymmetric airspace disease, right greater than left. WBC increased on 2/24; afebrile.  Intubated 2/28 due to decline in medical status. TB has been ruled out.    PT Comments    Pt admitted with above diagnosis. Pt was able to sit EOB 10 minutes with max assist initially progressing to min assist.  Pt varies as he fatigued. Pt kept wanting to lie down and appeared more fatigued today.  Pt currently with functional limitations due to balance and endurance deficits. Pt will benefit from skilled PT to increase their independence and safety with mobility to allow discharge to the venue listed below.     Follow Up Recommendations  LTACH;Supervision/Assistance - 24 hour     Equipment Recommendations  Other (comment)(TBD)    Recommendations for Other Services       Precautions / Restrictions Precautions Precautions: Fall Precaution Comments: watch O2 Restrictions Weight Bearing Restrictions: No    Mobility  Bed Mobility Overal bed mobility: Needs Assistance Bed Mobility: Supine to Sit;Sit to Supine     Supine to sit: Max assist;+2 for physical assistance;HOB elevated Sit to supine: Max assist;+2 for physical assistance   General bed mobility comments: Pt needed max assist to come to EOB with use of pad and incr assist for  elevation of trunk. Also needing max assist to lie down.  Needed help with trunk and LEs back to bed.   Transfers                 General transfer comment: unable at present  Ambulation/Gait                 Stairs             Wheelchair Mobility    Modified Rankin (Stroke Patients Only)       Balance Overall balance assessment: Needs assistance Sitting-balance support: No upper extremity supported;Feet supported Sitting balance-Leahy Scale: Zero Sitting balance - Comments: Pt needed max assist initially progressing to min assist to sit EOB with pt leaning posteriorly and not using his hands to hold himself up. Sat EOB for up to 10 minutes.                                     Cognition Arousal/Alertness: Awake/alert Behavior During Therapy: WFL for tasks assessed/performed Overall Cognitive Status: Difficult to assess                                 General Comments: Pt was able to follow ~50% multimodal simple commands for ROM (did not use interpreter due to Ipad could not find an interpreter that spoke pts language, no family present      Exercises General Exercises - Lower Extremity Long Arc Quad: AAROM;AROM;Both;5 reps;Seated Other Exercises Other Exercises:  heel cord stretch bil 30 sec    General Comments General comments (skin integrity, edema, etc.): VSS with vent 40% FiO2, PEEP 5, RR 30-42, 114/67final BP.  HR 76-96 bpm; had to hit the O2 breaths button at least 4 x during treatment giving pt 100% O2 due to coughing and incr respiratory effort and this helped pt relax.       Pertinent Vitals/Pain Pain Assessment: No/denies pain    Home Living                      Prior Function            PT Goals (current goals can now be found in the care plan section) Acute Rehab PT Goals Patient Stated Goal: Go home to family Progress towards PT goals: Progressing toward goals    Frequency    Min  2X/week      PT Plan Current plan remains appropriate    Co-evaluation              AM-PAC PT "6 Clicks" Mobility   Outcome Measure  Help needed turning from your back to your side while in a flat bed without using bedrails?: Total Help needed moving from lying on your back to sitting on the side of a flat bed without using bedrails?: Total Help needed moving to and from a bed to a chair (including a wheelchair)?: Total Help needed standing up from a chair using your arms (e.g., wheelchair or bedside chair)?: Total Help needed to walk in hospital room?: Total Help needed climbing 3-5 steps with a railing? : Total 6 Click Score: 6    End of Session Equipment Utilized During Treatment: Other (comment)(VENT) Activity Tolerance: Patient limited by fatigue Patient left: in bed;with call bell/phone within reach;with bed alarm set Nurse Communication: Mobility status;Need for lift equipment PT Visit Diagnosis: Unsteadiness on feet (R26.81);Other abnormalities of gait and mobility (R26.89);Muscle weakness (generalized) (M62.81)     Time: 2409-7353 PT Time Calculation (min) (ACUTE ONLY): 32 min  Charges:  $Therapeutic Exercise: 8-22 mins $Therapeutic Activity: 8-22 mins                     Tavarious Freel W,PT Acute Rehabilitation Services Pager:  [phone]  Office:  [place] 03/05/2020, 1:42 PM

## 2020-03-06 ENCOUNTER — Inpatient Hospital Stay (HOSPITAL_COMMUNITY): Payer: Medicare Other

## 2020-03-06 DIAGNOSIS — E870 Hyperosmolality and hypernatremia: Secondary | ICD-10-CM

## 2020-03-06 DIAGNOSIS — J690 Pneumonitis due to inhalation of food and vomit: Secondary | ICD-10-CM

## 2020-03-06 LAB — GLUCOSE, CAPILLARY
Glucose-Capillary: 105 mg/dL — ABNORMAL HIGH (ref 70–99)
Glucose-Capillary: 112 mg/dL — ABNORMAL HIGH (ref 70–99)
Glucose-Capillary: 112 mg/dL — ABNORMAL HIGH (ref 70–99)
Glucose-Capillary: 149 mg/dL — ABNORMAL HIGH (ref 70–99)
Glucose-Capillary: 179 mg/dL — ABNORMAL HIGH (ref 70–99)
Glucose-Capillary: 208 mg/dL — ABNORMAL HIGH (ref 70–99)
Glucose-Capillary: 97 mg/dL (ref 70–99)

## 2020-03-06 LAB — CBC
HCT: 26.2 % — ABNORMAL LOW (ref 39.0–52.0)
Hemoglobin: 7.8 g/dL — ABNORMAL LOW (ref 13.0–17.0)
MCH: 29.7 pg (ref 26.0–34.0)
MCHC: 29.8 g/dL — ABNORMAL LOW (ref 30.0–36.0)
MCV: 99.6 fL (ref 80.0–100.0)
Platelets: 190 10*3/uL (ref 150–400)
RBC: 2.63 MIL/uL — ABNORMAL LOW (ref 4.22–5.81)
RDW: 24.7 % — ABNORMAL HIGH (ref 11.5–15.5)
WBC: 17.3 10*3/uL — ABNORMAL HIGH (ref 4.0–10.5)
nRBC: 0.2 % (ref 0.0–0.2)

## 2020-03-06 LAB — BASIC METABOLIC PANEL
Anion gap: 7 (ref 5–15)
BUN: 53 mg/dL — ABNORMAL HIGH (ref 8–23)
CO2: 27 mmol/L (ref 22–32)
Calcium: 7.6 mg/dL — ABNORMAL LOW (ref 8.9–10.3)
Chloride: 121 mmol/L — ABNORMAL HIGH (ref 98–111)
Creatinine, Ser: 0.92 mg/dL (ref 0.61–1.24)
GFR calc Af Amer: >60 mL/min (ref >60)
GFR calc non Af Amer: >60 mL/min (ref >60)
Glucose, Bld: 120 mg/dL — ABNORMAL HIGH (ref 70–99)
Potassium: 3.7 mmol/L (ref 3.5–5.1)
Sodium: 155 mmol/L — ABNORMAL HIGH (ref 135–145)

## 2020-03-06 LAB — PROTIME-INR
INR: 1.1 (ref 0.8–1.2)
Prothrombin Time: 14.1 seconds (ref 11.4–15.2)

## 2020-03-06 LAB — APTT: aPTT: 28 seconds (ref 24–36)

## 2020-03-06 LAB — HEPARIN LEVEL (UNFRACTIONATED): Heparin Unfractionated: <0.1 IU/mL — ABNORMAL LOW (ref 0.30–0.70)

## 2020-03-06 MED ORDER — FENTANYL CITRATE (PF) 100 MCG/2ML IJ SOLN
INTRAMUSCULAR | Status: AC
  Filled 2020-03-06: qty 2

## 2020-03-06 MED ORDER — AMOXICILLIN-POT CLAVULANATE 875-125 MG PO TABS
1.0000 | ORAL_TABLET | Freq: Two times a day (BID) | ORAL | Status: DC
  Filled 2020-03-06: qty 1

## 2020-03-06 MED ORDER — NOREPINEPHRINE 4 MG/250ML-% IV SOLN
0.0000 ug/min | INTRAVENOUS | Status: DC
  Administered 2020-03-06: 4 ug/min via INTRAVENOUS

## 2020-03-06 MED ORDER — APIXABAN 5 MG PO TABS: 5.0000 mg | ORAL_TABLET | Freq: Two times a day (BID) | ORAL | Status: DC

## 2020-03-06 MED ORDER — APIXABAN 5 MG PO TABS
5.0000 mg | ORAL_TABLET | Freq: Two times a day (BID) | ORAL | Status: DC
  Administered 2020-03-06 – 2020-03-08 (×4): 5 mg
  Filled 2020-03-06 (×4): qty 1

## 2020-03-06 MED ORDER — AMOXICILLIN-POT CLAVULANATE 875-125 MG PO TABS
1.0000 | ORAL_TABLET | Freq: Two times a day (BID) | ORAL | Status: DC
  Administered 2020-03-06 – 2020-03-15 (×18): 1
  Filled 2020-03-06 (×20): qty 1

## 2020-03-06 MED ORDER — FENTANYL CITRATE (PF) 100 MCG/2ML IJ SOLN
25.0000 ug | INTRAMUSCULAR | Status: DC | PRN
  Administered 2020-03-06: 25 ug via INTRAVENOUS
  Administered 2020-03-06: 50 ug via INTRAVENOUS
  Filled 2020-03-06 (×2): qty 2

## 2020-03-06 MED ORDER — NOREPINEPHRINE 4 MG/250ML-% IV SOLN
INTRAVENOUS | Status: AC
  Filled 2020-03-06: qty 250

## 2020-03-06 NOTE — Procedures (Signed)
Bronchoscopy Procedure Note Bruce Webb [id number] November 05, 1941  Procedure: Bronchoscopy Indications: Diagnostic evaluation of the airways and tracheostomy placement  Procedure Details Consent: Risks of procedure as well as the alternatives and risks of each were explained to the (patient/caregiver).  Consent for procedure obtained. Time Out: Verified patient identification, verified procedure, site/side was marked, verified correct patient position, special equipment/implants available, medications/allergies/relevent history reviewed, required imaging and test results available.  Performed  In preparation for procedure, patient was given 100% FiO2 and bronchoscope lubricated. Sedation: Benzodiazepines and Etomidate  Airway entered and the following bronchi were examined: trachea. There was dessiccated mucus at the end of the ETT and in the trachea. Carina was sharp Procedures performed: tracheostomy Bronchoscope removed. Patient placed back on 100% FiO2 at conclusion of procedure via tracheostomy.   Evaluation Hemodynamic Status: BP stable throughout; O2 sats: stable throughout Patient's Current Condition: stable Specimens:  None Complications: No apparent complications Patient did tolerate procedure well.   Charlott Holler 03/06/2020

## 2020-03-06 NOTE — Procedures (Signed)
Procedure: Percutaneous Tracheostomy CPT 31600 Performed by: Dr. Myrla Halsted Bronchoscopy Assistant: Dr. Celine Mans.  Indications: Chronic respiratory failure and need for ongoing mechanical ventilation.  Consent: Signed in chart  Preprocedure: Universal protocol was followed for this procedure. Timeout performed. The patient's identity was verified by confirming the patient's wrist band for name, date of birth, and medical record number. Everyone in the room was in agreement with the patient identify, the procedure to be performed, consent was in place and matched the planned procedure, and the procedure site. The area was cleaned with a CHG scrub and draped with large sterile barrier. Hand hygiene was performed, and cap, mask, sterile gown, and sterile gloves were worn. The patient was covered by a large sterile drape. Sterile technique was maintained for the entire procedure.  Anesthesia: The patient was intubated and sedated prior to the procedure. Additional midazolam, etomidate, fentanyl and vecuronium were given for sedation and paralysis with close attention to vital signs throughout procedure.   Procedure: The patient was placed in the supine position. The anterior neck was prepped and draped in usual sterile fashion. 1% lidocaine was administered approximately 2 fingerbreadths above the sternal notch for local anesthesia. A 1.5-cm vertical incision was then performed 2 fingerbreadths above the sternal notch. Using a curved Kelly, blunt dissection was performed down to the level of the pretracheal fascia. At this point, the bronchoscope was introduced through the endotracheal tube and the trachea was properly visualized. The endotracheal tube was then gradually withdrawn within the trachea under direct bronchoscopic visualization. Proper midline position was confirmed by bouncing the needle from the tracheostomy tray over the trachea with bronchoscopic examination. The needle was advanced into the  trachea and proper positioning was confirmed with direct visualization. The needle was then removed leaving a white outer cannula in position. The wire from the tracheostomy tray was then advanced through the white outer cannula. The cannula was then removed. The small, blue dilator was then advanced over the wire into the trachea. Once proper dilatation was achieved, the dilator was removed. The large, tapered dilator was then advanced over the wire into the trachea. The dilator was removed leaving the wire and white inner cannula in position. A number 6-0 percutaneous Shiley tracheostomy tube was then advanced over the wire and white inner cannula into the trachea. Proper positioning was confirmed with bronchoscopic visualization. The tracheostomy tube was then sutured in place with four nylon sutures. It was further secured with a tracheostomy tie.  Estimated blood loss: Less than 5 mL.  Complications: None immediate.  CXR ordered.

## 2020-03-06 NOTE — Progress Notes (Signed)
eLink Physician-Brief Progress Note Patient Name: Bruce Webb DOB: [birth year] MRN: [id number]   Date of Service  03/06/2020  HPI/Events of Note  Precedex D/C earlier d/t pause in heart rhythm. Request for PRN Fentanyl IV.   eICU Interventions  Will order: 1. Fentanyl 25-50 mcg IV Q 2 hours PRN pain.      Intervention Category Major Interventions: Other:  Bruce Webb 03/06/2020, 12:34 AM

## 2020-03-06 NOTE — Progress Notes (Signed)
NAME:  Bruce Webb, MRN:  [id number], DOB:  07/06/1941, LOS: 20 ADMISSION DATE:  02/15/2020, CONSULTATION DATE:  02/19/2020 REFERRING MD:  Dr. Karleen Hampshire, CHIEF COMPLAINT:  Hypotension/ hypoxia    Brief History   79 year old male post COVID and PE in January on Eliquis admitted 2/21 to [facility] for extertional dyspnea found to be anemic with hemoccult positive stools.  GI consulted and transfused PRBC with since stabilized Hgb but has had persistent higher O2 requirements.  Was diuresed but today developed hypotension, with ongoing higher O2 requirements, PCCM was asked to consult.     History of present illness   HPI obtained via interpreter and from daughter.   79 year old male, who speaks [place], with history of HTN, HFpEF, DMT2, glaucoma with recent COVID 01/16/2020 with subsequent subacute PE on Eliquis and home O2 2L [place] who presented 2/21 with exertional dyspnea, black tarry stools, and worsening vision loss in his right eye.  Patient lives at home with his daughter and pre-COVID was independent in his ADLs.  He is a retired and prior worked in an office.  Denies any smoking or pulmonary history.  Reports prior to COVID, had a chronic non-productive cough for 10-15 years and his primary care MD treated it occasionally with cough medicine.   Patient with recent hospitalization at [facility] for [place] after initial positive 1/22 for failed outpatient treatment for COVID infection despite monoclonal antibodies and steroids.  While at [facility], treated with remdesivir and decadron and found to have a PE and was discharged home on 2/6 on 2L O2 and Eliquis.   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    On admit, found to have worsening anemia, 8.8-> 6 with positive fecal occult blood testing and hypotension responsive to fluid.  Additionally found to have WBC 28k, CXR with worsening multifocal airspace consolidation bilaterally.  He was started  empirically on zithromax and cefepime. PCT 0.5.  He was transfused with 2 units PRBC, started on protonix, and GI consulted. However, given tenuous respiratory status and increased O2 needs, GI deferred EGD.  Hgb trend since stable and therefore placed on heparin gtt on 2/23.  He was diuresed on 2/23 given fluid overload post blood transfusion with good output then but has since required midodrine to help with soft blood pressures.  On 2/25, he had worsening hypotension despite midodrine, ongoing elevated O2 needs, and transaminitis, PCCM asked to consult.     Past Medical History  HTN, HFpEF, DMT2, glaucoma with progressive vision loss (left, now rapid loss in right) - recent COVID 01/16/2020 with subsequent PE on Eliquis and O2 2L [place]  Significant Hospital Events   2/21 admit. Stool ob =negatoive  2/23- echo ef 65%  2/25 PCCM consulted for hypotension and persistent hypoxemia  2/26 - Has severe desaturations with activity and eating. Required 10L while eating his meal today. No acute distress but continues to have shortness of breath. Denies chest pain.   2/27 -  -remains on 10 L.  He easily desaturates.  [name] at the bedside.  I spoke to her and subsequently also to their family friend who is in physician assistant in cardiology [place]).  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  According to the daughter he got worse with COVID-19 then got better went home but he declined again in terms of his hypoxemia.   Consults:  GI  PCCM  Procedures:   Significant Diagnostic Tests:  2/21 CTH >> 1. No acute intracranial abnormalities. 2. Mild cerebral atrophy with chronic microvascular ischemic changes in the cerebral white matter, as above.  CTA 01/27/20 - Diffuse subpleural/peripheral interstitial ground glass opacities with increased involvement in the the lower lobes bilaterally R>L. CXR 02/16/20 R>L multifocal pneumonia R>L, no edema or  effusion  2/23 TTE >> 1. Left ventricular ejection fraction, by estimation, is 60 to 65%. The left ventricle has normal function. The left ventricle has no regional wall motion abnormalities. Left ventricular diastolic parameters are consistent with Grade II diastolic dysfunction (pseudonormalization).  2. The history indicates the patient has a pulmonary embolus. The RV is  not dilated and there is normal RV function. . Right ventricular systolic function is low normal. The right ventricular size is normal.  3. The mitral valve is normal in structure and function. Mild mitral valve regurgitation. No evidence of mitral stenosis.  4. The aortic valve is normal in structure and function. Aortic valve regurgitation is not visualized. No aortic stenosis is present.   Micro Data:  2/21 BCx 2 >> ngtd x 4 days 2/25 - CCP 2/27 - quant gold -  2/27 RVP - negative 2/28  - urine strep - negative 228 - urine leg -  2/28 - aspergillus Ab -  xxxxxxxxxxxx 3/1 BAL Cx, fungal, acid fast >> negative 3/1 BAL PCP>> negative 3/ blood cultures - ngtd Antimicrobials:  2/21 azithro>>2/26 2/21 ceftriaxone  2/22 cefepime >> 2/28 Bactrim >3/4 3/1 Flagyl> 3/2 Voriconazole >> 3/4 Zyvox  Interim history/subjective:  Did not pass SBT yesterday. No overnight issues. Follow commands this morning off precedex.  Objective   Blood pressure 118/66, pulse (!) 101, temperature 98 F (36.7 C), temperature source Oral, resp. rate (!) 25, height _0  (1.575 m), weight 65.5 kg, SpO2 100 %.    Vent Mode: PCV FiO2 (%):  [40 %] 40 % Set Rate:  [28 bmp] 28 bmp PEEP:  [5 cmH20] 5 cmH20 Plateau Pressure:  [22 cmH20-40 cmH20] 40 cmH20   Intake/Output Summary (Last 24 hours) at 03/06/2020 0903 Last data filed at 03/06/2020 0800 Gross per 24 hour  Intake 1139.64 ml  Output 1850 ml  Net -710.36 ml   Filed Weights   03/04/20 0433 03/05/20 0421 03/06/20 0335  Weight: 66.6 kg 67.6 kg 65.5 kg   Physical  Exam: General: Chronically ill-appearing, no acute distress, looks comfortable. HENT: [place], AT, ETT in place Eyes: EOMI, no scleral icterus Respiratory: Clear to auscultation bilaterally.  No crackles, wheezing or rales Cardiovascular: RRR, -M/R/G, no JVD GI: BS+, soft, nontender Extremities:-Edema,-tenderness Neuro: Opens eyes to voice, grimaces to pain, moves extremities x 4. Follows commands GU: Foley in place  Resolved Hospital Problem list   Transaminitis -resolved.worsening likely in setting of shock. RUQ unrevealing  Assessment & Plan:  79 year old male never smoker with prior COVID-19 infection diagnosed in January and requiring hospitalization from 2/1-2/6, recently diagnosed pulmonary emboli on anticoagulation and end-stage glaucoma with right vision loss in addition to his known left vision loss who presents with worsening shortness of breath and admitted for acute on chronic respiratory failure secondary to multifocal pneumonia. Has had required high O2 requirements during the course of this hospitalization despite steroid therapy and transferred to the ICU on 2/28.  ARDS/Acute hypoxemic respiratory failure secondary to cavitary pneumonia and post COVID lung disease requiring mechanical ventilation. -Daily SBT and SAT.  Failing SBT's, intermittently following commands. - Unable to be  extubated.  He is a prolonged ventilator wean.  He will require tracheostomy, planned for today. - lung protective ventilation as tolerated - he is significantly positive fluid balance, now that Blood pressure is better will start with gentle diuresis  Cavitary pneumonia of unclear etiology. Most likely bacterial - anaerobic from oral organisms.  Aspergillus Ab positive, IgG negative. TB negative. Finished course of Amp-sulbactam will finish augmentin per ID recs for additional 2-3 weeks (approx end time 4/3).  Avoiding unasyn due to hypernatremia.  Agree with discontinuation of voriconazole. Appreciate  ID input. -repeat chest demonstrates redemonstration of this cavitary lesion, as well as worsening airspace opacities bilateral upper lobes.   Agitation and delirium - stopped precedex for vagal response with suctioning last night - he is comfortable without sedation presently. Will maintain.   AKI with hyperkalemia --Monitor UOP/Cr -Avoiding contrast unless absolutely necessary.  Small bilateral pulmonary emboli which may represent thrombosis in situ from pneumonia. --discontinue heparin gtt. Will switch to eliquis after trach.  --Trend CBC with hx of melena prior to admission  Acute blood loss anemia, melena.  S/p 2U PRBC. Hg stable with no further transfusions --Trend CBC --PPI daily  Hyponatremia - improved --Trend BMP daily  Type 2 diabetes mellitus -SSI - blood sugars controlled  Chronic diastolic heart failure - diuresis as above  Acute urinary retention 2/27 -Foley in place   Daily Goals Checklist  Pain/Anxiety/Delirium protocol (if indicated): Precedex VAP protocol (if indicated): bundle in place. Respiratory support goals: Daily SBT as tolerated.  Plan is for tracheostomy Saturday DVT prophylaxis: IV heparin Nutrition: tube feeds, at goal GI prophylaxis: pantoprazole. Urinary catheter: Condom. Central line: PICC Glucose control: SSI - euglycemic  Mobility: PT/OT consulted, advance as tolerated Code Status: full Family Communication:discussing plan of care daily with family at bedside.  Disposition: ICU.  LABS   Recent Labs  Lab 02/29/20 1140  PHART 7.361  PCO2ART 34.1  PO2ART 54.0*  HCO3 19.3*  TCO2 20*  O2SAT 87.0    CBC Recent Labs  Lab 03/03/20 0448 03/05/20 0928 03/06/20 0511  HGB 8.5* 7.6* 7.8*  HCT 27.6* 25.8* 26.2*  WBC 27.5* 19.7* 17.3*  PLT 252 193 190    COAGULATION Recent Labs  Lab 03/06/20 0827  INR 1.1    CARDIAC  No results for input(s): TROPONINI in the last 168 hours. No results for input(s): PROBNP in the  last 168 hours.   CHEMISTRY Recent Labs  Lab 02/29/20 0526 02/29/20 0526 02/29/20 1140 02/29/20 1140 03/03/20 0939 03/03/20 0939 03/05/20 1130 03/06/20 0511  NA 137  --  136  --  145  --  153* 155*  K 5.0   < > 4.9   < > 4.0   < > 3.8 3.7  CL 110  --   --   --  116*  --  122* 121*  CO2 20*  --   --   --  22  --  24 27  GLUCOSE 122*  --   --   --  191*  --  203* 120*  BUN 86*  --   --   --  68*  --  51* 53*  CREATININE 1.50*  --   --   --  1.11  --  0.94 0.92  CALCIUM 7.4*  --   --   --  7.3*  --  7.5* 7.6*   < > = values in this interval not displayed.   Estimated Creatinine Clearance: 51.1 mL/min (by C-G formula  based on SCr of 0.92 mg/dL).   LIVER Recent Labs  Lab 03/06/20 0827  INR 1.1     INFECTIOUS No results for input(s): LATICACIDVEN, PROCALCITON in the last 168 hours.   ENDOCRINE CBG (last 3)  Recent Labs    03/06/20 0021 03/06/20 0446 03/06/20 0744  GLUCAP 208* 112* 97   CRITICAL CARE The patient is critically ill with multiple organ systems failure and requires high complexity decision making for assessment and support, frequent evaluation and titration of therapies, application of advanced monitoring technologies and extensive interpretation of multiple databases.   Critical Care Time devoted to patient care services described in this note is 36 minutes. This time reflects time of care of this [place] . This critical care time does not reflect separately billable procedures or procedure time, teaching time or supervisory time of PA/NP/Med student/Med Resident etc but could involve care discussion time.  [facility] 03/06/2020 9:03 AM  Pager: [phone] After hours pager: [phone]

## 2020-03-06 NOTE — Progress Notes (Signed)
RT assisted Dr. Katrinka Blazing and Celine Mans in trach insertion. No complications noted. Extra trach/ inner cannula are at bedside.

## 2020-03-07 LAB — BASIC METABOLIC PANEL
Anion gap: 8 (ref 5–15)
BUN: 58 mg/dL — ABNORMAL HIGH (ref 8–23)
CO2: 28 mmol/L (ref 22–32)
Calcium: 7.5 mg/dL — ABNORMAL LOW (ref 8.9–10.3)
Chloride: 120 mmol/L — ABNORMAL HIGH (ref 98–111)
Creatinine, Ser: 1.06 mg/dL (ref 0.61–1.24)
GFR calc Af Amer: >60 mL/min (ref >60)
GFR calc non Af Amer: >60 mL/min (ref >60)
Glucose, Bld: 240 mg/dL — ABNORMAL HIGH (ref 70–99)
Potassium: 3.8 mmol/L (ref 3.5–5.1)
Sodium: 156 mmol/L — ABNORMAL HIGH (ref 135–145)

## 2020-03-07 LAB — CBC
HCT: 24.5 % — ABNORMAL LOW (ref 39.0–52.0)
Hemoglobin: 7 g/dL — ABNORMAL LOW (ref 13.0–17.0)
MCH: 29.4 pg (ref 26.0–34.0)
MCHC: 28.6 g/dL — ABNORMAL LOW (ref 30.0–36.0)
MCV: 102.9 fL — ABNORMAL HIGH (ref 80.0–100.0)
Platelets: 160 10*3/uL (ref 150–400)
RBC: 2.38 MIL/uL — ABNORMAL LOW (ref 4.22–5.81)
RDW: 23.9 % — ABNORMAL HIGH (ref 11.5–15.5)
WBC: 14.8 10*3/uL — ABNORMAL HIGH (ref 4.0–10.5)
nRBC: 0 % (ref 0.0–0.2)

## 2020-03-07 LAB — GLUCOSE, CAPILLARY
Glucose-Capillary: 194 mg/dL — ABNORMAL HIGH (ref 70–99)
Glucose-Capillary: 222 mg/dL — ABNORMAL HIGH (ref 70–99)
Glucose-Capillary: 169 mg/dL — ABNORMAL HIGH (ref 70–99)
Glucose-Capillary: 152 mg/dL — ABNORMAL HIGH (ref 70–99)
Glucose-Capillary: 147 mg/dL — ABNORMAL HIGH (ref 70–99)

## 2020-03-07 MED ORDER — INSULIN ASPART 100 UNIT/ML ~~LOC~~ SOLN: 4.0000 [IU] | SUBCUTANEOUS | Status: DC

## 2020-03-07 MED ORDER — INSULIN ASPART 100 UNIT/ML ~~LOC~~ SOLN
4.0000 [IU] | SUBCUTANEOUS | Status: DC
  Administered 2020-03-07 – 2020-03-11 (×20): 4 [IU] via SUBCUTANEOUS

## 2020-03-07 MED ORDER — FREE WATER
200.0000 mL | Status: DC
  Administered 2020-03-07 – 2020-03-10 (×19): 200 mL

## 2020-03-07 MED ORDER — FENTANYL CITRATE (PF) 100 MCG/2ML IJ SOLN
50.0000 ug | INTRAMUSCULAR | Status: DC | PRN
  Administered 2020-03-09 – 2020-03-12 (×6): 50 ug via INTRAVENOUS
  Filled 2020-03-07 (×6): qty 2

## 2020-03-07 MED ORDER — POTASSIUM CHLORIDE 20 MEQ/15ML (10%) PO SOLN
20.0000 meq | Freq: Once | ORAL | Status: AC
  Administered 2020-03-07: 20 meq
  Filled 2020-03-07: qty 15

## 2020-03-07 NOTE — Progress Notes (Signed)
SLP Cancellation Note  Patient Details Name: Bruce Webb MRN: [id number] DOB: 08-12-1941   Cancelled treatment:        Patient still on ventilator. Will monitor and evaluate when pt begins ATC trials.    Luis Abed., MA CCC-SLP 03/07/2020, 9:03 AM

## 2020-03-07 NOTE — Progress Notes (Signed)
NAME:  Bruce Webb, MRN:  [id number], DOB:  Apr 25, 1941, LOS: 21 ADMISSION DATE:  02/15/2020, CONSULTATION DATE:  02/19/2020 REFERRING MD:  Dr. Karleen Hampshire, CHIEF COMPLAINT:  Hypotension/ hypoxia    Brief History   79 year old male post COVID and PE in January on Eliquis admitted 2/21 to [facility] for extertional dyspnea found to be anemic with hemoccult positive stools.  GI consulted and transfused PRBC with since stabilized Hgb but has had persistent higher O2 requirements.  Was diuresed but today developed hypotension, with ongoing higher O2 requirements, PCCM was asked to consult.     History of present illness   HPI obtained via interpreter and from daughter.   79 year old male, who speaks [place], with history of HTN, HFpEF, DMT2, glaucoma with recent COVID 01/16/2020 with subsequent subacute PE on Eliquis and home O2 2L [place] who presented 2/21 with exertional dyspnea, black tarry stools, and worsening vision loss in his right eye.  Patient lives at home with his daughter and pre-COVID was independent in his ADLs.  He is a retired and prior worked in an office.  Denies any smoking or pulmonary history.  Reports prior to COVID, had a chronic non-productive cough for 10-15 years and his primary care MD treated it occasionally with cough medicine.   Patient with recent hospitalization at [facility] for [place] after initial positive 1/22 for failed outpatient treatment for COVID infection despite monoclonal antibodies and steroids.  While at [facility], treated with remdesivir and decadron and found to have a PE and was discharged home on 2/6 on 2L O2 and Eliquis.   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    On admit, found to have worsening anemia, 8.8-> 6 with positive fecal occult blood testing and hypotension responsive to fluid.  Additionally found to have WBC 28k, CXR with worsening multifocal airspace consolidation bilaterally.  He was started  empirically on zithromax and cefepime. PCT 0.5.  He was transfused with 2 units PRBC, started on protonix, and GI consulted. However, given tenuous respiratory status and increased O2 needs, GI deferred EGD.  Hgb trend since stable and therefore placed on heparin gtt on 2/23.  He was diuresed on 2/23 given fluid overload post blood transfusion with good output then but has since required midodrine to help with soft blood pressures.  On 2/25, he had worsening hypotension despite midodrine, ongoing elevated O2 needs, and transaminitis, PCCM asked to consult.     Past Medical History  HTN, HFpEF, DMT2, glaucoma with progressive vision loss (left, now rapid loss in right) - recent COVID 01/16/2020 with subsequent PE on Eliquis and O2 2L [place]  Significant Hospital Events   2/21 admit. Stool ob =negatoive  2/23- echo ef 65%  2/25 PCCM consulted for hypotension and persistent hypoxemia  2/26 - Has severe desaturations with activity and eating. Required 10L while eating his meal today. No acute distress but continues to have shortness of breath. Denies chest pain.   2/27 -  -remains on 10 L.  He easily desaturates.  [name] at the bedside.  I spoke to her and subsequently also to their family friend who is in physician assistant in cardiology [place]).  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  According to the daughter he got worse with COVID-19 then got better went home but he declined again in terms of his hypoxemia.   Consults:  GI  PCCM  Procedures:   Significant Diagnostic Tests:  2/21 CTH >> 1. No acute intracranial abnormalities. 2. Mild cerebral atrophy with chronic microvascular ischemic changes in the cerebral white matter, as above.  CTA 01/27/20 - Diffuse subpleural/peripheral interstitial ground glass opacities with increased involvement in the the lower lobes bilaterally R>L. CXR 02/16/20 R>L multifocal pneumonia R>L, no edema or  effusion  2/23 TTE >> 1. Left ventricular ejection fraction, by estimation, is 60 to 65%. The left ventricle has normal function. The left ventricle has no regional wall motion abnormalities. Left ventricular diastolic parameters are consistent with Grade II diastolic dysfunction (pseudonormalization).  2. The history indicates the patient has a pulmonary embolus. The RV is  not dilated and there is normal RV function. . Right ventricular systolic function is low normal. The right ventricular size is normal.  3. The mitral valve is normal in structure and function. Mild mitral valve regurgitation. No evidence of mitral stenosis.  4. The aortic valve is normal in structure and function. Aortic valve regurgitation is not visualized. No aortic stenosis is present.   Micro Data:  2/21 BCx 2 >> ngtd x 4 days 2/25 - CCP 2/27 - quant gold -  2/27 RVP - negative 2/28  - urine strep - negative 228 - urine leg -  2/28 - aspergillus Ab -  xxxxxxxxxxxx 3/1 BAL Cx, fungal, acid fast >> negative 3/1 BAL PCP>> negative 3/ blood cultures - ngtd Antimicrobials:  2/21 azithro>>2/26 2/21 ceftriaxone  2/22 cefepime >> 2/28 Bactrim >3/4 3/1 Flagyl> 3/2 Voriconazole >> 3/4 Zyvox  Interim history/subjective:  Did not pass SBT yesterday. No overnight issues.  Objective   Blood pressure (!) 126/55, pulse 77, temperature 98.7 F (37.1 C), temperature source Axillary, resp. rate (!) 28, height _0  (1.575 m), weight 65.5 kg, SpO2 99 %.    Vent Mode: CPAP;PSV FiO2 (%):  [40 %] 40 % Set Rate:  [28 bmp] 28 bmp PEEP:  [5 cmH20] 5 cmH20 Pressure Support:  [8 cmH20] 8 cmH20 Plateau Pressure:  [24 cmH20-39 cmH20] 24 cmH20   Intake/Output Summary (Last 24 hours) at 03/07/2020 0856 Last data filed at 03/07/2020 0800 Gross per 24 hour  Intake 1254.06 ml  Output 2075 ml  Net -820.94 ml   Filed Weights   03/04/20 0433 03/05/20 0421 03/06/20 0335  Weight: 66.6 kg 67.6 kg 65.5 kg   Physical  Exam: General: Chronically ill-appearing, awake,  HENT: 6.0 shiley trach to vent.  Respiratory: Clear to auscultation bilaterally.  No crackles, wheezing or rales Cardiovascular: RRR, -M/R/G, no JVD GI: BS+, soft, nontender Neuro: Opens eyes to voice, grimaces to pain, moves extremities x 4. Follows commands intermittently GU: Foley in place  Resolved Hospital Problem list   Transaminitis -resolved.worsening likely in setting of shock. RUQ unrevealing Acute blood loss anemia AKI with hyperkalemia Acute urinary retention   Assessment & Plan:  79 year old male never smoker with prior COVID-19 infection diagnosed in January and requiring hospitalization from 2/1-2/6, recently diagnosed pulmonary emboli on anticoagulation and end-stage glaucoma with right vision loss in addition to his known left vision loss who presents with worsening shortness of breath and admitted for acute on chronic respiratory failure secondary to multifocal pneumonia. Has had required high O2 requirements during the course of this hospitalization despite steroid therapy and transferred to the ICU on 2/28.  ARDS/Acute hypoxemic respiratory failure secondary to cavitary pneumonia and post COVID lung disease requiring mechanical ventilation. -Daily SBT and SAT.  Passing today. Trach 3/13.  -  plan for trach collar trials as tolerated. Briefly mentioned PEG tube with son yesterday. May need this week.  Cavitary pneumonia of unclear etiology. Most likely bacterial - anaerobic from oral organisms. Relative immunosuppression from [place] may have led to aspergillus. TB negative. -repeat chest demonstrates redemonstration of this cavitary lesion, as well as worsening airspace opacities bilateral upper lobes.  - discussed with ID - onVori for + aspergillus Ag negative, Ab positive. discontinuation of voriconazole. - augmentin, repeat CT scan in a few weeks  Hypernatremia - holding diuresis - adding free H20 flushes --Trend BMP  daily  Type 2 diabetes mellitus -SSI - blood sugars high today. Will add tube feed coverage  Agitation and delirium -Exacerbated by loss of vision - intermittently follows commands. Suspect delirium. Non pharmacologic delirium precauition  Acute Bilateral pulmonary emboli --eliquis. Provoked in the setting of covid.  Chronic diastolic heart failure - holding diuresis as above  Daily Goals Checklist  Pain/Anxiety/Delirium protocol (if indicated): Precedex VAP protocol (if indicated): bundle in place. Respiratory support goals: Daily SBT as tolerated.  Plan is for tracheostomy Saturday DVT prophylaxis: IV heparin Nutrition: tube feeds, at goal GI prophylaxis: pantoprazole. Urinary catheter: Condom. Central line: PICC Glucose control: SSI - euglycemic  Mobility: PT/OT consulted, advance as tolerated Code Status: full Family Communication:Discussed plan of care in [place] with patient's son Ron Agee at bedside. Disposition: ICU.   LABS   Recent Labs  Lab 02/29/20 1140  PHART 7.361  PCO2ART 34.1  PO2ART 54.0*  HCO3 19.3*  TCO2 20*  O2SAT 87.0    CBC Recent Labs  Lab 03/05/20 0928 03/06/20 0511 03/07/20 0513  HGB 7.6* 7.8* 7.0*  HCT 25.8* 26.2* 24.5*  WBC 19.7* 17.3* 14.8*  PLT 193 190 160    COAGULATION Recent Labs  Lab 03/06/20 0827  INR 1.1    CARDIAC  No results for input(s): TROPONINI in the last 168 hours. No results for input(s): PROBNP in the last 168 hours.   CHEMISTRY Recent Labs  Lab 02/29/20 1140 02/29/20 1140 03/03/20 0939 03/03/20 0939 03/05/20 1130 03/05/20 1130 03/06/20 0511 03/07/20 0513  NA 136  --  145  --  153*  --  155* 156*  K 4.9   < > 4.0   < > 3.8   < > 3.7 3.8  CL  --   --  116*  --  122*  --  121* 120*  CO2  --   --  22  --  24  --  27 28  GLUCOSE  --   --  191*  --  203*  --  120* 240*  BUN  --   --  68*  --  51*  --  53* 58*  CREATININE  --   --  1.11  --  0.94  --  0.92 1.06  CALCIUM  --   --  7.3*  --  7.5*   --  7.6* 7.5*   < > = values in this interval not displayed.   Estimated Creatinine Clearance: 44.4 mL/min (by C-G formula based on SCr of 1.06 mg/dL).   LIVER Recent Labs  Lab 03/06/20 0827  INR 1.1     INFECTIOUS No results for input(s): LATICACIDVEN, PROCALCITON in the last 168 hours.   ENDOCRINE CBG (last 3)  Recent Labs    03/06/20 2320 03/07/20 0425 03/07/20 0800  GLUCAP 179* 194* 222*   CRITICAL CARE The patient is critically ill with multiple organ systems failure and requires high complexity decision  making for assessment and support, frequent evaluation and titration of therapies, application of advanced monitoring technologies and extensive interpretation of multiple databases.   Critical Care Time devoted to patient care services described in this note is 37 minutes. This time reflects time of care of this [place] . This critical care time does not reflect separately billable procedures or procedure time, teaching time or supervisory time of PA/NP/Med student/Med Resident etc but could involve care discussion time.  [facility] 03/07/2020 8:56 AM  Pager: [phone] After hours pager: [phone]

## 2020-03-08 DIAGNOSIS — J9621 Acute and chronic respiratory failure with hypoxia: Secondary | ICD-10-CM

## 2020-03-08 LAB — CBC
HCT: 23.3 % — ABNORMAL LOW (ref 39.0–52.0)
Hemoglobin: 6.5 g/dL — CL (ref 13.0–17.0)
MCH: 28.9 pg (ref 26.0–34.0)
MCHC: 27.9 g/dL — ABNORMAL LOW (ref 30.0–36.0)
MCV: 103.6 fL — ABNORMAL HIGH (ref 80.0–100.0)
Platelets: 141 10*3/uL — ABNORMAL LOW (ref 150–400)
RBC: 2.25 MIL/uL — ABNORMAL LOW (ref 4.22–5.81)
RDW: 23.5 % — ABNORMAL HIGH (ref 11.5–15.5)
WBC: 16.4 10*3/uL — ABNORMAL HIGH (ref 4.0–10.5)
nRBC: 0.1 % (ref 0.0–0.2)

## 2020-03-08 LAB — GLUCOSE, CAPILLARY
Glucose-Capillary: 129 mg/dL — ABNORMAL HIGH (ref 70–99)
Glucose-Capillary: 138 mg/dL — ABNORMAL HIGH (ref 70–99)
Glucose-Capillary: 145 mg/dL — ABNORMAL HIGH (ref 70–99)
Glucose-Capillary: 146 mg/dL — ABNORMAL HIGH (ref 70–99)
Glucose-Capillary: 147 mg/dL — ABNORMAL HIGH (ref 70–99)
Glucose-Capillary: 169 mg/dL — ABNORMAL HIGH (ref 70–99)
Glucose-Capillary: 172 mg/dL — ABNORMAL HIGH (ref 70–99)

## 2020-03-08 LAB — BASIC METABOLIC PANEL
Anion gap: 7 (ref 5–15)
BUN: 67 mg/dL — ABNORMAL HIGH (ref 8–23)
CO2: 27 mmol/L (ref 22–32)
Calcium: 7.3 mg/dL — ABNORMAL LOW (ref 8.9–10.3)
Chloride: 120 mmol/L — ABNORMAL HIGH (ref 98–111)
Creatinine, Ser: 1.1 mg/dL (ref 0.61–1.24)
GFR calc Af Amer: >60 mL/min (ref >60)
GFR calc non Af Amer: >60 mL/min (ref >60)
Glucose, Bld: 190 mg/dL — ABNORMAL HIGH (ref 70–99)
Potassium: 3.8 mmol/L (ref 3.5–5.1)
Sodium: 154 mmol/L — ABNORMAL HIGH (ref 135–145)

## 2020-03-08 LAB — PREPARE RBC (CROSSMATCH)

## 2020-03-08 MED ORDER — ENOXAPARIN SODIUM 80 MG/0.8ML ~~LOC~~ SOLN
1.0000 mg/kg | Freq: Two times a day (BID) | SUBCUTANEOUS | Status: DC
  Administered 2020-03-08 – 2020-03-10 (×5): 65 mg via SUBCUTANEOUS
  Filled 2020-03-08 (×7): qty 0.65

## 2020-03-08 MED ORDER — SODIUM CHLORIDE 0.9% IV SOLUTION: Freq: Once | INTRAVENOUS | Status: AC

## 2020-03-08 MED ORDER — VITAL AF 1.2 CAL PO LIQD
1000.0000 mL | ORAL | Status: DC
  Administered 2020-03-08 – 2020-03-15 (×3): 1000 mL

## 2020-03-08 NOTE — Progress Notes (Signed)
CRITICAL VALUE ALERT  Critical Value:  hgb 6.5  Date & Time Notied:  03/08/20 0545  Provider Notified: Pola Corn  Orders Received/Actions taken: see new orders

## 2020-03-08 NOTE — Progress Notes (Signed)
NAME:  Bruce Webb, MRN:  [id number], DOB:  Nov 25, 1941, LOS: 19 ADMISSION DATE:  02/15/2020, CONSULTATION DATE:  02/19/2020 REFERRING MD:  Dr. Karleen Hampshire, CHIEF COMPLAINT:  Hypotension/ hypoxia    Brief History   79 year old male post COVID and PE in January on Eliquis admitted 2/21 to [facility] for extertional dyspnea found to be anemic with hemoccult positive stools.  GI consulted and transfused PRBC with since stabilized Hgb but has had persistent higher O2 requirements.  Was diuresed but  developed hypotension, with ongoing higher O2 requirements, PCCM was asked to consult.      Past Medical History  HTN, HFpEF, DMT2, glaucoma with progressive vision loss (left, now rapid loss in right) - recent COVID 01/16/2020 with subsequent PE on Eliquis and O2 2L [place]  Significant Hospital Events   2/21 admit. Stool ob =negatoive 2/23- echo ef 65% 2/25 PCCM consulted for hypotension and persistent hypoxemia 2/26 - Has severe desaturations with activity and eating. Required 10L while eating his meal today. No acute distress but continues to have shortness of breath. Denies chest pain.  2/27 -  -remains on 10 L.  He easily desaturates.  [name] at the bedside.  I spoke to her and subsequently also to their family friend who is in physician assistant in cardiology [place]).  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  According to the daughter he got worse with COVID-19 then got better went home but he declined again in terms of his hypoxemia.  2/28 moved to ICU, intubated. Bronch completed at bedside. Infectious disease consulted for cavitary PNA. Bactrim started for possible PCP 3/3 started on Voriconazole for + aspergillus ag 3/4 not weaning due to agitation 3/9 summary: treating for complicated cavitary PNA felt 2/2 e faecalis and aspergillus ag +  3/12 ID sign off note: stopping Voreconizole. Recommending 10-14 more days augmentin w/ f/u CT chest 2-3 weeks also  recommending MRI of brain at some point to r/o septic embolic disease 9/70 trach  Consults:  GI  PCCM  Procedures:   Significant Diagnostic Tests:  2/21 [facility] >> 1. No acute intracranial abnormalities. 2. Mild cerebral atrophy with chronic microvascular ischemic changes in the cerebral white matter, as above.  CTA 01/27/20 - Diffuse subpleural/peripheral interstitial ground glass opacities with increased involvement in the the lower lobes bilaterally R>L. CXR 02/16/20 R>L multifocal pneumonia R>L, no edema or effusion  2/23 [facility] >> 1. Left ventricular ejection fraction, by estimation, is 60 to 65%. The left ventricle has normal function. The left ventricle has no regional wall motion abnormalities. Left ventricular diastolic parameters are consistent with Grade II diastolic dysfunction (pseudonormalization).  2. The history indicates the patient has a pulmonary embolus. The RV is  not dilated and there is normal RV function. . Right ventricular systolic function is low normal. The right ventricular size is normal.  3. The mitral valve is normal in structure and function. Mild mitral valve regurgitation. No evidence of mitral stenosis.  4. The aortic valve is normal in structure and function. Aortic valve regurgitation is not visualized. No aortic stenosis is present.   Micro Data:  2/21 BCx 2 >> ngtd x 4 days 2/25 - CCP 2/27 - quant gold -  2/27 RVP - negative 2/28  - urine strep - negative 228 - urine leg -  2/28 - aspergillus Ab -  xxxxxxxxxxxx 3/1 BAL Cx, fungal, acid fast >> negative 3/1 BAL PCP>> negative 3/  blood cultures - ngtd 3/4 enterococcus Faecalis; few candida lusitaniana  Antimicrobials:  2/21 azithro>>2/26 2/21 ceftriaxone ->off 2/22 cefepime >>off 2/28 Bactrim >3/4 3/1 Flagyl>72f 3/2 Voriconazole >>3/12 3/4 Zyvox->off 3/12: augmentin (this was after 8 days of unasyn) Interim history/subjective:  Getting up to chair   Objective   Blood pressure 126/64,  pulse 70, temperature 98.4 F (36.9 C), temperature source Oral, resp. rate (Abnormal) 30, height _0  (1.575 m), weight 65.5 kg, SpO2 99 %.    Vent Mode: PSV;CPAP FiO2 (%):  [40 %-50 %] 40 % Set Rate:  [28 bmp] 28 bmp PEEP:  [5 cmH20] 5 cmH20 Pressure Support:  [8 cmH20-10 cmH20] 10 cmH20 Plateau Pressure:  [24 cmH20-39 cmH20] 31 cmH20   Intake/Output Summary (Last 24 hours) at 03/08/2020 1016 Last data filed at 03/08/2020 0930 Gross per 24 hour  Intake 2455 ml  Output 350 ml  Net 2105 ml   Filed Weights   03/06/20 0335 03/07/20 0500 03/08/20 0400  Weight: 65.5 kg 65.5 kg 65.5 kg   Physical Exam: General this is a chronically ill appearing 79year old [place] sitting up in chair no distress but did not tol PSV HENT NCAT no JVD MMM  pulm bibasilar rales no accessory use Card RRR abd not tender + bowel sounds Neuro very hard of hearing moves all ext gu cl yellow   Resolved Hospital Problem list   Transaminitis -resolved.worsening likely in setting of shock. RUQ unrevealing Acute blood loss anemia AKI with hyperkalemia Acute urinary retention   Assessment & Plan:  79year old male never smoker with prior COVID-19 infection diagnosed in January and requiring hospitalization from 2/1-2/6, recently diagnosed pulmonary emboli on anticoagulation and end-stage glaucoma with right vision loss in addition to his known left vision loss who presents with worsening shortness of breath and admitted for acute on chronic respiratory failure secondary to multifocal pneumonia. Has had required high O2 requirements during the course of this hospitalization despite steroid therapy and transferred to the ICU on 2/28.  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 in January) and Pulmonary emboli + aspergillus ag (completed course of Voriconazole) + E facealis  Plan Cont daily PSV; working  towards ABaker Hughes IncorporatedHe is on day 3/14 augmentin w/ plan to repeat CT chest in about 2 weeks from today Start cutting back on sedation (RASS goal 0 to -1) He will probably need LTAC  Pulmonary emboli Plan Changing to LMWH for planned PEG Then 3-6 months of DOAC depending on his mobility gains would be inclined to go towards 6  Hypernatremia, hyperchloremia  -improving Plan Cont to hold diuretics Cont free water  Acute metabolic encephalopathy w/ agitated delirium  Plan Cont supportive care Consider MRI brain at some point given visual loss  Vision loss Plan MRI at some point   Type 2 diabetes mellitus Plan ssi   Anemia and thrombocytopenia  hgb trending down. No acute blood loss noted. Suspect more critical illness related  Plan transfused F/u cbc OK to use LMWH  Daily Goals Checklist  Pain/Anxiety/Delirium protocol (if indicated): Precedex VAP protocol (if indicated): bundle in place. Respiratory support goals: Daily SBT as tolerated.  Plan is for tracheostomy Saturday DVT prophylaxis: IV heparin Nutrition: tube feeds, at goal GI prophylaxis: pantoprazole. Urinary catheter: Condom. Central line: PICC Glucose control: SSI - euglycemic  Mobility: PT/OT consulted, advance as tolerated Code Status: full Family Communication:Discussed plan of care  in [place] with patient's son Ron Agee at bedside. Disposition: ICU.-->needs LTAC eventually  My cct 32 minutes  Erick Colace ACNP-BC [place] Pager # [phone] OR # [phone] if no answer

## 2020-03-08 NOTE — Progress Notes (Signed)
Nutrition Follow-up  DOCUMENTATION CODES:   Not applicable  INTERVENTION:   Continue TF via NG tube:  Vital AF 1.2 change goal rate to 55 ml/h to provide 1584 kcal, 99 gm protein, 1071 ml free water daily.   NUTRITION DIAGNOSIS:   Inadequate oral intake related to altered GI function as evidenced by NPO status.  Ongoing   GOAL:   Patient will meet greater than or equal to 90% of their needs  Met with TF  MONITOR:   Vent status, TF tolerance, Labs  ASSESSMENT:   79 yo male admitted with exertional dyspnea, anemia, hemoccult positive stools. PMH includes COVID and PE in January 2021, HTN, HF, DM-2, glaucoma.  Discussed patient in ICU rounds and with RN today. S/P tracheostomy 3/13. Remains on vent at this time.  Patient no longer receiving Voriconazole, so TF is no longer being held for 4 hours per day. Currently receiving Vital AF 1.2 at 70 ml/h via NG tube, tolerating well. Free water flushes 200 ml every 4 hours.  Patient is currently intubated on ventilator support MV: 17 L/min Temp (24hrs), Avg:98 F (36.7 C), Min:97 F (36.1 C), Max:98.7 F (37.1 C)   Labs reviewed. Na 154 (H) CBG's: [phone]  Medications reviewed and include novolog, lantus, MVI.  Weight stable at 65.5 kg x 3 days.  Admission weight 52 kg. I/O +18.8 L  NUTRITION - FOCUSED PHYSICAL EXAM:    Most Recent Value  Orbital Region  No depletion  Upper Arm Region  No depletion  Thoracic and Lumbar Region  No depletion  Buccal Region  No depletion  Temple Region  Moderate depletion  Clavicle Bone Region  Mild depletion  Clavicle and Acromion Bone Region  Mild depletion  Scapular Bone Region  No depletion  Dorsal Hand  No depletion  Patellar Region  No depletion  Anterior Thigh Region  No depletion  Posterior Calf Region  No depletion  Edema (RD Assessment)  Severe  Hair  Reviewed  Eyes  Reviewed  Mouth  Unable to assess  Skin  Reviewed  Nails  Reviewed       Diet Order:    Diet Order            Diet NPO time specified  Diet effective midnight              EDUCATION NEEDS:   Not appropriate for education at this time  Skin:  Skin Assessment: Reviewed RN Assessment Skin Integrity Issues:: Stage II Stage II: N/A  Last BM:  3/15 rectal tube  Height:   Ht Readings from Last 1 Encounters:  02/24/20 _0  (1.575 m)    Weight:   Wt Readings from Last 1 Encounters:  03/08/20 65.5 kg    Ideal Body Weight:  53.6 kg  BMI:  Body mass index is 26.41 kg/m.  Estimated Nutritional Needs:   Kcal:  1590  Protein:  80-95 grams  Fluid:  > 1.6 L    Molli Barrows, RD, LDN, CNSC Please refer to Amion for contact information.

## 2020-03-08 NOTE — Progress Notes (Signed)
  Speech Language Pathology     Patient Details Name: Sherril Heyward MRN: [id number] DOB: 12-31-1940 Today's Date: 03/08/2020 Time:  -     Order received for PMV and BSE. Pt trach'd 3/13, on pressure support. If able to follow commands, would be appropriate for vent in-line PMV. Will follow.                             Royce Macadamia 03/08/2020, 12:26 PM   Breck Coons Lonell Face.Ed Nurse, children's [phone] Office [phone]

## 2020-03-08 NOTE — Progress Notes (Addendum)
Physical Therapy Treatment Patient Details Name: Bruce Webb MRN: [id number] DOB: 06/26/1941 Today's Date: 03/08/2020    History of Present Illness 79 y.o. gentleman prior history of essential hypertension, type 2 diabetes mellitus, recent COVID-19 illness initially diagnosed on 01/16/2020. Patient was discharged from hospital on recent discharge from the hospital on 01/31/2020 on 2L of nasal cannula oxygen and had been found to have pulmonary embolism. 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 He presented to the emergency department with exertional dyspnea, melena, and progressive vision loss involving the right eye. Chest x-ray of 2/22: asymmetric airspace disease, right greater than left. WBC increased on 2/24; afebrile.  Intubated 2/28 due to decline in medical status. TB has been ruled out.    PT Comments    Pt admitted with above diagnosis. Pt was able to sit EOB for 10 min with mod to max assist.  PT and OT transferred pt to drop arm recliner using pads to assist with transfer with pt minimally weight bearing on his LEs.   Pts son present during session. Pt making some progress.  Pt squeezed hands to commands flexing fingers with delay in response as well as inconsistent response to yes not questions.  Pt currently with functional limitations due to balance and endurance deficits.Vent set at 40% FiO2 and PEEP 5 with VSS.  Used Runner, broadcasting/film/video [id number].   Pt will benefit from skilled PT to increase their independence and safety with mobility to allow discharge to the venue listed below.     Follow Up Recommendations  LTACH;Supervision/Assistance - 24 hour     Equipment Recommendations  Other (comment)(TBD)    Recommendations for Other Services       Precautions / Restrictions Precautions Precautions: Fall Precaution Comments: watch O2 Restrictions Weight Bearing Restrictions: No    Mobility  Bed Mobility Overal bed mobility: Needs Assistance Bed  Mobility: Supine to Sit     Supine to sit: Max assist;+2 for physical assistance;HOB elevated     General bed mobility comments: max A +2 to total for all aspects of bed mobility, use of pad to facilitate movement  Transfers Overall transfer level: Needs assistance Equipment used: 2 person hand held assist Transfers: Sit to/from [employer] Sit to Stand: Total assist;+2 physical assistance;+2 safety/equipment;From elevated surface   Squat pivot transfers: Total assist;+2 physical assistance;+2 safety/equipment;From elevated surface     General transfer comment: use of pad to scoop, facilitate weight shift and squat pivot to chair  Ambulation/Gait                 Stairs             Wheelchair Mobility    Modified Rankin (Stroke Patients Only)       Balance Overall balance assessment: Needs assistance Sitting-balance support: No upper extremity supported;Feet supported Sitting balance-Leahy Scale: Poor Sitting balance - Comments: Pt needed max assist initially progressing to min assist to sit EOB with pt leaning posteriorly and not using his hands to hold himself up. Sat EOB for up to 10 minutes.    Standing balance support: Bilateral upper extremity supported;During functional activity Standing balance-Leahy Scale: Zero                              Cognition Arousal/Alertness: Awake/alert Behavior During Therapy: Flat affect Overall Cognitive Status: Difficult to assess  General Comments: Pt able to follow commands ~50% with delay, son present and also ultilized Stratus Ipad Interpreter      Exercises General Exercises - Upper Extremity Digit Composite Flexion: AROM;Both;10 reps;Seated General Exercises - Lower Extremity Ankle Circles/Pumps: PROM;Both;5 reps;Supine Long Arc Quad: Both;5 reps;Seated;PROM;AAROM    General Comments General comments (skin integrity, edema, etc.):  VSS with vent 40% FiO2, PEEP 5      Pertinent Vitals/Pain Pain Assessment: Faces Faces Pain Scale: Hurts a little bit Pain Location: generalized Pain Descriptors / Indicators: Grimacing Pain Intervention(s): Limited activity within patient's tolerance;Monitored during session;Repositioned    Home Living Family/patient expects to be discharged to:: Private residence Living Arrangements: Spouse/significant other;Children Available Help at Discharge: Family Type of Home: House       Home Equipment: Gilmer Mor - single point Additional Comments: home set up gleaned from notes from previous admission    Prior Function Level of Independence: Independent with assistive device(s)      Comments: pt stays in basement apartment and has a bathroom attached to the bedroom. He denies using RW and per chart review used SPC occasionally. Pt's daughter and pt reports he has not been walking for ~1.5-2 months but also state he had been getting to the attached bathroom but has not had a BM in almost a week. (info obtained from PT eval)   PT Goals (current goals can now be found in the care plan section) Acute Rehab PT Goals Patient Stated Goal: none stated Progress towards PT goals: Progressing toward goals    Frequency    Min 3X/week      PT Plan Current plan remains appropriate;Frequency needs to be updated    Co-evaluation PT/OT/SLP Co-Evaluation/Treatment: Yes Reason for Co-Treatment: Complexity of the patient's impairments (multi-system involvement);Necessary to address cognition/behavior during functional activity;For patient/therapist safety;To address functional/ADL transfers PT goals addressed during session: Mobility/safety with mobility OT goals addressed during session: ADL's and self-care;Strengthening/ROM      AM-PAC PT "6 Clicks" Mobility   Outcome Measure  Help needed turning from your back to your side while in a flat bed without using bedrails?: Total Help needed moving  from lying on your back to sitting on the side of a flat bed without using bedrails?: Total Help needed moving to and from a bed to a chair (including a wheelchair)?: Total Help needed standing up from a chair using your arms (e.g., wheelchair or bedside chair)?: Total Help needed to walk in hospital room?: Total Help needed climbing 3-5 steps with a railing? : Total 6 Click Score: 6    End of Session Equipment Utilized During Treatment: Other (comment);Gait belt(trach on vent) Activity Tolerance: Patient limited by fatigue Patient left: in bed;with call bell/phone within reach;with bed alarm set Nurse Communication: Mobility status;Need for lift equipment PT Visit Diagnosis: Unsteadiness on feet (R26.81);Other abnormalities of gait and mobility (R26.89);Muscle weakness (generalized) (M62.81)     Time: 1002-1030 PT Time Calculation (min) (ACUTE ONLY): 28 min  Charges:  $Therapeutic Activity: 8-22 mins                     Jaspal Pultz W,PT Acute Rehabilitation Services Pager:  [phone]  Office:  [phone]     Berline Lopes 03/08/2020, 1:54 PM

## 2020-03-08 NOTE — Progress Notes (Signed)
eLink Physician-Brief Progress Note Patient Name: Bruce Webb DOB: January 21, 1941 MRN: [id number]   Date of Service  03/08/2020  HPI/Events of Note  Hgb 6.5  eICU Interventions  Ordered T&S and 1u pRBCs transfusion. RN to obtain consent from family.     Intervention Category Intermediate Interventions: Other:  Janae Bridgeman 03/08/2020, 5:55 AM

## 2020-03-08 NOTE — Evaluation (Signed)
Occupational Therapy Re-Evaluation Patient Details Name: Bruce Webb MRN: [id number] DOB: 22-Dec-1941 Today's Date: 03/08/2020    History of Present Illness 79 y.o. gentleman prior history of essential hypertension, type 2 diabetes mellitus, recent COVID-1 illness initially diagnosed on 01/16/2020. Patient was discharged from hospital on recent discharge from the hospital on 01/31/2020 on 2L of nasal cannula oxygen and had been found to have pulmonary embolism. 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 He presented to the emergency department with exertional dyspnea, melena, and progressive vision loss involving the right eye. Chest x-ray of 2/22: asymmetric airspace disease, right greater than left. WBC increased on 2/24; afebrile.  Intubated 2/28 due to decline in medical status. TB has been ruled out.   Clinical Impression   Pt now with trach, overall decrease in function/stregth resulting in re-evaluation today. Son present throughout session (and called via video chat so wife? Could observe) RN present throughout session in addition to OT/PT for line management and assist. Pt was max A +2 for bed mobility to come to EOB, use of pad to assist with hips. Able to sit EOB with min to mod A (in ICU bed his feet don't quite touch the ground for support). Pt with overall decreased function of BUE - unable to bring hands to face for grooming required hand over hand. Pt total A +2 with use of pad to scoop/squat pivot to recliner with drop arm . LIft pad in place for RN staff. Pt was following commands ~50% of the time and with increased processing time. He did work on digit flexion/extension for edema management as well. Stratus IPad interpreter used throughout the session. OT will continue to follow acutely, at this time recommending LTACH post-acute to maximize safety and independence in ADL and functional transfers. Next session focus on BUE ROM and continued OOB activity.     Follow  Up Recommendations  LTACH;Supervision/Assistance - 24 hour    Equipment Recommendations  Other (comment)(defer to next venue of care)    Recommendations for Other Services       Precautions / Restrictions Precautions Precautions: Fall Precaution Comments: watch O2 Restrictions Weight Bearing Restrictions: No      Mobility Bed Mobility Overal bed mobility: Needs Assistance Bed Mobility: Supine to Sit     Supine to sit: Max assist;+2 for physical assistance;HOB elevated     General bed mobility comments: max A +2 to total for all aspects of bed mobility, use of pad to facilitate movement  Transfers Overall transfer level: Needs assistance Equipment used: 2 person hand held assist Transfers: Sit to/from [employer] Sit to Stand: Total assist;+2 physical assistance;+2 safety/equipment;From elevated surface   Squat pivot transfers: Total assist;+2 physical assistance;+2 safety/equipment;From elevated surface     General transfer comment: use of pad to scoop, facilitate weight shift and squat pivot to chair    Balance Overall balance assessment: Needs assistance Sitting-balance support: No upper extremity supported;Feet supported Sitting balance-Leahy Scale: Poor Sitting balance - Comments: Pt needed max assist initially progressing to min assist to sit EOB with pt leaning posteriorly and not using his hands to hold himself up. Sat EOB for up to 10 minutes.    Standing balance support: Bilateral upper extremity supported;During functional activity Standing balance-Leahy Scale: Zero                             ADL either performed or assessed with clinical judgement  ADL Overall ADL's : Needs assistance/impaired Eating/Feeding: NPO   Grooming: Wash/dry face;Sitting;Maximal assistance Grooming Details (indicate cue type and reason): EOB, hand over hand Upper Body Bathing: Maximal assistance   Lower Body Bathing: Maximal assistance    Upper Body Dressing : Maximal assistance   Lower Body Dressing: Total assistance   Toilet Transfer: Total assistance;+2 for physical assistance;+2 for safety/equipment;Stand-pivot Toilet Transfer Details (indicate cue type and reason): use of bed pad to assist Toileting- Clothing Manipulation and Hygiene: Total assistance       Functional mobility during ADLs: Total assistance;+2 for physical assistance;+2 for safety/equipment       Vision Baseline Vision/History: Glaucoma Vision Assessment?: Vision impaired- to be further tested in functional context Additional Comments: vision is better on right side than left     Perception     Praxis      Pertinent Vitals/Pain Pain Assessment: Faces Faces Pain Scale: Hurts a little bit Pain Location: generalized Pain Descriptors / Indicators: Grimacing Pain Intervention(s): Limited activity within patient's tolerance;Monitored during session;Repositioned     Hand Dominance Right   Extremity/Trunk Assessment Upper Extremity Assessment Upper Extremity Assessment: Generalized weakness;RUE deficits/detail;LUE deficits/detail RUE Deficits / Details: blig blister on R hand, it popped during session, RN aware and applied foam bandage. Overall strength 2 for BUE. RUE Coordination: decreased fine motor;decreased gross motor LUE Deficits / Details: overall strength 3+/5, decreased grasp LUE Coordination: decreased fine motor;decreased gross motor   Lower Extremity Assessment Lower Extremity Assessment: Defer to PT evaluation   Cervical / Trunk Assessment Cervical / Trunk Assessment: Normal   Communication Communication Communication: Prefers language other than Restaurant manager, fast food used in [place])   Cognition Arousal/Alertness: Awake/alert Behavior During Therapy: Flat affect Overall Cognitive Status: Difficult to assess                                 General Comments: Pt able to follow commands ~50% with delay,  son present and also ultilized Stratus Ipad Interpreter   General Comments  VSS with vent 40% FiO2, PEEP 5    Exercises General Exercises - Upper Extremity Digit Composite Flexion: AROM;Both;10 reps;Seated   Shoulder Instructions      Home Living Family/patient expects to be discharged to:: Private residence Living Arrangements: Spouse/significant other;Children Available Help at Discharge: Family Type of Home: House             Bathroom Shower/Tub: Engineer, civil (consulting) Toilet: Standard     Home Equipment: Medical laboratory scientific officer - single point   Additional Comments: home set up gleaned from notes from previous admission      Prior Functioning/Environment Level of Independence: Independent with assistive device(s)        Comments: pt stays in basement apartment and has a bathroom attached to the bedroom. He denies using RW and per chart review used SPC occasionally. Pt's daughter and pt reports he has not been walking for ~1.5-2 months but also state he had been getting to the attached bathroom but has not had a BM in almost a week. (info obtained from PT eval)        OT Problem List: Decreased strength;Decreased activity tolerance;Impaired balance (sitting and/or standing);Cardiopulmonary status limiting activity      OT Treatment/Interventions: Self-care/ADL training;Therapeutic exercise;Energy conservation;Therapeutic activities;Balance training;Patient/family education    OT Goals(Current goals can be found in the care plan section) Acute Rehab OT Goals Patient Stated Goal: none stated OT Goal Formulation: With patient Time  For Goal Achievement: 03/22/20 Potential to Achieve Goals: Fair ADL Goals Pt Will Perform Grooming: with min assist;sitting Pt Will Perform Upper Body Dressing: with mod assist;sitting Pt Will Perform Lower Body Dressing: with max assist;sitting/lateral leans Pt Will Transfer to Toilet: with mod assist;squat pivot transfer Pt Will Perform Toileting -  Clothing Manipulation and hygiene: with max assist;sit to/from stand  OT Frequency: Min 2X/week   Barriers to D/C:            Co-evaluation PT/OT/SLP Co-Evaluation/Treatment: Yes Reason for Co-Treatment: Complexity of the patient's impairments (multi-system involvement);Necessary to address cognition/behavior during functional activity;To address functional/ADL transfers;For patient/therapist safety PT goals addressed during session: Mobility/safety with mobility;Balance;Strengthening/ROM OT goals addressed during session: ADL's and self-care;Strengthening/ROM      AM-PAC OT "6 Clicks" Daily Activity     Outcome Measure Help from another person eating meals?: Total(NPO) Help from another person taking care of personal grooming?: A Lot Help from another person toileting, which includes using toliet, bedpan, or urinal?: Total Help from another person bathing (including washing, rinsing, drying)?: A Lot Help from another person to put on and taking off regular upper body clothing?: Total Help from another person to put on and taking off regular lower body clothing?: Total 6 Click Score: 8   End of Session Equipment Utilized During Treatment: Oxygen Nurse Communication: Mobility status;Need for lift equipment  Activity Tolerance: Patient tolerated treatment well Patient left: in chair;with call bell/phone within reach;with nursing/sitter in room;with family/visitor present;Other (comment)(lift pad underneath patient for RN staff)  OT Visit Diagnosis: Unsteadiness on feet (R26.81);Muscle weakness (generalized) (M62.81)                Time: 1002-1030 OT Time Calculation (min): 28 min Charges:  OT General Charges $OT Visit: 1 Visit OT Evaluation $OT Re-eval: 1 Re-eval  Nyoka Cowden OTR/L Acute Rehabilitation Services Pager: [phone] Office: [phone]  Evern Bio Angelin Cutrone 03/08/2020, 1:12 PM

## 2020-03-09 ENCOUNTER — Inpatient Hospital Stay (HOSPITAL_COMMUNITY): Payer: Medicare Other

## 2020-03-09 LAB — BASIC METABOLIC PANEL
Anion gap: 9 (ref 5–15)
BUN: 73 mg/dL — ABNORMAL HIGH (ref 8–23)
CO2: 26 mmol/L (ref 22–32)
Calcium: 7.3 mg/dL — ABNORMAL LOW (ref 8.9–10.3)
Chloride: 116 mmol/L — ABNORMAL HIGH (ref 98–111)
Creatinine, Ser: 1.15 mg/dL (ref 0.61–1.24)
GFR calc Af Amer: >60 mL/min (ref >60)
GFR calc non Af Amer: >60 mL/min (ref >60)
Glucose, Bld: 142 mg/dL — ABNORMAL HIGH (ref 70–99)
Potassium: 3.7 mmol/L (ref 3.5–5.1)
Sodium: 151 mmol/L — ABNORMAL HIGH (ref 135–145)

## 2020-03-09 LAB — GLUCOSE, CAPILLARY
Glucose-Capillary: 104 mg/dL — ABNORMAL HIGH (ref 70–99)
Glucose-Capillary: 112 mg/dL — ABNORMAL HIGH (ref 70–99)
Glucose-Capillary: 116 mg/dL — ABNORMAL HIGH (ref 70–99)
Glucose-Capillary: 140 mg/dL — ABNORMAL HIGH (ref 70–99)
Glucose-Capillary: 141 mg/dL — ABNORMAL HIGH (ref 70–99)
Glucose-Capillary: 223 mg/dL — ABNORMAL HIGH (ref 70–99)
Glucose-Capillary: 53 mg/dL — ABNORMAL LOW (ref 70–99)
Glucose-Capillary: 66 mg/dL — ABNORMAL LOW (ref 70–99)

## 2020-03-09 LAB — CBC
HCT: 26.2 % — ABNORMAL LOW (ref 39.0–52.0)
Hemoglobin: 7.9 g/dL — ABNORMAL LOW (ref 13.0–17.0)
MCH: 29.8 pg (ref 26.0–34.0)
MCHC: 30.2 g/dL (ref 30.0–36.0)
MCV: 98.9 fL (ref 80.0–100.0)
Platelets: 129 10*3/uL — ABNORMAL LOW (ref 150–400)
RBC: 2.65 MIL/uL — ABNORMAL LOW (ref 4.22–5.81)
RDW: 22.2 % — ABNORMAL HIGH (ref 11.5–15.5)
WBC: 19.4 10*3/uL — ABNORMAL HIGH (ref 4.0–10.5)
nRBC: 0.2 % (ref 0.0–0.2)

## 2020-03-09 MED ORDER — ORAL CARE MOUTH RINSE
15.0000 mL | Freq: Four times a day (QID) | OROMUCOSAL | Status: DC
  Administered 2020-03-10 – 2020-03-11 (×8): 15 mL via OROMUCOSAL

## 2020-03-09 MED ORDER — DEXTROSE 50 % IV SOLN
INTRAVENOUS | Status: AC
  Administered 2020-03-09: 25 mL
  Filled 2020-03-09: qty 50

## 2020-03-09 MED ORDER — POTASSIUM CHLORIDE 20 MEQ/15ML (10%) PO SOLN
20.0000 meq | ORAL | Status: AC
  Administered 2020-03-09 (×2): 20 meq
  Filled 2020-03-09 (×2): qty 15

## 2020-03-09 MED ORDER — MIDODRINE HCL 5 MG PO TABS
10.0000 mg | ORAL_TABLET | Freq: Three times a day (TID) | ORAL | Status: DC
  Administered 2020-03-09 – 2020-03-15 (×16): 10 mg
  Filled 2020-03-09 (×16): qty 2

## 2020-03-09 MED ORDER — POTASSIUM CHLORIDE CRYS ER 20 MEQ PO TBCR: 20.0000 meq | EXTENDED_RELEASE_TABLET | ORAL | Status: DC

## 2020-03-09 NOTE — Progress Notes (Signed)
Hypoglycemic Event  CBG: 66  Treatment: D50 25 mL (12.5 gm)  Symptoms: None  Follow-up CBG: Time:2104 CBG Result:223  Possible Reasons for Event: Unknown    Caswell Corwin 03/09/20 10:00 PM

## 2020-03-09 NOTE — Progress Notes (Signed)
NAME:  Bruce Webb, MRN:  [id number], DOB:  [birth year], LOS: 23 ADMISSION DATE:  02/15/2020, CONSULTATION DATE:  02/19/2020 REFERRING MD:  Dr. Karleen Hampshire, CHIEF COMPLAINT:  Hypotension/ hypoxia    Brief History   79 year old male post COVID and PE in January on Eliquis admitted 2/21 to [facility] for extertional dyspnea found to be anemic with hemoccult positive stools.  GI consulted and transfused PRBC with since stabilized Hgb but has had persistent higher O2 requirements.  Was diuresed but  developed hypotension, with ongoing higher O2 requirements, PCCM was asked to consult.      Past Medical History  HTN, HFpEF, DMT2, glaucoma with progressive vision loss (left, now rapid loss in right) - recent COVID 01/16/2020 with subsequent PE on Eliquis and O2 2L [place]  Significant Hospital Events   2/21 admit. Stool ob =negatoive 2/23- echo ef 65% 2/25 PCCM consulted for hypotension and persistent hypoxemia 2/26 - Has severe desaturations with activity and eating. Required 10L while eating his meal today. No acute distress but continues to have shortness of breath. Denies chest pain.  2/27 -  -remains on 10 L.  He easily desaturates.  [name] at the bedside.  I spoke to her and subsequently also to their family friend who is in physician assistant in cardiology [place]).  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  According to the daughter he got worse with COVID-19 then got better went home but he declined again in terms of his hypoxemia.  2/28 moved to ICU, intubated. Bronch completed at bedside. Infectious disease consulted for cavitary PNA. Bactrim started for possible PCP 3/3 started on Voriconazole for + aspergillus ag 3/4 not weaning due to agitation 3/9 summary: treating for complicated cavitary PNA felt 2/2 e faecalis and aspergillus ag +  3/12 ID sign off note: stopping Voreconizole. Recommending 10-14 more days augmentin w/ f/u CT chest 2-3 weeks also  recommending MRI of brain at some point to r/o septic embolic disease 4/33 trach  3/16 getting CT abd to eval for  Consults:  GI  PCCM  Procedures:   Significant Diagnostic Tests:  2/21 [facility] >> 1. No acute intracranial abnormalities. 2. Mild cerebral atrophy with chronic microvascular ischemic changes in the cerebral white matter, as above.  CTA 01/27/20 - Diffuse subpleural/peripheral interstitial ground glass opacities with increased involvement in the the lower lobes bilaterally R>L. CXR 02/16/20 R>L multifocal pneumonia R>L, no edema or effusion  2/23 [facility] >> 1. Left ventricular ejection fraction, by estimation, is 60 to 65%. The left ventricle has normal function. The left ventricle has no regional wall motion abnormalities. Left ventricular diastolic parameters are consistent with Grade II diastolic dysfunction (pseudonormalization).  2. The history indicates the patient has a pulmonary embolus. The RV is  not dilated and there is normal RV function. . Right ventricular systolic function is low normal. The right ventricular size is normal.  3. The mitral valve is normal in structure and function. Mild mitral valve regurgitation. No evidence of mitral stenosis.  4. The aortic valve is normal in structure and function. Aortic valve regurgitation is not visualized. No aortic stenosis is present.   Micro Data:  2/21 BCx 2 >> ngtd x 4 days 2/25 - CCP 2/27 - quant gold -  2/27 RVP - negative 2/28  - urine strep - negative 228 - urine leg -  2/28 - aspergillus Ab -  xxxxxxxxxxxx 3/1 BAL Cx, fungal, acid  fast >> negative 3/1 BAL PCP>> negative 3/ blood cultures - ngtd 3/4 enterococcus Faecalis; few candida lusitaniana  Antimicrobials:  2/21 azithro>>2/26 2/21 ceftriaxone ->off 2/22 cefepime >>off 2/28 Bactrim >3/4 3/1 Flagyl>85f 3/2 Voriconazole >>3/12 3/4 Zyvox->off 3/12: augmentin (this was after 8 days of unasyn) Interim history/subjective:  Getting up to chair    Objective   Blood pressure 113/61, pulse 73, temperature 98.2 F (36.8 C), temperature source Axillary, resp. rate (Abnormal) 25, height _0  (1.575 m), weight 67.6 kg, SpO2 96 %.    Vent Mode: PCV FiO2 (%):  [40 %] 40 % Set Rate:  [28 bmp] 28 bmp PEEP:  [5 cmH20] 5 cmH20 Plateau Pressure:  [28 cmH20-40 cmH20] 28 cmH20   Intake/Output Summary (Last 24 hours) at 03/09/2020 0919 Last data filed at 03/09/2020 0700 Gross per 24 hour  Intake 2310.83 ml  Output 900 ml  Net 1410.83 ml   Filed Weights   03/07/20 0500 03/08/20 0400 03/09/20 0500  Weight: 65.5 kg 65.5 kg 67.6 kg   Physical Exam: General: This is a chronically acutely ill 79year old [place] currently resting on full ventilatory support HEENT normocephalic atraumatic size 6 cuffed tracheostomy is unremarkable Pulmonary: Equal bilateral i no accessory use diminished bases Cardiac: Regular rate and rhythm without murmur rub or gallop Abdomen: Soft nontender no organomegaly Extremities: Warm dry brisk cap refill Neuro: Awake, non-English-speaking, follows commands, no focal deficits from a motor standpoint GU: Clear yellow   Resolved Hospital Problem list   Transaminitis -resolved.worsening likely in setting of shock. RUQ unrevealing Acute blood loss anemia AKI with hyperkalemia Acute urinary retention   Assessment & Plan:  79year old male never smoker with prior COVID-19 infection diagnosed in January and requiring hospitalization from 2/1-2/6, recently diagnosed pulmonary emboli on anticoagulation and end-stage glaucoma with right vision loss in addition to his known left vision loss who presents with worsening shortness of breath and admitted for acute on chronic respiratory failure secondary to multifocal pneumonia. Has had required high O2 requirements during the course of this hospitalization despite steroid therapy and transferred to the ICU on 2/28.  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 Likely 6 mo given limited mobility   Hypernatremia, hyperchloremia  -improving Plan Cont current free water Hold diuretics   Acute metabolic encephalopathy w/ agitated delirium  Plan Supportive care  Vision loss Plan Consider MRI at some point    Type 2 diabetes mellitus Plan ssi  Anemia and thrombocytopenia  hgb trending down. No acute blood loss noted. Suspect more critical illness related  Plan Trend cbc  Daily Goals Checklist  Pain/Anxiety/Delirium protocol (if indicated): Precedex VAP protocol (if indicated): bundle in place. Respiratory support goals: Daily SBT as tolerated.  Plan is for tracheostomy Saturday DVT prophylaxis: IV heparin Nutrition: tube feeds, at goal GI prophylaxis: pantoprazole. Urinary catheter: Condom. Central line: PICC Glucose control: SSI - euglycemic  Mobility: PT/OT consulted, advance as tolerated Code Status: full Family Communication:Discussed plan of care in GMaliwith patient's son RRon [employer] bedside. Disposition: ICU.-->needs LTAC eventually  My cct 32 minutes  Erick Colace ACNP-BC [place] Pager # [phone] OR # [phone] if no answer

## 2020-03-09 NOTE — Progress Notes (Signed)
eLink Physician-Brief Progress Note Patient Name: [name] DOB: 19-Oct-1941 MRN: [id number]   Date of Service  03/09/2020  HPI/Events of Note  K+ 3.7, Creatinine 1.15  eICU Interventions  KCL 20 meq via NG tube Q 4 hours x 2 doses.     Intervention Category Intermediate Interventions: Electrolyte abnormality - evaluation and management  Migdalia Dk 03/09/2020, 5:48 AM

## 2020-03-09 NOTE — Progress Notes (Signed)
Patient ID: Bruce Webb, male   DOB: [birth year], 79 y.o.   MRN: [id number]  This NP discussed with bedside RN and Dr Denese Killings my various conversations with family.   Critical care has been working closely with family and decision is clearly, family is  open to all offered and available medical interventions to prolong life.  Patient's transition of care is likely LTAC.  I discussed with Dr. Denese Killings that the palliative medicine team will sign off at this time and to please reconsult in the future if we can be of any assistance.  No charge  Lorinda Creed NP  Palliative Medicine Team Team Phone # [phone] Pager [phone]

## 2020-03-09 NOTE — Progress Notes (Signed)
Patient transported to CT & back on vent with no problems. 

## 2020-03-09 NOTE — TOC Progression Note (Addendum)
Transition of Care [facility]) - Progression Note    Patient Details  Name: Bruce Webb MRN: [id number] Date of Birth: 25-Aug-1941  [facility]) CM/SW Contact  Cherylann Parr, RN Phone Number: 03/09/2020, 10:49 AM  Clinical Narrative:   CM received verbal consult for LTACH.  CM gave referral to both [facility] - both will offer a bed if available once pt becomes stable for discharge.  CM left VM with pt's son.  CM attempted to call pt's daughter however reached pt's grandson.  Per grandson - both daughter and son are out of the home now - neither speak english well.  Grandson to call CM back once other family members arrive home for discharge planning [facility]) discussion.    Update 1700:  CM was able to speak with pts son via interpretor services.  CM informed son of recommendation for discharge to [facility].  CM provided choice of [facility] and [facility].  Pt's son wants to discuss option with his sister before making a decision.  TOC will follow up with family     Expected Discharge Plan: (TBD) Barriers to Discharge: Continued Medical Work up  Expected Discharge Plan and Services Expected Discharge Plan: (TBD)                                               Social Determinants of Health (SDOH) Interventions    Readmission Risk Interventions No flowsheet data found.

## 2020-03-10 LAB — GLUCOSE, CAPILLARY
Glucose-Capillary: 115 mg/dL — ABNORMAL HIGH (ref 70–99)
Glucose-Capillary: 131 mg/dL — ABNORMAL HIGH (ref 70–99)
Glucose-Capillary: 132 mg/dL — ABNORMAL HIGH (ref 70–99)
Glucose-Capillary: 135 mg/dL — ABNORMAL HIGH (ref 70–99)
Glucose-Capillary: 171 mg/dL — ABNORMAL HIGH (ref 70–99)
Glucose-Capillary: 184 mg/dL — ABNORMAL HIGH (ref 70–99)
Glucose-Capillary: 94 mg/dL (ref 70–99)

## 2020-03-10 LAB — BASIC METABOLIC PANEL
Anion gap: 10 (ref 5–15)
BUN: 73 mg/dL — ABNORMAL HIGH (ref 8–23)
CO2: 25 mmol/L (ref 22–32)
Calcium: 7.3 mg/dL — ABNORMAL LOW (ref 8.9–10.3)
Chloride: 115 mmol/L — ABNORMAL HIGH (ref 98–111)
Creatinine, Ser: 1.14 mg/dL (ref 0.61–1.24)
GFR calc Af Amer: >60 mL/min (ref >60)
GFR calc non Af Amer: >60 mL/min (ref >60)
Glucose, Bld: 164 mg/dL — ABNORMAL HIGH (ref 70–99)
Potassium: 4.3 mmol/L (ref 3.5–5.1)
Sodium: 150 mmol/L — ABNORMAL HIGH (ref 135–145)

## 2020-03-10 LAB — CBC
HCT: 25.7 % — ABNORMAL LOW (ref 39.0–52.0)
Hemoglobin: 7.6 g/dL — ABNORMAL LOW (ref 13.0–17.0)
MCH: 30 pg (ref 26.0–34.0)
MCHC: 29.6 g/dL — ABNORMAL LOW (ref 30.0–36.0)
MCV: 101.6 fL — ABNORMAL HIGH (ref 80.0–100.0)
Platelets: 129 10*3/uL — ABNORMAL LOW (ref 150–400)
RBC: 2.53 MIL/uL — ABNORMAL LOW (ref 4.22–5.81)
RDW: 22 % — ABNORMAL HIGH (ref 11.5–15.5)
WBC: 21.4 10*3/uL — ABNORMAL HIGH (ref 4.0–10.5)
nRBC: 0 % (ref 0.0–0.2)

## 2020-03-10 MED ORDER — ALPRAZOLAM 0.25 MG PO TABS
0.2500 mg | ORAL_TABLET | Freq: Two times a day (BID) | ORAL | Status: DC
  Administered 2020-03-10 – 2020-03-13 (×7): 0.25 mg
  Filled 2020-03-10 (×7): qty 1

## 2020-03-10 MED ORDER — ATROPINE SULFATE 1 MG/10ML IJ SOSY
PREFILLED_SYRINGE | INTRAMUSCULAR | Status: AC
  Filled 2020-03-10: qty 10

## 2020-03-10 MED ORDER — DIPHENOXYLATE-ATROPINE 2.5-0.025 MG/5ML PO LIQD
10.0000 mL | Freq: Four times a day (QID) | ORAL | Status: AC
  Administered 2020-03-10 (×4): 10 mL via ORAL
  Filled 2020-03-10 (×4): qty 10

## 2020-03-10 MED ORDER — FREE WATER
350.0000 mL | Status: DC
  Administered 2020-03-10: 350 mL
  Administered 2020-03-10: 13:00:00 200 mL
  Administered 2020-03-10 – 2020-03-11 (×8): 350 mL

## 2020-03-10 MED ORDER — FLORANEX PO PACK
1.0000 g | PACK | Freq: Three times a day (TID) | ORAL | Status: DC
  Administered 2020-03-10 – 2020-03-15 (×14): 1 g
  Filled 2020-03-10 (×18): qty 1

## 2020-03-10 MED ORDER — INSULIN GLARGINE 100 UNIT/ML ~~LOC~~ SOLN
15.0000 [IU] | Freq: Every day | SUBCUTANEOUS | Status: DC
  Administered 2020-03-11: 15 [IU] via SUBCUTANEOUS
  Filled 2020-03-10 (×2): qty 0.15

## 2020-03-10 MED ORDER — NOREPINEPHRINE 4 MG/250ML-% IV SOLN
0.0000 ug/min | INTRAVENOUS | Status: DC
  Administered 2020-03-10: 23:00:00 2 ug/min via INTRAVENOUS
  Administered 2020-03-12: 5 ug/min via INTRAVENOUS
  Filled 2020-03-10 (×2): qty 250

## 2020-03-10 MED ORDER — BETHANECHOL CHLORIDE 10 MG PO TABS
10.0000 mg | ORAL_TABLET | Freq: Three times a day (TID) | ORAL | Status: DC
  Administered 2020-03-10 (×3): 10 mg
  Filled 2020-03-10 (×4): qty 1

## 2020-03-10 NOTE — Progress Notes (Signed)
Attempted trach collar 10L/60% while pt is sitting up in chair. Pt instantly went bradycardia in the mid 30s during trial. Placed pt back on full support on ventilator. RN and speech therapy at bedside to witness. Pt is stable at this time.

## 2020-03-10 NOTE — Progress Notes (Signed)
SLP Cancellation Note  Patient Details Name: Woodruff Skirvin MRN: [id number] DOB: [birth year]   Cancelled treatment:        Planned for PMV eval. RN, RT, Puja to interpret (RN) and son present as RT switched pt to trach collar. His HR dropped to 30's and sats in 70's (RN verbalized similar situation yesterday). RT immediately placed him back on vent. RT/ST agreed pt not appropriate to even try inline valve at this time. Will continue efforts. Brief education provided to son re: plan.   Royce Macadamia 03/10/2020, 12:16 PM   Breck Coons Lonell Face.Ed Nurse, children's [phone] Office [phone]

## 2020-03-10 NOTE — TOC Progression Note (Signed)
Transition of Care [facility]) - Progression Note    Patient Details  Name: Bruce Webb MRN: [id number] Date of Birth: 07-Jun-1941  [facility]) CM/SW Contact  Cherylann Parr, RN Phone Number: 03/10/2020, 3:21 PM  Clinical Narrative:   CM was unable to to reach pts son - VM full.  CM spoke to pts grandson.  Grandson informed CM that his father discussed LTACH and options with him last night.  Family would like to discuss it more in depth before agreeing to [facility].  Family request that CM follow up on Friday.      Expected Discharge Plan: (TBD) Barriers to Discharge: Continued Medical Work up  Expected Discharge Plan and Services Expected Discharge Plan: (TBD)                                               Social Determinants of Health (SDOH) Interventions    Readmission Risk Interventions No flowsheet data found.

## 2020-03-10 NOTE — Progress Notes (Addendum)
NAME:  Bruce Webb, MRN:  [id number], DOB:  June 04, 1941, LOS: 24 ADMISSION DATE:  02/15/2020, CONSULTATION DATE:  02/19/2020 REFERRING MD:  Dr. Karleen Hampshire, CHIEF COMPLAINT:  Hypotension/ hypoxia    Brief History   79 year old male post COVID and PE in January on Eliquis admitted 2/21 to [facility] for extertional dyspnea found to be anemic with hemoccult positive stools.  GI consulted and transfused PRBC with since stabilized Hgb but has had persistent higher O2 requirements.  Was diuresed but  developed hypotension, with ongoing higher O2 requirements, PCCM was asked to consult.      Past Medical History  HTN, HFpEF, DMT2, glaucoma with progressive vision loss (left, now rapid loss in right) - recent COVID 01/16/2020 with subsequent PE on Eliquis and O2 2L [place]  Significant Hospital Events   2/21 admit. Stool ob =negatoive 2/23- echo ef 65% 2/25 PCCM consulted for hypotension and persistent hypoxemia 2/26 - Has severe desaturations with activity and eating. Required 10L while eating his meal today. No acute distress but continues to have shortness of breath. Denies chest pain.  2/27 -  -remains on 10 L.  He easily desaturates.  [name] at the bedside.  I spoke to her and subsequently also to their family friend who is in physician assistant in cardiology [place]).  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  According to the daughter he got worse with COVID-19 then got better went home but he declined again in terms of his hypoxemia.  2/28 moved to ICU, intubated. Bronch completed at bedside. Infectious disease consulted for cavitary PNA. Bactrim started for possible PCP 3/3 started on Voriconazole for + aspergillus ag 3/4 not weaning due to agitation 3/9 summary: treating for complicated cavitary PNA felt 2/2 e faecalis and aspergillus ag +  3/12 ID sign off note: stopping Voreconizole. Recommending 10-14 more days augmentin w/ f/u CT chest 2-3 weeks also  recommending MRI of brain at some point to r/o septic embolic disease 1/91 trach  3/16 getting CT abd to eval for PEG 3/17 getting OOB 1x lasix dose. Adding anxiolytic (got freq PRN sed last evening). Adding probiotic and one day of lomotil to see if we can slow down diarrhea. Having sig water loss still. Added urecholine for urinary retention.  Consults:  GI  PCCM  Procedures:   Significant Diagnostic Tests:  2/21 CTH >> 1. No acute intracranial abnormalities. 2. Mild cerebral atrophy with chronic microvascular ischemic changes in the cerebral white matter, as above.  CTA 01/27/20 - Diffuse subpleural/peripheral interstitial ground glass opacities with increased involvement in the the lower lobes bilaterally R>L. CXR 02/16/20 R>L multifocal pneumonia R>L, no edema or effusion  2/23 TTE >> 1. Left ventricular ejection fraction, by estimation, is 60 to 65%. The left ventricle has normal function. The left ventricle has no regional wall motion abnormalities. Left ventricular diastolic parameters are consistent with Grade II diastolic dysfunction (pseudonormalization).  2. The history indicates the patient has a pulmonary embolus. The RV is  not dilated and there is normal RV function. . Right ventricular systolic function is low normal. The right ventricular size is normal.  3. The mitral valve is normal in structure and function. Mild mitral valve regurgitation. No evidence of mitral stenosis.  4. The aortic valve is normal in structure and function. Aortic valve regurgitation is not visualized. No aortic stenosis is present.  CT abd 3/17 (for PEG):1. Marked distention of the bladder  with secondary slight dilatation of the renal collecting systems bilaterally. 2. Diffuse anasarca.  No ascites. 3. Small bilateral pleural effusions, left greater than right. 4. Extensive bilateral pulmonary infiltrates, unchanged. Micro Data:  2/21 BCx 2 >> ngtd x 4 days 2/25 - CCP 2/27 - quant gold -    2/27 RVP - negative 2/28  - urine strep - negative 228 - urine leg -  2/28 - aspergillus Ab -  xxxxxxxxxxxx 3/1 BAL Cx, fungal, acid fast >> negative 3/1 BAL PCP>> negative 3/ blood cultures - ngtd 3/4 enterococcus Faecalis; few candida lusitaniana  Antimicrobials:  2/21 azithro>>2/26 2/21 ceftriaxone ->off 2/22 cefepime >>off 2/28 Bactrim >3/4 3/1 Flagyl>84f 3/2 Voriconazole >>3/12 3/4 Zyvox->off 3/12: augmentin (this was after 8 days of unasyn) Interim history/subjective:  Anxious overnight.   Objective   Blood pressure 112/69, pulse 82, temperature 99 F (37.2 C), resp. rate (Abnormal) 25, height _0  (1.575 m), weight 67.6 kg, SpO2 100 %.    Vent Mode: PCV FiO2 (%):  [40 %-60 %] 50 % Set Rate:  [18 bmp-28 bmp] 28 bmp PEEP:  [5 cmH20] 5 cmH20 Plateau Pressure:  [28 cmH20-39 cmH20] 34 cmH20   Intake/Output Summary (Last 24 hours) at 03/10/2020 0944 Last data filed at 03/10/2020 0900 Gross per 24 hour  Intake 2485 ml  Output 650 ml  Net 1835 ml   Filed Weights   03/07/20 0500 03/08/20 0400 03/09/20 0500  Weight: 65.5 kg 65.5 kg 67.6 kg   Physical Exam: General this is a 79year old [place] resting in bed HENT NCAT no JVD MMM trach not remarkable pulm decreased bases. No accessory use but remains tachypnic  Card RRR  abd not tender-->+ liquid stool  Ext diffuse pitting edema/anasarca Neuro hard of hearing + visual loss. Moves all ext.  GU cl yellow    Resolved Hospital Problem list   Transaminitis -resolved.worsening likely in setting of shock. RUQ unrevealing Acute blood loss anemia AKI with hyperkalemia Acute urinary retention   Assessment & Plan:  79year old male never smoker with prior COVID-19 infection diagnosed in January and requiring hospitalization from 2/1-2/6, recently diagnosed pulmonary emboli on anticoagulation and end-stage glaucoma with right vision loss in addition to his known left vision loss who presents with worsening shortness  of breath and admitted for acute on chronic respiratory failure secondary to multifocal pneumonia. Has had required high O2 requirements during the course of this hospitalization despite steroid therapy and transferred to the ICU on 2/28.  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  Plan Start urecholine 167mtid VT Bladder scan q 8 I&O cath for vol > 30055mRN   Diarrhea Plan Add lactobacillus VT adding lomotil x 4 doses.   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 No acute blood loss noted. Suspect more critical illness related  Plan Trend cbc  Daily Goals Checklist  Pain/Anxiety/Delirium protocol (if indicated): Precedex VAP protocol (if indicated): bundle in place. Respiratory support goals: Daily SBT as tolerated.  Plan is for tracheostomy Saturday DVT prophylaxis: IV heparin Nutrition: tube feeds, at goal GI prophylaxis: pantoprazole. Urinary catheter:  Condom. Central line: PICC Glucose control: SSI - euglycemic  Mobility: PT/OT consulted, advance as tolerated Code Status: full Family Communication:Discussed plan of care in [place] with patient's son Ron Agee at bedside. Disposition: ICU.-->needs LTAC eventually   Erick Colace ACNP-BC [place] Pager # [phone] OR # [phone] if no answer

## 2020-03-10 NOTE — Progress Notes (Signed)
eLink Physician-Brief Progress Note Patient Name: Bruce Webb DOB: April 20, 1941 MRN: [id number]   Date of Service  03/10/2020  HPI/Events of Note  Marginal blood pressures and a CVP of 19, ECHO from 2/21 shows normal LV function with EF 65 %. Pt is very edematous.  eICU Interventions  Low dose Norepinephrine infusion ordered, the hope is that an increase in his MAP will increase renal perfusion pressure and promote diuresis, Phenylephrine was not chosen due to history of recent marked bradycardic spells with heart rate as low as the 30's. Current heart rate is 62.        Thomasene Lot Zahmir Lalla 03/10/2020, 10:58 PM

## 2020-03-10 NOTE — Significant Event (Signed)
Rapid Response Event Note  I was asked by ICU RN, RT, and SLP for assistance in translating for them, per staff, they were trying to transition the patient from the ventilator to trach collar and if that goes well, then SLP would proceed with PMV trials. I explained this process to the patient's son who was in the room and then proceeded to explain this to the patient as well to the best of my ability.   Patient was taken off the vent by RT, TC was applied but very quickly the patient became quite bradycardic and his saturations dropped into the 70s as well. RT placed the patient back on the ventilator immediately and HR/SpO2 recovered nicely.   Patient's son, the staff, and I talked about how this process takes times and will require effort/strength from the patient. Patient's son was appreciative of all of the great care that this father was receiving at [facility], he greatly appreciates the staff's hard work.    NO RRT INTERVENTIONS - staff called me because the translator tablet was not functioning properly. Afterwards staff and I were able to get to operate again.   [place], Emmanuella Mirante R

## 2020-03-10 NOTE — Progress Notes (Addendum)
Physical Therapy Treatment Patient Details Name: Bruce Webb MRN: [id number] DOB: 1941-10-31 Today's Date: 03/10/2020    History of Present Illness 79 y.o. gentleman prior history of essential hypertension, type 2 diabetes mellitus, recent COVID-19 illness initially diagnosed on 01/16/2020. Patient was discharged from hospital on recent discharge from the hospital on 01/31/2020 on 2L of nasal cannula oxygen and had been found to have pulmonary embolism. 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 He presented to the emergency department with exertional dyspnea, melena, and progressive vision loss involving the right eye. Chest x-ray of 2/22: asymmetric airspace disease, right greater than left. WBC increased on 2/24; afebrile.  Intubated 2/28 due to decline in medical status. TB has been ruled out.    PT Comments    Pt admitted with above diagnosis. Pt was able to sit EOB x 10 minutes with max progressing to min guard assit for brief periods of time. Pt Needed total assist +2 to scoot to recliner with use of pad as he was unable to pivot without assist. Figured out today that pt responds best if PT/staff get in his central vision and he responded to PT doing a thumbs up and then he did a thumbs up in response. When PT on the left or right of pt, no response.  Pt met 0/4 goals therefore revised goals today.  Pt currently with functional limitations due to the balance and endurance deficits. Pt will benefit from skilled PT to increase their independence and safety with mobility to allow discharge to the venue listed below.     Follow Up Recommendations  LTACH;Supervision/Assistance - 24 hour     Equipment Recommendations  Other (comment)(TBD)    Recommendations for Other Services       Precautions / Restrictions Precautions Precautions: Fall Precaution Comments: watch O2 Restrictions Weight Bearing Restrictions: No    Mobility  Bed Mobility Overal bed mobility: Needs  Assistance Bed Mobility: Supine to Sit     Supine to sit: +2 for physical assistance;HOB elevated;Total assist     General bed mobility comments: tot A +2 to total for all aspects of bed mobility today as pt was sleepy on arrival, use of pad to facilitate movement  Transfers Overall transfer level: Needs assistance Equipment used: 2 person hand held assist Transfers: Sit to/from [employer] Sit to Stand: Total assist;+2 physical assistance;+2 safety/equipment;From elevated surface   Squat pivot transfers: Total assist;+2 physical assistance;+2 safety/equipment;From elevated surface     General transfer comment: use of pad to scoop, facilitate weight shift and squat pivot to chair  Ambulation/Gait                 Stairs             Wheelchair Mobility    Modified Rankin (Stroke Patients Only)       Balance Overall balance assessment: Needs assistance Sitting-balance support: No upper extremity supported;Feet supported Sitting balance-Leahy Scale: Poor Sitting balance - Comments: Pt needed max assist initially progressing to min assist to sit EOB with pt leaning posteriorly at times however was demonstrating that he could lean forward and use hands to hold himself up. Sat EOB for up to 10 minutes.    Standing balance support: Bilateral upper extremity supported;During functional activity Standing balance-Leahy Scale: Zero                              Cognition Arousal/Alertness: Awake/alert Behavior  During Therapy: Flat affect Overall Cognitive Status: Difficult to assess                                 General Comments: Pt able to follow commands ~50% with delay, son present and also ultilized Landscape architect Exercises - Lower Extremity Long Arc Quad: Both;5 reps;Seated;PROM;AAROM Other Exercises Other Exercises: heel cord stretch bil 30 sec    General Comments General  comments (skin integrity, edema, etc.): VSS with vent 50% FiO2 and PEEP 5.        Pertinent Vitals/Pain Pain Assessment: Faces Faces Pain Scale: Hurts a little bit Pain Location: generalized Pain Descriptors / Indicators: Grimacing Pain Intervention(s): Limited activity within patient's tolerance;Monitored during session;Repositioned    Home Living                      Prior Function            PT Goals (current goals can now be found in the care plan section) Acute Rehab PT Goals Patient Stated Goal: none stated PT Goal Formulation: Patient unable to participate in goal setting Time For Goal Achievement: 03/26/20 Potential to Achieve Goals: Fair Progress towards PT goals: Progressing toward goals    Frequency    Min 3X/week      PT Plan Current plan remains appropriate;Frequency needs to be updated    Co-evaluation              AM-PAC PT "6 Clicks" Mobility   Outcome Measure  Help needed turning from your back to your side while in a flat bed without using bedrails?: Total Help needed moving from lying on your back to sitting on the side of a flat bed without using bedrails?: Total Help needed moving to and from a bed to a chair (including a wheelchair)?: Total Help needed standing up from a chair using your arms (e.g., wheelchair or bedside chair)?: Total Help needed to walk in hospital room?: Total Help needed climbing 3-5 steps with a railing? : Total 6 Click Score: 6    End of Session Equipment Utilized During Treatment: Other (comment);Gait belt(trach on vent) Activity Tolerance: Patient limited by fatigue Patient left: with call bell/phone within reach;in chair;with nursing/sitter in room Nurse Communication: Mobility status;Need for lift equipment PT Visit Diagnosis: Unsteadiness on feet (R26.81);Other abnormalities of gait and mobility (R26.89);Muscle weakness (generalized) (M62.81)     Time: 2119-4174 PT Time Calculation (min) (ACUTE  ONLY): 28 min  Charges:  $Therapeutic Activity: 23-37 mins                     Corgan Mormile W,PT Acute Rehabilitation Services Pager:  [phone]  Office:  [place] 03/10/2020, 11:44 AM

## 2020-03-10 NOTE — Progress Notes (Addendum)
RN called Pola Corn a change in regarding pts status. Pt has had multiple episodes of bradycardia into the upper 30s, low 40s. Pt also now with hypotension (MAP-59). CVP - 20. Notified [facility] RN of pt change. Will await orders and continue to monitor closely.  Caswell Corwin, RN 03/10/20 10:20 PM

## 2020-03-11 ENCOUNTER — Inpatient Hospital Stay (HOSPITAL_COMMUNITY): Payer: Medicare Other

## 2020-03-11 ENCOUNTER — Encounter (HOSPITAL_COMMUNITY): Payer: Self-pay | Admitting: Family Medicine

## 2020-03-11 LAB — CBC WITH DIFFERENTIAL/PLATELET
Abs Immature Granulocytes: 0.14 10*3/uL — ABNORMAL HIGH (ref 0.00–0.07)
Basophils Absolute: 0 10*3/uL (ref 0.0–0.1)
Basophils Relative: 0 %
Eosinophils Absolute: 0.3 10*3/uL (ref 0.0–0.5)
Eosinophils Relative: 2 %
HCT: 23 % — ABNORMAL LOW (ref 39.0–52.0)
Hemoglobin: 6.8 g/dL — CL (ref 13.0–17.0)
Immature Granulocytes: 1 %
Lymphocytes Relative: 7 %
Lymphs Abs: 1.1 10*3/uL (ref 0.7–4.0)
MCH: 30.8 pg (ref 26.0–34.0)
MCHC: 29.6 g/dL — ABNORMAL LOW (ref 30.0–36.0)
MCV: 104.1 fL — ABNORMAL HIGH (ref 80.0–100.0)
Monocytes Absolute: 0.9 10*3/uL (ref 0.1–1.0)
Monocytes Relative: 5 %
Neutro Abs: 14.6 10*3/uL — ABNORMAL HIGH (ref 1.7–7.7)
Neutrophils Relative %: 85 %
Platelets: 121 10*3/uL — ABNORMAL LOW (ref 150–400)
RBC: 2.21 MIL/uL — ABNORMAL LOW (ref 4.22–5.81)
RDW: 21.9 % — ABNORMAL HIGH (ref 11.5–15.5)
WBC: 17 10*3/uL — ABNORMAL HIGH (ref 4.0–10.5)
nRBC: 0 % (ref 0.0–0.2)

## 2020-03-11 LAB — BASIC METABOLIC PANEL
Anion gap: 8 (ref 5–15)
BUN: 76 mg/dL — ABNORMAL HIGH (ref 8–23)
CO2: 24 mmol/L (ref 22–32)
Calcium: 7 mg/dL — ABNORMAL LOW (ref 8.9–10.3)
Chloride: 116 mmol/L — ABNORMAL HIGH (ref 98–111)
Creatinine, Ser: 1.27 mg/dL — ABNORMAL HIGH (ref 0.61–1.24)
GFR calc Af Amer: >60 mL/min (ref >60)
GFR calc non Af Amer: 54 mL/min — ABNORMAL LOW (ref >60)
Glucose, Bld: 162 mg/dL — ABNORMAL HIGH (ref 70–99)
Potassium: 4.5 mmol/L (ref 3.5–5.1)
Sodium: 148 mmol/L — ABNORMAL HIGH (ref 135–145)

## 2020-03-11 LAB — CBC
HCT: 24.8 % — ABNORMAL LOW (ref 39.0–52.0)
HCT: 30.6 % — ABNORMAL LOW (ref 39.0–52.0)
Hemoglobin: 7.2 g/dL — ABNORMAL LOW (ref 13.0–17.0)
Hemoglobin: 9.1 g/dL — ABNORMAL LOW (ref 13.0–17.0)
MCH: 30.6 pg (ref 26.0–34.0)
MCH: 30 pg (ref 26.0–34.0)
MCHC: 29 g/dL — ABNORMAL LOW (ref 30.0–36.0)
MCHC: 29.7 g/dL — ABNORMAL LOW (ref 30.0–36.0)
MCV: 105.5 fL — ABNORMAL HIGH (ref 80.0–100.0)
MCV: 101 fL — ABNORMAL HIGH (ref 80.0–100.0)
Platelets: 130 10*3/uL — ABNORMAL LOW (ref 150–400)
Platelets: 137 10*3/uL — ABNORMAL LOW (ref 150–400)
RBC: 2.35 MIL/uL — ABNORMAL LOW (ref 4.22–5.81)
RBC: 3.03 MIL/uL — ABNORMAL LOW (ref 4.22–5.81)
RDW: 22 % — ABNORMAL HIGH (ref 11.5–15.5)
RDW: 20.2 % — ABNORMAL HIGH (ref 11.5–15.5)
WBC: 19.7 10*3/uL — ABNORMAL HIGH (ref 4.0–10.5)
WBC: 19 10*3/uL — ABNORMAL HIGH (ref 4.0–10.5)
nRBC: 0 % (ref 0.0–0.2)
nRBC: 0 % (ref 0.0–0.2)

## 2020-03-11 LAB — PREPARE RBC (CROSSMATCH)

## 2020-03-11 LAB — GLUCOSE, CAPILLARY
Glucose-Capillary: 108 mg/dL — ABNORMAL HIGH (ref 70–99)
Glucose-Capillary: 121 mg/dL — ABNORMAL HIGH (ref 70–99)
Glucose-Capillary: 122 mg/dL — ABNORMAL HIGH (ref 70–99)
Glucose-Capillary: 127 mg/dL — ABNORMAL HIGH (ref 70–99)
Glucose-Capillary: 151 mg/dL — ABNORMAL HIGH (ref 70–99)
Glucose-Capillary: 90 mg/dL (ref 70–99)

## 2020-03-11 LAB — VORICONAZOLE, SERUM: Voriconazole, Serum: 1 ug/mL

## 2020-03-11 MED ORDER — LIDOCAINE HCL (PF) 1 % IJ SOLN
INTRAMUSCULAR | Status: AC
  Administered 2020-03-11: 16:00:00 30 mL
  Filled 2020-03-11: qty 5

## 2020-03-11 MED ORDER — ENOXAPARIN SODIUM 80 MG/0.8ML ~~LOC~~ SOLN
1.0000 mg/kg | Freq: Two times a day (BID) | SUBCUTANEOUS | Status: DC
  Filled 2020-03-11: qty 0.65

## 2020-03-11 MED ORDER — FENTANYL CITRATE (PF) 100 MCG/2ML IJ SOLN
INTRAMUSCULAR | Status: AC
  Filled 2020-03-11: qty 2

## 2020-03-11 MED ORDER — FENTANYL CITRATE (PF) 100 MCG/2ML IJ SOLN: 100.0000 ug | Freq: Once | INTRAMUSCULAR | Status: AC

## 2020-03-11 MED ORDER — MIDAZOLAM HCL 2 MG/2ML IJ SOLN
INTRAMUSCULAR | Status: AC
  Administered 2020-03-11: 2 mg via INTRAVENOUS
  Filled 2020-03-11: qty 2

## 2020-03-11 MED ORDER — FENTANYL CITRATE (PF) 100 MCG/2ML IJ SOLN
100.0000 ug | Freq: Once | INTRAMUSCULAR | Status: AC
  Administered 2020-03-11: 100 ug via INTRAVENOUS

## 2020-03-11 MED ORDER — BETHANECHOL CHLORIDE 25 MG PO TABS
25.0000 mg | ORAL_TABLET | Freq: Four times a day (QID) | ORAL | Status: DC
  Administered 2020-03-11 – 2020-03-15 (×15): 25 mg
  Filled 2020-03-11 (×20): qty 1

## 2020-03-11 MED ORDER — FENTANYL CITRATE (PF) 100 MCG/2ML IJ SOLN
INTRAMUSCULAR | Status: AC
  Administered 2020-03-11: 100 ug via INTRAVENOUS
  Filled 2020-03-11: qty 2

## 2020-03-11 MED ORDER — LIDOCAINE HCL (PF) 1 % IJ SOLN
INTRAMUSCULAR | Status: AC
  Filled 2020-03-11: qty 30

## 2020-03-11 MED ORDER — MIDAZOLAM HCL 2 MG/2ML IJ SOLN: 2.0000 mg | Freq: Once | INTRAMUSCULAR | Status: AC

## 2020-03-11 MED ORDER — ALBUMIN HUMAN 25 % IV SOLN
12.5000 g | Freq: Once | INTRAVENOUS | Status: AC
  Administered 2020-03-11: 12:00:00 12.5 g via INTRAVENOUS
  Filled 2020-03-11: qty 100

## 2020-03-11 MED ORDER — DIPHENOXYLATE-ATROPINE 2.5-0.025 MG/5ML PO LIQD
10.0000 mL | Freq: Four times a day (QID) | ORAL | Status: DC
  Administered 2020-03-11 (×3): 10 mL via ORAL
  Filled 2020-03-11 (×3): qty 10

## 2020-03-11 MED ORDER — FUROSEMIDE 10 MG/ML IJ SOLN
40.0000 mg | Freq: Once | INTRAMUSCULAR | Status: AC
  Administered 2020-03-11: 40 mg via INTRAVENOUS
  Filled 2020-03-11: qty 4

## 2020-03-11 NOTE — Progress Notes (Addendum)
NAME:  Bruce Webb, MRN:  [id number], DOB:  06-12-1941, LOS: 25 ADMISSION DATE:  02/15/2020, CONSULTATION DATE:  02/19/2020 REFERRING MD:  Dr. Karleen Hampshire, CHIEF COMPLAINT:  Hypotension/ hypoxia    Brief History   79 year old male post COVID and PE in January on Eliquis admitted 2/21 to [facility] for extertional dyspnea found to be anemic with hemoccult positive stools.  GI consulted and transfused PRBC with since stabilized Hgb but has had persistent higher O2 requirements.  Was diuresed but  developed hypotension, with ongoing higher O2 requirements, PCCM was asked to consult.      Past Medical History  HTN, HFpEF, DMT2, glaucoma with progressive vision loss (left, now rapid loss in right) - recent COVID 01/16/2020 with subsequent PE on Eliquis and O2 2L [place]  Significant Hospital Events   2/21 admit. Stool ob =negatoive 2/23- echo ef 65% 2/25 PCCM consulted for hypotension and persistent hypoxemia 2/26 - Has severe desaturations with activity and eating. Required 10L while eating his meal today. No acute distress but continues to have shortness of breath. Denies chest pain.  2/27 -  -remains on 10 L.  He easily desaturates.  [name] at the bedside.  I spoke to her and subsequently also to their family friend who is in physician assistant in cardiology [place]).  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  According to the daughter he got worse with COVID-19 then got better went home but he declined again in terms of his hypoxemia.  2/28 moved to ICU, intubated. Bronch completed at bedside. Infectious disease consulted for cavitary PNA. Bactrim started for possible PCP 3/3 started on Voriconazole for + aspergillus ag 3/4 not weaning due to agitation 3/9 summary: treating for complicated cavitary PNA felt 2/2 e faecalis and aspergillus ag +  3/12 ID sign off note: stopping Voreconizole. Recommending 10-14 more days augmentin w/ f/u CT chest 2-3 weeks also  recommending MRI of brain at some point to r/o septic embolic disease 3/54 trach  3/16 getting CT abd to eval for PEG 3/17 getting OOB 1x lasix dose. Adding anxiolytic (got freq PRN sed last evening). Adding probiotic and one day of lomotil to see if we can slow down diarrhea. Having sig water loss still. Added urecholine for urinary retention.  3/18 placing foley cath and adjusting urecholine d/t urinary rentention  Consults:  GI  PCCM  Procedures:   Significant Diagnostic Tests:  2/21 CTH >> 1. No acute intracranial abnormalities. 2. Mild cerebral atrophy with chronic microvascular ischemic changes in the cerebral white matter, as above.  CTA 01/27/20 - Diffuse subpleural/peripheral interstitial ground glass opacities with increased involvement in the the lower lobes bilaterally R>L. CXR 02/16/20 R>L multifocal pneumonia R>L, no edema or effusion  2/23 TTE >> 1. Left ventricular ejection fraction, by estimation, is 60 to 65%. The left ventricle has normal function. The left ventricle has no regional wall motion abnormalities. Left ventricular diastolic parameters are consistent with Grade II diastolic dysfunction (pseudonormalization).  2. The history indicates the patient has a pulmonary embolus. The RV is  not dilated and there is normal RV function. . Right ventricular systolic function is low normal. The right ventricular size is normal.  3. The mitral valve is normal in structure and function. Mild mitral valve regurgitation. No evidence of mitral stenosis.  4. The aortic valve is normal in structure and function. Aortic valve regurgitation is not visualized. No aortic stenosis is present.  CT abd 3/17 (for PEG):1. Marked distention of the bladder with secondary slight dilatation of the renal collecting systems bilaterally. 2. Diffuse anasarca.  No ascites. 3. Small bilateral pleural effusions, left greater than right. 4. Extensive bilateral pulmonary infiltrates,  unchanged. Micro Data:  2/21 BCx 2 >> ngtd x 4 days 2/25 - CCP 2/27 - quant gold -  2/27 RVP - negative 2/28  - urine strep - negative 228 - urine leg -  2/28 - aspergillus Ab -  xxxxxxxxxxxx 3/1 BAL Cx, fungal, acid fast >> negative 3/1 BAL PCP>> negative 3/ blood cultures - ngtd 3/4 enterococcus Faecalis; few candida lusitaniana  Antimicrobials:  2/21 azithro>>2/26 2/21 ceftriaxone ->off 2/22 cefepime >>off 2/28 Bactrim >3/4 3/1 Flagyl>54f 3/2 Voriconazole >>3/12 3/4 Zyvox->off 3/12: augmentin (this was after 8 days of unasyn) Interim history/subjective:  Anxious overnight.   Objective   Blood pressure (Abnormal) 106/57, pulse 84, temperature 97.8 F (36.6 C), temperature source Axillary, resp. rate (Abnormal) 35, height _0  (1.575 m), weight 67.6 kg, SpO2 92 %. CVP:  [20 mmHg-22 mmHg] 22 mmHg  Vent Mode: PCV FiO2 (%):  [40 %-60 %] 60 % Set Rate:  [28 bmp] 28 bmp PEEP:  [5 cmH20] 5 cmH20 Plateau Pressure:  [23 cmH20-32 cmH20] 23 cmH20   Intake/Output Summary (Last 24 hours) at 03/11/2020 0854 Last data filed at 03/11/2020 0800 Gross per 24 hour  Intake 3380.71 ml  Output 1625 ml  Net 1755.71 ml   Filed Weights   03/07/20 0500 03/08/20 0400 03/09/20 0500  Weight: 65.5 kg 65.5 kg 67.6 kg   Physical Exam: General this is a 79year old [place] who is acute on chronically ill, making very slow progress, still requiring significant ventilatory tori support HEENT normocephalic atraumatic there is a small amount of blood around the tracheostomy site. Otherwise the trach stoma is unremarkable.  Pulmonary: Expiratory wheeze, equal chest rise, remains 60%, PEEP of no accessory use on support but he is tachypneic at baseline Cardiac regular rate and rhythm Abdomen soft, not tender, loose stools persist Extremities: Swollen, pulses palpable.  Diffuse anasarca particularly from pelvis down Neuro awake, follows commands, language barrier noted.  Moves all extremities but  profoundly weak and deconditioned GU: Significant urinary retention Foley cath   Resolved Hospital Problem list   Transaminitis -resolved.worsening likely in setting of shock. RUQ unrevealing Acute blood loss anemia AKI with hyperkalemia Acute urinary retention   Assessment & Plan:  79year old male never smoker with prior COVID-19 infection diagnosed in January and requiring hospitalization from 2/1-2/6, recently diagnosed pulmonary emboli on anticoagulation and end-stage glaucoma with right vision loss in addition to his known left vision loss who presents with worsening shortness of breath and admitted for acute on chronic respiratory failure secondary to multifocal pneumonia. Has had required high O2 requirements during the course of this hospitalization despite steroid therapy and transferred to the ICU on 2/28.  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 in January) and Pulmonary emboli + aspergillus ag (completed course of Voriconazole) + E facealis  Plan Continue pressure support and aerosol trach collar ventilation as tolerated  Getting him out of bed daily  Treating anxiety  Day 6 of 14 Augmentin, follow-up CT chest at completion of antibiotic course RASS goal 0  Needs PEG  Needs LTAC   Pulmonary emboli Plan Cont LMWH, we have been holding DOAC with plan for PEG in the near future, after  PEG we can resume this suspect he will need at least 6 months of therapy given his limited mobility  Hypernatremia, hyperchloremia, total body overload (+21liters) Sodium slowly improving, still appears to have general total body overload. Plan Cont free water 350 ml every 4 hrs Will try a couple dose albumin and lasix to mobilize anasarca    Urinary retention; CT abd showed marked bladder distention.  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 No acute blood loss noted. Suspect more critical illness related  Plan Continue to trend CBC and transfuse for hemoglobin less than 7  Severe physical deconditioning Plan Continue PT and OT Needs long-term acute care  Daily Goals Checklist  Pain/Anxiety/Delirium protocol (if indicated): Precedex VAP protocol (if indicated): bundle in place. Respiratory support goals: Daily SBT as tolerated.  Plan is for tracheostomy Saturday DVT prophylaxis: IV heparin Nutrition: tube feeds, at goal GI prophylaxis: pantoprazole. Urinary catheter: Condom. Central line: PICC Glucose control: SSI - euglycemic  Mobility: PT/OT consulted, advance as tolerated Code Status: full Family Communication:Discussed plan of care in [place] with patient's son Ron Agee at bedside. Disposition: ICU.-->needs LTAC eventually   Erick Colace ACNP-BC [place] Pager # [phone] OR # [phone] if no answer

## 2020-03-11 NOTE — Progress Notes (Signed)
Occupational Therapy Treatment Patient Details Name: Bruce Webb MRN: [id number] DOB: Jun 27, 1941 Today's Date: 03/11/2020    History of present illness 79 y.o. gentleman prior history of essential hypertension, type 2 diabetes mellitus, recent COVID-46 illness initially diagnosed on 01/16/2020. Patient was discharged from hospital on recent discharge from the hospital on 01/31/2020 on 2L of nasal cannula oxygen and had been found to have pulmonary embolism. 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 He presented to the emergency department with exertional dyspnea, melena, and progressive vision loss involving the right eye. Chest x-ray of 2/22: asymmetric airspace disease, right greater than left. WBC increased on 2/24; afebrile.  Intubated 2/28 due to decline in medical status. TB has been ruled out. Trach on 03/06/20.   OT comments  Pt assisted to EOB and then used lateral scoot to get pt up to chair. RT, RN in room throughout to help monitor vitals and assist with lines, family member in room near end of session. Pt with HR briefly down to 30 at EOB, but in 70s once seated in chair. Video interpreter malfunctioning, but pt not responding to questions consistently posed by son. Following commands inconsistently with multimodal cues. Pt on 60% pressure support via trach.  Follow Up Recommendations  LTACH;Supervision/Assistance - 24 hour    Equipment Recommendations  Other (comment)(defer to next venue)    Recommendations for Other Services      Precautions / Restrictions Precautions Precautions: Fall Precaution Comments: watch O2 and HR       Mobility Bed Mobility Overal bed mobility: Needs Assistance Bed Mobility: Supine to Sit     Supine to sit: +2 for physical assistance;HOB elevated;Total assist     General bed mobility comments: tot A +2 to total for all aspects of bed mobility today, use of pad to facilitate movement  Transfers Overall transfer level:  Needs assistance   Transfers: Lateral/Scoot Transfers          Lateral/Scoot Transfers: Total assist;+2 physical assistance General transfer comment: Use of pad to scoot pt to chair - ability to attempt further transfers limited by bradycardia     Balance Overall balance assessment: Needs assistance Sitting-balance support: No upper extremity supported;Feet supported Sitting balance-Leahy Scale: Poor Sitting balance - Comments: Required ~mod A at EOB - unable to progress to less assist due to bradycardia                                   ADL either performed or assessed with clinical judgement   ADL                                         General ADL Comments: requires total assist     Vision   Additional Comments: did not appear to focus on faces in any field of vision   Perception     Praxis      Cognition Arousal/Alertness: Awake/alert Behavior During Therapy: Flat affect Overall Cognitive Status: Difficult to assess                                 General Comments: Pt following <10% commands - [employer] Ipad interpreter unable to get connection, son present for end of session  Exercises General Exercises - Upper Extremity Shoulder Flexion: AAROM;Both;10 reps;Supine Elbow Flexion: PROM;Both;5 reps;Supine Elbow Extension: PROM;Both;Supine Digit Composite Flexion: AROM;Both;10 reps;Seated    Shoulder Instructions       General Comments     Pertinent Vitals/ Pain       Pain Assessment: Faces Faces Pain Scale: No hurt  Home Living                                          Prior Functioning/Environment              Frequency  Min 2X/week        Progress Toward Goals  OT Goals(current goals can now be found in the care plan section)  Progress towards OT goals: Not progressing toward goals - comment  Acute Rehab OT Goals Patient Stated Goal: none stated OT Goal  Formulation: With patient/family Time For Goal Achievement: 03/22/20 Potential to Achieve Goals: Fair  Plan Discharge plan remains appropriate    Co-evaluation    PT/OT/SLP Co-Evaluation/Treatment: Yes Reason for Co-Treatment: Complexity of the patient's impairments (multi-system involvement) PT goals addressed during session: Mobility/safety with mobility;Balance OT goals addressed during session: Strengthening/ROM      AM-PAC OT "6 Clicks" Daily Activity     Outcome Measure   Help from another person eating meals?: Total Help from another person taking care of personal grooming?: Total Help from another person toileting, which includes using toliet, bedpan, or urinal?: Total Help from another person bathing (including washing, rinsing, drying)?: Total Help from another person to put on and taking off regular upper body clothing?: Total Help from another person to put on and taking off regular lower body clothing?: Total 6 Click Score: 6    End of Session    OT Visit Diagnosis: Muscle weakness (generalized) (M62.81)   Activity Tolerance Treatment limited secondary to medical complications (Comment)(HR down to 30 momentarily with sitting EOB)   Patient Left in chair;with call bell/phone within reach;with family/visitor present;with nursing/sitter in room   Nurse Communication Need for lift equipment;Mobility status        Time: 4315-4008 OT Time Calculation (min): 23 min  Charges: OT General Charges $OT Visit: 1 Visit OT Treatments $Therapeutic Activity: 8-22 mins  Martie Round, OTR/L Acute Rehabilitation Services Pager: [phone] Office: [phone]   Evern Bio 03/11/2020, 12:49 PM

## 2020-03-11 NOTE — Progress Notes (Signed)
Patient ID: Bruce Webb, male   DOB: 20-Feb-1941, 79 y.o.   MRN: [id number]   Perc G tube requested Anatomy approved for procedure  IR PA discussed procedure with Son- answered all questions to satisfaction Son is not consenting at this time He wants to talk with family  He will be in hospital at some point today We will follow up on decision

## 2020-03-11 NOTE — Progress Notes (Signed)
Spoke with son this AM with assistance from interpreter.  He provides consent for gastrostomy tube placement.   Discussed states with RN and CCM. Episode of bradycardia this AM, however now on full vent support with stability.   Risks and benefits image guided gastrostomy tube placement was discussed with the patient including, but not limited to the need for a barium enema during the procedure, bleeding, infection, peritonitis and/or damage to adjacent structures.  All of the patient's questions were answered, patient is agreeable to proceed.  Consent signed and in chart.

## 2020-03-11 NOTE — Procedures (Signed)
Chest Tube Insertion Procedure Note  Indications:  Clinically significant Pneumothorax  Pre-operative Diagnosis: Pneumothorax  Post-operative Diagnosis: Pneumothorax and Effusion  Procedure Details  Informed consent was obtained for the procedure, including sedation.  Risks of lung perforation, hemorrhage, arrhythmia, and adverse drug reaction were discussed.   After sterile skin prep, using standard technique, a 20 French tube was placed in the right anterior 5 rib space.  Findings: 100 ml of serous fluid obtained  Estimated Blood Loss:  Minimal         Specimens:  None              Complications:  None; patient tolerated the procedure well.         Disposition: ICU - intubated and hemodynamically stable.         Condition: stable  Attending Attestation: I performed the procedure.

## 2020-03-11 NOTE — Progress Notes (Signed)
SLP Cancellation Note  Patient Details Name: Bruce Webb MRN: [id number] DOB: 19-Jul-1941   Cancelled treatment:        ST on unit when OT/PT session completed. Pt was on pressure support ventilation. PT/OT, with assist of RT to manage ventilator, transferred bed to chair. Per RT note "Pt's HR went into the 30s three times during transfer. Once in chair, attempted to put on high PS/ CPAP 20/+5 per Md. Pt again brady into 30s. Placed back on full support mode"  Defer inline PMV eval today. Will follow. When stable recommend attempting when he is on full vent support if appropriate at that time.    Royce Macadamia 03/11/2020, 1:09 PM

## 2020-03-11 NOTE — Progress Notes (Signed)
Physical Therapy Treatment Patient Details Name: Bruce Webb MRN: [id number] DOB: 1941-05-01 Today's Date: 03/11/2020    History of Present Illness 79 y.o. gentleman prior history of essential hypertension, type 2 diabetes mellitus, recent COVID-19 illness initially diagnosed on 01/16/2020. Patient was discharged from hospital on recent discharge from the hospital on 01/31/2020 on 2L of nasal cannula oxygen and had been found to have pulmonary embolism. 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 He presented to the emergency department with exertional dyspnea, melena, and progressive vision loss involving the right eye. Chest x-ray of 2/22: asymmetric airspace disease, right greater than left. WBC increased on 2/24; afebrile.  Intubated 2/28 due to decline in medical status. TB has been ruled out. Trach on 03/06/20.    PT Comments    Pt was on Pressure support trach.  Treatment was limited due to pt's HR down to 30's with activity for 10-30 sec at a time but recovered to 70's once in chair.  PT, OT, RN, RT present for treat and MD aware and on unit and request pt up to chair if possible.  Pt required total assist of 2 for transfer with RT assisting with line management.  Pt following less commands today - unable to get ipad interpretor to connect and family not present till later.  Will cont to progress as able.    Follow Up Recommendations  LTACH;Supervision/Assistance - 24 hour     Equipment Recommendations  Other (comment)(defer)    Recommendations for Other Services       Precautions / Restrictions Precautions Precautions: Fall Precaution Comments: watch O2 and HR    Mobility  Bed Mobility Overal bed mobility: Needs Assistance Bed Mobility: Supine to Sit     Supine to sit: +2 for physical assistance;HOB elevated;Total assist     General bed mobility comments: tot A +2 to total for all aspects of bed mobility today, use of pad to facilitate  movement  Transfers Overall transfer level: Needs assistance   Transfers: Lateral/Scoot Transfers          Lateral/Scoot Transfers: Total assist;+2 physical assistance General transfer comment: Use of pad to scoot pt to chair - ability to attempt further transfers limited by bradycardia (see below)  Ambulation/Gait                 Stairs             Wheelchair Mobility    Modified Rankin (Stroke Patients Only)       Balance Overall balance assessment: Needs assistance Sitting-balance support: No upper extremity supported;Feet supported Sitting balance-Leahy Scale: Poor Sitting balance - Comments: Required ~mod A at EOB - unable to progress to less assist due to bradycardia                                    Cognition Arousal/Alertness: Awake/alert Behavior During Therapy: Flat affect Overall Cognitive Status: Difficult to assess                                 General Comments: Pt following <10% commands - [employer] Ipad interpreter unable to get connection, son present for end of session      Exercises General Exercises - Lower Extremity Ankle Circles/Pumps: AAROM;Both;10 reps    General Comments General comments (skin integrity, edema, etc.): Pt on Pressure Control 60%  FiO2 and PEEP5.  Nursing reports issues with bradycardia yesterday and pt on vent support but MD would like pt up in chair.  PT, OT, RN, and RT in room for transfer with MD on unit  and aware of transfer and vitals.  At rest HR 70-80 bpm, during transfer pt down to 30's bpm for 10-30 seconds at a time, but would recover to 60's.  Once positioned in chair HR stabilized in 70's.      Pertinent Vitals/Pain Pain Assessment: No/denies pain    Home Living                      Prior Function            PT Goals (current goals can now be found in the care plan section) Progress towards PT goals: Progressing toward goals    Frequency    Min  3X/week      PT Plan Current plan remains appropriate    Co-evaluation PT/OT/SLP Co-Evaluation/Treatment: Yes Reason for Co-Treatment: Complexity of the patient's impairments (multi-system involvement);For patient/therapist safety PT goals addressed during session: Mobility/safety with mobility;Balance OT goals addressed during session: ADL's and self-care;Strengthening/ROM      AM-PAC PT "6 Clicks" Mobility   Outcome Measure  Help needed turning from your back to your side while in a flat bed without using bedrails?: Total Help needed moving from lying on your back to sitting on the side of a flat bed without using bedrails?: Total Help needed moving to and from a bed to a chair (including a wheelchair)?: Total Help needed standing up from a chair using your arms (e.g., wheelchair or bedside chair)?: Total Help needed to walk in hospital room?: Total Help needed climbing 3-5 steps with a railing? : Total 6 Click Score: 6    End of Session Equipment Utilized During Treatment: (trach on vent) Activity Tolerance: Patient limited by fatigue(and low HR during activity) Patient left: with call bell/phone within reach;in chair;with nursing/sitter in room;with family/visitor present Nurse Communication: Mobility status;Need for lift equipment(hoyer pad in place) PT Visit Diagnosis: Unsteadiness on feet (R26.81);Other abnormalities of gait and mobility (R26.89);Muscle weakness (generalized) (M62.81)     Time: 6962-9528 PT Time Calculation (min) (ACUTE ONLY): 23 min  Charges:  $Therapeutic Activity: 8-22 mins                     Bruce Webb, PT Acute Rehab Services Pager [phone] [facility] [phone] [facility] [phone]    Bruce Webb 03/11/2020, 11:31 AM

## 2020-03-11 NOTE — Progress Notes (Signed)
Assisted PT in transferring pt from the bed to the chair. Pt's HR went into the 30s three times during transfer. Once in chair, attempted to put on high PS/ CPAP 20/+5 per Md. Pt again brady into 30s. Placed back on full support mode. Md aware. Pt is stable at this time in chair.

## 2020-03-11 NOTE — Progress Notes (Signed)
CRITICAL VALUE ALERT  Critical Value:  hgb 6.8  Date & Time Notied: 03/11/20 1738  Provider Notified: Dr. Denese Killings  Orders Received/Actions taken: 1unit PRBC

## 2020-03-11 NOTE — Progress Notes (Signed)
Bladder scanned pt, results showed > 691cc's. I/O cath pt 1100cc's removed.

## 2020-03-11 NOTE — Consult Note (Signed)
Chief Complaint: Patient was seen in consultation today for dysphagia, long-term care  Referring Physician(s): Dr. Denese KillingsAgarwala  Supervising Physician: Simonne ComeWatts, John  Patient Status: [facility] - [facility]-pt  History of Present Illness: Bruce Webb is a 79 y.o. 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  Patient with poor pulmonary status and difficulty tolerating weaning or trials of trach collar.  Ultimate plan currently is to discharge to [facility] once medically ready.  Bruce Webb is in need of gastrostomy tube placement for dysphagia as well as long-term care needs.   Patient assessed this evening.  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  Per RN, Bruce Webb was able to participate in PT today, however has been lethargic since.  His son is at bedside.  Discussed gastrostomy tube placement with son via interpreter.   Past Medical History:  Diagnosis Date  . Hypertension     History reviewed. No pertinent surgical history.  Allergies: Patient has no known allergies.  Medications: Prior to Admission medications   Medication Sig Start Date End Date Taking? Authorizing Provider  acetaminophen (TYLENOL) 325 MG tablet Take 650 mg by mouth every 6 (six) hours as needed for mild pain or fever.   Yes [provider]  apixaban (ELIQUIS) 5 MG TABS tablet Take 5 mg by mouth 2 (two) times daily.   Yes [provider]  Brinzolamide-Brimonidine (SIMBRINZA) 1-0.2 % SUSP Place 1 drop into both eyes daily.   Yes [provider]  Carboxymethylcellulose Sod PF 0.5 % SOLN Place 1 drop into both eyes at bedtime.   Yes [provider]  ferrous sulfate 325 (65 FE) MG tablet Take 1 tablet (325 mg total) by mouth 2 (two) times daily with a meal. 01/31/20  Yes Amin, Ankit Chirag, MD  Ipratropium-Albuterol (COMBIVENT) 20-100 MCG/ACT AERS respimat Inhale 1 puff into the lungs every 6 (six) hours as  needed for wheezing. 01/31/20  Yes Amin, Ankit Chirag, MD  latanoprost (XALATAN) 0.005 % ophthalmic solution Place 1 drop into both eyes at bedtime.   Yes [provider]  lisinopril (ZESTRIL) 10 MG tablet Take 10-20 mg by mouth See admin instructions. Take 20mg  tablet by mouth in am & then take 10mg  tablet by mouth in evening 11/25/19  Yes [provider]  senna-docusate (SENOKOT-S) 8.6-50 MG tablet Take 2 tablets by mouth at bedtime as needed for mild constipation or moderate constipation. 01/31/20  Yes Amin, Ankit Chirag, MD  Timolol-Brimonidine-Dorzolamid 0.5-0.15-2 % SOLN Place 1 drop into both eyes daily.   Yes [provider]  apixaban (ELIQUIS) 5 MG TABS tablet Take 2 tablets (10 mg total) by mouth 2 (two) times daily for 5 days, THEN 1 tablet (5 mg total) 2 (two) times daily for 25 days. Patient not taking: Reported on 02/16/2020 01/31/20 03/01/20  Dimple NanasAmin, Ankit Chirag, MD  polyethylene glycol (MIRALAX / GLYCOLAX) 17 g packet Take 17 g by mouth daily as needed for moderate constipation or severe constipation. Patient not taking: Reported on 02/16/2020 01/31/20   Dimple NanasAmin, Ankit Chirag, MD     Family History  Problem Relation Age of Onset  . Diabetes Neg Hx     Social History   Socioeconomic History  . Marital status: Married    Spouse name: Not on file  . Number of children: Not on file  . Years of education: Not on file  . Highest education level: Not on file  Occupational  History  . Not on file  Tobacco Use  . Smoking status: Never Smoker  . Smokeless tobacco: Never Used  Substance and Sexual Activity  . Alcohol use: Not on file  . Drug use: Not on file  . Sexual activity: Not on file  Other Topics Concern  . Not on file  Social History Narrative  . Not on file   Social Determinants of Health   Financial Resource Strain:   . Difficulty of Paying Living Expenses:   Food Insecurity:   . Worried About Programme researcher, broadcasting/film/video in the Last Year:   . Engineer, site in the Last Year:   Transportation Needs:   . Freight forwarder (Medical):   Marland Kitchen Lack of Transportation (Non-Medical):   Physical Activity:   . Days of Exercise per Week:   . Minutes of Exercise per Session:   Stress:   . Feeling of Stress :   Social Connections:   . Frequency of Communication with Friends and Family:   . Frequency of Social Gatherings with Friends and Family:   . Attends Religious Services:   . Active Member of Clubs or Organizations:   . Attends Banker Meetings:   Marland Kitchen Marital Status:      Review of Systems: A 12 point ROS discussed and pertinent positives are indicated in the HPI above.  All other systems are negative.  Review of Systems  Unable to perform ROS: Acuity of condition    Vital Signs: BP (!) 106/57   Pulse 84   Temp 97.8 F (36.6 C) (Axillary)   Resp (!) 35   Ht 5\' 2"  (1.575 m)   Wt 149 lb 0.5 oz (67.6 kg)   SpO2 92%   BMI 27.26 kg/m   Physical Exam Vitals reviewed.  Constitutional:      General: Bruce Webb is not in acute distress.    Appearance: Bruce Webb is ill-appearing.  Cardiovascular:     Rate and Rhythm: Normal rate and regular rhythm.  Pulmonary:     Comments: Intubated, increased RR and WOB despite full vent support Coarse breath sounds bilaterally.  Skin:    General: Skin is warm and dry.  Neurological:     Mental Status: Bruce Webb is alert.      MD Evaluation Airway: Other (comments) Airway comments: trach Heart: WNL Abdomen: WNL Chest/ Lungs: WNL ASA  Classification: 3 Mallampati/Airway Score: Three   Imaging: CT ABDOMEN PELVIS WO CONTRAST  Result Date: 03/09/2020 CLINICAL DATA:  Dysphagia. Preprocedure evaluation for PEG tube placement. EXAM: CT ABDOMEN AND PELVIS WITHOUT CONTRAST TECHNIQUE: Multidetector CT imaging of the abdomen and pelvis was performed following the standard protocol without IV contrast. COMPARISON:  CT scan of the chest dated 03/01/2020 FINDINGS: Lower chest: Small bilateral pleural  effusions, left greater than right and extensive pulmonary infiltrates persist unchanged. Hepatobiliary: No focal liver abnormality is seen. No gallstones, gallbladder wall thickening, or biliary dilatation. Pancreas: Unremarkable. No pancreatic ductal dilatation or surrounding inflammatory changes. Spleen: Normal in size without focal abnormality. Adrenals/Urinary Tract: Adrenal glands are normal. Slight dilatation of the renal collecting systems bilaterally including the ureters. The bladder is markedly distended. I suspect this accounts for the slight dilatation of the ureters. Stomach/Bowel: NG tube in the otherwise normal appearing stomach. The bowel is otherwise normal including the terminal ileum and appendix. Rectal drain in place. Vascular/Lymphatic: No significant vascular findings are present. No enlarged abdominal or pelvic lymph nodes. Reproductive: Prostate is unremarkable. Other: Diffuse anasarca. This is  most extensive in the flanks and in the anterior aspect of the pelvis and proximal thighs. No significant subcutaneous edema anterior to the stomach. Musculoskeletal: No acute or significant osseous findings. IMPRESSION: 1. Marked distention of the bladder with secondary slight dilatation of the renal collecting systems bilaterally. 2. Diffuse anasarca.  No ascites. 3. Small bilateral pleural effusions, left greater than right. 4. Extensive bilateral pulmonary infiltrates, unchanged. Electronically Signed   By: Francene Boyers M.D.   On: 03/09/2020 13:45   DG Chest 2 View  Result Date: 02/16/2020 CLINICAL DATA:  Hypoxia. EXAM: CHEST - 2 VIEW COMPARISON:  02/15/2020 FINDINGS: Asymmetric airspace disease, right greater than left, is similar given the differential positioning. Probable small right pleural effusion. Cardiopericardial silhouette is at upper limits of normal for size. The visualized bony structures of the thorax are intact. Telemetry leads overlie the chest. IMPRESSION: Similar  appearance of asymmetric airspace disease, right greater than left. Electronically Signed   By: Kennith Center M.D.   On: 02/16/2020 09:59   CT Head Wo Contrast  Result Date: 02/15/2020 CLINICAL DATA:  79 year old male with history of right eye visual loss for the past 2 hours. EXAM: CT HEAD WITHOUT CONTRAST TECHNIQUE: Contiguous axial images were obtained from the base of the skull through the vertex without intravenous contrast. COMPARISON:  No priors. FINDINGS: Brain: Mild cerebral atrophy. Patchy and confluent areas of decreased attenuation are noted throughout the deep and periventricular white matter of the cerebral hemispheres bilaterally, compatible with chronic microvascular ischemic disease. No evidence of acute infarction, hemorrhage, hydrocephalus, extra-axial collection or mass lesion/mass effect. Vascular: No hyperdense vessel or unexpected calcification. Skull: Normal. Negative for fracture or focal lesion. Sinuses/Orbits: No acute finding. Other: None. IMPRESSION: 1. No acute intracranial abnormalities. 2. Mild cerebral atrophy with chronic microvascular ischemic changes in the cerebral white matter, as above. Electronically Signed   By: Trudie Reed M.D.   On: 02/15/2020 18:31   CT CHEST WO CONTRAST  Result Date: 03/01/2020 CLINICAL DATA:  re-evaluate cavitary lesion. post covid patient. respiratory failure., Pneumonia. EXAM: CT CHEST WITHOUT CONTRAST TECHNIQUE: Multidetector CT imaging of the chest was performed following the standard protocol without IV contrast. COMPARISON:  CT chest 02/20/2020 chest radiograph 02/28/2020 FINDINGS: Endotracheal tube in place with tip between the thoracic inlet and carina. There is a nasogastric tube in place which courses below the diaphragm with distal aspect out of field of view. There is a right upper extremity central venous catheter with tip terminating at the cavoatrial junction. Cardiovascular: Normal heart size. Trace pericardial effusion.  Normal caliber of the thoracic aorta and main pulmonary artery. Mediastinum/Nodes: No enlarged mediastinal or axillary lymph nodes. Thyroid gland, trachea, and esophagus demonstrate no significant findings. Lungs/Pleura: Central airways are patent. There are small bilateral pleural effusions, mildly increased from prior. There are again extensive bilateral heterogeneous and ground-glass airspace opacities which are increased in the right lung apices compared to prior. There is relative sparing of the left anterior upper lobe. There is again a dense area of consolidation in the anterior right upper lobe with a cavitary lesion measuring 3.2 x 2.5 cm (series 3, image 25) which is similar in appearance to prior. New small cystic space in the right lung apex measuring 1.5 cm (series 3, image 18) likely a pneumatocele. No pneumothorax. Emphysema. Upper Abdomen: No acute abnormality. Musculoskeletal: No chest wall mass or suspicious bone lesions identified. IMPRESSION: 1. Redemonstrated extensive bilateral airspace opacities, increased in the right lung apices compared to prior. Small bilateral  effusions are mildly increased from prior. 2. Similar appearance of a cavitary lesion in the anterior right upper lobe. 3. New small cyst in the right lung apex measuring 1.5 cm likely a pneumatocele. 4. Trace pericardial effusion. Electronically Signed   By: Emmaline Kluver M.D.   On: 03/01/2020 13:24   CT Chest High Resolution  Result Date: 02/20/2020 CLINICAL DATA:  Hypoxemia EXAM: CT CHEST WITHOUT CONTRAST TECHNIQUE: Multidetector CT imaging of the chest was performed following the standard protocol without intravenous contrast. High resolution imaging of the lungs, as well as inspiratory and expiratory imaging, was performed. COMPARISON:  Chest radiographs, 02/16/2020, CT chest angiogram, 01/27/2020 FINDINGS: Cardiovascular: No significant vascular findings. Normal heart size. No pericardial effusion. Mediastinum/Nodes:  No enlarged mediastinal, hilar, or axillary lymph nodes. Thyroid gland, trachea, and esophagus demonstrate no significant findings. Lungs/Pleura: New small bilateral pleural effusions. Significant interval increase in extensive bilateral heterogeneous and ground-glass airspace opacity, particularly in the lower lungs. There is a new area of very dense consolidation of the right upper lobe and a new cavitary lesion of the anterior right upper lobe measuring 3.2 x 2.8 cm (series 5, image 57). Mild underlying centrilobular emphysema. Upper Abdomen: No acute abnormality. Musculoskeletal: No chest wall mass or suspicious bone lesions identified. IMPRESSION: 1. Significant interval increase in extensive bilateral heterogeneous and ground-glass airspace opacity, particularly in the lower lungs, consistent with worsened multifocal infection and/or ARDS. 2. There is a new area of very dense consolidation of the right upper lobe and a new cavitary lesion of the anterior right upper lobe measuring 3.2 x 2.8 cm. Findings are concerning for superimposed secondary infection. 3.  New small bilateral pleural effusions. 4.  Emphysema (ICD10-J43.9). Electronically Signed   By: Lauralyn Primes M.D.   On: 02/20/2020 18:57   US Abdomen Limited  Result Date: 02/19/2020 CLINICAL DATA:  Elevated LFTs EXAM: ULTRASOUND ABDOMEN LIMITED RIGHT UPPER QUADRANT COMPARISON:  None. FINDINGS: Gallbladder: No gallstones or wall thickening visualized. No sonographic Murphy sign noted by sonographer. Common bile duct: Diameter: 5 mm Liver: No focal lesion identified. Within normal limits in parenchymal echogenicity. Portal vein is patent on color Doppler imaging with normal direction of blood flow towards the liver. Other: None. IMPRESSION: No acute abnormality. No specific sonographic abnormality identified to explain the patient's elevated LFTs. Electronically Signed   By: Katherine Mantle M.D.   On: 02/19/2020 19:21   DG Chest Port 1  View  Result Date: 03/09/2020 CLINICAL DATA:  Acute respiratory failure EXAM: PORTABLE CHEST 1 VIEW COMPARISON:  Chest x-ray of 03/06/2020 FINDINGS: Tracheostomy tube remains in place, tip between clavicular heads. Right PICC line in the caval to atrial junction. Gastric tube in the stomach, side port below GE junction. Cardiomediastinal contours are stable and partially obscured due to interstitial and airspace disease. Potential increase in pleural fluid tracking along the right lateral chest compared to the prior study. Pneumothorax seen on the previous exam is not visible on the current evaluation. Small amount of fluid tracking over the right lung apex. Visualized skeletal structures are unremarkable. IMPRESSION: Potential increase in pleural fluid tracking along the right lateral chest into the right lung apex compared to the prior study. Pneumothorax seen on the previous exam is not visible on the current evaluation. Diffuse airspace disease and interstitial prominence with no change again suspicious for multifocal pneumonia. Electronically Signed   By: Donzetta Kohut M.D.   On: 03/09/2020 08:46   DG Chest Port 1 View  Result Date: 03/06/2020 CLINICAL DATA:  Status post tracheostomy today. EXAM: PORTABLE CHEST 1 VIEW COMPARISON:  Single-view of the chest 02/28/2020. FINDINGS: New tracheostomy tube is in place with the tip projecting good position at the level of clavicular heads. Right PICC and NG tube are unchanged. Extensive bilateral airspace disease persists. Heart size is normal. The patient has a small right apical pneumothorax, less than 5%. IMPRESSION: Tracheostomy tube in good position. Small right pneumothorax, less than 5%. Extensive bilateral airspace disease compatible with pneumonia. Critical Value/emergent results were called by telephone at the time of interpretation on 03/06/2020 at 12:29 pm to provider Devonne Doughty, who verbally acknowledged these results. Electronically Signed   By:  Inge Rise M.D.   On: 03/06/2020 12:32   DG CHEST PORT 1 VIEW  Result Date: 02/28/2020 CLINICAL DATA:  Follow-up pneumonia. EXAM: PORTABLE CHEST 1 VIEW COMPARISON:  02/25/2020 FINDINGS: The endotracheal tube is in satisfactory position. Nasogastric tube extending into the stomach with its side hole in the proximal stomach. Right PICC tip at the superior cavoatrial junction. The cardiac silhouette remains near the upper limit of normal in size. Mild increase in patchy densities in both lungs with stable underlying prominence of the interstitial markings. No definite pleural fluid seen. Thoracic and upper lumbar spine degenerative changes. IMPRESSION: 1. Mild increase in patchy densities in both lungs, suspicious for progressive pneumonia. 2. 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 Electronically Signed   By: Claudie Revering M.D.   On: 02/28/2020 14:03   DG Chest Port 1 View  Result Date: 02/25/2020 CLINICAL DATA:  COVID pneumonia. EXAM: PORTABLE CHEST 1 VIEW COMPARISON:  Chest x-ray 02/22/2020 FINDINGS: The endotracheal tube is 5 cm above the carina. The NG tube is coursing down the esophagus and into the stomach. The right PICC line tip is in good position with its tip near the cavoatrial junction. The cardiac silhouette, mediastinal and hilar contours are within normal limits and stable. Persistent bilateral patchy interstitial and airspace infiltrates consistent with COVID pneumonia. No new or progressive findings. No pleural effusions. IMPRESSION: 1. Stable support apparatus.  New right PICC line in good position. 2. Persistent bilateral interstitial and airspace infiltrates. Electronically Signed   By: Marijo Sanes M.D.   On: 02/25/2020 11:59   Portable Chest x-ray  Result Date: 02/22/2020 CLINICAL DATA:  Cavitary pneumonia EXAM: PORTABLE CHEST 1 VIEW COMPARISON:  02/22/2020 FINDINGS: There are persistent diffuse bilateral airspace opacities, grossly similar  to prior study. The endotracheal tube terminates approximately 5.2 cm above the carina. The enteric tube extends below the left hemidiaphragm. The heart size is stable. There is no pneumothorax. No large pleural effusion. No acute osseous abnormality. IMPRESSION: Persistent diffuse bilateral airspace opacities, grossly similar to prior study. Endotracheal tube as above. Electronically Signed   By: Constance Holster M.D.   On: 02/22/2020 17:44   DG CHEST PORT 1 VIEW  Result Date: 02/22/2020 CLINICAL DATA:  Acute respiratory failure, hypoxia EXAM: PORTABLE CHEST 1 VIEW COMPARISON:  02/16/2020 FINDINGS: Patchy bilateral interstitial and alveolar airspace opacities involving the upper and lower lobes. Trace left pleural effusion. No significant right pleural effusion. No pneumothorax. Stable cardiomediastinal silhouette. No aggressive osseous lesion. IMPRESSION: Persistent patchy bilateral interstitial and alveolar airspace opacities most concerning for multilobar pneumonia including atypical viral pneumonia. Overall there is no significant interval change accounting for differences in technique. Electronically Signed   By: Kathreen Devoid   On: 02/22/2020 13:43   DG Chest Port 1 View  Result Date: 02/15/2020 CLINICAL DATA:  79 year old  male with history of shortness of breath. Loss of vision. EXAM: PORTABLE CHEST 1 VIEW COMPARISON:  Chest x-ray 01/26/2020. FINDINGS: Patchy multifocal airspace consolidation throughout the lungs bilaterally, most confluent in the inferior aspect of the right upper lobe. Aeration has significantly worsened compared to the prior study. Probable small right pleural effusion. No pneumothorax. No evidence of pulmonary edema. Heart size is borderline enlarged. Upper mediastinal contours are within normal limits. IMPRESSION: 1. Worsening multilobar bilateral pneumonia, as above. 2. New small right pleural effusion. Electronically Signed   By: Trudie Reed M.D.   On: 02/15/2020 17:55    DG Abd Portable 1V  Result Date: 03/06/2020 CLINICAL DATA:  NG tube placement EXAM: PORTABLE ABDOMEN - 1 VIEW COMPARISON:  None. FINDINGS: Enteric tube terminates in the distal gastric body. Nonobstructive bowel gas pattern. Multifocal pneumonia in the visualized lungs. IMPRESSION: Enteric tube terminates in the distal gastric body. Electronically Signed   By: Charline Bills M.D.   On: 03/06/2020 12:14   ECHOCARDIOGRAM COMPLETE  Result Date: 02/17/2020    ECHOCARDIOGRAM REPORT   Patient Name:   [facility] Date of Exam: 02/17/2020 Medical Rec #:  [id number]      Height:       62.0 in Accession #:    [id number]     Weight:       116.0 lb Date of Birth:  10/02/1941       BSA:          1.516 m Patient Age:    79 years       BP:           100/81 mmHg Patient Gender: M              HR:           90 bpm. Exam Location:  Inpatient Procedure: 2D Echo Indications:    Dyspnea 786.09 / R06.00  History:        Patient has no prior history of Echocardiogram examinations.                 Risk Factors:Diabetes and Hypertension. Pulmonary embolism                 Acute respiratory failure with hypoxia.  Sonographer:    Leeroy Bock Turrentine Referring Phys: [phone] PREETHA JOSEPH  Sonographer Comments: Image acquisition challenging due to respiratory motion. IMPRESSIONS  1. Left ventricular ejection fraction, by estimation, is 60 to 65%. The left ventricle has normal function. The left ventricle has no regional wall motion abnormalities. Left ventricular diastolic parameters are consistent with Grade II diastolic dysfunction (pseudonormalization).  2. The history indicates the patient has a pulmonary embolus. The RV is not dilated and there is normal RV function. . Right ventricular systolic function is low normal. The right ventricular size is normal.  3. The mitral valve is normal in structure and function. Mild mitral valve regurgitation. No evidence of mitral stenosis.  4. The aortic valve is normal in structure and  function. Aortic valve regurgitation is not visualized. No aortic stenosis is present. FINDINGS  Left Ventricle: Left ventricular ejection fraction, by estimation, is 60 to 65%. The left ventricle has normal function. The left ventricle has no regional wall motion abnormalities. The left ventricular internal cavity size was normal in size. There is  no left ventricular hypertrophy. Left ventricular diastolic parameters are consistent with Grade II diastolic dysfunction (pseudonormalization). Right Ventricle: The history indicates the patient has a pulmonary embolus. The RV is not dilated  and there is normal RV function. The right ventricular size is normal. No increase in right ventricular wall thickness. Right ventricular systolic function is low normal. Left Atrium: Left atrial size was normal in size. Right Atrium: Right atrial size was normal in size. Pericardium: There is no evidence of pericardial effusion. Mitral Valve: The mitral valve is normal in structure and function. Mild mitral valve regurgitation. No evidence of mitral valve stenosis. Tricuspid Valve: The tricuspid valve is normal in structure. Tricuspid valve regurgitation is trivial. Aortic Valve: The aortic valve is normal in structure and function. Aortic valve regurgitation is not visualized. No aortic stenosis is present. Aortic valve mean gradient measures 5.0 mmHg. Aortic valve peak gradient measures 8.0 mmHg. Aortic valve area, by VTI measures 2.64 cm. Pulmonic Valve: The pulmonic valve was normal in structure. Pulmonic valve regurgitation is not visualized. Aorta: The aortic root and ascending aorta are structurally normal, with no evidence of dilitation. IAS/Shunts: The atrial septum is grossly normal.  LEFT VENTRICLE PLAX 2D LVIDd:         3.70 cm  Diastology LVIDs:         2.70 cm  LV e' lateral:   6.42 cm/s LV PW:         0.90 cm  LV E/e' lateral: 10.1 LV IVS:        0.90 cm  LV e' medial:    5.66 cm/s LVOT diam:     1.90 cm  LV E/e'  medial:  11.4 LV SV:         61 LV SV Index:   40 LVOT Area:     2.84 cm  RIGHT VENTRICLE RV S prime:     17.00 cm/s LEFT ATRIUM             Index LA diam:        3.40 cm 2.24 cm/m LA Vol (A2C):   30.0 ml 19.78 ml/m LA Vol (A4C):   28.2 ml 18.60 ml/m LA Biplane Vol: 29.4 ml 19.39 ml/m  AORTIC VALVE AV Area (Vmax):    2.25 cm AV Area (Vmean):   2.30 cm AV Area (VTI):     2.64 cm AV Vmax:           141.00 cm/s AV Vmean:          105.000 cm/s AV VTI:            0.230 m AV Peak Grad:      8.0 mmHg AV Mean Grad:      5.0 mmHg LVOT Vmax:         112.00 cm/s LVOT Vmean:        85.300 cm/s LVOT VTI:          0.214 m LVOT/AV VTI ratio: 0.93  AORTA Ao Root diam: 3.30 cm MITRAL VALVE MV Area (PHT): 3.99 cm    SHUNTS MV Decel Time: 190 msec    Systemic VTI:  0.21 m MV E velocity: 64.70 cm/s  Systemic Diam: 1.90 cm MV A velocity: 72.40 cm/s MV E/A ratio:  0.89 Kristeen Miss MD Electronically signed by Kristeen Miss MD Signature Date/Time: 02/17/2020/3:13:26 PM    Final    [place] EKG SITE RITE  Result Date: 02/22/2020 If Site Rite image not attached, placement could not be confirmed due to current cardiac rhythm.   Labs:  CBC: Recent Labs    03/08/20 0407 03/09/20 0408 03/10/20 0455 03/11/20 0421  WBC 16.4* 19.4* 21.4* 19.7*  HGB 6.5* 7.9* 7.6*  7.2*  HCT 23.3* 26.2* 25.7* 24.8*  PLT 141* 129* 129* 130*    COAGS: Recent Labs    02/15/20 1845 02/17/20 2301 02/19/20 1635 02/20/20 0509 02/26/20 0440 02/27/20 0425 02/28/20 0344 03/06/20 0827  INR 1.7*  --  1.2  --   --   --   --  1.1  APTT  --    < >  --    < > 91* 60* 60* 28   < > = values in this interval not displayed.    BMP: Recent Labs    03/08/20 0407 03/09/20 0408 03/10/20 0455 03/11/20 0421  NA 154* 151* 150* 148*  K 3.8 3.7 4.3 4.5  CL 120* 116* 115* 116*  CO2 GLUCOSE 190* 142* 164* 162*  BUN 67* 73* 73* 76*  CALCIUM 7.3* 7.3* 7.3* 7.0*  CREATININE 1.10 1.15 1.14 1.27*  GFRNONAA >60 >60 >60 54*  GFRAA >60  >60 >60 >60    LIVER FUNCTION TESTS: Recent Labs    02/25/20 0428 02/26/20 0440 02/27/20 0425 02/28/20 0344  BILITOT 0.4 0.5 0.5 0.5  AST 22 26 35 43*  ALT 40 32 31 26  ALKPHOS 138* 123 111 116  PROT 5.0* 4.9* 5.1* 4.8*  ALBUMIN 1.3* 1.3* 1.4* 1.3*    TUMOR MARKERS: No results for input(s): AFPTM, CEA, CA199, CHROMGRNA in the last 8760 hours.  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  Currently has NGT in place.   CT Abdomen Pelvis 03/09/20 reviewed by Dr. Lowella Dandy. Patient anatomy is amenable to percutaneous placement.   PA to bedside to discuss with son via interpreter.  Bruce Webb does state family is interested in moving forward with care and interventions as needed to support Bruce Webb, but would like to discuss procedure with family prior to providing consent.  During my visit, patient does have one episode of bradycardia which resolves with stimulation.  Discussed medical status with CCM NP who states patient stable on full vent support.   Plan to follow-up with family today once available to discuss gastrostomy tube placement again.    Thank you for this interesting consult.  I greatly enjoyed meeting Bruce Webb and look forward to participating in their care.  A copy of this report was sent to the requesting provider on this date.  Electronically Signed: Hoyt Koch, PA 03/11/2020, 10:26 AM   I spent a total of 40 Minutes    in face to face in clinical consultation, greater than 50% of which was counseling/coordinating care for dysphagia.

## 2020-03-12 ENCOUNTER — Inpatient Hospital Stay (HOSPITAL_COMMUNITY): Payer: Medicare Other

## 2020-03-12 HISTORY — PX: IR GASTROSTOMY TUBE MOD SED: IMG625

## 2020-03-12 LAB — BASIC METABOLIC PANEL
Anion gap: 8 (ref 5–15)
BUN: 58 mg/dL — ABNORMAL HIGH (ref 8–23)
CO2: 28 mmol/L (ref 22–32)
Calcium: 7.4 mg/dL — ABNORMAL LOW (ref 8.9–10.3)
Chloride: 112 mmol/L — ABNORMAL HIGH (ref 98–111)
Creatinine, Ser: 1.05 mg/dL (ref 0.61–1.24)
GFR calc Af Amer: >60 mL/min (ref >60)
GFR calc non Af Amer: >60 mL/min (ref >60)
Glucose, Bld: 67 mg/dL — ABNORMAL LOW (ref 70–99)
Potassium: 4.2 mmol/L (ref 3.5–5.1)
Sodium: 148 mmol/L — ABNORMAL HIGH (ref 135–145)

## 2020-03-12 LAB — GLUCOSE, CAPILLARY
Glucose-Capillary: 54 mg/dL — ABNORMAL LOW (ref 70–99)
Glucose-Capillary: 76 mg/dL (ref 70–99)
Glucose-Capillary: 81 mg/dL (ref 70–99)
Glucose-Capillary: 76 mg/dL (ref 70–99)
Glucose-Capillary: 97 mg/dL (ref 70–99)
Glucose-Capillary: 90 mg/dL (ref 70–99)

## 2020-03-12 LAB — CBC
HCT: 31.3 % — ABNORMAL LOW (ref 39.0–52.0)
Hemoglobin: 9.4 g/dL — ABNORMAL LOW (ref 13.0–17.0)
MCH: 30.5 pg (ref 26.0–34.0)
MCHC: 30 g/dL (ref 30.0–36.0)
MCV: 101.6 fL — ABNORMAL HIGH (ref 80.0–100.0)
Platelets: 128 10*3/uL — ABNORMAL LOW (ref 150–400)
RBC: 3.08 MIL/uL — ABNORMAL LOW (ref 4.22–5.81)
RDW: 20.1 % — ABNORMAL HIGH (ref 11.5–15.5)
WBC: 20.3 10*3/uL — ABNORMAL HIGH (ref 4.0–10.5)
nRBC: 0 % (ref 0.0–0.2)

## 2020-03-12 LAB — BRAIN NATRIURETIC PEPTIDE: B Natriuretic Peptide: 499.8 pg/mL — ABNORMAL HIGH (ref 0.0–100.0)

## 2020-03-12 LAB — BPAM RBC
Blood Product Expiration Date: 202104132359
Blood Product Expiration Date: 202104222359
ISSUE DATE / TIME: 202103150752
ISSUE DATE / TIME: 202103181832
Unit Type and Rh: 5100
Unit Type and Rh: 5100

## 2020-03-12 LAB — TYPE AND SCREEN
ABO/RH(D): O POS
Antibody Screen: NEGATIVE
Unit division: 0
Unit division: 0

## 2020-03-12 LAB — C-REACTIVE PROTEIN: CRP: 12.5 mg/dL — ABNORMAL HIGH (ref <1.0)

## 2020-03-12 MED ORDER — FENTANYL CITRATE (PF) 100 MCG/2ML IJ SOLN
INTRAMUSCULAR | Status: AC | PRN
  Administered 2020-03-12: 50 ug via INTRAVENOUS

## 2020-03-12 MED ORDER — CEFAZOLIN SODIUM-DEXTROSE 1-4 GM/50ML-% IV SOLN
INTRAVENOUS | Status: AC | PRN
  Administered 2020-03-12: 2 g via INTRAVENOUS

## 2020-03-12 MED ORDER — CEFAZOLIN SODIUM-DEXTROSE 2-4 GM/100ML-% IV SOLN
INTRAVENOUS | Status: AC
  Filled 2020-03-12: qty 100

## 2020-03-12 MED ORDER — CHLORHEXIDINE GLUCONATE 0.12% ORAL RINSE (MEDLINE KIT)
15.0000 mL | Freq: Two times a day (BID) | OROMUCOSAL | Status: DC
  Administered 2020-03-12 – 2020-03-15 (×6): 15 mL via OROMUCOSAL

## 2020-03-12 MED ORDER — FENTANYL CITRATE (PF) 100 MCG/2ML IJ SOLN
25.0000 ug | INTRAMUSCULAR | Status: AC | PRN
  Administered 2020-03-12 – 2020-03-13 (×5): 50 ug via INTRAVENOUS
  Filled 2020-03-12 (×5): qty 2

## 2020-03-12 MED ORDER — DEXTROSE 5 % IV SOLN: INTRAVENOUS | Status: DC

## 2020-03-12 MED ORDER — CEFAZOLIN SODIUM-DEXTROSE 2-4 GM/100ML-% IV SOLN
2.0000 g | Freq: Once | INTRAVENOUS | Status: AC
  Administered 2020-03-12: 16:00:00 2 g via INTRAVENOUS
  Filled 2020-03-12: qty 100

## 2020-03-12 MED ORDER — GLUCAGON HCL (RDNA) 1 MG IJ SOLR
INTRAMUSCULAR | Status: AC | PRN
  Administered 2020-03-12: .5 mg via INTRAVENOUS

## 2020-03-12 MED ORDER — ORAL CARE MOUTH RINSE
15.0000 mL | OROMUCOSAL | Status: DC
  Administered 2020-03-12 – 2020-03-15 (×28): 15 mL via OROMUCOSAL

## 2020-03-12 MED ORDER — FENTANYL CITRATE (PF) 100 MCG/2ML IJ SOLN
INTRAMUSCULAR | Status: AC
  Filled 2020-03-12: qty 2

## 2020-03-12 MED ORDER — GLUCAGON HCL RDNA (DIAGNOSTIC) 1 MG IJ SOLR
INTRAMUSCULAR | Status: AC
  Filled 2020-03-12: qty 1

## 2020-03-12 MED ORDER — INSULIN GLARGINE 100 UNIT/ML ~~LOC~~ SOLN
15.0000 [IU] | Freq: Every day | SUBCUTANEOUS | Status: DC
  Administered 2020-03-13 – 2020-03-15 (×3): 15 [IU] via SUBCUTANEOUS
  Filled 2020-03-12 (×3): qty 0.15

## 2020-03-12 MED ORDER — FUROSEMIDE 10 MG/ML IJ SOLN
40.0000 mg | Freq: Once | INTRAMUSCULAR | Status: AC
  Administered 2020-03-12: 40 mg via INTRAVENOUS
  Filled 2020-03-12: qty 4

## 2020-03-12 MED ORDER — DEXTROSE 50 % IV SOLN
INTRAVENOUS | Status: AC
  Administered 2020-03-12: 50 mL
  Filled 2020-03-12: qty 50

## 2020-03-12 MED ORDER — MIDAZOLAM HCL 2 MG/2ML IJ SOLN
INTRAMUSCULAR | Status: AC | PRN
  Administered 2020-03-12: 1 mg via INTRAVENOUS

## 2020-03-12 MED ORDER — DEXTROSE-NACL 5-0.45 % IV SOLN: INTRAVENOUS | Status: DC

## 2020-03-12 MED ORDER — IOHEXOL 300 MG/ML  SOLN
50.0000 mL | Freq: Once | INTRAMUSCULAR | Status: AC | PRN
  Administered 2020-03-12: 20 mL

## 2020-03-12 MED ORDER — CHLORHEXIDINE GLUCONATE 0.12 % MT SOLN
OROMUCOSAL | Status: AC
  Filled 2020-03-12: qty 15

## 2020-03-12 MED ORDER — MIDAZOLAM HCL 2 MG/2ML IJ SOLN
INTRAMUSCULAR | Status: AC
  Filled 2020-03-12: qty 2

## 2020-03-12 MED ORDER — ALBUMIN HUMAN 25 % IV SOLN
12.5000 g | Freq: Once | INTRAVENOUS | Status: AC
  Administered 2020-03-12: 10:00:00 12.5 g via INTRAVENOUS
  Filled 2020-03-12: qty 100

## 2020-03-12 MED ORDER — NOREPINEPHRINE 4 MG/250ML-% IV SOLN
0.0000 ug/min | INTRAVENOUS | Status: DC
  Administered 2020-03-12 (×2): 4 ug/min via INTRAVENOUS
  Administered 2020-03-13: 18:00:00 10 ug/min via INTRAVENOUS
  Administered 2020-03-13: 13:00:00 3 ug/min via INTRAVENOUS
  Administered 2020-03-14: 01:00:00 12 ug/min via INTRAVENOUS
  Administered 2020-03-14: 20:00:00 7 ug/min via INTRAVENOUS
  Administered 2020-03-14: 11 ug/min via INTRAVENOUS
  Administered 2020-03-14: 14:00:00 13 ug/min via INTRAVENOUS
  Administered 2020-03-15: 12:00:00 5 ug/min via INTRAVENOUS
  Filled 2020-03-12 (×7): qty 250

## 2020-03-12 MED ORDER — LIDOCAINE HCL 1 % IJ SOLN
INTRAMUSCULAR | Status: AC
  Filled 2020-03-12: qty 20

## 2020-03-12 MED ORDER — LIDOCAINE HCL (PF) 1 % IJ SOLN
INTRAMUSCULAR | Status: AC | PRN
  Administered 2020-03-12: 10 mL

## 2020-03-12 MED ORDER — ENOXAPARIN SODIUM 40 MG/0.4ML ~~LOC~~ SOLN
40.0000 mg | Freq: Every day | SUBCUTANEOUS | Status: DC
  Administered 2020-03-13: 40 mg via SUBCUTANEOUS
  Filled 2020-03-12: qty 0.4

## 2020-03-12 NOTE — Progress Notes (Signed)
eLink Physician-Brief Progress Note Patient Name: Bruce Webb DOB: 1941/06/02 MRN: [id number]   Date of Service  03/12/2020  HPI/Events of Note  Pt needs a.m. CBC order and order for resumption of DVT prophylaxis Lovenox per bedside RN.  eICU Interventions  A.m. CBC ordered, Lovenox 40 mg SQ Q 24 hours ordered.        Thomasene Lot [name] 03/12/2020, 11:58 PM

## 2020-03-12 NOTE — Progress Notes (Signed)
NAME:  Bruce Webb, MRN:  [id number], DOB:  Aug 06, 1941, LOS: 67 ADMISSION DATE:  02/15/2020, CONSULTATION DATE:  02/19/2020 REFERRING MD:  Dr. Karleen Hampshire, CHIEF COMPLAINT:  Hypotension/ hypoxia    Brief History   79 year old male post COVID and PE in January on Eliquis admitted 2/21 to [facility] for extertional dyspnea found to be anemic with hemoccult positive stools.  GI consulted and transfused PRBC with since stabilized Hgb but has had persistent higher O2 requirements.  Was diuresed but  developed hypotension, with ongoing higher O2 requirements, PCCM was asked to consult.      Past Medical History  HTN, HFpEF, DMT2, glaucoma with progressive vision loss (left, now rapid loss in right) - recent COVID 01/16/2020 with subsequent PE on Eliquis and O2 2L   Significant Hospital Events   2/21 admit. Stool ob =negatoive 2/23- echo ef 65% 2/25 PCCM consulted for hypotension and persistent hypoxemia 2/26 - Has severe desaturations with activity and eating. Required 10L while eating his meal today. No acute distress but continues to have shortness of breath. Denies chest pain.  2/27 -  -remains on 10 L.  He easily desaturates.  [name] at the bedside.  I spoke to her and subsequently also to their family friend who is in physician assistant in cardiology [employer]).  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  According to the daughter he got worse with COVID-19 then got better went home but he declined again in terms of his hypoxemia.  2/28 moved to ICU, intubated. Bronch completed at bedside. Infectious disease consulted for cavitary PNA. Bactrim started for possible PCP 3/3 started on Voriconazole for + aspergillus ag 3/4 not weaning due to agitation 3/9 summary: treating for complicated cavitary PNA felt 2/2 e faecalis and aspergillus ag +  3/12 ID sign off note: stopping Voreconizole. Recommending 10-14 more days augmentin w/ f/u CT chest 2-3 weeks also  recommending MRI of brain at some point to r/o septic embolic disease 6/64 trach  3/16 getting CT abd to eval for PEG 3/17 getting OOB 1x lasix dose. Adding anxiolytic (got freq PRN sed last evening). Adding probiotic and one day of lomotil to see if we can slow down diarrhea. Having sig water loss still. Added urecholine for urinary retention.  3/18 placing foley cath and adjusting urecholine d/t urinary rentention, developed spontaneous right pneumothorax requiring chest tube 3/19 chest x-ray improved, tolerating diuresis.  Awaiting PEG tube. Consults:  GI  PCCM  Procedures:   Significant Diagnostic Tests:  2/21 CTH >> 1. No acute intracranial abnormalities. 2. Mild cerebral atrophy with chronic microvascular ischemic changes in the cerebral white matter, as above.  CTA 01/27/20 - Diffuse subpleural/peripheral interstitial ground glass opacities with increased involvement in the the lower lobes bilaterally R>L. CXR 02/16/20 R>L multifocal pneumonia R>L, no edema or effusion  2/23 TTE >> 1. Left ventricular ejection fraction, by estimation, is 60 to 65%. The left ventricle has normal function. The left ventricle has no regional wall motion abnormalities. Left ventricular diastolic parameters are consistent with Grade II diastolic dysfunction (pseudonormalization).  2. The history indicates the patient has a pulmonary embolus. The RV is  not dilated and there is normal RV function. . Right ventricular systolic function is low normal. The right ventricular size is normal.  3. The mitral valve is normal in structure and function. Mild mitral valve regurgitation. No evidence of mitral stenosis.  4. The aortic valve is  normal in structure and function. Aortic valve regurgitation is not visualized. No aortic stenosis is present.  CT abd 3/17 (for PEG):1. Marked distention of the bladder with secondary slight dilatation of the renal collecting systems bilaterally. 2. Diffuse anasarca.  No  ascites. 3. Small bilateral pleural effusions, left greater than right. 4. Extensive bilateral pulmonary infiltrates, unchanged. Micro Data:  2/21 BCx 2 >> ngtd x 4 days 2/25 - CCP 2/27 - quant gold -  2/27 RVP - negative 2/28  - urine strep - negative 228 - urine leg -  2/28 - aspergillus Ab -  xxxxxxxxxxxx 3/1 BAL Cx, fungal, acid fast >> negative 3/1 BAL PCP>> negative 3/ blood cultures - ngtd 3/4 enterococcus Faecalis; few candida lusitaniana  Antimicrobials:  2/21 azithro>>2/26 2/21 ceftriaxone ->off 2/22 cefepime >>off 2/28 Bactrim >3/4 3/1 Flagyl>55f 3/2 Voriconazole >>3/12 3/4 Zyvox->off 3/12: augmentin (this was after 8 days of unasyn) Interim history/subjective:  Appears comfortable  Objective   Blood pressure 122/60, pulse 76, temperature (Abnormal) 97.3 F (36.3 C), temperature source Axillary, resp. rate (Abnormal) 36, height _0  (1.575 m), weight 68 kg, SpO2 100 %.    Vent Mode: PCV FiO2 (%):  [50 %-60 %] 50 % Set Rate:  [28 bmp] 28 bmp PEEP:  [5 cmH20] 5 cmH20 Pressure Support:  [20 cmH20] 20 cmH20 Plateau Pressure:  [31 cmH20-35 cmH20] 31 cmH20   Intake/Output Summary (Last 24 hours) at 03/12/2020 0918 Last data filed at 03/12/2020 0600 Gross per 24 hour  Intake 1890.2 ml  Output 5780 ml  Net -3889.8 ml   Filed Weights   03/08/20 0400 03/09/20 0500 03/12/20 0238  Weight: 65.5 kg 67.6 kg 68 kg   Physical Exam: General: This is a debilitated 79year old [place] currently on full ventilator for a support, appears comfortable on a.m. assessment HEENT normal atraumatic no jugular venous distention appreciated Pulmonary decreased bilaterally.  Right chest tube in place with approximately 1 L of pleural output which is clear appearing.  There is no air leak noted on a.m. assessment Cardiac: Regular rate and rhythm Abdomen soft nontender Extremities diffuse anasarca persists Neuro awake, very hard of hearing, has also visual deficits GU clear  yellow via Foley catheter   Resolved Hospital Problem list   Transaminitis -resolved.worsening likely in setting of shock. RUQ unrevealing Acute blood loss anemia AKI with hyperkalemia Acute urinary retention   Assessment & Plan:  79year old male never smoker with prior COVID-19 infection diagnosed in January and requiring hospitalization from 2/1-2/6, recently diagnosed pulmonary emboli on anticoagulation and end-stage glaucoma with right vision loss in addition to his known left vision loss who presents with worsening shortness of breath and admitted for acute on chronic respiratory failure secondary to multifocal pneumonia. Has had required high O2 requirements during the course of this hospitalization despite steroid therapy and transferred to the ICU on 2/28.  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 Continue daily assessment for diuretics  Out of bed daily  Treat anxiety  Day 7 of 14 Augmentin, will need follow-up CT of chest after completion of antibiotics  RASS goal 0  PEG planned for later today  Assessing him for LTAC  appropriateness  Spontaneous right pneumothorax Status post chest tube placement 3/18, no notable air leak on a.m. however has had significant pleural drainage, over 1 L. Portable chest x-ray personally reviewed, right chest tube apically positioned, Needs to demonstrate small apical pneumothorax, diffuse bilateral airspace disease persists, perhaps marginally improved aeration Plan We will continue chest tube at 20 cm suction A.m. chest x-ray  Pulmonary emboli Plan On low molecular weight  heparin, holding DOAC, can resume DOAC after PEG will likely need at least 6 months of therapy given limited mobility   AKI, hypernatremia, hyperchloremia, total body overload was 21 L positive on 3/18 now at 19.8 L positive, creatinine improved Plan Free water is been held due to n.p.o. status, discontinued ordered D5 half-normal, placed him on D5 water at 50 mL an hour He had a nice diuretic response to albumin and Lasix on 3/18, we will repeat this Check a.m. chemistry   Urinary retention; CT abd showed marked bladder distention.  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 for PEG, will need to watch his glycemic control closely  Anemia and thrombocytopenia  Suspect primarily anemia of critical illness as well as blunted bone marrow response from prolonged illness.  He required transfusion once again on 3/18 Plan Continue to trend CBC Transfusion trigger for less than 7  Severe physical deconditioning Plan Continue physical therapy, he will need skilled nursing versus LTAC, hopefully LTAC  Daily Goals Checklist  Pain/Anxiety/Delirium protocol (if  indicated): Precedex VAP protocol (if indicated): bundle in place. Respiratory support goals: Daily SBT as tolerated.  Plan is for tracheostomy Saturday DVT prophylaxis: IV heparin Nutrition: tube feeds, at goal GI prophylaxis: pantoprazole. Urinary catheter: Condom. Central line: PICC Glucose control: SSI - euglycemic  Mobility: PT/OT consulted, advance as tolerated Code Status: full Family Communication:Discussed plan of care in [place] with patient's son Ron Agee at bedside. Disposition: ICU.-->needs LTAC eventually   Erick Colace ACNP-BC [place] Pager # [phone] OR # [phone] if no answer

## 2020-03-12 NOTE — Progress Notes (Signed)
Inpatient Diabetes Program Recommendations  AACE/ADA: New Consensus Statement on Inpatient Glycemic Control (2015)  Target Ranges:  Prepandial:   less than 140 mg/dL      Peak postprandial:   less than 180 mg/dL (1-2 hours)      Critically ill patients:  140 - 180 mg/dL   Lab Results  Component Value Date   GLUCAP 81 03/12/2020   HGBA1C 7.1 (H) 02/16/2020    Review of Glycemic Control Results for SYRIS, BROOKENS (MRN [id number]) as of 03/12/2020 10:02  Ref. Range 03/11/2020 23:32 03/12/2020 03:37 03/12/2020 04:43 03/12/2020 07:52  Glucose-Capillary Latest Ref Range: 70 - 99 mg/dL 90 54 (L) 76 81   Diabetes history: Type 2 DM Outpatient Diabetes medications: none Current orders for Inpatient glycemic control: Lantus 15 units QD, Novolog 0-15 units Q4H Vital on hold for PEG placement D% @50  ml/hr  Inpatient Diabetes Program Recommendations:    Noted hypoglycemia this am of 54 mg/dL, tube feeds have been stopped and basal insulin was held for today.  Looking at trends over the last 24 hours, Would also recommend decreasing Lantus to 10 units QD.   Thanks, , MSN, RNC-OB Diabetes Coordinator [phone] (8a-5p)

## 2020-03-12 NOTE — Procedures (Signed)
Interventional Radiology Procedure Note  Procedure: FLUORO 20 FR PULL THRU GTUBE  Complications: None  Estimated Blood Loss: MIN  Findings: CONFIRMED IN THE [facility]

## 2020-03-12 NOTE — Progress Notes (Signed)
eLink Physician-Brief Progress Note Patient Name: Bruce Webb DOB: 04-23-1941 MRN: [id number]   Date of Service  03/12/2020  HPI/Events of Note  Patient tachypneic and dyssynchronous on the ventilator, sedation interval needs adjustment.  eICU Interventions  PRN Fentanyl order changed to 25-50 mcg Q 1 hour.        Migdalia Dk 03/12/2020, 8:37 PM

## 2020-03-12 NOTE — Care Management (Addendum)
CM requested both [facility] and [facility] to review NP [place] note today and follow up with CM regarding bed offers.  CM has not been able to reach pts son today as of yet.  Update:  Both facilities will still offer a bed when available - both will follow up with CM on Monday as this is the earliest a bed will become available.    CM attempted to communicate with the son however interpretor services for specific language was not available.  Bedside nurse communicated with son at bedside via translator app on his phone - son requested CM to contact pt's grandson Uday.  Uday informed CM that family would prefer [facility] however will accept [facility] if bed becomes available first.

## 2020-03-12 NOTE — Progress Notes (Signed)
eLink Physician-Brief Progress Note Patient Name: Bruce Webb DOB: 1941/03/30 MRN: [id number]   Date of Service  03/12/2020  HPI/Events of Note  Hypoglycemia. Tube feeds are off for procedure later today.   eICU Interventions  Start D5 1/2 NS at 75cc/hr to provide dextrose source and prevent further episodes of hypoglycemia.     Intervention Category Minor Interventions: Other:  Janae Bridgeman 03/12/2020, 5:01 AM

## 2020-03-12 NOTE — Progress Notes (Signed)
SLP Cancellation Note  Patient Details Name: Bruce Webb MRN: [id number] DOB: 1941/05/16   Cancelled treatment:        Pt scheduled for PEG today. Gven his tenuous respiratory status when vent settings are manipulated with RT and activity with PT, will defer attempt at inline PMV today.       Bruce Webb 03/12/2020, 7:57 AM   Bruce Webb.Ed Nurse, children's [phone] Office [phone]

## 2020-03-13 ENCOUNTER — Inpatient Hospital Stay (HOSPITAL_COMMUNITY): Payer: Medicare Other

## 2020-03-13 LAB — POCT I-STAT 7, (LYTES, BLD GAS, ICA,H+H)
Acid-Base Excess: 3 mmol/L — ABNORMAL HIGH (ref 0.0–2.0)
Bicarbonate: 33.3 mmol/L — ABNORMAL HIGH (ref 20.0–28.0)
Calcium, Ion: 1.2 mmol/L (ref 1.15–1.40)
HCT: 30 % — ABNORMAL LOW (ref 39.0–52.0)
Hemoglobin: 10.2 g/dL — ABNORMAL LOW (ref 13.0–17.0)
O2 Saturation: 100 %
Potassium: 3.6 mmol/L (ref 3.5–5.1)
Sodium: 143 mmol/L (ref 135–145)
TCO2: 36 mmol/L — ABNORMAL HIGH (ref 22–32)
pCO2 arterial: 94.1 mmHg (ref 32.0–48.0)
pH, Arterial: 7.157 — CL (ref 7.350–7.450)
pO2, Arterial: 242 mmHg — ABNORMAL HIGH (ref 83.0–108.0)

## 2020-03-13 LAB — GLUCOSE, CAPILLARY
Glucose-Capillary: 103 mg/dL — ABNORMAL HIGH (ref 70–99)
Glucose-Capillary: 128 mg/dL — ABNORMAL HIGH (ref 70–99)
Glucose-Capillary: 86 mg/dL (ref 70–99)
Glucose-Capillary: 91 mg/dL (ref 70–99)
Glucose-Capillary: 94 mg/dL (ref 70–99)
Glucose-Capillary: 96 mg/dL (ref 70–99)
Glucose-Capillary: 98 mg/dL (ref 70–99)

## 2020-03-13 LAB — CBC WITH DIFFERENTIAL/PLATELET
Abs Immature Granulocytes: 0.1 10*3/uL — ABNORMAL HIGH (ref 0.00–0.07)
Basophils Absolute: 0 10*3/uL (ref 0.0–0.1)
Basophils Relative: 0 %
Eosinophils Absolute: 0.1 10*3/uL (ref 0.0–0.5)
Eosinophils Relative: 1 %
HCT: 30.9 % — ABNORMAL LOW (ref 39.0–52.0)
Hemoglobin: 9.4 g/dL — ABNORMAL LOW (ref 13.0–17.0)
Immature Granulocytes: 1 %
Lymphocytes Relative: 6 %
Lymphs Abs: 1 10*3/uL (ref 0.7–4.0)
MCH: 30.9 pg (ref 26.0–34.0)
MCHC: 30.4 g/dL (ref 30.0–36.0)
MCV: 101.6 fL — ABNORMAL HIGH (ref 80.0–100.0)
Monocytes Absolute: 0.7 10*3/uL (ref 0.1–1.0)
Monocytes Relative: 4 %
Neutro Abs: 15.4 10*3/uL — ABNORMAL HIGH (ref 1.7–7.7)
Neutrophils Relative %: 88 %
Platelets: 123 10*3/uL — ABNORMAL LOW (ref 150–400)
RBC: 3.04 MIL/uL — ABNORMAL LOW (ref 4.22–5.81)
RDW: 19.9 % — ABNORMAL HIGH (ref 11.5–15.5)
WBC: 17.3 10*3/uL — ABNORMAL HIGH (ref 4.0–10.5)
nRBC: 0 % (ref 0.0–0.2)

## 2020-03-13 LAB — BASIC METABOLIC PANEL
Anion gap: 9 (ref 5–15)
BUN: 39 mg/dL — ABNORMAL HIGH (ref 8–23)
CO2: 28 mmol/L (ref 22–32)
Calcium: 7.6 mg/dL — ABNORMAL LOW (ref 8.9–10.3)
Chloride: 106 mmol/L (ref 98–111)
Creatinine, Ser: 0.97 mg/dL (ref 0.61–1.24)
GFR calc Af Amer: >60 mL/min (ref >60)
GFR calc non Af Amer: >60 mL/min (ref >60)
Glucose, Bld: 106 mg/dL — ABNORMAL HIGH (ref 70–99)
Potassium: 3.6 mmol/L (ref 3.5–5.1)
Sodium: 143 mmol/L (ref 135–145)

## 2020-03-13 MED ORDER — FREE WATER
350.0000 mL | Status: DC
  Administered 2020-03-13 – 2020-03-14 (×5): 350 mL

## 2020-03-13 MED ORDER — PROPOFOL 1000 MG/100ML IV EMUL
0.0000 ug/kg/min | INTRAVENOUS | Status: DC
  Administered 2020-03-13 – 2020-03-14 (×3): 50 ug/kg/min via INTRAVENOUS
  Filled 2020-03-13 (×4): qty 100

## 2020-03-13 MED ORDER — PROPOFOL 1000 MG/100ML IV EMUL
INTRAVENOUS | Status: AC
  Administered 2020-03-13: 13:00:00 10 ug/kg/min via INTRAVENOUS
  Filled 2020-03-13: qty 100

## 2020-03-13 MED ORDER — ROCURONIUM BROMIDE 50 MG/5ML IV SOLN
50.0000 mg | Freq: Once | INTRAVENOUS | Status: DC
  Filled 2020-03-13: qty 5

## 2020-03-13 MED ORDER — APIXABAN 5 MG PO TABS
5.0000 mg | ORAL_TABLET | Freq: Two times a day (BID) | ORAL | Status: DC
  Administered 2020-03-13 – 2020-03-15 (×4): 5 mg
  Filled 2020-03-13 (×5): qty 1

## 2020-03-13 MED ORDER — FENTANYL 2500MCG IN NS 250ML (10MCG/ML) PREMIX INFUSION
0.0000 ug/h | INTRAVENOUS | Status: DC
  Administered 2020-03-13: 25 ug/h via INTRAVENOUS
  Administered 2020-03-15: 125 ug/h via INTRAVENOUS
  Filled 2020-03-13 (×3): qty 250

## 2020-03-13 NOTE — Progress Notes (Signed)
NAME:  Bruce Webb, MRN:  [id number], DOB:  [birth year], LOS: 6 ADMISSION DATE:  02/15/2020, CONSULTATION DATE:  02/19/2020 REFERRING MD:  Dr. Karleen Hampshire, CHIEF COMPLAINT:  Hypotension/ hypoxia    Brief History   80 year old male post COVID and PE in January on Eliquis admitted 2/21 to [facility] for extertional dyspnea found to be anemic with hemoccult positive stools.  GI consulted and transfused PRBC with since stabilized Hgb but has had persistent higher O2 requirements.  Was diuresed but  developed hypotension, with ongoing higher O2 requirements, PCCM was asked to consult.      Past Medical History  HTN, HFpEF, DMT2, glaucoma with progressive vision loss (left, now rapid loss in right) - recent COVID 01/16/2020 with subsequent PE on Eliquis and O2 2L [place]  Significant Hospital Events   2/21 admit. Stool ob =negatoive 2/23- echo ef 65% 2/25 PCCM consulted for hypotension and persistent hypoxemia 2/26 - Has severe desaturations with activity and eating. Required 10L while eating his meal today. No acute distress but continues to have shortness of breath. Denies chest pain.  2/27 -  -remains on 10 L.  He easily desaturates.  [name] at the bedside.  I spoke to her and subsequently also to their family friend who is in physician assistant in cardiology [place]).  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  According to the daughter he got worse with COVID-19 then got better went home but he declined again in terms of his hypoxemia.  2/28 moved to ICU, intubated. Bronch completed at bedside. Infectious disease consulted for cavitary PNA. Bactrim started for possible PCP 3/3 started on Voriconazole for + aspergillus ag 3/4 not weaning due to agitation 3/9 summary: treating for complicated cavitary PNA felt 2/2 e faecalis and aspergillus ag +  3/12 ID sign off note: stopping Voreconizole. Recommending 10-14 more days augmentin w/ f/u CT chest 2-3 weeks also  recommending MRI of brain at some point to r/o septic embolic disease 6/57 trach  3/16 getting CT abd to eval for PEG 3/17 getting OOB 1x lasix dose. Adding anxiolytic (got freq PRN sed last evening). Adding probiotic and one day of lomotil to see if we can slow down diarrhea. Having sig water loss still. Added urecholine for urinary retention.  3/18 placing foley cath and adjusting urecholine d/t urinary rentention, developed spontaneous right pneumothorax requiring chest tube 3/19 tolerating diuresis Consults:  GI   Procedures:   Significant Diagnostic Tests:  2/21 CTH >> 1. No acute intracranial abnormalities. 2. Mild cerebral atrophy with chronic microvascular ischemic changes in the cerebral white matter, as above.  CTA 01/27/20 - Diffuse subpleural/peripheral interstitial ground glass opacities with increased involvement in the the lower lobes bilaterally R>L. CXR 02/16/20 R>L multifocal pneumonia R>L, no edema or effusion  2/23 TTE >> 1. Left ventricular ejection fraction, by estimation, is 60 to 65%. The left ventricle has normal function. The left ventricle has no regional wall motion abnormalities. Left ventricular diastolic parameters are consistent with Grade II diastolic dysfunction (pseudonormalization).  2. The history indicates the patient has a pulmonary embolus. The RV is  not dilated and there is normal RV function. . Right ventricular systolic function is low normal. The right ventricular size is normal.  3. The mitral valve is normal in structure and function. Mild mitral valve regurgitation. No evidence of mitral stenosis.  4. The aortic valve is normal in structure and function. Aortic valve regurgitation  is not visualized. No aortic stenosis is present.  CT abd 3/17 (for PEG):1. Marked distention of the bladder with secondary slight dilatation of the renal collecting systems bilaterally. 2. Diffuse anasarca.  No ascites. 3. Small bilateral pleural effusions, left  greater than right. 4. Extensive bilateral pulmonary infiltrates, unchanged.  Micro Data:  2/21 BCx 2 >> ngtd x 4 days 2/25 - CCP 2/27 - quant gold -  2/27 RVP - negative 2/28  - urine strep - negative 228 - urine leg -  2/28 - aspergillus Ab -  xxxxxxxxxxxx 3/1 BAL Cx, fungal, acid fast >> negative 3/1 BAL PCP>> negative 3/ blood cultures - ngtd 3/4 enterococcus Faecalis; few candida lusitaniana   Antimicrobials:  2/21 azithro>>2/26 2/21 ceftriaxone ->off 2/22 cefepime >>off 2/28 Bactrim >3/4 3/1 Flagyl>31f 3/2 Voriconazole >>3/12 3/4 Zyvox->off 3/12: augmentin (this was after 8 days of unasyn)  Interim history/subjective:  Remains on pressors, full vent support.  Objective   Blood pressure (!) 101/54, pulse 73, temperature (!) 96.4 F (35.8 C), temperature source Axillary, resp. rate (!) 26, height _0  (1.575 m), weight 65.4 kg, SpO2 99 %.    Vent Mode: PCV FiO2 (%):  [40 %-100 %] 50 % Set Rate:  [28 bmp] 28 bmp Vt Set:  [440 mL] 440 mL PEEP:  [5 cmH20] 5 cmH20 Plateau Pressure:  [29 cmH20-33 cmH20] 29 cmH20   Intake/Output Summary (Last 24 hours) at 03/13/2020 1208 Last data filed at 03/13/2020 1100 Gross per 24 hour  Intake 3186.03 ml  Output 3325 ml  Net -138.97 ml   Filed Weights   03/09/20 0500 03/12/20 0238 03/13/20 0328  Weight: 67.6 kg 68 kg 65.4 kg   Physical Exam:  General - sedated Eyes - pupils reactive ENT - trach site clean Cardiac - regular rate/rhythm, no murmur Chest - b/l crackles Abdomen - soft, non tender, + bowel sounds, G tube in place Extremities - decreased muscle bulk, 2+ edema Skin - no rashes Neuro - not following commands   Resolved Hospital Problem list   Elevated LFTs, Acute blood loss anemia, AKI, Hyperkalemia, Urine retention  Assessment & Plan:   Acute on chronic hypoxic respiratory failure from Cavitary pneumonia after hospitalization in February 2021 for 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 Failure to wean s/p tracheostomy. - will need LTAC - continue ABx for Enterococcus HCAP until 3/21 - resume eliquis 3/20  Spontaneous Rt PTX 03/11/20. - chest tube to -20 suction - f/u CXR  Septic shock from HCAP. - pressors to keep MAP > 65 - continue midodrine  Hypernatremia. - improving - continue free water - f/u BMET  Acute metabolic encephalopathy 2nd to sepsis. - RASS goal 0 to -1 - add diprivan 3/20  DM type 2 poorly controlled with hyperglycemia. - SSI with lantus  Vision loss, glaucoma. - eye drops  Anemia of critical illness. - f/u CBC - transfuse for Hb < 7 or significant bleeding  Severe physical deconditioning - will need LTAC  Goals of care. - had lengthy d/w pt's grandson on 3/20 >> explained concern he will not be able to recover to the point of returning home - family would like to continue aggressive measures, including LTAC placement when able   Daily Goals Checklist  DVT prophylaxis: eliquis Nutrition: tube feeds GI prophylaxis: pantoprazole Mobility: will need LTAC Code Status: full Disposition: ICU  Labs:   CMP Latest Ref Rng & Units 03/13/2020 03/12/2020 03/11/2020  Glucose 70 - 99 mg/dL 106(H) 67(L) 162(H)  BUN 8 - 23 mg/dL 39(H) 58(H) 76(H)  Creatinine 0.61 - 1.24 mg/dL 0.97 1.05 1.27(H)  Sodium 135 - 145 mmol/L 143 148(H) 148(H)  Potassium 3.5 - 5.1 mmol/L 3.6 4.2 4.5  Chloride 98 - 111 mmol/L 106 112(H) 116(H)  CO2 22 - 32 mmol/L _0 Calcium 8.9 - 10.3 mg/dL 7.6(L) 7.4(L) 7.0(L)  Total Protein 6.5 - 8.1 g/dL - - -  Total Bilirubin 0.3 - 1.2 mg/dL - - -  Alkaline Phos 38 - 126 U/L - - -  AST 15 - 41 U/L - - -  ALT 0 - 44 U/L - - -    CBC Latest Ref Rng & Units 03/13/2020 03/12/2020 03/11/2020  WBC 4.0 - 10.5 K/uL 17.3(H) 20.3(H) 19.0(H)  Hemoglobin 13.0 - 17.0 g/dL 9.4(L) 9.4(L) 9.1(L)  Hematocrit 39.0 - 52.0 % 30.9(L) 31.3(L) 30.6(L)  Platelets 150 - 400 K/uL 123(L) 128(L) 137(L)    ABG    Component Value  Date/Time   PHART 7.361 02/29/2020 1140   PCO2ART 34.1 02/29/2020 1140   PO2ART 54.0 (L) 02/29/2020 1140   HCO3 19.3 (L) 02/29/2020 1140   TCO2 20 (L) 02/29/2020 1140   ACIDBASEDEF 6.0 (H) 02/29/2020 1140   O2SAT 87.0 02/29/2020 1140    CBG (last 3)  Recent Labs    03/13/20 0313 03/13/20 0748 03/13/20 1159  GLUCAP 86 98 103*    CC time 34 minutes  Chesley Mires, MD [place] 03/13/2020, 12:19 PM

## 2020-03-13 NOTE — Progress Notes (Signed)
ABG results given to Dr. Everardo All. Verbal order received to place pt on VT. Fio2 decreased to 50% per ABG results. Dr. Everardo All aware of peak pressures 42-46 with plateaus 39-41. RT will continue to monitor.

## 2020-03-14 ENCOUNTER — Inpatient Hospital Stay (HOSPITAL_COMMUNITY): Payer: Medicare Other

## 2020-03-14 DIAGNOSIS — J9602 Acute respiratory failure with hypercapnia: Secondary | ICD-10-CM

## 2020-03-14 LAB — BASIC METABOLIC PANEL
Anion gap: 5 (ref 5–15)
BUN: 34 mg/dL — ABNORMAL HIGH (ref 8–23)
CO2: 29 mmol/L (ref 22–32)
Calcium: 7.3 mg/dL — ABNORMAL LOW (ref 8.9–10.3)
Chloride: 106 mmol/L (ref 98–111)
Creatinine, Ser: 0.79 mg/dL (ref 0.61–1.24)
GFR calc Af Amer: >60 mL/min (ref >60)
GFR calc non Af Amer: >60 mL/min (ref >60)
Glucose, Bld: 155 mg/dL — ABNORMAL HIGH (ref 70–99)
Potassium: 3.4 mmol/L — ABNORMAL LOW (ref 3.5–5.1)
Sodium: 140 mmol/L (ref 135–145)

## 2020-03-14 LAB — CULTURE, FUNGUS WITHOUT SMEAR

## 2020-03-14 LAB — CBC
HCT: 31 % — ABNORMAL LOW (ref 39.0–52.0)
Hemoglobin: 9.1 g/dL — ABNORMAL LOW (ref 13.0–17.0)
MCH: 30.3 pg (ref 26.0–34.0)
MCHC: 29.4 g/dL — ABNORMAL LOW (ref 30.0–36.0)
MCV: 103.3 fL — ABNORMAL HIGH (ref 80.0–100.0)
Platelets: 139 10*3/uL — ABNORMAL LOW (ref 150–400)
RBC: 3 MIL/uL — ABNORMAL LOW (ref 4.22–5.81)
RDW: 18.9 % — ABNORMAL HIGH (ref 11.5–15.5)
WBC: 17.2 10*3/uL — ABNORMAL HIGH (ref 4.0–10.5)
nRBC: 0.1 % (ref 0.0–0.2)

## 2020-03-14 LAB — GLUCOSE, CAPILLARY
Glucose-Capillary: 140 mg/dL — ABNORMAL HIGH (ref 70–99)
Glucose-Capillary: 172 mg/dL — ABNORMAL HIGH (ref 70–99)
Glucose-Capillary: 160 mg/dL — ABNORMAL HIGH (ref 70–99)
Glucose-Capillary: 128 mg/dL — ABNORMAL HIGH (ref 70–99)
Glucose-Capillary: 160 mg/dL — ABNORMAL HIGH (ref 70–99)

## 2020-03-14 LAB — POCT I-STAT 7, (LYTES, BLD GAS, ICA,H+H)
Acid-Base Excess: 1 mmol/L (ref 0.0–2.0)
Bicarbonate: 29.7 mmol/L — ABNORMAL HIGH (ref 20.0–28.0)
Calcium, Ion: 1.19 mmol/L (ref 1.15–1.40)
HCT: 28 % — ABNORMAL LOW (ref 39.0–52.0)
Hemoglobin: 9.5 g/dL — ABNORMAL LOW (ref 13.0–17.0)
O2 Saturation: 76 %
Potassium: 3.6 mmol/L (ref 3.5–5.1)
Sodium: 142 mmol/L (ref 135–145)
TCO2: 32 mmol/L (ref 22–32)
pCO2 arterial: 71.9 mmHg (ref 32.0–48.0)
pH, Arterial: 7.225 — ABNORMAL LOW (ref 7.350–7.450)
pO2, Arterial: 50 mmHg — ABNORMAL LOW (ref 83.0–108.0)

## 2020-03-14 LAB — TRIGLYCERIDES: Triglycerides: 233 mg/dL — ABNORMAL HIGH (ref <150)

## 2020-03-14 MED ORDER — POTASSIUM CHLORIDE 20 MEQ/15ML (10%) PO SOLN
20.0000 meq | ORAL | Status: AC
  Administered 2020-03-14 (×2): 20 meq
  Filled 2020-03-14 (×2): qty 15

## 2020-03-14 MED ORDER — FREE WATER
200.0000 mL | Freq: Four times a day (QID) | Status: DC
  Administered 2020-03-14 – 2020-03-15 (×4): 200 mL

## 2020-03-14 MED ORDER — MIDAZOLAM 50MG/50ML (1MG/ML) PREMIX INFUSION
0.5000 mg/h | INTRAVENOUS | Status: DC
  Administered 2020-03-14: 06:00:00 0.5 mg/h via INTRAVENOUS
  Administered 2020-03-15: 01:00:00 1 mg/h via INTRAVENOUS
  Filled 2020-03-14 (×2): qty 50

## 2020-03-14 NOTE — Progress Notes (Signed)
ABG drawn, critical results given to E-link RN.   

## 2020-03-14 NOTE — Progress Notes (Deleted)
eLink Physician-Brief Progress Note Patient Name: Bruce Webb DOB: 1941/07/29 MRN: [id number]   Date of Service  03/14/2020  HPI/Events of Note  7.22/ 71.9/ 50/ 29.7  eICU Interventions  FIO2 increased to 70 %, PEEP increased to 10, RR increased to 35, ABG at 6 a.m.        [name] 03/14/2020, 4:44 AM

## 2020-03-14 NOTE — Progress Notes (Signed)
[facility] ADULT ICU REPLACEMENT PROTOCOL FOR AM LAB REPLACEMENT ONLY  The patient does apply for the [facility] Adult ICU Electrolyte Replacment Protocol based on the criteria listed below:   1. Is GFR >/= 40 ml/min? Yes.    Patient's GFR today is >60 2. Is urine output >/= 0.5 ml/kg/hr for the last 6 hours? Yes.   Patient's UOP is 0.8 ml/kg/hr 3. Is BUN < 60 mg/dL? Yes.    Patient's BUN today is 34 4. Abnormal electrolyte(s): k 3.4 5. Ordered repletion with: protocol per tube 6. If a panic level lab has been reported, has the CCM MD in charge been notified? No..   Physician:    Markus Daft A 03/14/2020 5:36 AM

## 2020-03-14 NOTE — Progress Notes (Signed)
eLink Physician-Brief Progress Note Patient Name: Bruce Webb DOB: 01/18/1941 MRN: [id number]   Date of Service  03/14/2020  HPI/Events of Note  Pt with hypertriglyceridemia, Precedex caused severe bradycardia.  eICU Interventions  Propofol discontinued, Versed substituted.        Bruce Webb Bruce Webb 03/14/2020, 6:14 AM

## 2020-03-14 NOTE — Progress Notes (Signed)
NAME:  Bruce Webb, MRN:  [id number], DOB:  09/30/1941, LOS: 39 ADMISSION DATE:  02/15/2020, CONSULTATION DATE:  02/19/2020 REFERRING MD:  Dr. Karleen Hampshire, CHIEF COMPLAINT:  Hypotension/ hypoxia    Brief History   79 year old male post COVID and PE in January on Eliquis admitted 2/21 to [facility] for extertional dyspnea found to be anemic with hemoccult positive stools.  GI consulted and transfused PRBC with since stabilized Hgb but has had persistent higher O2 requirements.  Was diuresed but  developed hypotension, with ongoing higher O2 requirements, PCCM was asked to consult.      Past Medical History  HTN, HFpEF, DMT2, glaucoma with progressive vision loss (left, now rapid loss in right) - recent COVID 01/16/2020 with subsequent PE on Eliquis and O2 2L [place]  Significant Hospital Events   2/21 admit. Stool ob =negatoive 2/23- echo ef 65% 2/25 PCCM consulted for hypotension and persistent hypoxemia 2/26 - Has severe desaturations with activity and eating. Required 10L while eating his meal today. No acute distress but continues to have shortness of breath. Denies chest pain.  2/27 -  -remains on 10 L.  He easily desaturates.  [name] at the bedside.  I spoke to her and subsequently also to their family friend who is in physician assistant in cardiology [place]).  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  According to the daughter he got worse with COVID-19 then got better went home but he declined again in terms of his hypoxemia.  2/28 moved to ICU, intubated. Bronch completed at bedside. Infectious disease consulted for cavitary PNA. Bactrim started for possible PCP 3/3 started on Voriconazole for + aspergillus ag 3/4 not weaning due to agitation 3/9 summary: treating for complicated cavitary PNA felt 2/2 e faecalis and aspergillus ag +  3/12 ID sign off note: stopping Voreconizole. Recommending 10-14 more days augmentin w/ f/u CT chest 2-3 weeks also  recommending MRI of brain at some point to r/o septic embolic disease 1/79 trach  3/16 getting CT abd to eval for PEG 3/17 getting OOB 1x lasix dose. Adding anxiolytic (got freq PRN sed last evening). Adding probiotic and one day of lomotil to see if we can slow down diarrhea. Having sig water loss still. Added urecholine for urinary retention.  3/18 placing foley cath and adjusting urecholine d/t urinary rentention, developed spontaneous right pneumothorax requiring chest tube 3/19 tolerating diuresis  Consults:  GI   Procedures:   Significant Diagnostic Tests:  2/21 CTH >> 1. No acute intracranial abnormalities. 2. Mild cerebral atrophy with chronic microvascular ischemic changes in the cerebral white matter, as above.  CTA 01/27/20 - Diffuse subpleural/peripheral interstitial ground glass opacities with increased involvement in the the lower lobes bilaterally R>L. CXR 02/16/20 R>L multifocal pneumonia R>L, no edema or effusion  2/23 TTE >> 1. Left ventricular ejection fraction, by estimation, is 60 to 65%. The left ventricle has normal function. The left ventricle has no regional wall motion abnormalities. Left ventricular diastolic parameters are consistent with Grade II diastolic dysfunction (pseudonormalization).  2. The history indicates the patient has a pulmonary embolus. The RV is  not dilated and there is normal RV function. . Right ventricular systolic function is low normal. The right ventricular size is normal.  3. The mitral valve is normal in structure and function. Mild mitral valve regurgitation. No evidence of mitral stenosis.  4. The aortic valve is normal in structure and function. Aortic valve  regurgitation is not visualized. No aortic stenosis is present.  CT abd 3/17 (for PEG):1. Marked distention of the bladder with secondary slight dilatation of the renal collecting systems bilaterally. 2. Diffuse anasarca.  No ascites. 3. Small bilateral pleural effusions,  left greater than right. 4. Extensive bilateral pulmonary infiltrates, unchanged.  Micro Data:  2/21 BCx 2 >> ngtd x 4 days 2/25 - CCP 2/27 - quant gold -  2/27 RVP - negative 2/28  - urine strep - negative 228 - urine leg -  2/28 - aspergillus Ab -  xxxxxxxxxxxx 3/1 BAL Cx, fungal, acid fast >> negative 3/1 BAL PCP>> negative 3/ blood cultures - ngtd 3/4 enterococcus Faecalis; few candida lusitaniana   Antimicrobials:  2/21 azithro>>2/26 2/21 ceftriaxone ->off 2/22 cefepime >>off 2/28 Bactrim >3/4 3/1 Flagyl>62f 3/2 Voriconazole >>3/12 3/4 Zyvox->off 3/12: augmentin (this was after 8 days of unasyn)  Interim history/subjective:  More acidotic on ABG.  Remains on pressors, vent support.  Changed to versed overnight due to elevated triglycerides.  Objective   Blood pressure (!) 119/52, pulse (!) 104, temperature 98.1 F (36.7 C), temperature source Axillary, resp. rate (!) 29, height _0  (1.575 m), weight 66.7 kg, SpO2 98 %.    Vent Mode: PRVC FiO2 (%):  [40 %-100 %] 70 % Set Rate:  [24 bmp-28 bmp] 24 bmp Vt Set:  [370 mL-440 mL] 400 mL PEEP:  [5 cmH20] 5 cmH20 Plateau Pressure:  [28 cmH20-39 cmH20] 29 cmH20   Intake/Output Summary (Last 24 hours) at 03/14/2020 1036 Last data filed at 03/14/2020 1000 Gross per 24 hour  Intake 4137.04 ml  Output 1965 ml  Net 2172.04 ml   Filed Weights   03/12/20 0238 03/13/20 0328 03/14/20 0439  Weight: 68 kg 65.4 kg 66.7 kg   Physical Exam:  General - sedated Eyes - pupils reactive ENT - trach site clean Cardiac - regular, tachycardic Chest - b/l crackles Abdomen - soft, non tender, + bowel sounds Extremities - 2+ edema Skin - no rashes Neuro - RASS - 3   Resolved Hospital Problem list   Elevated LFTs, Acute blood loss anemia, AKI, Hyperkalemia, Urine retention, Hypernatremia  Assessment & Plan:   Acute on chronic hypoxic/hypercapnic respiratory failure from Cavitary pneumonia after hospitalization in February 2021  for COVID 19 pneumonia, aspergillosis and pulmonary embolism. Failure to wean s/p tracheostomy. - increase RR to 30 - continue PRVC - d/c ABx for HCAP after dose on 3/21 - continue eliquis - will need LTAC >> family is agreeable  Spontaneous Rt PTX 03/11/20. - f/u CXR  Septic shock from HCAP. - pressors to keep MAP > 65 - continue midodrine  Acute metabolic encephalopathy 2nd to sepsis. 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 - SSI with lantus  Vision loss, glaucoma. - eye drops  Anemia of critical illness. - f/u CBC - transfuse for Hb < 7 or significant bleeding  Severe physical deconditioning - will need LTAC  Goals of care. - had lengthy d/w pt's grandson on 3/20 >> explained concern he will not be able to recover to the point of returning home - family would like to continue aggressive measures, including LTAC placement when able   Daily Goals Checklist  DVT prophylaxis: eliquis Nutrition: tube feeds GI prophylaxis: pantoprazole Mobility: will need LTAC Code Status: full Disposition: ICU  Labs:   CMP Latest Ref Rng & Units 03/14/2020  03/14/2020 03/13/2020  Glucose 70 - 99 mg/dL - 155(H) -  BUN 8 - 23 mg/dL - 34(H) -  Creatinine 0.61 - 1.24 mg/dL - 0.79 -  Sodium 135 - 145 mmol/L 142 140 143  Potassium 3.5 - 5.1 mmol/L 3.6 3.4(L) 3.6  Chloride 98 - 111 mmol/L - 106 -  CO2 22 - 32 mmol/L - 29 -  Calcium 8.9 - 10.3 mg/dL - 7.3(L) -  Total Protein 6.5 - 8.1 g/dL - - -  Total Bilirubin 0.3 - 1.2 mg/dL - - -  Alkaline Phos 38 - 126 U/L - - -  AST 15 - 41 U/L - - -  ALT 0 - 44 U/L - - -    CBC Latest Ref Rng & Units 03/14/2020 03/14/2020 03/13/2020  WBC 4.0 - 10.5 K/uL - 17.2(H) -  Hemoglobin 13.0 - 17.0 g/dL 9.5(L) 9.1(L) 10.2(L)  Hematocrit 39.0 - 52.0 % 28.0(L) 31.0(L) 30.0(L)  Platelets 150 - 400 K/uL - 139(L) -    ABG     Component Value Date/Time   PHART 7.225 (L) 03/14/2020 0341   PCO2ART 71.9 (HH) 03/14/2020 0341   PO2ART 50.0 (L) 03/14/2020 0341   HCO3 29.7 (H) 03/14/2020 0341   TCO2 32 03/14/2020 0341   ACIDBASEDEF 6.0 (H) 02/29/2020 1140   O2SAT 76.0 03/14/2020 0341    CBG (last 3)  Recent Labs    03/13/20 2349 03/14/20 0403 03/14/20 0743  GLUCAP 128* 140* 172*    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

## 2020-03-15 ENCOUNTER — Inpatient Hospital Stay (HOSPITAL_COMMUNITY): Payer: Medicare Other

## 2020-03-15 DIAGNOSIS — D65 Disseminated intravascular coagulation [defibrination syndrome]: Secondary | ICD-10-CM | POA: Diagnosis not present

## 2020-03-15 DIAGNOSIS — I959 Hypotension, unspecified: Secondary | ICD-10-CM

## 2020-03-15 DIAGNOSIS — R2689 Other abnormalities of gait and mobility: Secondary | ICD-10-CM | POA: Diagnosis not present

## 2020-03-15 DIAGNOSIS — J398 Other specified diseases of upper respiratory tract: Secondary | ICD-10-CM | POA: Diagnosis not present

## 2020-03-15 DIAGNOSIS — R4189 Other symptoms and signs involving cognitive functions and awareness: Secondary | ICD-10-CM | POA: Diagnosis not present

## 2020-03-15 DIAGNOSIS — E1129 Type 2 diabetes mellitus with other diabetic kidney complication: Secondary | ICD-10-CM | POA: Diagnosis not present

## 2020-03-15 DIAGNOSIS — R451 Restlessness and agitation: Secondary | ICD-10-CM | POA: Diagnosis present

## 2020-03-15 DIAGNOSIS — J850 Gangrene and necrosis of lung: Secondary | ICD-10-CM | POA: Diagnosis not present

## 2020-03-15 DIAGNOSIS — R6521 Severe sepsis with septic shock: Secondary | ICD-10-CM | POA: Diagnosis present

## 2020-03-15 DIAGNOSIS — D638 Anemia in other chronic diseases classified elsewhere: Secondary | ICD-10-CM | POA: Diagnosis present

## 2020-03-15 DIAGNOSIS — K9423 Gastrostomy malfunction: Secondary | ICD-10-CM | POA: Diagnosis not present

## 2020-03-15 DIAGNOSIS — Z9911 Dependence on respirator [ventilator] status: Secondary | ICD-10-CM

## 2020-03-15 DIAGNOSIS — G8911 Acute pain due to trauma: Secondary | ICD-10-CM | POA: Diagnosis not present

## 2020-03-15 DIAGNOSIS — J9311 Primary spontaneous pneumothorax: Secondary | ICD-10-CM | POA: Diagnosis present

## 2020-03-15 DIAGNOSIS — E874 Mixed disorder of acid-base balance: Secondary | ICD-10-CM | POA: Diagnosis not present

## 2020-03-15 DIAGNOSIS — E118 Type 2 diabetes mellitus with unspecified complications: Secondary | ICD-10-CM | POA: Diagnosis not present

## 2020-03-15 DIAGNOSIS — J939 Pneumothorax, unspecified: Secondary | ICD-10-CM | POA: Diagnosis present

## 2020-03-15 DIAGNOSIS — R64 Cachexia: Secondary | ICD-10-CM | POA: Diagnosis not present

## 2020-03-15 DIAGNOSIS — U071 COVID-19: Secondary | ICD-10-CM | POA: Diagnosis not present

## 2020-03-15 DIAGNOSIS — E871 Hypo-osmolality and hyponatremia: Secondary | ICD-10-CM | POA: Diagnosis not present

## 2020-03-15 DIAGNOSIS — G894 Chronic pain syndrome: Secondary | ICD-10-CM | POA: Diagnosis present

## 2020-03-15 DIAGNOSIS — B449 Aspergillosis, unspecified: Secondary | ICD-10-CM | POA: Diagnosis not present

## 2020-03-15 DIAGNOSIS — Z431 Encounter for attention to gastrostomy: Secondary | ICD-10-CM | POA: Diagnosis not present

## 2020-03-15 DIAGNOSIS — J1282 Pneumonia due to coronavirus disease 2019: Secondary | ICD-10-CM | POA: Diagnosis not present

## 2020-03-15 DIAGNOSIS — D72829 Elevated white blood cell count, unspecified: Secondary | ICD-10-CM | POA: Diagnosis not present

## 2020-03-15 DIAGNOSIS — I2699 Other pulmonary embolism without acute cor pulmonale: Secondary | ICD-10-CM | POA: Diagnosis present

## 2020-03-15 DIAGNOSIS — I2694 Multiple subsegmental pulmonary emboli without acute cor pulmonale: Secondary | ICD-10-CM | POA: Diagnosis not present

## 2020-03-15 DIAGNOSIS — Z430 Encounter for attention to tracheostomy: Secondary | ICD-10-CM | POA: Diagnosis not present

## 2020-03-15 DIAGNOSIS — E119 Type 2 diabetes mellitus without complications: Secondary | ICD-10-CM | POA: Diagnosis not present

## 2020-03-15 DIAGNOSIS — I2782 Chronic pulmonary embolism: Secondary | ICD-10-CM | POA: Diagnosis not present

## 2020-03-15 DIAGNOSIS — I5033 Acute on chronic diastolic (congestive) heart failure: Secondary | ICD-10-CM | POA: Diagnosis not present

## 2020-03-15 DIAGNOSIS — E1165 Type 2 diabetes mellitus with hyperglycemia: Secondary | ICD-10-CM | POA: Diagnosis present

## 2020-03-15 DIAGNOSIS — G9341 Metabolic encephalopathy: Secondary | ICD-10-CM | POA: Diagnosis present

## 2020-03-15 DIAGNOSIS — E870 Hyperosmolality and hypernatremia: Secondary | ICD-10-CM | POA: Diagnosis not present

## 2020-03-15 DIAGNOSIS — A419 Sepsis, unspecified organism: Secondary | ICD-10-CM | POA: Diagnosis not present

## 2020-03-15 DIAGNOSIS — J158 Pneumonia due to other specified bacteria: Secondary | ICD-10-CM | POA: Diagnosis present

## 2020-03-15 DIAGNOSIS — F419 Anxiety disorder, unspecified: Secondary | ICD-10-CM | POA: Diagnosis not present

## 2020-03-15 DIAGNOSIS — J189 Pneumonia, unspecified organism: Secondary | ICD-10-CM | POA: Diagnosis not present

## 2020-03-15 DIAGNOSIS — R5381 Other malaise: Secondary | ICD-10-CM | POA: Diagnosis present

## 2020-03-15 DIAGNOSIS — J9690 Respiratory failure, unspecified, unspecified whether with hypoxia or hypercapnia: Secondary | ICD-10-CM | POA: Diagnosis not present

## 2020-03-15 DIAGNOSIS — M6281 Muscle weakness (generalized): Secondary | ICD-10-CM | POA: Diagnosis not present

## 2020-03-15 DIAGNOSIS — D649 Anemia, unspecified: Secondary | ICD-10-CM | POA: Diagnosis not present

## 2020-03-15 DIAGNOSIS — E872 Acidosis: Secondary | ICD-10-CM | POA: Diagnosis not present

## 2020-03-15 DIAGNOSIS — J9621 Acute and chronic respiratory failure with hypoxia: Secondary | ICD-10-CM | POA: Diagnosis present

## 2020-03-15 DIAGNOSIS — D62 Acute posthemorrhagic anemia: Secondary | ICD-10-CM | POA: Diagnosis not present

## 2020-03-15 DIAGNOSIS — K922 Gastrointestinal hemorrhage, unspecified: Secondary | ICD-10-CM | POA: Diagnosis not present

## 2020-03-15 DIAGNOSIS — G9340 Encephalopathy, unspecified: Secondary | ICD-10-CM | POA: Diagnosis present

## 2020-03-15 DIAGNOSIS — J159 Unspecified bacterial pneumonia: Secondary | ICD-10-CM | POA: Diagnosis not present

## 2020-03-15 DIAGNOSIS — J80 Acute respiratory distress syndrome: Secondary | ICD-10-CM | POA: Diagnosis not present

## 2020-03-15 DIAGNOSIS — J9601 Acute respiratory failure with hypoxia: Secondary | ICD-10-CM | POA: Diagnosis not present

## 2020-03-15 DIAGNOSIS — J984 Other disorders of lung: Secondary | ICD-10-CM | POA: Diagnosis not present

## 2020-03-15 DIAGNOSIS — R0682 Tachypnea, not elsewhere classified: Secondary | ICD-10-CM | POA: Diagnosis not present

## 2020-03-15 DIAGNOSIS — Y95 Nosocomial condition: Secondary | ICD-10-CM | POA: Diagnosis not present

## 2020-03-15 DIAGNOSIS — R918 Other nonspecific abnormal finding of lung field: Secondary | ICD-10-CM | POA: Diagnosis not present

## 2020-03-15 DIAGNOSIS — Z452 Encounter for adjustment and management of vascular access device: Secondary | ICD-10-CM | POA: Diagnosis not present

## 2020-03-15 DIAGNOSIS — J9622 Acute and chronic respiratory failure with hypercapnia: Secondary | ICD-10-CM | POA: Diagnosis present

## 2020-03-15 DIAGNOSIS — K921 Melena: Secondary | ICD-10-CM | POA: Diagnosis not present

## 2020-03-15 DIAGNOSIS — N179 Acute kidney failure, unspecified: Secondary | ICD-10-CM | POA: Diagnosis not present

## 2020-03-15 DIAGNOSIS — J90 Pleural effusion, not elsewhere classified: Secondary | ICD-10-CM | POA: Diagnosis not present

## 2020-03-15 DIAGNOSIS — H401134 Primary open-angle glaucoma, bilateral, indeterminate stage: Secondary | ICD-10-CM | POA: Diagnosis not present

## 2020-03-15 DIAGNOSIS — I110 Hypertensive heart disease with heart failure: Secondary | ICD-10-CM | POA: Diagnosis not present

## 2020-03-15 DIAGNOSIS — E43 Unspecified severe protein-calorie malnutrition: Secondary | ICD-10-CM | POA: Diagnosis not present

## 2020-03-15 DIAGNOSIS — R1310 Dysphagia, unspecified: Secondary | ICD-10-CM | POA: Diagnosis present

## 2020-03-15 DIAGNOSIS — A4181 Sepsis due to Enterococcus: Secondary | ICD-10-CM | POA: Diagnosis present

## 2020-03-15 DIAGNOSIS — B948 Sequelae of other specified infectious and parasitic diseases: Secondary | ICD-10-CM | POA: Diagnosis not present

## 2020-03-15 DIAGNOSIS — R4182 Altered mental status, unspecified: Secondary | ICD-10-CM | POA: Diagnosis not present

## 2020-03-15 DIAGNOSIS — H409 Unspecified glaucoma: Secondary | ICD-10-CM | POA: Diagnosis present

## 2020-03-15 DIAGNOSIS — J851 Abscess of lung with pneumonia: Secondary | ICD-10-CM | POA: Diagnosis not present

## 2020-03-15 DIAGNOSIS — Z8616 Personal history of COVID-19: Secondary | ICD-10-CM | POA: Diagnosis not present

## 2020-03-15 LAB — BASIC METABOLIC PANEL
Anion gap: 7 (ref 5–15)
BUN: 36 mg/dL — ABNORMAL HIGH (ref 8–23)
CO2: 29 mmol/L (ref 22–32)
Calcium: 7.3 mg/dL — ABNORMAL LOW (ref 8.9–10.3)
Chloride: 104 mmol/L (ref 98–111)
Creatinine, Ser: 0.8 mg/dL (ref 0.61–1.24)
GFR calc Af Amer: >60 mL/min (ref >60)
GFR calc non Af Amer: >60 mL/min (ref >60)
Glucose, Bld: 193 mg/dL — ABNORMAL HIGH (ref 70–99)
Potassium: 4.3 mmol/L (ref 3.5–5.1)
Sodium: 140 mmol/L (ref 135–145)

## 2020-03-15 LAB — CBC
HCT: 30.6 % — ABNORMAL LOW (ref 39.0–52.0)
Hemoglobin: 8.8 g/dL — ABNORMAL LOW (ref 13.0–17.0)
MCH: 30.4 pg (ref 26.0–34.0)
MCHC: 28.8 g/dL — ABNORMAL LOW (ref 30.0–36.0)
MCV: 105.9 fL — ABNORMAL HIGH (ref 80.0–100.0)
Platelets: 125 10*3/uL — ABNORMAL LOW (ref 150–400)
RBC: 2.89 MIL/uL — ABNORMAL LOW (ref 4.22–5.81)
RDW: 18.2 % — ABNORMAL HIGH (ref 11.5–15.5)
WBC: 12.1 10*3/uL — ABNORMAL HIGH (ref 4.0–10.5)
nRBC: 0.2 % (ref 0.0–0.2)

## 2020-03-15 LAB — GLUCOSE, CAPILLARY
Glucose-Capillary: 140 mg/dL — ABNORMAL HIGH (ref 70–99)
Glucose-Capillary: 156 mg/dL — ABNORMAL HIGH (ref 70–99)
Glucose-Capillary: 169 mg/dL — ABNORMAL HIGH (ref 70–99)
Glucose-Capillary: 170 mg/dL — ABNORMAL HIGH (ref 70–99)
Glucose-Capillary: 198 mg/dL — ABNORMAL HIGH (ref 70–99)

## 2020-03-15 MED ORDER — LATANOPROST 0.005 % OP SOLN: 1.0000 [drp] | Freq: Every day | OPHTHALMIC | 12 refills | Status: AC

## 2020-03-15 MED ORDER — DORZOLAMIDE HCL 2 % OP SOLN: 1.0000 [drp] | Freq: Two times a day (BID) | OPHTHALMIC | 12 refills | Status: AC

## 2020-03-15 MED ORDER — APIXABAN 5 MG PO TABS: 5.0000 mg | ORAL_TABLET | Freq: Two times a day (BID) | ORAL | Status: AC

## 2020-03-15 MED ORDER — CHLORHEXIDINE GLUCONATE 0.12% ORAL RINSE (MEDLINE KIT): 15.0000 mL | Freq: Two times a day (BID) | OROMUCOSAL | 0 refills | Status: AC

## 2020-03-15 MED ORDER — NOREPINEPHRINE 4 MG/250ML-% IV SOLN: 0.0000 ug/min | INTRAVENOUS | Status: AC

## 2020-03-15 MED ORDER — FENTANYL 2500MCG IN NS 250ML (10MCG/ML) PREMIX INFUSION: 0.0000 ug/h | INTRAVENOUS | Status: AC

## 2020-03-15 MED ORDER — ORAL CARE MOUTH RINSE: 15.0000 mL | OROMUCOSAL | 0 refills | Status: AC | PRN

## 2020-03-15 MED ORDER — INSULIN ASPART 100 UNIT/ML ~~LOC~~ SOLN: 0.0000 [IU] | SUBCUTANEOUS | 11 refills | Status: AC

## 2020-03-15 MED ORDER — FUROSEMIDE 10 MG/ML IJ SOLN
40.0000 mg | Freq: Once | INTRAMUSCULAR | Status: AC
  Administered 2020-03-15: 40 mg via INTRAVENOUS
  Filled 2020-03-15: qty 4

## 2020-03-15 MED ORDER — BRIMONIDINE TARTRATE 0.2 % OP SOLN: 1.0000 [drp] | Freq: Two times a day (BID) | OPHTHALMIC | 12 refills | Status: AC

## 2020-03-15 MED ORDER — AMOXICILLIN-POT CLAVULANATE 875-125 MG PO TABS: 1.0000 | ORAL_TABLET | Freq: Two times a day (BID) | ORAL | 0 refills | Status: AC

## 2020-03-15 MED ORDER — PANTOPRAZOLE SODIUM 40 MG PO PACK: 40.0000 mg | PACK | Freq: Every day | ORAL | Status: AC

## 2020-03-15 MED ORDER — MIDODRINE HCL 10 MG PO TABS: 10.0000 mg | ORAL_TABLET | Freq: Three times a day (TID) | ORAL | Status: AC

## 2020-03-15 MED ORDER — OXYCODONE HCL 5 MG PO TABS: 5.0000 mg | ORAL_TABLET | Freq: Four times a day (QID) | ORAL | 0 refills | Status: AC

## 2020-03-15 MED ORDER — BETHANECHOL CHLORIDE 25 MG PO TABS: 25.0000 mg | ORAL_TABLET | Freq: Four times a day (QID) | ORAL | Status: AC

## 2020-03-15 MED ORDER — INSULIN GLARGINE 100 UNIT/ML ~~LOC~~ SOLN: 15.0000 [IU] | Freq: Every day | SUBCUTANEOUS | 11 refills | Status: AC

## 2020-03-15 MED ORDER — MIDAZOLAM 50MG/50ML (1MG/ML) PREMIX INFUSION: 0.5000 mg/h | INTRAVENOUS | Status: AC

## 2020-03-15 MED ORDER — FLORANEX PO PACK: 1.0000 g | PACK | Freq: Three times a day (TID) | ORAL | Status: AC

## 2020-03-15 MED ORDER — OXYCODONE HCL 5 MG PO TABS
5.0000 mg | ORAL_TABLET | Freq: Four times a day (QID) | ORAL | Status: DC
  Administered 2020-03-15: 5 mg
  Filled 2020-03-15: qty 1

## 2020-03-15 NOTE — Progress Notes (Addendum)
PT Cancellation Note  Patient Details Name: Bruce Webb MRN: [id number] DOB: [birth year]   Cancelled Treatment:    Reason Eval/Treat Not Completed: Medical issues which prohibited therapy;Patient's level of consciousness. Pt is now sedated and unresponsive. Remains on vent. PT will continue to follow and resume Rx when medically improved/able to participate. Frequency decreased to 2x week.   Ilda Foil 03/15/2020, 11:30 AM   Aida Raider, PT  Office # [phone] Pager [phone]

## 2020-03-15 NOTE — Discharge Summary (Signed)
Physician Discharge Summary  Patient ID: Bruce Webb MRN: [id number] DOB/AGE: 1941-07-11 79 y.o.  Admit date: 02/15/2020 Discharge date: 03/15/2020  Admission Diagnoses:  Discharge Diagnoses:  Principal Problem:   Acute GI bleeding Active Problems:   Essential hypertension   Pulmonary embolism (HCC)   Pneumonia with cavity of lung   Hyponatremia   Hyperglycemia   Pressure injury of skin   Goals of care, counseling/discussion   Palliative care by specialist   Multifocal pneumonia   Cavitary pneumonia   Palliative care encounter   Ventilator dependence [facility])   Pneumothorax  Discharged Condition: critical  Hospital Course:   Mr. Bruce Webb is a 79 year old male with recent COVID-19 pneumonia and pulmonary embolism on anticoagulation in February 2021 who was admitted for acute on chronic hypoxemic respiratory failure and concern for GI bleed. During his hospital course, he had increasing oxygen requirement which precluded his ability to safely undergo EGD. His acute blood loss anemia was managed with supportive management and blood transfusion with no further episodes of bleed.  His respiratory status continued to deteriorate and Pulmonary was consulted on the floor. CT Chest demonstrated worsening bilateral groundglass opacities with interval development of RUL cavitary lesion. He was not able to safely undergo bronchoscopy due to his high O2 requirements at the time. His respiratory progressively worsening and he was transferred to the ICU on 02/22/20 for intubation followed by bronchoscopy. ID was consulted for recommendations regarding his cavitary pneumonia including briefly being treated with Bactrim for suspected PCP and and voriconazole for aspergillus (antibody positive) however these were discontinued when bronchoscopy cultures returned positive for E. Faecalis with remaining culture data negative. ID recommended 4 week course of bacterial antibiotics (Augmentin) with end date  03/26/20. Patient underwent tracheostomy and PEG placement on 3/13 and and 3/17, respectively. In addition to his hospital course, he had a spontaneous right pneumothorax requiring chest tube placement on 03/11/20. He has remained difficult to wean from the ventilator, requiring sedation and low-dose pressors.  Active problem list on day of discharge as noted:  Acute on chronic hypoxic/hypercapnic respiratory failure secondary to cavitary pneumonia. Ruled out aspergillus and TB. Recent COVID 19 pneumonia in 01/2020 and pulmonary embolism. Failure to wean s/p tracheostomy. Family wishes to pursue LTACH - Full vent support - CXR and ABG PRN - Continue Augmentin for total 4 weeks (end 4/2) for cavitary pneumonia - Diureses  Acute metabolic encephalopathy 2nd to sepsis. - PAD protocol for RASS goal -2 and -3: On versed and fentanyl. Will start oxycodone 5 mg q6h.  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 - SSI with lantus  Vision loss, glaucoma. - eye drops  Anemia of critical illness. - f/u CBC - transfuse for Hb < 7 or significant bleeding  Severe physical deconditioning - will need LTAC  Consults: pulmonary/intensive care, ID and GI  Significant Diagnostic Studies:  CT Chest 02/20/20 1. Significant interval increase in extensive bilateral heterogeneous and ground-glass airspace opacity, particularly in the lower lungs, consistent with worsened multifocal infection and/or ARDS. 2. There is a new area of very dense consolidation of the right upper lobe and a new cavitary lesion of the anterior right upper lobe measuring 3.2 x 2.8 cm. Findings are concerning for superimposed secondary infection. 3.  New small bilateral  pleural  effusions. 4.  Emphysema (ICD10-J43.9).  CT Chest 03/01/20 1. Redemonstrated extensive bilateral airspace opacities, increased in the right lung apices compared to prior. Small bilateral effusions are mildly increased from prior. 2. Similar appearance of a cavitary lesion in the anterior right upper lobe. 3. New small cyst in the right lung apex measuring 1.5 cm likely a pneumatocele. 4. Trace pericardial effusion.  Treatments: antibiotics: Augmentin, anticoagulation: Eliquis and respiratory therapy: O2  Discharge Exam: Blood pressure (!) 107/48, pulse 86, temperature 97.9 F (36.6 C), temperature source Axillary, resp. rate (!) 32, height '5\' 2"'$  (1.575 m), weight 68.6 kg, SpO2 92 %.  Physical Exam: General: Chronically ill-appearing elderly male in bed, cachectic, sedated HENT: [place], AT, OP clear, MMM Neck: Cuffed trach in place, c/d/i Eyes: EOMI, no scleral icterus Respiratory: Bilateral squeaks and crackles Cardiovascular: RRR, -M/R/G, no JVD GI: BS+, soft, nontender, PEG in place Extremities:2+ pitting edema in extremities x 4,-tenderness Neuro: Unresponsive, sedated GU: Foley in place  Disposition: Discharge disposition: [place] Not Defined        Allergies as of 03/15/2020   No Known Allergies     Medication List    STOP taking these medications   acetaminophen 325 MG tablet Commonly known as: TYLENOL   Carboxymethylcellulose Sod PF 0.5 % Soln   ferrous sulfate 325 (65 FE) MG tablet   Ipratropium-Albuterol 20-100 MCG/ACT Aers respimat Commonly known as: COMBIVENT   lisinopril 10 MG tablet Commonly known as: ZESTRIL   polyethylene glycol 17 g packet Commonly known as: MIRALAX / GLYCOLAX   senna-docusate 8.6-50 MG tablet Commonly known as: Senokot-S   Simbrinza 1-0.2 % Susp Generic drug: Brinzolamide-Brimonidine   Timolol-Brimonidine-Dorzolamid 0.5-0.15-2 % Soln     TAKE these medications   amoxicillin-clavulanate  875-125 MG tablet Commonly known as: AUGMENTIN Place 1 tablet into feeding tube every 12 (twelve) hours for 11 days.   apixaban 5 MG Tabs tablet Commonly known as: ELIQUIS Place 1 tablet (5 mg total) into feeding tube 2 (two) times daily. What changed:   See the new instructions.  Another medication with the same name was removed. Continue taking this medication, and follow the directions you see here.   bethanechol 25 MG tablet Commonly known as: URECHOLINE Place 1 tablet (25 mg total) into feeding tube 4 (four) times daily.   brimonidine 0.2 % ophthalmic solution Commonly known as: ALPHAGAN Place 1 drop into both eyes 2 (two) times daily.   chlorhexidine gluconate (MEDLINE KIT) 0.12 % solution Commonly known as: PERIDEX 15 mLs by Mouth Rinse route 2 (two) times daily.   dorzolamide 2 % ophthalmic solution Commonly known as: TRUSOPT Place 1 drop into both eyes 2 (two) times daily.   fentaNYL 10 mcg/ml Soln infusion Inject 0-400 mcg/hr into the vein continuous.   insulin aspart 100 UNIT/ML injection Commonly known as: novoLOG Inject 0-15 Units into the skin every 4 (four) hours.   insulin glargine 100 UNIT/ML injection Commonly known as: LANTUS Inject 0.15 mLs (15 Units total) into the skin daily. Start taking on: March 16, 2020   lactobacillus Pack Place 1 packet (1 g total) into feeding tube every 8 (eight) hours.   latanoprost 0.005 % ophthalmic solution Commonly known as: XALATAN Place 1 drop into both eyes at bedtime.   MIDAZOLAM '50MG'$ Drexel Iha ('1MG'$ /ML) PREMIX INFUSION Inject 0.5-2 mg/hr into the vein continuous.   midodrine 10 MG tablet Commonly known as: PROAMATINE Place 1 tablet (10 mg total) into feeding tube 3 (three) times daily with  meals.   mouth rinse Liqd solution 15 mLs by Mouth Rinse route every 2 (two) hours as needed.   norepinephrine 4-5 MG/250ML-% Soln Commonly known as: LEVOPHED Inject 0-40 mcg/min into the vein continuous.   oxyCODONE 5  MG immediate release tablet Commonly known as: Oxy IR/ROXICODONE Place 1 tablet (5 mg total) into feeding tube every 6 (six) hours.   pantoprazole sodium 40 mg/20 mL Pack Commonly known as: PROTONIX Place 20 mLs (40 mg total) into feeding tube daily. Start taking on: March 16, 2020        Signed: Margaretha Seeds 03/15/2020, 1:35 PM

## 2020-03-15 NOTE — TOC Transition Note (Signed)
Transition of Care [facility]) - CM/SW Discharge Note   Patient Details  Name: Bruce Webb MRN: [id number] Date of Birth: Mar 10, 1941  Transition of [facility]) CM/SW Contact:  Cherylann Parr, RN Phone Number: 03/15/2020, 3:18 PM   Clinical Narrative:   Pt deemed safe for discharge to [facility] today.  [facility] has a bed today however [facility] does not.  CM spoke with pts grandson Cato Mulligan - family in agreement to discharge to [facility] today.  Attending also had discussion with both grandson and son - attending confirms their acceptance of [facility] bed today.   [facility] ICU bed 417 Report number:  [phone], [phone] Accepting doctor : Dr Claris Gower  Pt will need to transport to facility via [facility].  Bedside nurse to call report and then call [facility] for transport once pt is ready.  CM printed transport pkg to nurses station.   Both attending and bedside nurse to complete EMTALA prior to pt leaving [facility] facility.  CM signing off    Final next level of care: Long Term Acute Care (LTAC) Barriers to Discharge: Barriers Resolved   Patient Goals and CMS Choice     Choice offered to / list presented to : Adult Children  Discharge Placement              Patient chooses bed at: (Son and grandson chose bed at [facility]) Patient to be transferred to facility by: [facility] Name of family member notified: Son and grandson Patient and family notified of of transfer: 03/15/20  Discharge Plan and Services                                     Social Determinants of Health (SDOH) Interventions     Readmission Risk Interventions No flowsheet data found.

## 2020-03-15 NOTE — Progress Notes (Signed)
Patient son updated via translator on patient status and plan of care. He is aware of current critical state, all questions answered at this time.

## 2020-03-15 NOTE — Progress Notes (Signed)
OT Cancellation Note  Patient Details Name: Bruce Webb MRN: [id number] DOB: 11-Jun-1941   Cancelled Treatment:    Reason Eval/Treat Not Completed: Pt with medical decline, sedated and remains on vent. Will follow.  Evern Bio 03/15/2020, 1:08 PM  Martie Round, OTR/L Acute Rehabilitation Services Pager: [phone] Office: [phone]

## 2020-03-15 NOTE — Care Management Important Message (Signed)
Important Message  Patient Details  Name: Bruce Webb MRN: [id number] Date of Birth: [birth year]   Medicare Important Message Given:  Yes - Important Message mailed due to current National Emergency  Verbal consent obtained due to current National Emergency  Relationship to patient: Grandchild Contact Name: Oday Ridings Call Date: 03/15/20  Time: 1522 Phone: [phone] Outcome: Spoke with contact Important Message mailed to: Other (must enter comment)(per patients grandson, additional IM declined. Is aware one is available if needed)    Orson Aloe 03/15/2020, 3:23 PM

## 2020-03-15 NOTE — Progress Notes (Signed)
NAME:  Bruce Webb, MRN:  [id number], DOB:  01-26-1941, LOS: 76 ADMISSION DATE:  02/15/2020, CONSULTATION DATE:  02/19/2020 REFERRING MD:  Dr. Karleen Hampshire, CHIEF COMPLAINT:  Hypotension/ hypoxia    Brief History   79 year old male post COVID and PE in January on Eliquis admitted 2/21 to [facility] for extertional dyspnea found to be anemic with hemoccult positive stools.  GI consulted and transfused PRBC with since stabilized Hgb but has had persistent higher O2 requirements.  Was diuresed but  developed hypotension, with ongoing higher O2 requirements, PCCM was asked to consult.      Past Medical History  HTN, HFpEF, DMT2, glaucoma with progressive vision loss (left, now rapid loss in right) - recent COVID 01/16/2020 with subsequent PE on Eliquis and O2 2L [place]  Significant Hospital Events   2/21 admit. Stool ob =negatoive 2/23- echo ef 65% 2/25 PCCM consulted for hypotension and persistent hypoxemia 2/26 - Has severe desaturations with activity and eating. Required 10L while eating his meal today. No acute distress but continues to have shortness of breath. Denies chest pain.  2/27 -  -remains on 10 L.  He easily desaturates.  [name] at the bedside.  I spoke to her and subsequently also to their family friend who is in physician assistant in cardiology [employer]).  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  According to the daughter he got worse with COVID-19 then got better went home but he declined again in terms of his hypoxemia.  2/28 moved to ICU, intubated. Bronch completed at bedside. Infectious disease consulted for cavitary PNA. Bactrim started for possible PCP 3/3 started on Voriconazole for + aspergillus ag 3/4 not weaning due to agitation 3/9 summary: treating for complicated cavitary PNA felt 2/2 e faecalis and aspergillus ag +  3/12 ID sign off note: stopping Voreconizole. Recommending 10-14 more days augmentin w/ f/u CT chest 2-3 weeks also  recommending MRI of brain at some point to r/o septic embolic disease 9/44 trach  3/16 getting CT abd to eval for PEG 3/17 getting OOB 1x lasix dose. Adding anxiolytic (got freq PRN sed last evening). Adding probiotic and one day of lomotil to see if we can slow down diarrhea. Having sig water loss still. Added urecholine for urinary retention.  3/18 placing foley cath and adjusting urecholine d/t urinary rentention, developed spontaneous right pneumothorax requiring chest tube 3/19 tolerating diuresis  Consults:  GI   Procedures:   Significant Diagnostic Tests:  2/21 CTH >> 1. No acute intracranial abnormalities. 2. Mild cerebral atrophy with chronic microvascular ischemic changes in the cerebral white matter, as above.  CTA 01/27/20 - Diffuse subpleural/peripheral interstitial ground glass opacities with increased involvement in the the lower lobes bilaterally R>L. CXR 02/16/20 R>L multifocal pneumonia R>L, no edema or effusion  2/23 TTE >> 1. Left ventricular ejection fraction, by estimation, is 60 to 65%. The left ventricle has normal function. The left ventricle has no regional wall motion abnormalities. Left ventricular diastolic parameters are consistent with Grade II diastolic dysfunction (pseudonormalization).  2. The history indicates the patient has a pulmonary embolus. The RV is  not dilated and there is normal RV function. . Right ventricular systolic function is low normal. The right ventricular size is normal.  3. The mitral valve is normal in structure and function. Mild mitral valve regurgitation. No evidence of mitral stenosis.  4. The aortic valve is normal in structure and function. Aortic valve  regurgitation is not visualized. No aortic stenosis is present.  CT abd 3/17 (for PEG):1. Marked distention of the bladder with secondary slight dilatation of the renal collecting systems bilaterally. 2. Diffuse anasarca.  No ascites. 3. Small bilateral pleural effusions,  left greater than right. 4. Extensive bilateral pulmonary infiltrates, unchanged.  Micro Data:  2/21 BCx 2 >> ngtd x 4 days 2/25 - CCP 2/27 - quant gold -  2/27 RVP - negative 2/28  - urine strep - negative 228 - urine leg -  2/28 - aspergillus Ab -  xxxxxxxxxxxx 3/1 BAL Cx, fungal, acid fast >> negative 3/1 BAL PCP>> negative 3/ blood cultures - ngtd 3/4 enterococcus Faecalis; few candida lusitaniana   Antimicrobials:  2/21 azithro>>2/26 2/21 ceftriaxone ->off 2/22 cefepime >>off 2/28 Bactrim >3/4 3/1 Flagyl>81f 3/2 Voriconazole >>3/12 3/4 Zyvox->off 3/12: augmentin (this was after 8 days of unasyn)  Interim history/subjective:  On sedation and mechanical ventilation. Unresponsive on versed gtt  Objective   Blood pressure (!) 91/53, pulse 88, temperature 97.6 F (36.4 C), temperature source Axillary, resp. rate (!) 30, height _0  (1.575 m), weight 68.6 kg, SpO2 97 %.    Vent Mode: PRVC FiO2 (%):  [60 %-70 %] 60 % Set Rate:  [30 bmp] 30 bmp Vt Set:  [400 mL] 400 mL PEEP:  [5 cmH20] 5 cmH20 Plateau Pressure:  [37 cmH20-42 cmH20] 42 cmH20   Intake/Output Summary (Last 24 hours) at 03/15/2020 1022 Last data filed at 03/15/2020 0816 Gross per 24 hour  Intake 2948.77 ml  Output 1195 ml  Net 1753.77 ml   Filed Weights   03/13/20 0328 03/14/20 0439 03/15/20 0249  Weight: 65.4 kg 66.7 kg 68.6 kg   Physical Exam: General: Chronically ill-appearing elderly male in bed, cachectic, sedated HENT: [place], AT, OP clear, MMM Neck: Cuffed trach in place, c/d/i Eyes: EOMI, no scleral icterus Respiratory: Bilateral squeaks and crackles Cardiovascular: RRR, -M/R/G, no JVD GI: BS+, soft, nontender, PEG in place Extremities:2+ pitting edema in extremities x 4,-tenderness Neuro: Unresponsive, sedated GU: Foley in place  CXR 3/22 - Similar/slightly worsened bilateral airspace disease, no discernible pneumothorax, right chest tube in place  Resolved Hospital Problem list     Elevated LFTs, Acute blood loss anemia, AKI, Hyperkalemia, Urine retention, Hypernatremia  Assessment & Plan:   Acute on chronic hypoxic/hypercapnic respiratory failure secondary to cavitary pneumonia. Ruled out aspergillus and TB. Recent COVID 19 pneumonia in 01/2020 and pulmonary embolism. Failure to wean s/p tracheostomy. Family wishes to pursue LTACH - Full vent support - CXR and ABG PRN - Continue Augmentin for total 4 weeks (end 4/2) for cavitary pneumonia - Diureses  Acute metabolic encephalopathy 2nd to sepsis. - PAD protocol for RASS goal -2 and -3: On versed and fentanyl. Will start oxycodone 5 mg q6h.  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 - SSI with lantus  Vision loss, glaucoma. - eye drops  Anemia of critical illness. - f/u CBC - transfuse for Hb < 7 or significant bleeding  Severe physical deconditioning - will need LTAC  Goals of care. - had lengthy d/w pt's grandson on 3/20 >> explained concern he will not be able to recover to the point of returning home - family would like to continue aggressive measures, including LTAC placement when  able   Daily Goals Checklist  DVT prophylaxis: eliquis Nutrition: tube feeds GI prophylaxis: pantoprazole Mobility: will need LTAC Code Status: full Disposition: ICU  Labs:   CMP Latest Ref Rng & Units 03/15/2020 03/14/2020 03/14/2020  Glucose 70 - 99 mg/dL 193(H) - 155(H)  BUN 8 - 23 mg/dL 36(H) - 34(H)  Creatinine 0.61 - 1.24 mg/dL 0.80 - 0.79  Sodium 135 - 145 mmol/L 140 142 140  Potassium 3.5 - 5.1 mmol/L 4.3 3.6 3.4(L)  Chloride 98 - 111 mmol/L 104 - 106  CO2 22 - 32 mmol/L 29 - 29  Calcium 8.9 - 10.3 mg/dL 7.3(L) - 7.3(L)  Total Protein 6.5 - 8.1 g/dL - - -  Total  Bilirubin 0.3 - 1.2 mg/dL - - -  Alkaline Phos 38 - 126 U/L - - -  AST 15 - 41 U/L - - -  ALT 0 - 44 U/L - - -    CBC Latest Ref Rng & Units 03/15/2020 03/14/2020 03/14/2020  WBC 4.0 - 10.5 K/uL 12.1(H) - 17.2(H)  Hemoglobin 13.0 - 17.0 g/dL 8.8(L) 9.5(L) 9.1(L)  Hematocrit 39.0 - 52.0 % 30.6(L) 28.0(L) 31.0(L)  Platelets 150 - 400 K/uL 125(L) - 139(L)    ABG    Component Value Date/Time   PHART 7.225 (L) 03/14/2020 0341   PCO2ART 71.9 (HH) 03/14/2020 0341   PO2ART 50.0 (L) 03/14/2020 0341   HCO3 29.7 (H) 03/14/2020 0341   TCO2 32 03/14/2020 0341   ACIDBASEDEF 6.0 (H) 02/29/2020 1140   O2SAT 76.0 03/14/2020 0341    CBG (last 3)  Recent Labs    03/15/20 0005 03/15/20 0415 03/15/20 0814  GLUCAP 198* 169* 170*   The patient is critically ill with multiple organ systems failure and requires high complexity decision making for assessment and support, frequent evaluation and titration of therapies, application of advanced monitoring technologies and extensive interpretation of multiple databases.   Critical Care Time devoted to patient care services described in this note is 35 Minutes.   Rodman Pickle, M.D. [facility] Pulmonary/Critical Care Medicine 03/15/2020 10:22 AM   Please see Amion for pager number to reach on-call Pulmonary and Critical Care Team.

## 2020-03-15 NOTE — Progress Notes (Signed)
Nursing report given via phone to [employer]. At [facility] ICU. All questions answered.   Nursing report given via phone to [facility].   Spoke with grandson via phone about clarification of transfer, he is agreeable to transporting patient to the [facility] ICU/ LTAC.   Spoke with son in person via translator about clarification of transfer to [employer], he agrees as well.   CM, Sam updated on patient status and plan of care.   All belongings sent home with family, medical equipment sent with patient for transfer.

## 2020-03-22 DIAGNOSIS — U071 COVID-19: Secondary | ICD-10-CM

## 2020-03-22 DIAGNOSIS — I2699 Other pulmonary embolism without acute cor pulmonale: Secondary | ICD-10-CM

## 2020-03-22 DIAGNOSIS — J9621 Acute and chronic respiratory failure with hypoxia: Secondary | ICD-10-CM

## 2020-03-22 DIAGNOSIS — J9311 Primary spontaneous pneumothorax: Secondary | ICD-10-CM

## 2020-03-23 DIAGNOSIS — J9311 Primary spontaneous pneumothorax: Secondary | ICD-10-CM

## 2020-03-23 DIAGNOSIS — J9621 Acute and chronic respiratory failure with hypoxia: Secondary | ICD-10-CM

## 2020-03-23 DIAGNOSIS — U071 COVID-19: Secondary | ICD-10-CM

## 2020-03-23 DIAGNOSIS — I2699 Other pulmonary embolism without acute cor pulmonale: Secondary | ICD-10-CM

## 2020-03-24 DIAGNOSIS — I2699 Other pulmonary embolism without acute cor pulmonale: Secondary | ICD-10-CM

## 2020-03-24 DIAGNOSIS — J9621 Acute and chronic respiratory failure with hypoxia: Secondary | ICD-10-CM

## 2020-03-24 DIAGNOSIS — U071 COVID-19: Secondary | ICD-10-CM

## 2020-03-24 DIAGNOSIS — J9311 Primary spontaneous pneumothorax: Secondary | ICD-10-CM

## 2020-03-25 DIAGNOSIS — I2699 Other pulmonary embolism without acute cor pulmonale: Secondary | ICD-10-CM

## 2020-03-25 DIAGNOSIS — U071 COVID-19: Secondary | ICD-10-CM

## 2020-03-25 DIAGNOSIS — J9311 Primary spontaneous pneumothorax: Secondary | ICD-10-CM

## 2020-03-25 DIAGNOSIS — J9621 Acute and chronic respiratory failure with hypoxia: Secondary | ICD-10-CM

## 2020-03-26 DIAGNOSIS — J9311 Primary spontaneous pneumothorax: Secondary | ICD-10-CM

## 2020-03-26 DIAGNOSIS — U071 COVID-19: Secondary | ICD-10-CM

## 2020-03-26 DIAGNOSIS — J9621 Acute and chronic respiratory failure with hypoxia: Secondary | ICD-10-CM

## 2020-03-26 DIAGNOSIS — I2699 Other pulmonary embolism without acute cor pulmonale: Secondary | ICD-10-CM

## 2020-03-27 DIAGNOSIS — I2699 Other pulmonary embolism without acute cor pulmonale: Secondary | ICD-10-CM

## 2020-03-27 DIAGNOSIS — J9621 Acute and chronic respiratory failure with hypoxia: Secondary | ICD-10-CM

## 2020-03-27 DIAGNOSIS — J9311 Primary spontaneous pneumothorax: Secondary | ICD-10-CM

## 2020-03-27 DIAGNOSIS — U071 COVID-19: Secondary | ICD-10-CM

## 2020-03-28 DIAGNOSIS — J9311 Primary spontaneous pneumothorax: Secondary | ICD-10-CM

## 2020-03-28 DIAGNOSIS — U071 COVID-19: Secondary | ICD-10-CM

## 2020-03-28 DIAGNOSIS — I2699 Other pulmonary embolism without acute cor pulmonale: Secondary | ICD-10-CM

## 2020-03-28 DIAGNOSIS — J9621 Acute and chronic respiratory failure with hypoxia: Secondary | ICD-10-CM

## 2020-04-07 LAB — ACID FAST CULTURE WITH REFLEXED SENSITIVITIES (MYCOBACTERIA): Acid Fast Culture: NEGATIVE

## 2020-04-10 LAB — ACID FAST CULTURE WITH REFLEXED SENSITIVITIES (MYCOBACTERIA): Acid Fast Culture: NEGATIVE

## 2020-04-22 LAB — ACID FAST CULTURE WITH REFLEXED SENSITIVITIES (MYCOBACTERIA): Acid Fast Culture: NEGATIVE

## 2020-04-24 DEATH — deceased

## 2020-11-03 IMAGING — CT CT ABD-PELV W/O CM
2 of 4 series · 16 of 46 positions shown, 18 images · non-contrast
Comparison: CT scan of the chest dated 03/01/2020

CLINICAL DATA: Dysphagia. Preprocedure evaluation for PEG tube
placement.

EXAM:
CT ABDOMEN AND PELVIS WITHOUT CONTRAST
TECHNIQUE: Multidetector CT imaging of the abdomen and pelvis was performed
following the standard protocol without IV contrast.

[Series 3: a/p w/o 5mm · axial · non-contrast · 0.75mm/px · z∈[+672,+1137]mm · 13 of 101 slices shown, 15 images]
[im 4/101  soft-tissue]
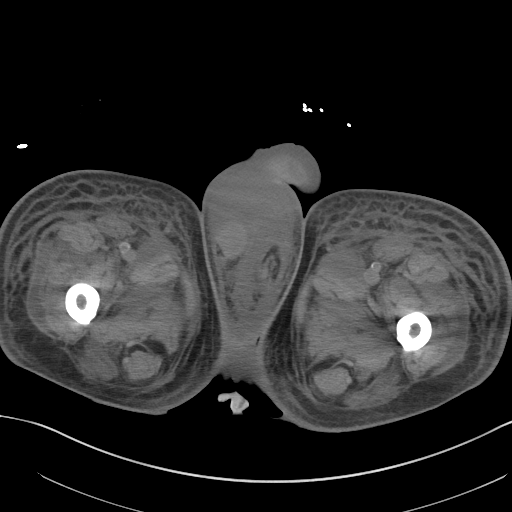
[im 4/101  bone]
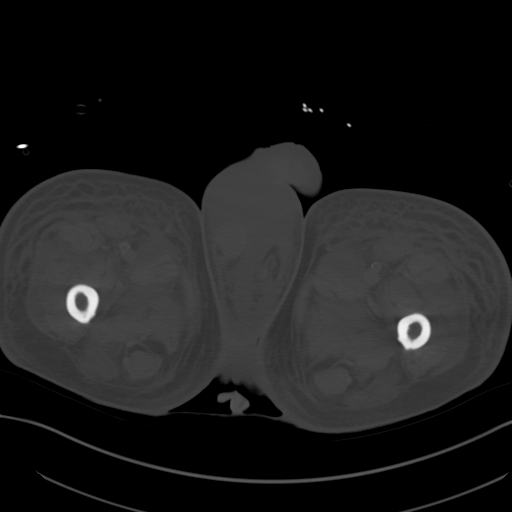
[im 12/101  soft-tissue]
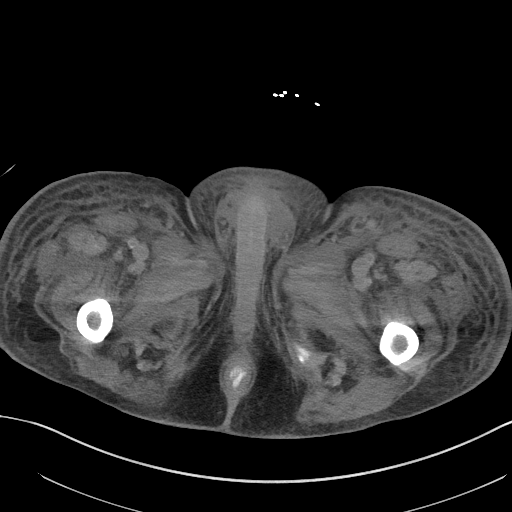
[im 20/101  soft-tissue]
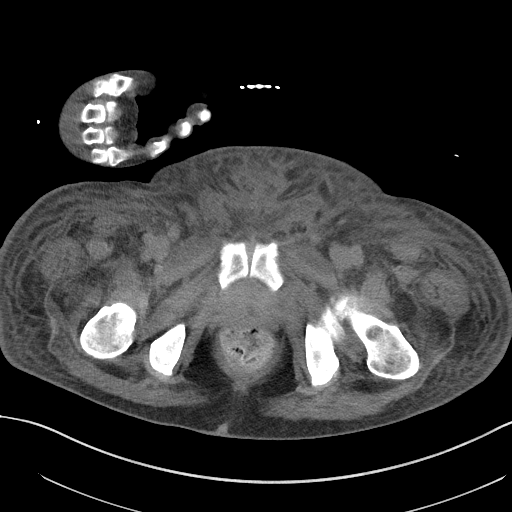
[im 27/101  soft-tissue]
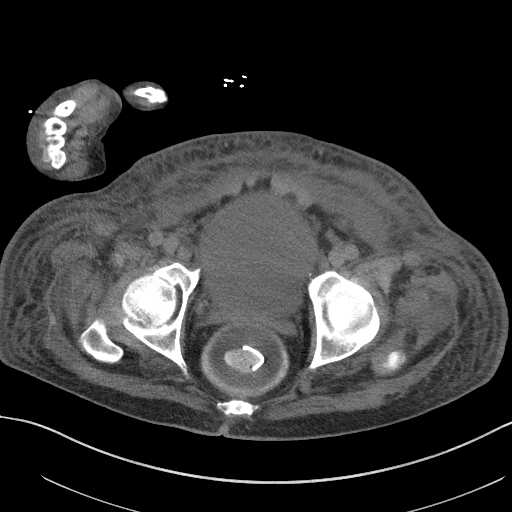
[im 35/101  soft-tissue]
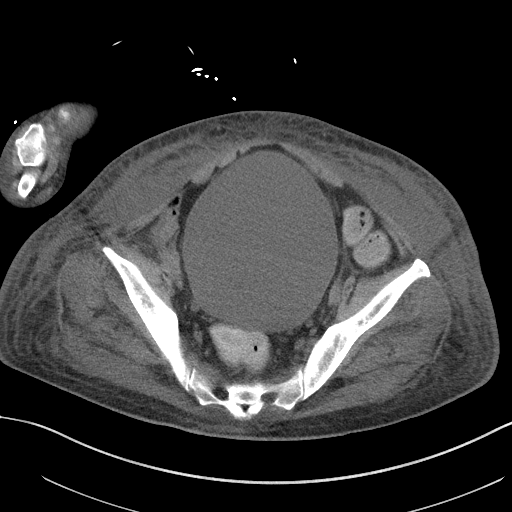
[im 43/101  soft-tissue]
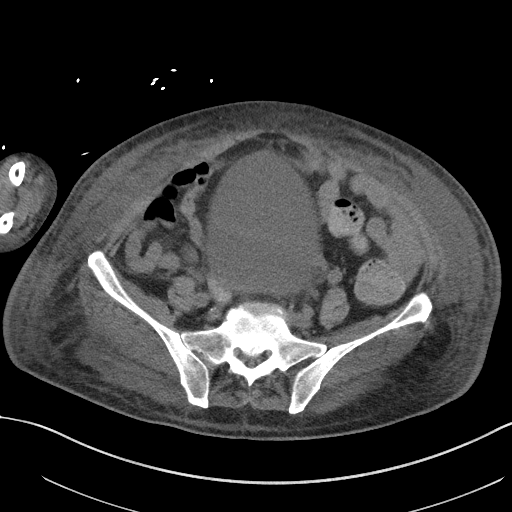
[im 51/101  soft-tissue]
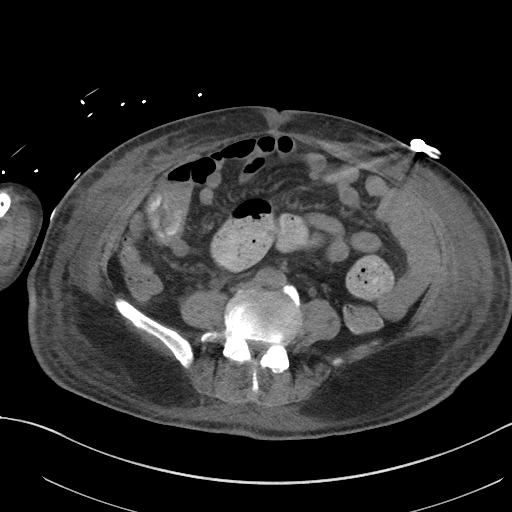
[im 58/101  soft-tissue]
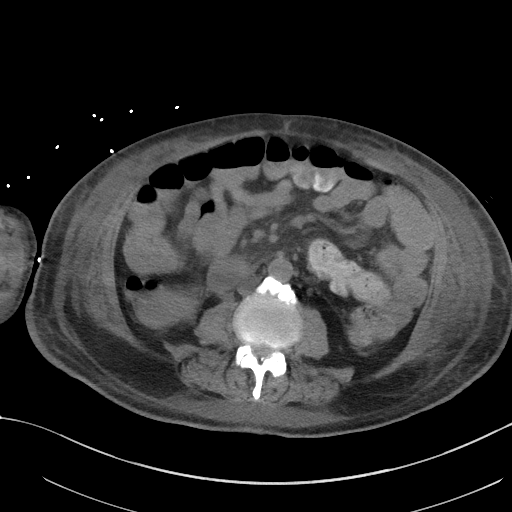
[im 66/101  soft-tissue]
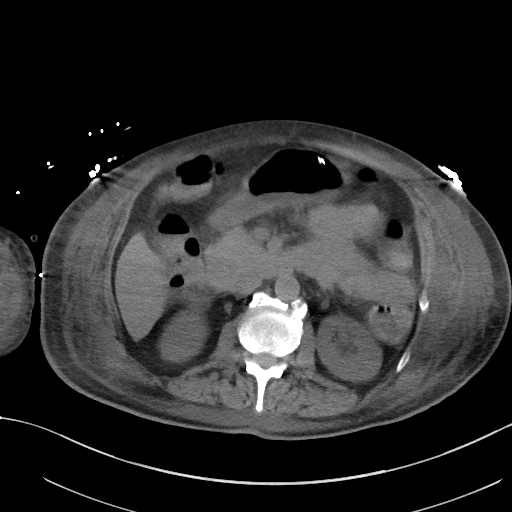
[im 66/101  bone]
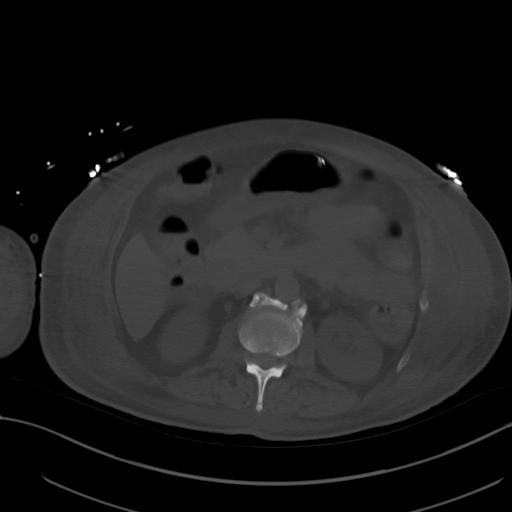
[im 74/101  soft-tissue]
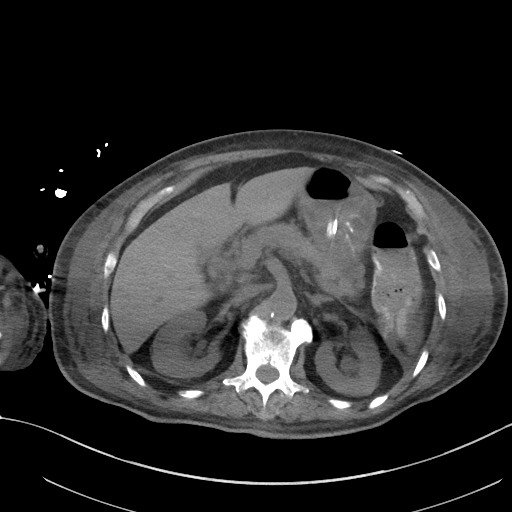
[im 81/101  soft-tissue]
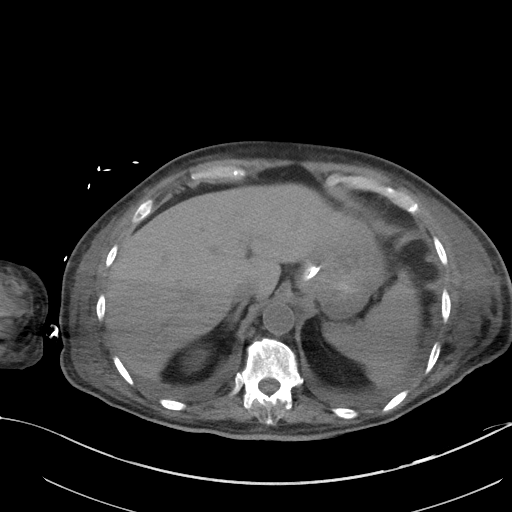
[im 89/101  soft-tissue]
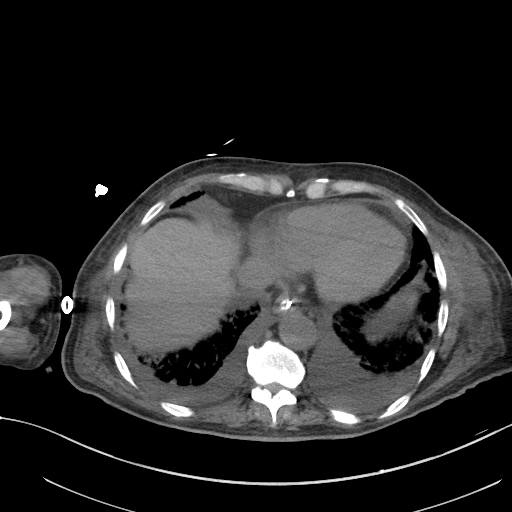
[im 97/101  soft-tissue]
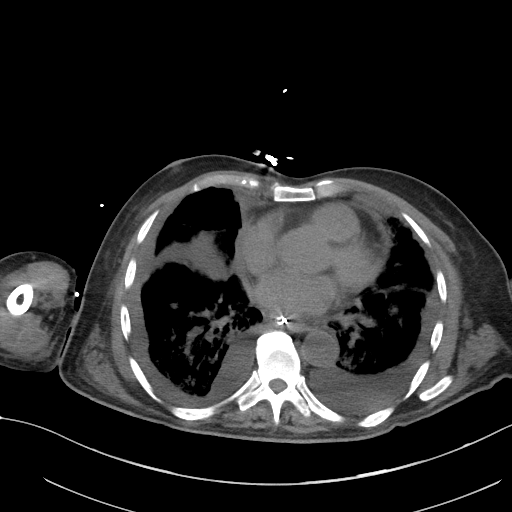

[Series 6: a/p w/o cor · coronal · non-contrast · 0.84mm/px · 3 of 132 slices shown]
[im 44/132  soft-tissue]
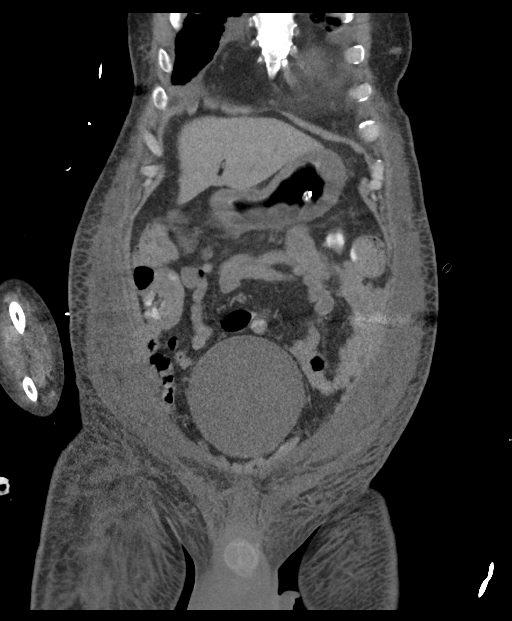
[im 59/132  soft-tissue]
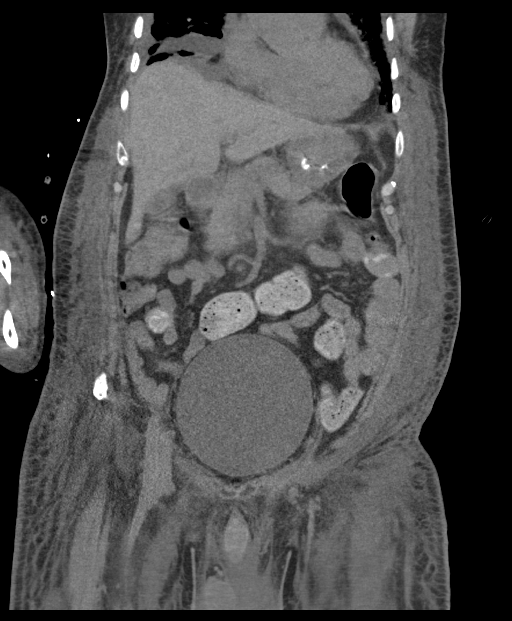
[im 73/132  soft-tissue]
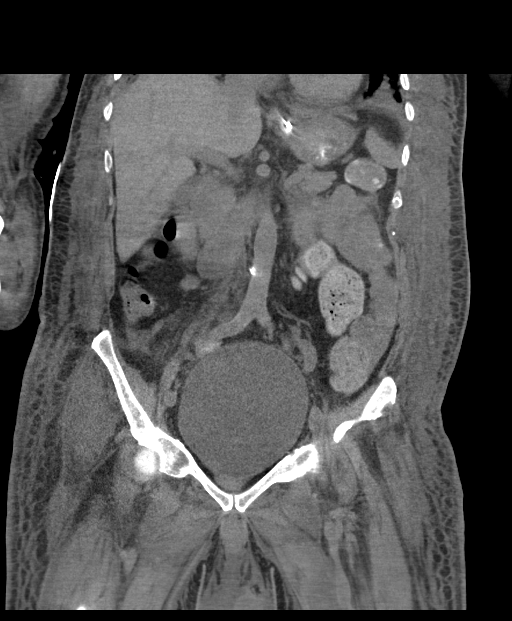

[16 of 46 positions shown; findings below may reference images not displayed]

FINDINGS: Lower chest: Small bilateral pleural effusions, left greater than
right and extensive pulmonary infiltrates persist unchanged.

Hepatobiliary: No focal liver abnormality is seen. No gallstones,
gallbladder wall thickening, or biliary dilatation.

Pancreas: Unremarkable. No pancreatic ductal dilatation or
surrounding inflammatory changes.

Spleen: Normal in size without focal abnormality.

Adrenals/Urinary Tract: Adrenal glands are normal. Slight dilatation
of the renal collecting systems bilaterally including the ureters.
The bladder is markedly distended. I suspect this accounts for the
slight dilatation of the ureters.

Stomach/Bowel: NG tube in the otherwise normal appearing stomach.
The bowel is otherwise normal including the terminal ileum and
appendix. Rectal drain in place.

Vascular/Lymphatic: No significant vascular findings are present. No
enlarged abdominal or pelvic lymph nodes.

Reproductive: Prostate is unremarkable.

Other: Diffuse anasarca. This is most extensive in the flanks and in
the anterior aspect of the pelvis and proximal thighs. No
significant subcutaneous edema anterior to the stomach.

Musculoskeletal: No acute or significant osseous findings.
IMPRESSION: 1. Marked distention of the bladder with secondary slight dilatation
of the renal collecting systems bilaterally.
2. Diffuse anasarca.  No ascites.
3. Small bilateral pleural effusions, left greater than right.
4. Extensive bilateral pulmonary infiltrates, unchanged.

## 2020-11-05 IMAGING — DX DG CHEST 1V PORT
1 series · 1 of 1 positions shown · non-contrast
Comparison: radiograph 03/11/2020 at 313 hours

CLINICAL DATA: Pneumothorax

EXAM:
PORTABLE CHEST 1 VIEW

[chest]
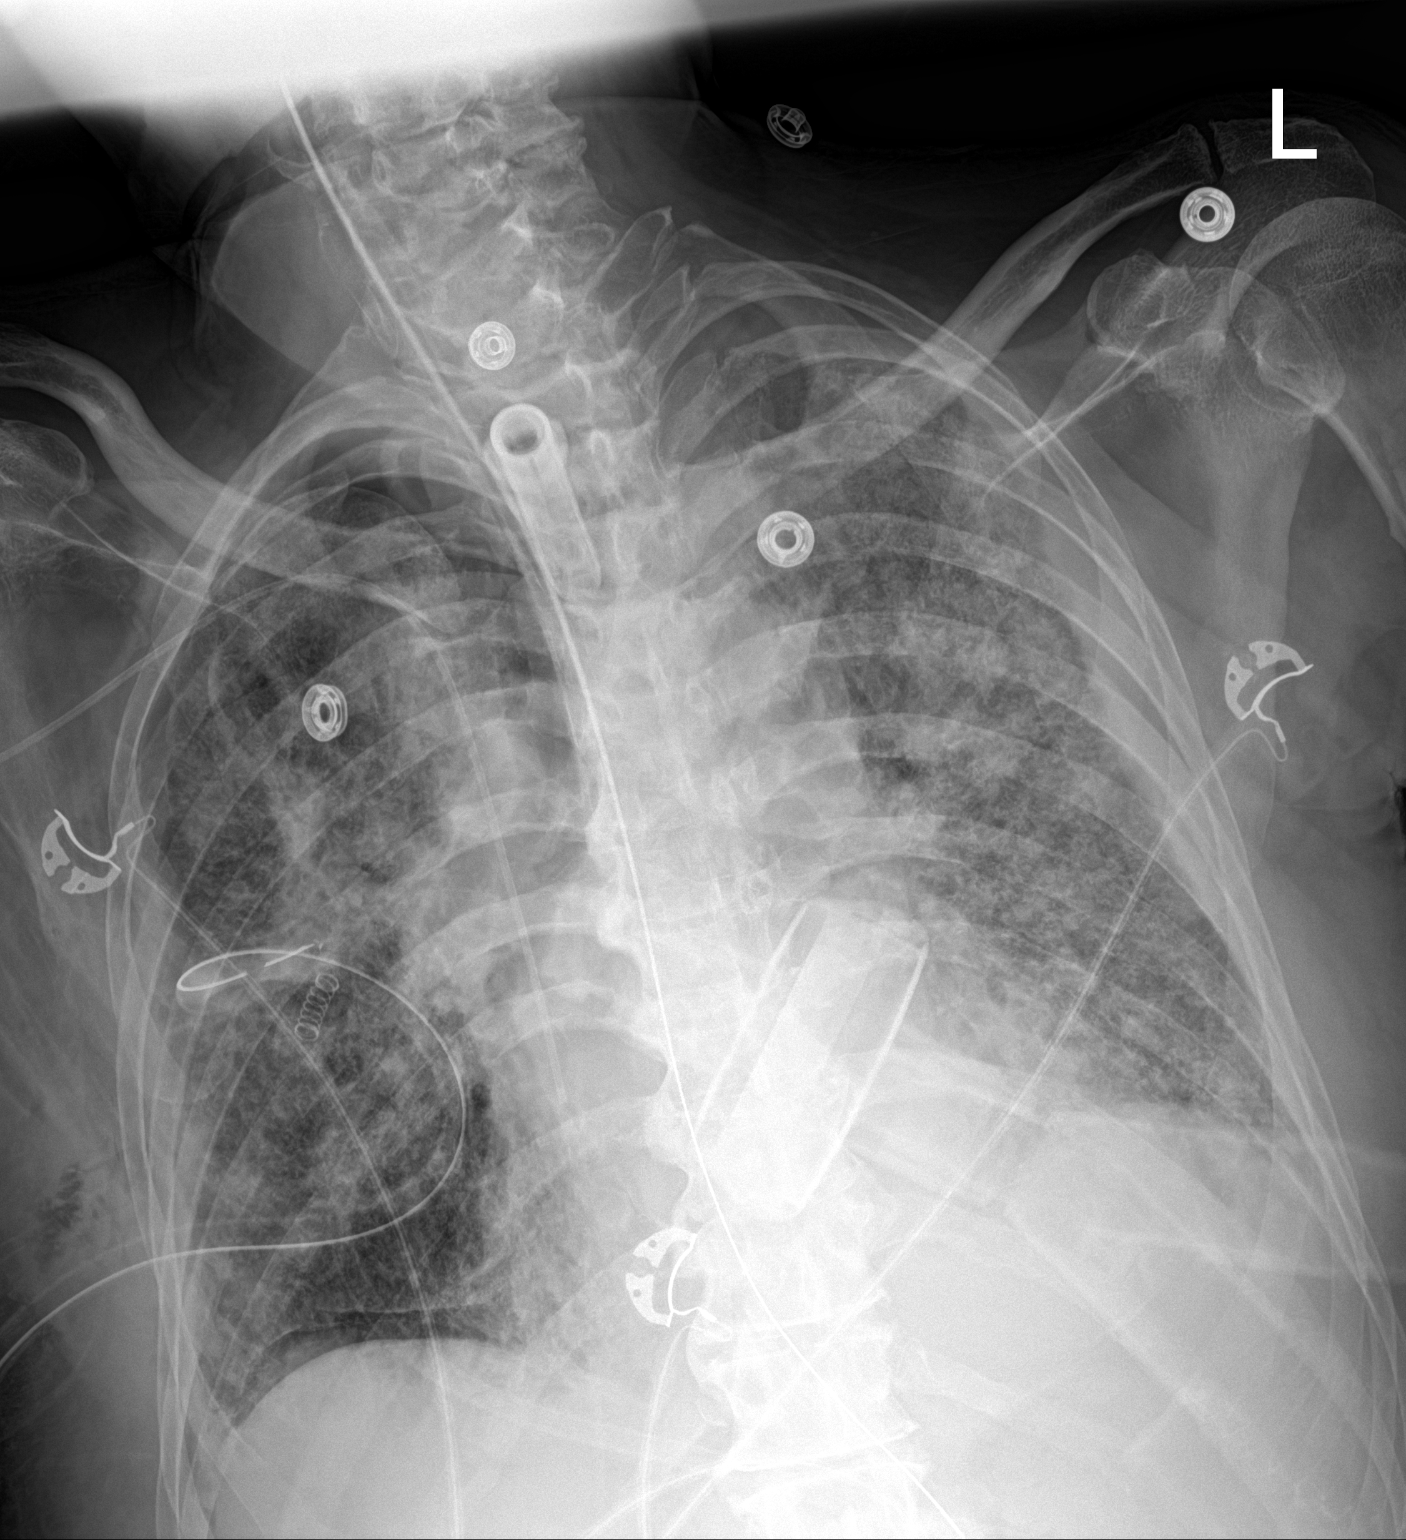

[1 of 1 positions shown; findings below may reference images not displayed]

FINDINGS: Placement of a RIGHT-sided chest tube with some expansion of the
RIGHT lung. The RIGHT apical pleural edge now measures 12 mm from
the apical chest wall compared to 27 mm.

NG tube, tracheostomy tube and central venous line unchanged.
Diffuse ground-glass opacities in lungs are unchanged. Moderate LEFT
effusion extends the apex.
IMPRESSION: 1. Decrease in RIGHT pneumothorax volume following chest tube
placement. RIGHT residual pneumothorax at approximately 15%.
2. Stable diffuse bilateral airspace opacities superimposed on
interstitial lung disease.

## 2020-11-06 IMAGING — DX DG CHEST 1V PORT
1 series · 1 of 1 positions shown · non-contrast
Comparison: Radiograph 03/11/2020

CLINICAL DATA: Acute respiratory failure

EXAM:
PORTABLE CHEST 1 VIEW

[chest ap]
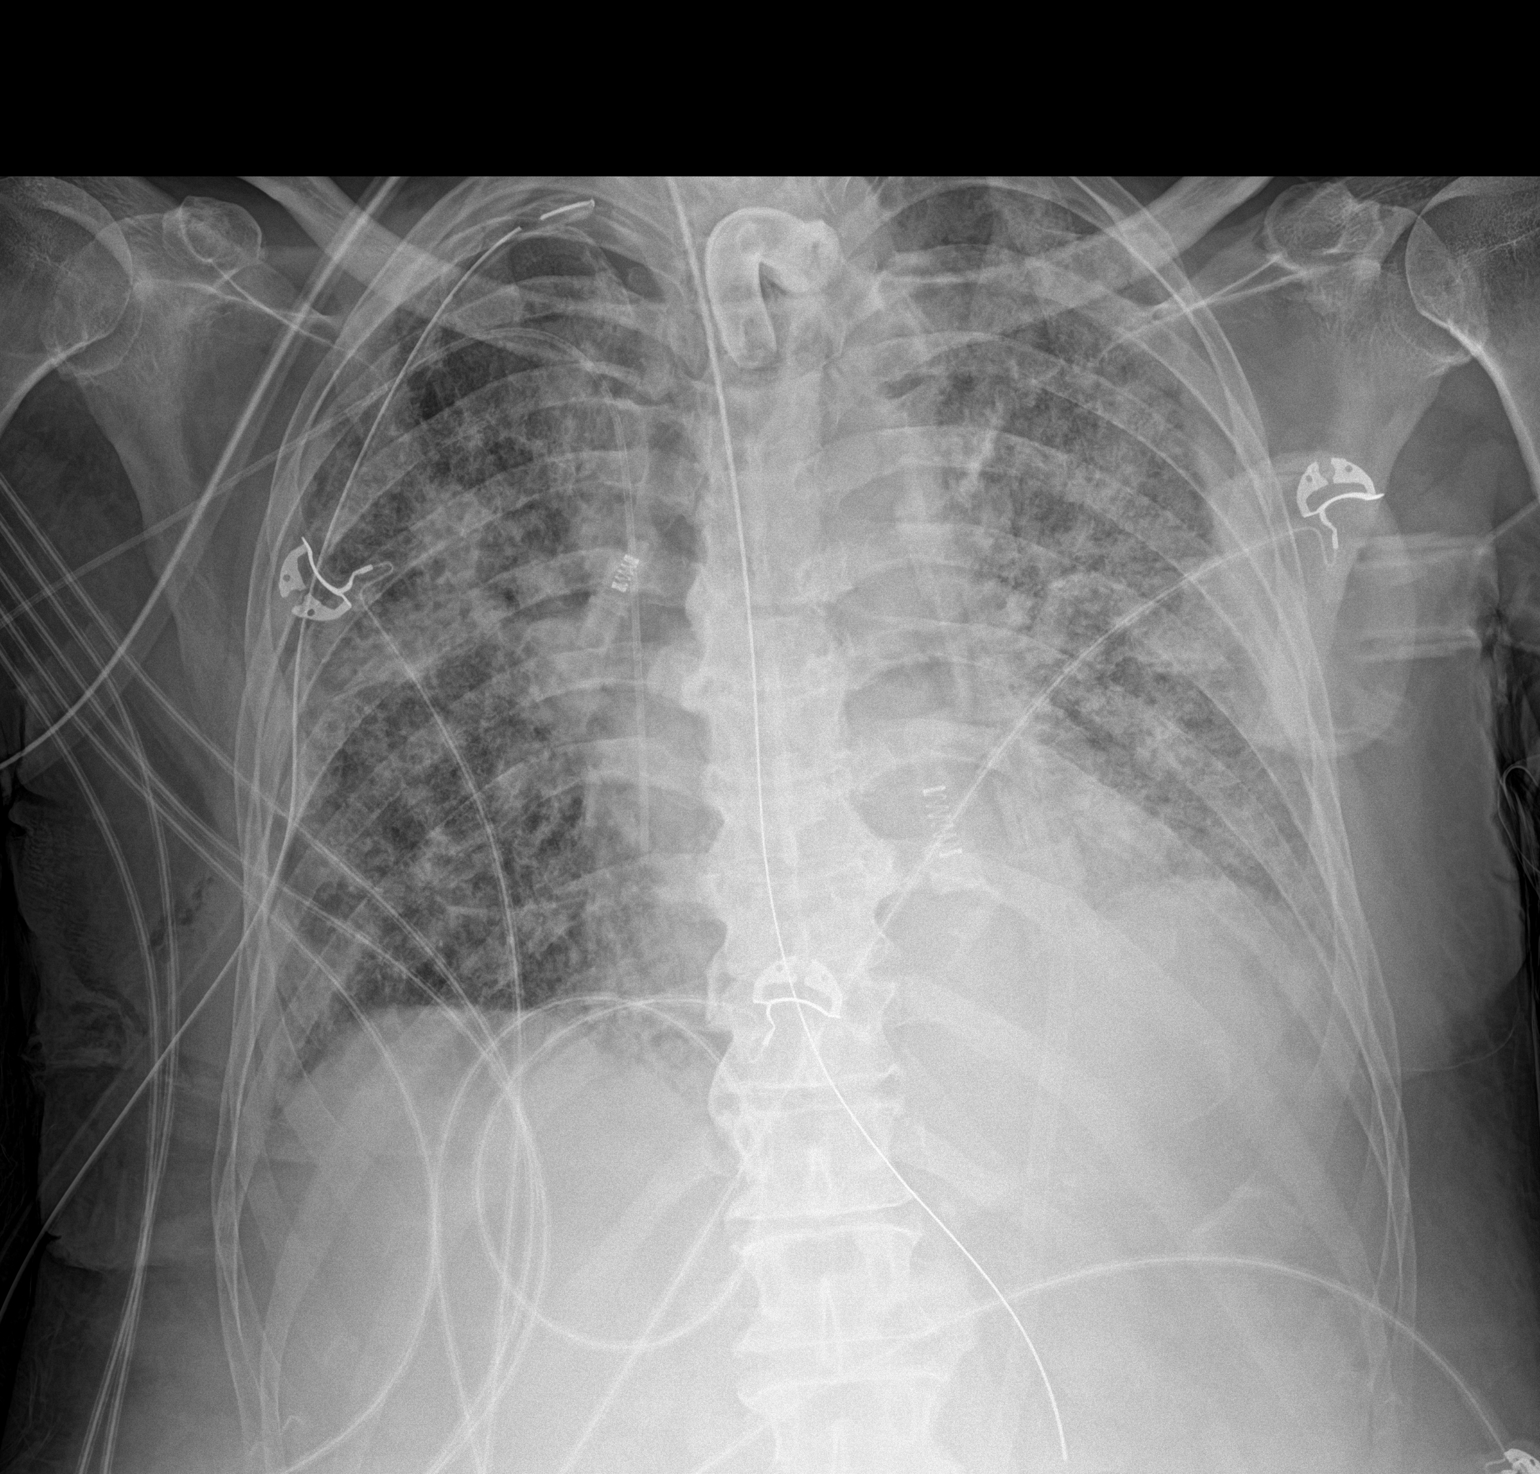

[1 of 1 positions shown; findings below may reference images not displayed]

FINDINGS: *Tracheostomy tube terminates in the mid trachea.
*A transesophageal tube tip and side port distal to the GE junction.
*A right upper extremity PICC terminates at the right atrium.
*A right apically directed chest tube remains in place.
*Telemetry leads overlie the chest.

Persistent bilateral mixed consolidative and interstitial opacities
throughout both lungs with some improving opacity in the right upper
lung likely reflecting slight re-expansion of the right upper lobe
with decreasing size of the right apical pneumothorax. Small amount
of right apical gas remains. Cardiomediastinal contours are stable.
Small amount of gas within the soft tissues of the right chest wall
likely related to chest tube placement
IMPRESSION: 1. Slight interval decrease in size of right apical pneumothorax.
2. Persistent bilateral mixed consolidative and interstitial
opacities throughout both lungs with some improvement in the right
upper lung likely secondary to some re-expansion.
3. Small amount of residual subcutaneous emphysema of the right
chest wall.
4. Support devices as above.

## 2020-11-07 IMAGING — DX DG CHEST 1V PORT
1 series · 1 of 1 positions shown · non-contrast
Comparison: March 12, 2020

CLINICAL DATA: Pneumothorax

EXAM:
PORTABLE CHEST 1 VIEW

[chest ap]
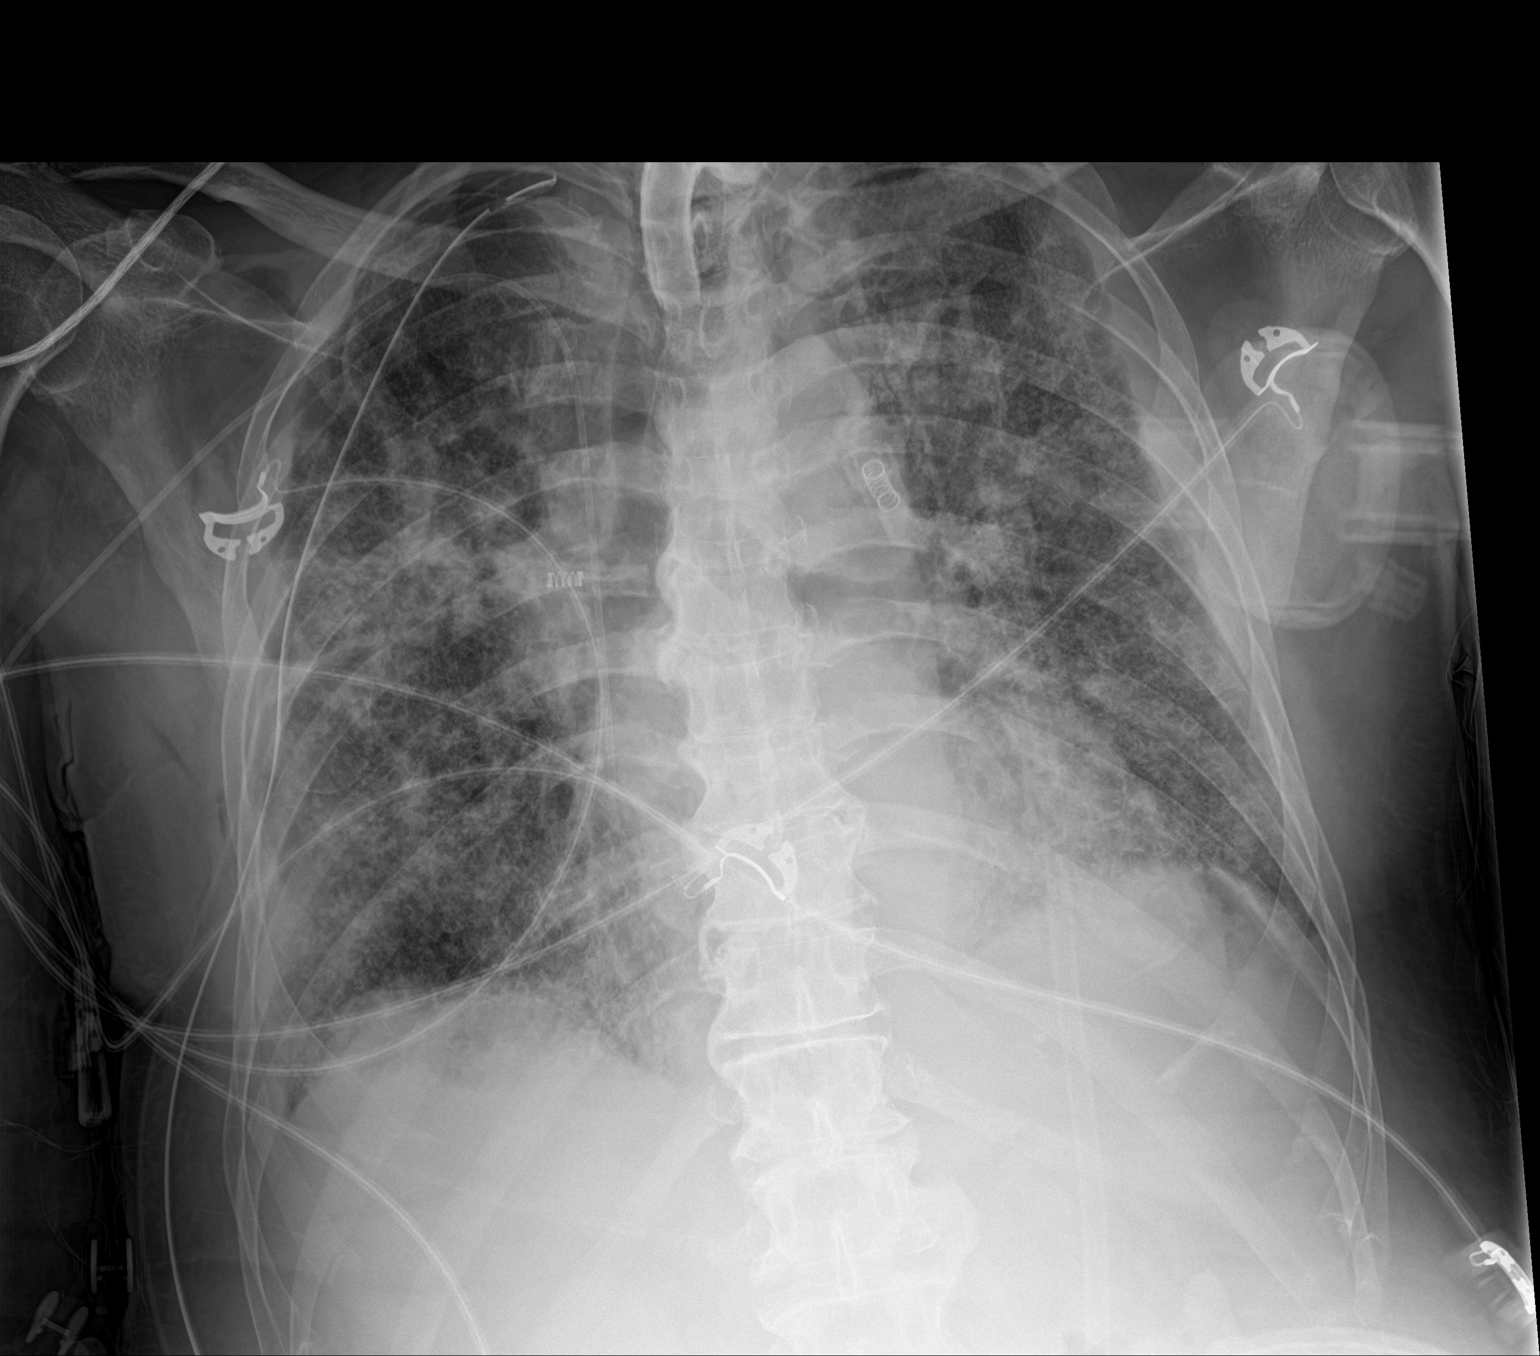

[1 of 1 positions shown; findings below may reference images not displayed]

FINDINGS: The small right apical pneumothorax is mildly larger laterally. A
right-sided chest tube is stable. The right PICC line terminates in
the central SVC. The tracheostomy tube is stable. Bilateral
pulmonary opacities are similar to mildly improved in the interval.
No other interval changes.
IMPRESSION: 1. The small right apical pneumothorax is a little larger in the
interval laterally. The right chest tube is stable. Other support
apparatus as above.
2. Bilateral pulmonary opacities are similar to mildly improved in
the interval.

## 2020-11-08 IMAGING — DX DG CHEST 1V PORT
1 series · 1 of 1 positions shown · non-contrast
Comparison: March 13, 2020

CLINICAL DATA: Respiratory failure

EXAM:
PORTABLE CHEST 1 VIEW

[chest ap]
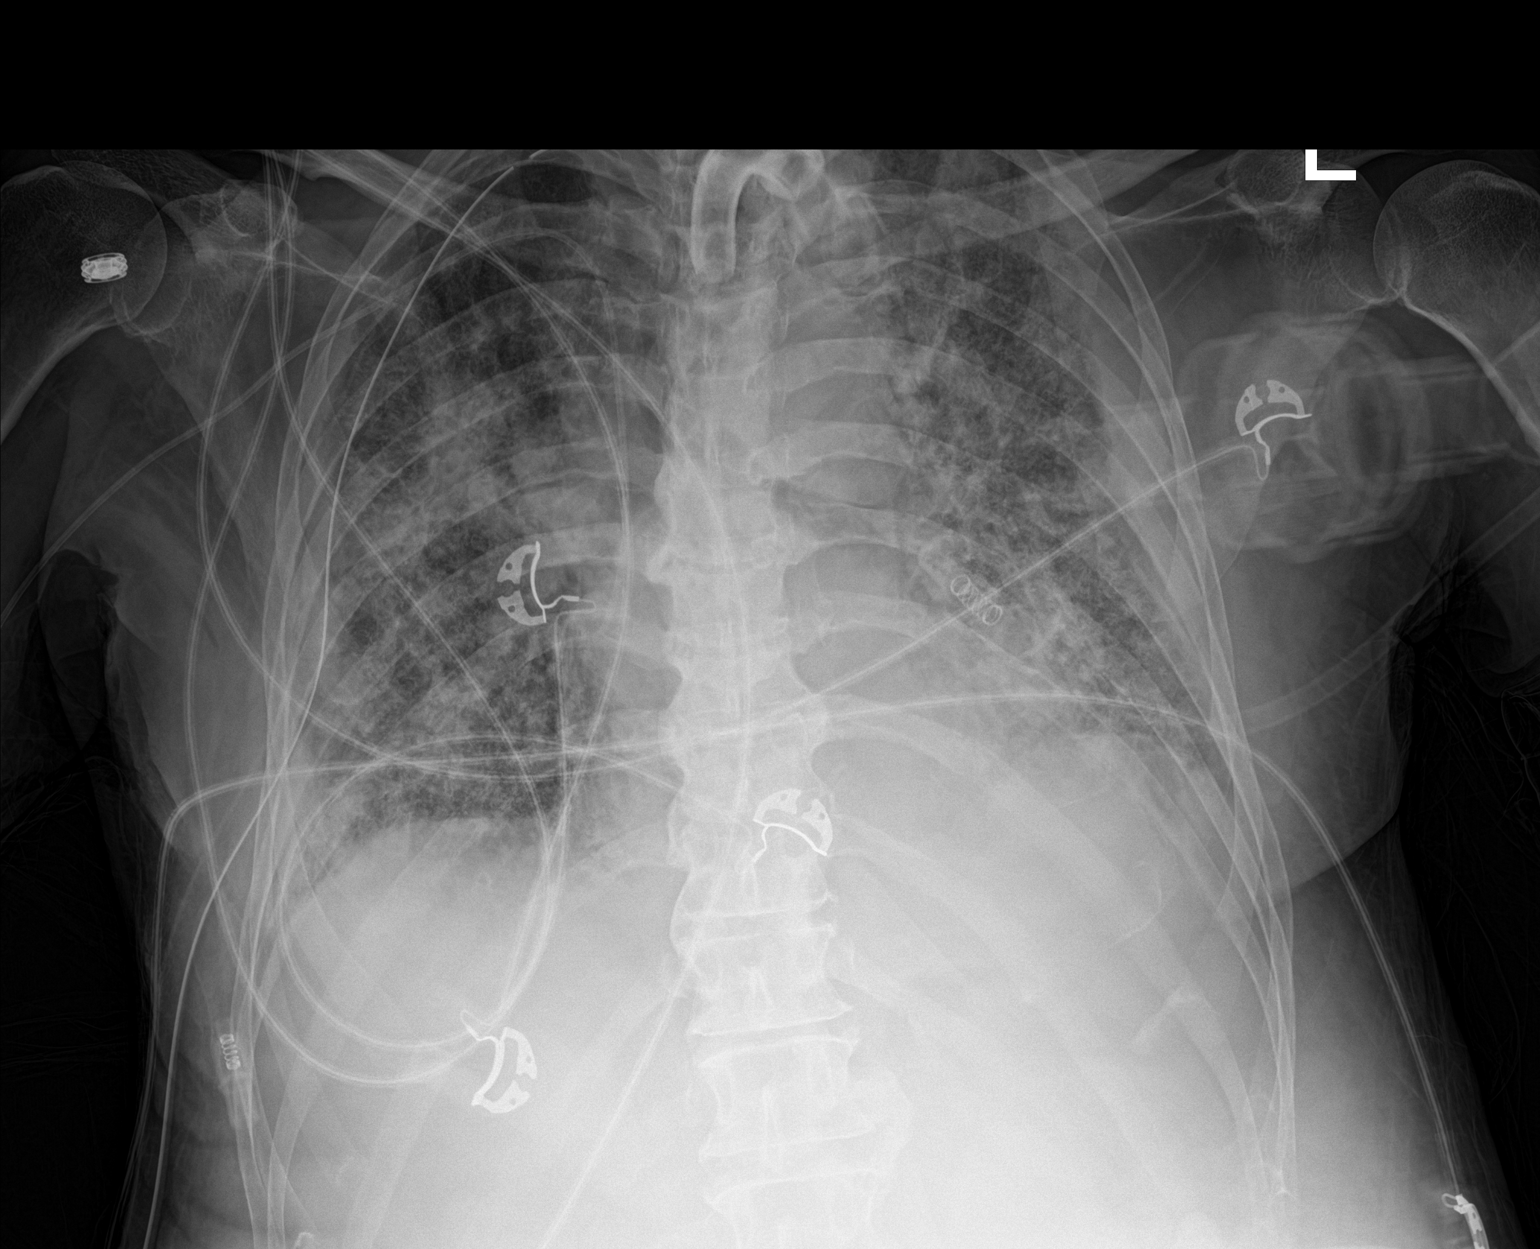

[1 of 1 positions shown; findings below may reference images not displayed]

FINDINGS: The right-sided chest tube is stable as is the tracheostomy tube.
Stable right PICC line. A small right-sided pneumothorax remains,
slightly smaller in the interval. Diffuse bilateral pulmonary
opacities remain. No other interval changes.
IMPRESSION: 1. Support apparatus as above. Continued small right pneumothorax,
slightly less prominent the interval.
2. Diffuse bilateral pulmonary opacities are stable.
